# Patient Record
Sex: Female | Born: 1945 | Race: White | Hispanic: No | Marital: Single | Smoking: Former smoker
Health system: Southern US, Community
[De-identification: ages and names within clinical notes are randomized; demographics above are authoritative.]

## PROBLEM LIST (undated history)

## (undated) DIAGNOSIS — E119 Type 2 diabetes mellitus without complications: Secondary | ICD-10-CM

## (undated) DIAGNOSIS — D3500 Benign neoplasm of unspecified adrenal gland: Secondary | ICD-10-CM

## (undated) DIAGNOSIS — G8929 Other chronic pain: Secondary | ICD-10-CM

## (undated) DIAGNOSIS — I2510 Atherosclerotic heart disease of native coronary artery without angina pectoris: Secondary | ICD-10-CM

## (undated) DIAGNOSIS — G2581 Restless legs syndrome: Secondary | ICD-10-CM

## (undated) DIAGNOSIS — I878 Other specified disorders of veins: Secondary | ICD-10-CM

## (undated) DIAGNOSIS — I10 Essential (primary) hypertension: Secondary | ICD-10-CM

## (undated) DIAGNOSIS — E1169 Type 2 diabetes mellitus with other specified complication: Secondary | ICD-10-CM

## (undated) DIAGNOSIS — N1832 Chronic kidney disease, stage 3b: Secondary | ICD-10-CM

## (undated) DIAGNOSIS — I5032 Chronic diastolic (congestive) heart failure: Secondary | ICD-10-CM

## (undated) DIAGNOSIS — G4733 Obstructive sleep apnea (adult) (pediatric): Secondary | ICD-10-CM

## (undated) DIAGNOSIS — D509 Iron deficiency anemia, unspecified: Secondary | ICD-10-CM

## (undated) DIAGNOSIS — D638 Anemia in other chronic diseases classified elsewhere: Secondary | ICD-10-CM

## (undated) DIAGNOSIS — I509 Heart failure, unspecified: Secondary | ICD-10-CM

## (undated) DIAGNOSIS — E785 Hyperlipidemia, unspecified: Secondary | ICD-10-CM

## (undated) DIAGNOSIS — I4819 Other persistent atrial fibrillation: Secondary | ICD-10-CM

## (undated) HISTORY — PX: CARDIAC CATHETERIZATION: SHX172

## (undated) HISTORY — DX: Essential (primary) hypertension: I10

## (undated) HISTORY — PX: OTHER SURGICAL HISTORY: SHX169

## (undated) HISTORY — DX: Obstructive sleep apnea (adult) (pediatric): G47.33

## (undated) HISTORY — PX: TONSILLECTOMY: SUR1361

## (undated) HISTORY — DX: Hyperlipidemia, unspecified: E78.5

## (undated) HISTORY — DX: Type 2 diabetes mellitus without complications: E11.9

## (undated) HISTORY — DX: Atherosclerotic heart disease of native coronary artery without angina pectoris: I25.10

---

## 2006-05-31 DIAGNOSIS — R079 Chest pain, unspecified: Secondary | ICD-10-CM | POA: Insufficient documentation

## 2010-12-03 DIAGNOSIS — E785 Hyperlipidemia, unspecified: Secondary | ICD-10-CM | POA: Insufficient documentation

## 2010-12-03 DIAGNOSIS — R32 Unspecified urinary incontinence: Secondary | ICD-10-CM | POA: Insufficient documentation

## 2010-12-03 DIAGNOSIS — I10 Essential (primary) hypertension: Secondary | ICD-10-CM | POA: Insufficient documentation

## 2010-12-05 DIAGNOSIS — I2510 Atherosclerotic heart disease of native coronary artery without angina pectoris: Secondary | ICD-10-CM | POA: Insufficient documentation

## 2011-12-28 DIAGNOSIS — R42 Dizziness and giddiness: Secondary | ICD-10-CM | POA: Insufficient documentation

## 2014-03-05 DIAGNOSIS — E11649 Type 2 diabetes mellitus with hypoglycemia without coma: Secondary | ICD-10-CM | POA: Insufficient documentation

## 2014-03-05 DIAGNOSIS — E11610 Type 2 diabetes mellitus with diabetic neuropathic arthropathy: Secondary | ICD-10-CM | POA: Insufficient documentation

## 2017-03-09 DIAGNOSIS — I5033 Acute on chronic diastolic (congestive) heart failure: Secondary | ICD-10-CM | POA: Insufficient documentation

## 2017-03-09 DIAGNOSIS — I5032 Chronic diastolic (congestive) heart failure: Secondary | ICD-10-CM | POA: Insufficient documentation

## 2017-03-22 DIAGNOSIS — G459 Transient cerebral ischemic attack, unspecified: Secondary | ICD-10-CM | POA: Insufficient documentation

## 2017-04-06 DIAGNOSIS — L4050 Arthropathic psoriasis, unspecified: Secondary | ICD-10-CM | POA: Insufficient documentation

## 2019-06-19 DIAGNOSIS — R269 Unspecified abnormalities of gait and mobility: Secondary | ICD-10-CM | POA: Insufficient documentation

## 2019-06-20 DIAGNOSIS — M159 Polyosteoarthritis, unspecified: Secondary | ICD-10-CM | POA: Insufficient documentation

## 2019-06-20 DIAGNOSIS — M8949 Other hypertrophic osteoarthropathy, multiple sites: Secondary | ICD-10-CM | POA: Insufficient documentation

## 2019-08-14 NOTE — Progress Notes (Signed)
Cardiology Office Note:   Date:  08/16/2019  NAME:  [name]    MRN: [id number] DOB:  March 16, 1945   PCP:  Lajean Manes, MD  Cardiologist:  No primary care provider on file.   Referring MD: Lajean Manes, MD   Chief Complaint  Patient presents with  . Coronary Artery Disease   History of Present Illness:   [name] is a 74 y.o. female with a hx of CAD (RCA CTO), HTN, COPD, HLD, DM who is being seen today for the evaluation of CAD at the request of [place], Christiane Ha, MD.  She is recently relocated from [facility] to [place].  She apparently has found an assisted living facility that she can afford associated with the [facility] [place].  I did review her records extensively from [facility].  She has a history of a heart attack in the 90s and had angioplasty then.  She apparently is been followed by [facility] physicians and has been noted to have a CTO of the right coronary artery.  She has good left-to-right collateral flow on recent angiogram from 2015.  She has been relatively stable on medical therapy.  She is allergic to nitroglycerin per her report.  She remains on aspirin and her most recent LDL cholesterol from last year was within normal limits.  She reports over the last 2 to 3 weeks has had progressively worsening lower extremity edema and associated shortness of breath.  She apparently has had her Lasix increased to 40 mg twice daily by her primary care physician without resolution of symptoms.  This is not cutting it.  She also reports intermittent episodes of chest pain that occur infrequently.  She reports she gets a sharp pain in the center of her chest that can last seconds to minutes and resolves with Tylenol.  She reports that she is working with physical therapy 3 days/week.  This includes 1 hour sessions of walking and lifting weights.  She is able to do this without any major limitations.  It does appear that she has been a bit more short of breath with  lower extremity edema as of lately.  She is a former smoker.  CVD risk factors include diabetes, morbid obesity (BMI 57), OSA, hypertension, former tobacco abuse.  She also has a family history of heart disease in her father.  I did review her angiogram from 2015 which is a CTO of the RCA available through care everywhere.  She also had an echocardiogram with EF 50-55%.  Problem List 1. Pheochromocytoma s/p resection  2. CAD -angioplasty in 1992 -LHC 2015-> CTO of RCA with L to R collaterals -normal left system -Echo 07/19/2018: EF 50-55% 3. DM -A1c 6.4 4. HLD -T chol 158, TG 417, HDL 40, LDL 55 5. COPD 6. OSA on CPAP 7.  Obesity -BMI 79   Past Medical History: Past Medical History:  Diagnosis Date  . Coronary artery disease   . Diabetes mellitus without complication ([place])   . Hyperlipidemia   . Hypertension   . OSA (obstructive sleep apnea)     Past Surgical History: Past Surgical History:  Procedure Laterality Date  . CARDIAC CATHETERIZATION    . pheochromocytoma    . TONSILLECTOMY      Current Medications: Current Meds  Medication Sig  . acetaminophen (TYLENOL) 500 MG tablet Take 500 mg by mouth every 8 (eight) hours as needed.  Marland Kitchen albuterol (VENTOLIN HFA) 108 (90 Base) MCG/ACT inhaler Inhale 2 puffs into the lungs 4 (  four) times daily as needed for wheezing or shortness of breath.  Marland Kitchen amLODipine (NORVASC) 5 MG tablet Take 5 mg by mouth daily.  Marland Kitchen ascorbic acid (VITAMIN C) 1000 MG tablet Take 1,000 mg by mouth daily.  Marland Kitchen aspirin EC 81 MG tablet Take 81 mg by mouth daily. Swallow whole.  . budesonide-formoterol (SYMBICORT) 160-4.5 MCG/ACT inhaler Inhale 2 puffs into the lungs 2 (two) times daily.  . carvedilol (COREG) 25 MG tablet Take 25 mg by mouth 2 (two) times daily with a meal.  . Cholecalciferol (VITAMIN D3) 125 MCG (5000 UT) CAPS Take 5,000 Units by mouth.  . diclofenac Sodium (VOLTAREN) 1 % GEL Apply topically 4 (four) times daily. Apply 2 grams to affected area up  to two times a day  . fluocinonide (LIDEX) 0.05 % external solution Apply 1 application topically 2 (two) times daily. Apply to affected area twice a day  . folic acid (FOLVITE) 1 MG tablet Take 1 mg by mouth daily.  . furosemide (LASIX) 40 MG tablet Take 40 mg by mouth 2 (two) times daily.  Marland Kitchen glucose 4 GM chewable tablet Chew 3 tablets by mouth as needed for low blood sugar.  Marland Kitchen glucose blood test strip as directed. Dispense Blood Glucose Test Strips to patient for home use. Testing 4 times daily.  . insulin NPH-regular Human (70-30) 100 UNIT/ML injection Inject into the skin. Give 60 units subcutaneous twice daily with breakfast and lunch and 50 units at supper  . Insulin Syringe-Needle U-100 (INSULIN SYRINGE 1CC/31GX5/16") 31G X 5/16" 1 ML MISC USE TO INJECT INSULIN TWO TIMES A DAY  . Ixekizumab 80 MG/ML SOAJ Inject into the skin. Give subcutaneous every 4 weeks  . Lancets 33G MISC as directed. Testing 4 times daily.  Dispense Lancets to patient for home use.  . lisinopril (ZESTRIL) 10 MG tablet Take 10 mg by mouth daily.  . Magnesium 200 MG TABS Take 200 mg by mouth daily.  . meloxicam (MOBIC) 7.5 MG tablet Take 7.5 mg by mouth in the morning and at bedtime.  . methotrexate (RHEUMATREX) 2.5 MG tablet Take 15 mg by mouth once a week. Caution:Chemotherapy. Protect from light.  . Multiple Vitamin (MULTIVITAMIN ADULT PO) Take by mouth.  . OXYGEN As directed  . polyethylene glycol (MIRALAX / GLYCOLAX) 17 g packet Take 17 g by mouth daily.  . ranolazine (RANEXA) 1000 MG SR tablet Take 500 mg by mouth 2 (two) times daily.  Marland Kitchen Respiratory Therapy Supplies DEVI as directed. BIPAP 18/14 cm H2O with humidifier, mask, tubing, and headgear.  . rosuvastatin (CRESTOR) 20 MG tablet Take 20 mg by mouth daily.  Marland Kitchen tiotropium (SPIRIVA HANDIHALER) 18 MCG inhalation capsule Place 18 mcg into inhaler and inhale daily.  . Turmeric 500 MG CAPS Take by mouth.  . vitamin B-12 (CYANOCOBALAMIN) 1000 MCG tablet Take 1,000  mcg by mouth daily.  Marland Kitchen zinc sulfate 50 MG CAPS capsule Take 220 mg by mouth daily.     Allergies:    Metaxalone, Nitroglycerin, Tramadol-acetaminophen, Brompheniramine-pseudoeph, Meprobamate, Metformin, Methyldopa, Metoprolol, Procaine, and Pseudoephedrine hcl   Social History: Social History   Socioeconomic History  . Marital status: Widowed    Spouse name: Not on file  . Number of children: Not on file  . Years of education: Not on file  . Highest education level: Not on file  Occupational History  . Occupation: retired  Tobacco Use  . Smoking status: Former Smoker    Packs/day: 2.00    Years: 10.00  Pack years: 20.00    Quit date: 08/15/1989    Years since quitting: 30.0  . Smokeless tobacco: Never Used  Substance and Sexual Activity  . Alcohol use: Not Currently  . Drug use: Not Currently  . Sexual activity: Not on file  Other Topics Concern  . Not on file  Social History Narrative  . Not on file   Social Determinants of Health   Financial Resource Strain:   . Difficulty of Paying Living Expenses:   Food Insecurity:   . Worried About Charity fundraiser in the Last Year:   . Arboriculturist in the Last Year:   Transportation Needs:   . Film/video editor (Medical):   Marland Kitchen Lack of Transportation (Non-Medical):   Physical Activity:   . Days of Exercise per Week:   . Minutes of Exercise per Session:   Stress:   . Feeling of Stress :   Social Connections:   . Frequency of Communication with Friends and Family:   . Frequency of Social Gatherings with Friends and Family:   . Attends Religious Services:   . Active Member of Clubs or Organizations:   . Attends Archivist Meetings:   Marland Kitchen Marital Status:      Family History: The patient's family history includes Heart attack in her father and mother; Stroke in her mother.  ROS:   All other ROS reviewed and negative. Pertinent positives noted in the HPI.     EKGs/Labs/Other Studies Reviewed:   The  following studies were personally reviewed by me today:  EKG:  EKG is ordered today.  The ekg ordered today demonstrates normal sinus rhythm, heart rate 69, old anterior infarct, and was personally reviewed by me.   Recent Labs: No results found for requested labs within last 8760 hours.   Recent Lipid Panel No results found for: CHOL, TRIG, HDL, CHOLHDL, VLDL, LDLCALC, LDLDIRECT  Physical Exam:   VS:  BP (!) 132/74   Pulse 69   Ht 5\' 3"  (1.6 m)   Wt (!) 321 lb 12.8 oz (146 kg)   BMI 57.00 kg/m    Wt Readings from Last 3 Encounters:  08/16/19 (!) 321 lb 12.8 oz (146 kg)    General: Morbidly obese female, no acute distress Heart: Atraumatic, normal size  Eyes: PEERLA, EOMI  Neck: Supple, neck adiposity precludes JVD assessment Endocrine: No thryomegaly Cardiac: Normal S1, S2; RRR; no murmurs, rubs, or gallops Lungs: Clear to auscultation bilaterally, no wheezing, rhonchi or rales  Abd: Soft, nontender, no hepatomegaly  Ext: 2+ pitting edema up to knees Musculoskeletal: No deformities, BUE and BLE strength normal and equal Skin: Warm and dry, no rashes   Neuro: Alert and oriented to person, place, time, and situation, CNII-XII grossly intact, no focal deficits  Psych: Normal mood and affect   ASSESSMENT:   [name] is a 73 y.o. female who presents for the following: 1. Coronary artery disease involving native coronary artery of native heart without angina pectoris   2. Mixed hyperlipidemia   3. Essential hypertension   4. SOB (shortness of breath)   5. Leg edema     PLAN:   1. Coronary artery disease involving native coronary artery of native heart without angina pectoris -History of MI in the 90s status post angioplasty.  Most recent left heart catheterization in 2015 in [place] demonstrates RCA CTO with left-to-right collaterals.  This has been managed medically.  She presents with minimal symptoms of chest  pain.  She is intolerant to nitroglycerin.   We will continue her Ranexa 500 mg twice daily.  She is on aspirin 81 mg daily.  She is on Lipitor and most recent LDL cholesterol from last year showed a value of 55.  Continue this.  I suspect most of her symptoms are related to volume overload.  See discussion below.  2. Mixed hyperlipidemia -Continue statin.  3. Essential hypertension -Continue Coreg, lisinopril.  I will stop her amlodipine.  This will worsen her edema.  4. SOB (shortness of breath) 5. Leg edema -She has worsening lower extremity edema.  Will need repeat echocardiogram.  We will check a BNP as well.  She needs a CBC and CMP.  Highly suspect she has either diastolic heart failure or reduction in EF.  We will need to reevaluate with an echocardiogram.  I would also like to switch her over to torsemide.  She has more right heart failure symptoms and this will be better absorbed.  We will start her on 50 mg of torsemide daily.  We did discuss extensively salt reductive strategies.  She does live in an assisted living facility and is unable to take much of the food she eats.  Disposition: Return in about 3 weeks (around 09/06/2019).  Medication Adjustments/Labs and Tests Ordered: Current medicines are reviewed at length with the patient today.  Concerns regarding medicines are outlined above.  Orders Placed This Encounter  Procedures  . Brain natriuretic peptide  . Comprehensive metabolic panel  . CBC  . EKG 12-Lead  . ECHOCARDIOGRAM COMPLETE   Meds ordered this encounter  Medications  . torsemide (DEMADEX) 100 MG tablet    Sig: Take 0.5 tablets (50 mg total) by mouth daily.    Dispense:  60 tablet    Refill:  1    Patient Instructions  Medication Instructions:  Stop Lasix Stop Amlodipine  Start Torsemide 50 mg daily   *If you need a refill on your cardiac medications before your next appointment, please call your pharmacy*   Lab Work: BNP, CMET, CBC If you have labs (blood work) drawn today and your tests  are completely normal, you will receive your results only by: Marland Kitchen MyChart Message (if you have MyChart) OR . A paper copy in the mail If you have any lab test that is abnormal or we need to change your treatment, we will call you to review the results.   Testing/Procedures: Echocardiogram - Your physician has requested that you have an echocardiogram. Echocardiography is a painless test that uses sound waves to create images of your heart. It provides your doctor with information about the size and shape of your heart and how well your heart's chambers and valves are working. This procedure takes approximately one hour. There are no restrictions for this procedure. This will be performed at our [facility] location - [address].    Follow-Up: At [facility], you and your health needs are our priority.  As part of our continuing mission to provide you with exceptional heart care, we have created designated Provider Care Teams.  These Care Teams include your primary Cardiologist (physician) and Advanced Practice Providers (APPs -  Physician Assistants and Nurse Practitioners) who all work together to provide you with the care you need, when you need it.  We recommend signing up for the patient portal called "MyChart".  Sign up information is provided on this After Visit Summary.  MyChart is used to connect with  patients for Virtual Visits (Telemedicine).  Patients are able to view lab/test results, encounter notes, upcoming appointments, etc.  Non-urgent messages can be sent to your provider as well.   To learn more about what you can do with MyChart, go to [email or web address].    Your next appointment:   2-3 week(s)  The format for your next appointment:   In Person  Provider:   Eleonore Chiquito, MD           Signed, Addison Naegeli. Audie Box, [place]  [address], [place], [place] [address] [phone]  08/16/2019 2:13 PM

## 2019-08-16 ENCOUNTER — Ambulatory Visit: Payer: Medicare Other | Admitting: Cardiovascular Disease

## 2019-08-16 ENCOUNTER — Ambulatory Visit (INDEPENDENT_AMBULATORY_CARE_PROVIDER_SITE_OTHER): Payer: Medicare Other | Admitting: Cardiovascular Disease

## 2019-08-16 ENCOUNTER — Other Ambulatory Visit: Payer: Self-pay

## 2019-08-16 ENCOUNTER — Encounter: Payer: Self-pay | Admitting: Cardiovascular Disease

## 2019-08-16 VITALS — BP 132/74 | HR 69 | Ht 63.0 in | Wt 321.8 lb

## 2019-08-16 DIAGNOSIS — I10 Essential (primary) hypertension: Secondary | ICD-10-CM

## 2019-08-16 DIAGNOSIS — R600 Localized edema: Secondary | ICD-10-CM

## 2019-08-16 DIAGNOSIS — R0602 Shortness of breath: Secondary | ICD-10-CM

## 2019-08-16 DIAGNOSIS — I2510 Atherosclerotic heart disease of native coronary artery without angina pectoris: Secondary | ICD-10-CM | POA: Diagnosis not present

## 2019-08-16 DIAGNOSIS — E782 Mixed hyperlipidemia: Secondary | ICD-10-CM

## 2019-08-16 MED ORDER — TORSEMIDE 100 MG PO TABS
50.0000 mg | ORAL_TABLET | Freq: Every day | ORAL | 1 refills | Status: DC
Start: 2019-08-16 — End: 2020-01-29

## 2019-08-16 NOTE — Patient Instructions (Signed)
Medication Instructions:  Stop Lasix Stop Amlodipine  Start Torsemide 50 mg daily   *If you need a refill on your cardiac medications before your next appointment, please call your pharmacy*   Lab Work: BNP, CMET, CBC If you have labs (blood work) drawn today and your tests are completely normal, you will receive your results only by: Marland Kitchen MyChart Message (if you have MyChart) OR . A paper copy in the mail If you have any lab test that is abnormal or we need to change your treatment, we will call you to review the results.   Testing/Procedures: Echocardiogram - Your physician has requested that you have an echocardiogram. Echocardiography is a painless test that uses sound waves to create images of your heart. It provides your doctor with information about the size and shape of your heart and how well your heart's chambers and valves are working. This procedure takes approximately one hour. There are no restrictions for this procedure. This will be performed at our [facility] location - [address].    Follow-Up: At [facility], you and your health needs are our priority.  As part of our continuing mission to provide you with exceptional heart care, we have created designated Provider Care Teams.  These Care Teams include your primary Cardiologist (physician) and Advanced Practice Providers (APPs -  Physician Assistants and Nurse Practitioners) who all work together to provide you with the care you need, when you need it.  We recommend signing up for the patient portal called "MyChart".  Sign up information is provided on this After Visit Summary.  MyChart is used to connect with patients for Virtual Visits (Telemedicine).  Patients are able to view lab/test results, encounter notes, upcoming appointments, etc.  Non-urgent messages can be sent to your provider as well.   To learn more about what you can do with MyChart, go to [email or web address].    Your next  appointment:   2-3 week(s)  The format for your next appointment:   In Person  Provider:   Eleonore Chiquito, MD

## 2019-08-17 LAB — COMPREHENSIVE METABOLIC PANEL
ALT: 19 IU/L (ref 0–32)
AST: 22 IU/L (ref 0–40)
Albumin/Globulin Ratio: 2.1 (ref 1.2–2.2)
Albumin: 4 g/dL (ref 3.7–4.7)
Alkaline Phosphatase: 61 IU/L (ref 48–121)
BUN/Creatinine Ratio: 20 (ref 12–28)
BUN: 15 mg/dL (ref 8–27)
Bilirubin Total: 0.3 mg/dL (ref 0.0–1.2)
CO2: 29 mmol/L (ref 20–29)
Calcium: 8.9 mg/dL (ref 8.7–10.3)
Chloride: 100 mmol/L (ref 96–106)
Creatinine, Ser: 0.76 mg/dL (ref 0.57–1.00)
GFR calc Af Amer: 91 mL/min/{1.73_m2} (ref >59)
GFR calc non Af Amer: 79 mL/min/{1.73_m2} (ref >59)
Globulin, Total: 1.9 g/dL (ref 1.5–4.5)
Glucose: 158 mg/dL — ABNORMAL HIGH (ref 65–99)
Potassium: 4 mmol/L (ref 3.5–5.2)
Sodium: 142 mmol/L (ref 134–144)
Total Protein: 5.9 g/dL — ABNORMAL LOW (ref 6.0–8.5)

## 2019-08-17 LAB — CBC
Hematocrit: 33.6 % — ABNORMAL LOW (ref 34.0–46.6)
Hemoglobin: 11.1 g/dL (ref 11.1–15.9)
MCH: 31.2 pg (ref 26.6–33.0)
MCHC: 33 g/dL (ref 31.5–35.7)
MCV: 94 fL (ref 79–97)
Platelets: 180 10*3/uL (ref 150–450)
RBC: 3.56 x10E6/uL — ABNORMAL LOW (ref 3.77–5.28)
RDW: 16.6 % — ABNORMAL HIGH (ref 11.7–15.4)
WBC: 6.1 10*3/uL (ref 3.4–10.8)

## 2019-08-17 LAB — BRAIN NATRIURETIC PEPTIDE: BNP: 46.8 pg/mL (ref 0.0–100.0)

## 2019-08-20 ENCOUNTER — Encounter: Payer: Self-pay | Admitting: Cardiovascular Disease

## 2019-09-05 ENCOUNTER — Ambulatory Visit (HOSPITAL_COMMUNITY): Payer: Medicare Other | Attending: Cardiovascular Disease

## 2019-09-05 ENCOUNTER — Other Ambulatory Visit: Payer: Self-pay

## 2019-09-05 DIAGNOSIS — R0602 Shortness of breath: Secondary | ICD-10-CM

## 2019-09-05 LAB — ECHOCARDIOGRAM COMPLETE
Area-P 1/2: 2.56 cm2
S' Lateral: 3.5 cm

## 2019-09-05 MED ORDER — PERFLUTREN LIPID MICROSPHERE
1.0000 mL | INTRAVENOUS | Status: AC | PRN
  Administered 2019-09-05: 2 mL via INTRAVENOUS

## 2019-10-01 NOTE — Progress Notes (Signed)
Cardiology Office Note:   Date:  10/03/2019  NAME:  [name]    MRN: [id number] DOB:  03-Dec-1945   PCP:  Lajean Manes, MD  Cardiologist:  No primary care provider on file.  [facility] life has a midline is all at Referring MD: Lajean Manes, MD   Chief Complaint  Patient presents with  . Congestive Heart Failure   History of Present Illness:   [name] is a 74 y.o. female with a hx of CAD, DM, HLD, COPD, OSA who presents for follow-up of diastolic HF. Recently evaluated for SOB. Echo with G2DD and dilated IVC. Started on torsemide 50 mg QD. BNP normal but likely low in setting of obesity.   She reports that her shortness of breath and chest pains have improved with torsemide.  Her weight is down to 317 pounds from 321 her last visit.  She still has some lower extremity edema but symptoms appear to have been improved with torsemide.  She is not exercising that much.  She is mainly active only to do laundry or get around her apartment.  She lives in assisted living.  Her blood pressure is well controlled today.  She denies any chest pain.  She is not pursuing physical therapy at this time.  This is needed.  Weight loss also was encouraged.  She is not taking Lasix and on torsemide.  Appears to be doing much better than when I saw her last visit.  Her triglycerides are elevated.  I have recommended treatment for this.  She will have repeat labs by her primary care physician in several weeks.  We do need to make sure they are fasting.  She overall still has some lower extreme edema but symptoms appear to be improving.  Problem List 1. Pheochromocytoma s/p resection  2. CAD -angioplasty in 1992 -[facility] 2015-> CTO of RCA with L to R collaterals -normal left system -Echo 07/19/2018: EF 50-55% -intolerant of NTG; on ranexa  3. DM -A1c 6.4 4. HLD -T chol 158, TG 417, HDL 40, LDL 55 5. COPD 6. OSA on CPAP 7.  Obesity -BMI 57 8. HFpEF -EF 50-55% with G2DD  Past Medical History: Past  Medical History:  Diagnosis Date  . Coronary artery disease   . Diabetes mellitus without complication ([place])   . Hyperlipidemia   . Hypertension   . OSA (obstructive sleep apnea)     Past Surgical History: Past Surgical History:  Procedure Laterality Date  . CARDIAC CATHETERIZATION    . pheochromocytoma    . TONSILLECTOMY      Current Medications: Current Meds  Medication Sig  . acetaminophen (TYLENOL) 500 MG tablet Take 500 mg by mouth every 8 (eight) hours as needed.  Marland Kitchen albuterol (VENTOLIN HFA) 108 (90 Base) MCG/ACT inhaler Inhale 2 puffs into the lungs 4 (four) times daily as needed for wheezing or shortness of breath.  Marland Kitchen ascorbic acid (VITAMIN C) 1000 MG tablet Take 1,000 mg by mouth daily.  Marland Kitchen aspirin EC 81 MG tablet Take 81 mg by mouth daily. Swallow whole.  . budesonide-formoterol (SYMBICORT) 160-4.5 MCG/ACT inhaler Inhale 2 puffs into the lungs 2 (two) times daily.  . carvedilol (COREG) 25 MG tablet Take 25 mg by mouth 2 (two) times daily with a meal.  . Cholecalciferol (VITAMIN D3) 125 MCG (5000 UT) CAPS Take 5,000 Units by mouth.  . diclofenac Sodium (VOLTAREN) 1 % GEL Apply topically 4 (four) times daily. Apply 2 grams to affected area up to two times a  day  . fluocinonide (LIDEX) 0.05 % external solution Apply 1 application topically 2 (two) times daily. Apply to affected area twice a day  . folic acid (FOLVITE) 1 MG tablet Take 1 mg by mouth daily.  Marland Kitchen glucose 4 GM chewable tablet Chew 3 tablets by mouth as needed for low blood sugar.  Marland Kitchen glucose blood test strip as directed. Dispense Blood Glucose Test Strips to patient for home use. Testing 4 times daily.  . insulin NPH-regular Human (70-30) 100 UNIT/ML injection Inject into the skin. Give 60 units subcutaneous twice daily with breakfast and lunch and 50 units at supper  . Insulin Syringe-Needle U-100 (INSULIN SYRINGE 1CC/31GX5/16") 31G X 5/16" 1 ML MISC USE TO INJECT INSULIN TWO TIMES A DAY  . Ixekizumab 80 MG/ML SOAJ  Inject into the skin. Give subcutaneous every 4 weeks  . Lancets 33G MISC as directed. Testing 4 times daily.  Dispense Lancets to patient for home use.  . lisinopril (ZESTRIL) 10 MG tablet Take 10 mg by mouth daily.  . Magnesium 200 MG TABS Take 200 mg by mouth daily.  . meloxicam (MOBIC) 7.5 MG tablet Take 7.5 mg by mouth in the morning and at bedtime.  . Multiple Vitamin (MULTIVITAMIN ADULT PO) Take by mouth.  . polyethylene glycol (MIRALAX / GLYCOLAX) 17 g packet Take 17 g by mouth daily.  . ranolazine (RANEXA) 1000 MG SR tablet Take 500 mg by mouth 2 (two) times daily.  Marland Kitchen Respiratory Therapy Supplies DEVI as directed. BIPAP 18/14 cm H2O with humidifier, mask, tubing, and headgear.  . rosuvastatin (CRESTOR) 20 MG tablet Take 20 mg by mouth daily.  Marland Kitchen tiotropium (SPIRIVA HANDIHALER) 18 MCG inhalation capsule Place 18 mcg into inhaler and inhale daily.  Marland Kitchen torsemide (DEMADEX) 100 MG tablet Take 0.5 tablets (50 mg total) by mouth daily.  . Turmeric 500 MG CAPS Take by mouth.  . vitamin B-12 (CYANOCOBALAMIN) 1000 MCG tablet Take 1,000 mcg by mouth daily.  Marland Kitchen zinc sulfate 50 MG CAPS capsule Take 220 mg by mouth daily.     Allergies:    Metaxalone, Nitroglycerin, Tramadol-acetaminophen, Brompheniramine-pseudoeph, Meprobamate, Metformin, Methyldopa, Metoprolol, Procaine, and Pseudoephedrine hcl   Social History: Social History   Socioeconomic History  . Marital status: Widowed    Spouse name: Not on file  . Number of children: Not on file  . Years of education: Not on file  . Highest education level: Not on file  Occupational History  . Occupation: retired  Tobacco Use  . Smoking status: Former Smoker    Packs/day: 2.00    Years: 10.00    Pack years: 20.00    Quit date: 08/15/1989    Years since quitting: 30.1  . Smokeless tobacco: Never Used  Substance and Sexual Activity  . Alcohol use: Not Currently  . Drug use: Not Currently  . Sexual activity: Not on file  Other Topics  Concern  . Not on file  Social History Narrative  . Not on file   Social Determinants of Health   Financial Resource Strain:   . Difficulty of Paying Living Expenses: Not on file  Food Insecurity:   . Worried About Charity fundraiser in the Last Year: Not on file  . Ran Out of Food in the Last Year: Not on file  Transportation Needs:   . Lack of Transportation (Medical): Not on file  . Lack of Transportation (Non-Medical): Not on file  Physical Activity:   . Days of Exercise per Week: Not on  file  . Minutes of Exercise per Session: Not on file  Stress:   . Feeling of Stress : Not on file  Social Connections:   . Frequency of Communication with Friends and Family: Not on file  . Frequency of Social Gatherings with Friends and Family: Not on file  . Attends Religious Services: Not on file  . Active Member of Clubs or Organizations: Not on file  . Attends Archivist Meetings: Not on file  . Marital Status: Not on file     Family History: The patient's family history includes Heart attack in her father and mother; Stroke in her mother.  ROS:   All other ROS reviewed and negative. Pertinent positives noted in the HPI.     EKGs/Labs/Other Studies Reviewed:   The following studies were personally reviewed by me today:  TTE 09/05/2019 1. Hypokinesis of the mid to apical inferior and inferolateral  myocardium. Left ventricular ejection fraction, by estimation, is 50 to  55%. The left ventricle has low normal function. The left ventricle has no  regional wall motion abnormalities. Left  ventricular diastolic parameters are consistent with Grade II diastolic  dysfunction (pseudonormalization). Elevated left ventricular end-diastolic  pressure.  2. Right ventricular systolic function is normal. The right ventricular  size is normal.  3. The mitral valve is normal in structure. Trivial mitral valve  regurgitation. No evidence of mitral stenosis.  4. The aortic valve  was not well visualized. Aortic valve regurgitation  is trivial. No aortic stenosis is present.  5. The inferior vena cava is dilated in size with <50% respiratory  variability, suggesting right atrial pressure of 15 mmHg.   Recent Labs: 08/16/2019: ALT 19; BNP 46.8; BUN 15; Creatinine, Ser 0.76; Hemoglobin 11.1; Platelets 180; Potassium 4.0; Sodium 142   Recent Lipid Panel No results found for: CHOL, TRIG, HDL, CHOLHDL, VLDL, LDLCALC, LDLDIRECT  Physical Exam:   VS:  BP 120/66   Pulse 74   Ht 5\' 3"  (1.6 m)   Wt (!) 317 lb (143.8 kg)   BMI 56.15 kg/m    Wt Readings from Last 3 Encounters:  10/03/19 (!) 317 lb (143.8 kg)  08/16/19 (!) 321 lb 12.8 oz (146 kg)    General: Well nourished, well developed, in no acute distress Heart: Atraumatic, normal size  Eyes: PEERLA, EOMI  Neck: Supple, no JVD Endocrine: No thryomegaly Cardiac: Normal S1, S2; RRR; no murmurs, rubs, or gallops Lungs: Clear to auscultation bilaterally, no wheezing, rhonchi or rales  Abd: Soft, nontender, no hepatomegaly  Ext: 1+ pitting edema, chronic venous insufficiency changes noted Musculoskeletal: No deformities, BUE and BLE strength normal and equal Skin: Warm and dry, no rashes   Neuro: Alert and oriented to person, place, time, and situation, CNII-XII grossly intact, no focal deficits  Psych: Normal mood and affect   ASSESSMENT:   [name] is a 73 y.o. female who presents for the following: 1. Chronic diastolic heart failure ([place])   2. Coronary artery disease involving native coronary artery of native heart without angina pectoris   3. Mixed hyperlipidemia   4. Essential hypertension     PLAN:   1. Chronic diastolic heart failure ([facility]) -Recently seen with symptoms of diastolic heart failure.  Echo does confirm this.  Symptoms have improved on torsemide 50 mg daily.  Weights are down 4 pounds.  I would recommend continue torsemide 50 mg daily. -We did talk about exercise importance as well as  the importance of proper diet.  She  should avoid any salt.  This is problematic at her facility.  She will do what she can. -Other risk factors seem to be well controlled.  2. Coronary artery disease involving native coronary artery of native heart without angina pectoris -She has a CTO of the RCA.  Most recent heart cath in 2015 at [facility]  She is intolerant of nitroglycerin.  She did have some chest pain when I saw her last but I suspect this was volume overload related.  Symptoms have improved with diuresis.  She will continue Ranexa and diuresis.  She is also on Norvasc and other antianginals.  She is intolerant of statins.  3. Mixed hyperlipidemia -Elevated triglycerides.  We will start Vascepa 2 g twice daily.  She will continue Crestor 20 mg daily.  She will have her labs checked by her primary care physician.  I will see her back in 6 months we will reassess.  4. Essential hypertension -Well-controlled.  Continue amlodipine, carvedilol.   Disposition: Return in about 6 months (around 04/01/2020).  Medication Adjustments/Labs and Tests Ordered: Current medicines are reviewed at length with the patient today.  Concerns regarding medicines are outlined above.  No orders of the defined types were placed in this encounter.  Meds ordered this encounter  Medications  . icosapent Ethyl (VASCEPA) 1 g capsule    Sig: Take 2 capsules (2 g total) by mouth 2 (two) times daily.    Dispense:  360 capsule    Refill:  1    Patient Instructions  Medication Instructions:  Start Vascepa 2 g twice daily   *If you need a refill on your cardiac medications before your next appointment, please call your pharmacy*   Follow-Up: At [facility], you and your health needs are our priority.  As part of our continuing mission to provide you with exceptional heart care, we have created designated Provider Care Teams.  These Care Teams include your primary Cardiologist (physician) and  Advanced Practice Providers (APPs -  Physician Assistants and Nurse Practitioners) who all work together to provide you with the care you need, when you need it.  We recommend signing up for the patient portal called "MyChart".  Sign up information is provided on this After Visit Summary.  MyChart is used to connect with patients for Virtual Visits (Telemedicine).  Patients are able to view lab/test results, encounter notes, upcoming appointments, etc.  Non-urgent messages can be sent to your provider as well.   To learn more about what you can do with MyChart, go to [email or web address].    Your next appointment:   6 month(s)  The format for your next appointment:   In Person  Provider:   Eleonore Chiquito, MD        Time Spent with Patient: I have spent a total of 35 minutes with patient reviewing hospital notes, telemetry, EKGs, labs and examining the patient as well as establishing an assessment and plan that was discussed with the patient.  > 50% of time was spent in direct patient care.  Signed, Addison Naegeli. Audie Box, [place]  [address] [place], [place] [address] [phone]  10/03/2019 11:25 AM

## 2019-10-03 ENCOUNTER — Encounter: Payer: Self-pay | Admitting: Cardiovascular Disease

## 2019-10-03 ENCOUNTER — Other Ambulatory Visit: Payer: Self-pay

## 2019-10-03 ENCOUNTER — Ambulatory Visit (INDEPENDENT_AMBULATORY_CARE_PROVIDER_SITE_OTHER): Payer: Medicare Other | Admitting: Cardiovascular Disease

## 2019-10-03 VITALS — BP 120/66 | HR 74 | Ht 63.0 in | Wt 317.0 lb

## 2019-10-03 DIAGNOSIS — I2510 Atherosclerotic heart disease of native coronary artery without angina pectoris: Secondary | ICD-10-CM

## 2019-10-03 DIAGNOSIS — I10 Essential (primary) hypertension: Secondary | ICD-10-CM | POA: Diagnosis not present

## 2019-10-03 DIAGNOSIS — I5032 Chronic diastolic (congestive) heart failure: Secondary | ICD-10-CM | POA: Diagnosis not present

## 2019-10-03 DIAGNOSIS — E782 Mixed hyperlipidemia: Secondary | ICD-10-CM | POA: Diagnosis not present

## 2019-10-03 MED ORDER — ICOSAPENT ETHYL 1 G PO CAPS
2.0000 g | ORAL_CAPSULE | Freq: Two times a day (BID) | ORAL | 1 refills | Status: DC
Start: 2019-10-03 — End: 2019-12-07

## 2019-10-03 NOTE — Patient Instructions (Signed)
Medication Instructions:  Start Vascepa 2 g twice daily   *If you need a refill on your cardiac medications before your next appointment, please call your pharmacy*   Follow-Up: At [facility], you and your health needs are our priority.  As part of our continuing mission to provide you with exceptional heart care, we have created designated Provider Care Teams.  These Care Teams include your primary Cardiologist (physician) and Advanced Practice Providers (APPs -  Physician Assistants and Nurse Practitioners) who all work together to provide you with the care you need, when you need it.  We recommend signing up for the patient portal called "MyChart".  Sign up information is provided on this After Visit Summary.  MyChart is used to connect with patients for Virtual Visits (Telemedicine).  Patients are able to view lab/test results, encounter notes, upcoming appointments, etc.  Non-urgent messages can be sent to your provider as well.   To learn more about what you can do with MyChart, go to [email or web address].    Your next appointment:   6 month(s)  The format for your next appointment:   In Person  Provider:   Eleonore Chiquito, MD

## 2019-11-30 ENCOUNTER — Ambulatory Visit: Payer: Medicare Other | Admitting: Podiatry

## 2019-12-07 ENCOUNTER — Ambulatory Visit (INDEPENDENT_AMBULATORY_CARE_PROVIDER_SITE_OTHER): Payer: Medicare Other | Admitting: Podiatry

## 2019-12-07 ENCOUNTER — Encounter: Payer: Self-pay | Admitting: Podiatry

## 2019-12-07 ENCOUNTER — Other Ambulatory Visit: Payer: Self-pay

## 2019-12-07 DIAGNOSIS — M79674 Pain in right toe(s): Secondary | ICD-10-CM

## 2019-12-07 DIAGNOSIS — B351 Tinea unguium: Secondary | ICD-10-CM | POA: Diagnosis not present

## 2019-12-07 DIAGNOSIS — M5010 Cervical disc disorder with radiculopathy, unspecified cervical region: Secondary | ICD-10-CM | POA: Insufficient documentation

## 2019-12-07 DIAGNOSIS — Z86018 Personal history of other benign neoplasm: Secondary | ICD-10-CM | POA: Insufficient documentation

## 2019-12-07 DIAGNOSIS — E1142 Type 2 diabetes mellitus with diabetic polyneuropathy: Secondary | ICD-10-CM | POA: Diagnosis not present

## 2019-12-07 DIAGNOSIS — G4733 Obstructive sleep apnea (adult) (pediatric): Secondary | ICD-10-CM | POA: Insufficient documentation

## 2019-12-07 DIAGNOSIS — M79675 Pain in left toe(s): Secondary | ICD-10-CM

## 2019-12-07 DIAGNOSIS — I2510 Atherosclerotic heart disease of native coronary artery without angina pectoris: Secondary | ICD-10-CM

## 2019-12-07 DIAGNOSIS — H5089 Other specified strabismus: Secondary | ICD-10-CM | POA: Insufficient documentation

## 2019-12-07 NOTE — Patient Instructions (Addendum)
Moisturize feet once daily; do not apply between toes: A.  Aquaphor Healing Ointment B.  Vaseline Intensive Care Lotion C.  Lubriderm Lotion D.  Gold Bond Diabetic Foot Lotion E.  Eucerin Intensive Repair Moisturizing Lotion  If you have problems reaching your feet:  A.  Aquaphor Advanced Therapy Ointment Body Spray B.  Vaseline Intensive Care Spray Lotion Advanced Repair      Diabetes Mellitus and Foot Care Foot care is an important part of your health, especially when you have diabetes. Diabetes may cause you to have problems because of poor blood flow (circulation) to your feet and legs, which can cause your skin to:  Become thinner and drier.  Break more easily.  Heal more slowly.  Peel and crack. You may also have nerve damage (neuropathy) in your legs and feet, causing decreased feeling in them. This means that you may not notice minor injuries to your feet that could lead to more serious problems. Noticing and addressing any potential problems early is the best way to prevent future foot problems. How to care for your feet Foot hygiene  Wash your feet daily with warm water and mild soap. Do not use hot water. Then, pat your feet and the areas between your toes until they are completely dry. Do not soak your feet as this can dry your skin.  Trim your toenails straight across. Do not dig under them or around the cuticle. File the edges of your nails with an emery board or nail file.  Apply a moisturizing lotion or petroleum jelly to the skin on your feet and to dry, brittle toenails. Use lotion that does not contain alcohol and is unscented. Do not apply lotion between your toes. Shoes and socks  Wear clean socks or stockings every day. Make sure they are not too tight. Do not wear knee-high stockings since they may decrease blood flow to your legs.  Wear shoes that fit properly and have enough cushioning. Always look in your shoes before you put them on to be sure there  are no objects inside.  To break in new shoes, wear them for just a few hours a day. This prevents injuries on your feet. Wounds, scrapes, corns, and calluses  Check your feet daily for blisters, cuts, bruises, sores, and redness. If you cannot see the bottom of your feet, use a mirror or ask someone for help.  Do not cut corns or calluses or try to remove them with medicine.  If you find a minor scrape, cut, or break in the skin on your feet, keep it and the skin around it clean and dry. You may clean these areas with mild soap and water. Do not clean the area with peroxide, alcohol, or iodine.  If you have a wound, scrape, corn, or callus on your foot, look at it several times a day to make sure it is healing and not infected. Check for: ? Redness, swelling, or pain. ? Fluid or blood. ? Warmth. ? Pus or a bad smell. General instructions  Do not cross your legs. This may decrease blood flow to your feet.  Do not use heating pads or hot water bottles on your feet. They may burn your skin. If you have lost feeling in your feet or legs, you may not know this is happening until it is too late.  Protect your feet from hot and cold by wearing shoes, such as at the beach or on hot pavement.  Schedule a complete foot   exam at least once a year (annually) or more often if you have foot problems. If you have foot problems, report any cuts, sores, or bruises to your health care provider immediately. Contact a health care provider if:  You have a medical condition that increases your risk of infection and you have any cuts, sores, or bruises on your feet.  You have an injury that is not healing.  You have redness on your legs or feet.  You feel burning or tingling in your legs or feet.  You have pain or cramps in your legs and feet.  Your legs or feet are numb.  Your feet always feel cold.  You have pain around a toenail. Get help right away if:  You have a wound, scrape, corn, or  callus on your foot and: ? You have pain, swelling, or redness that gets worse. ? You have fluid or blood coming from the wound, scrape, corn, or callus. ? Your wound, scrape, corn, or callus feels warm to the touch. ? You have pus or a bad smell coming from the wound, scrape, corn, or callus. ? You have a fever. ? You have a red line going up your leg. Summary  Check your feet every day for cuts, sores, red spots, swelling, and blisters.  Moisturize feet and legs daily.  Wear shoes that fit properly and have enough cushioning.  If you have foot problems, report any cuts, sores, or bruises to your health care provider immediately.  Schedule a complete foot exam at least once a year (annually) or more often if you have foot problems. This information is not intended to replace advice given to you by your health care provider. Make sure you discuss any questions you have with your health care provider. Document Revised: 09/27/2018 Document Reviewed: 02/06/2016 Elsevier Patient Education  2020 Elsevier Inc.  

## 2019-12-10 ENCOUNTER — Ambulatory Visit: Payer: Medicare Other | Admitting: Podiatry

## 2019-12-10 NOTE — Progress Notes (Signed)
  Subjective:  Patient ID: [name], female    DOB: 1945/02/07,  MRN: [id number]  Chief Complaint  Patient presents with  . Nail Problem    thick painful toenails  . Diabetes    diabetic foot exam  . Peripheral Neuropathy    74 y.o. female presents with the above complaint. History confirmed with patient.  Her A1c is 7.0%.  She has dry skin as well  Objective:  Physical Exam: warm, good capillary refill, no trophic changes or ulcerative lesions and normal DP and PT pulses.  Loss of protective sensation noted in toes bilaterally with abnormal monofilament test.  Onychomycosis is noted Assessment:  No diagnosis found.   Plan:  Patient was evaluated and treated and all questions answered.  Patient educated on diabetes. Discussed proper diabetic foot care and discussed risks and complications of disease. Educated patient in depth on reasons to return to the office immediately should he/she discover anything concerning or new on the feet. All questions answered. Discussed proper shoes as well.  Discussed the etiology and treatment options for the condition in detail with the patient. Educated patient on the topical and oral treatment options for mycotic nails. Recommended debridement of the nails today. Sharp and mechanical debridement performed of all painful and mycotic nails today. Nails debrided in length and thickness using a nail nipper and a mechanical burr to level of comfort. Discussed treatment options including appropriate shoe gear. Follow up as needed for painful nails.    Return in about 3 months (around 03/08/2020) for diabetic nail trim.

## 2019-12-27 ENCOUNTER — Telehealth: Payer: Self-pay | Admitting: Cardiovascular Disease

## 2019-12-27 NOTE — Telephone Encounter (Signed)
Patient reports chronic shortness of breath however has worsened in the past 4-5 days.  She reported approximately 4 to 6 pound weight gain recently but does not check her weight every single day.  She states that she does not have a scale at home.  She reported weight gain based on last office visit with PCP.  She does report lower extremity edema and some orthopnea.  Advised to take additional torsemide 50 mg this afternoon to see response and give [place] a call tomorrow.  Offered office visit with Dr. Audie Box  this afternoon at 3:20 PM however patient states that she cannot make up in short notice.  She needs transportation arrangement.  She was appreciative of call.

## 2019-12-27 NOTE — Telephone Encounter (Signed)
    Pt c/o swelling: STAT is pt has developed SOB within 24 hours  1) How much weight have you gained and in what time span?   2) If swelling, where is the swelling located? Legs and face  3) Are you currently taking a fluid pill? Yes  4) Are you currently SOB? Yes  5) Do you have a log of your daily weights (if so, list)?   6) Have you gained 3 pounds in a day or 5 pounds in a week?   7) Have you traveled recently? No   Pt said she is retaining fluid, she has swelling on her legs and face. She also said she gets SOB and minor CP, she would like to know if she needs to increase her fluid pill

## 2019-12-27 NOTE — Telephone Encounter (Signed)
Thanks Vin. I agree.   [name] T. Audie Box, [place]  [address] [place], [place] [address] [phone]  3:14 PM

## 2019-12-28 ENCOUNTER — Telehealth: Payer: Self-pay | Admitting: Cardiovascular Disease

## 2019-12-28 DIAGNOSIS — Z79899 Other long term (current) drug therapy: Secondary | ICD-10-CM

## 2019-12-28 NOTE — Telephone Encounter (Signed)
Follow Up:     Pt said she need to know what to do about her Torsemide please? She says the swelling is a lot better, but still have some. She said the chest pains are gone completely.

## 2019-12-28 NOTE — Telephone Encounter (Signed)
Spoke with patient. Advised patient to continue taking extra 50mg  of Torsemide today, and tomorrow and resume normal dosing of 50mg  daily on Sunday. Advised patient that she will need repeat blood work to check kidney function and electrolytes. Patient states that she lives in an assisted living facility and will check and see if she can get blood work there, if not patient states that she will try and arrange transport to bring her to the office Monday to have it drawn.   BMET order placed.

## 2019-12-28 NOTE — Telephone Encounter (Signed)
50 mg twice daily today and tomorrow.  She will then resume 50 mg daily on Sunday.  She will need a kidney panel on Monday.  [name] T. Audie Box, [place]  [address], [place], [place] [address] [phone]  2:15 PM

## 2019-12-28 NOTE — Telephone Encounter (Signed)
Spoke with patient who states that she called in yesterday due to swelling. Patient states she had been taking Methotrexate for 2 weeks and just recently stopped yesterday and she thinks that this may have caused her swelling. Patient states that she took the extra dose of 50mg  of torsemide that was recommended yesterday and states that her swelling is much better today, patient states that her shortness of breath is gone, and patient denies any chest pain. Patient does state that her legs are still slightly swollen and would like to know if she should take another extra dose today and if she should continue through the weekend.   Advised patient I would forward message to Dr. Jenetta DownerNori Riis for advice and recommendations.   Patient verbalized understanding.

## 2019-12-31 LAB — BASIC METABOLIC PANEL
BUN/Creatinine Ratio: 18 (ref 12–28)
BUN: 20 mg/dL (ref 8–27)
CO2: 24 mmol/L (ref 20–29)
Calcium: 9.4 mg/dL (ref 8.7–10.3)
Chloride: 99 mmol/L (ref 96–106)
Creatinine, Ser: 1.1 mg/dL — ABNORMAL HIGH (ref 0.57–1.00)
GFR calc Af Amer: 58 mL/min/{1.73_m2} — ABNORMAL LOW (ref >59)
GFR calc non Af Amer: 50 mL/min/{1.73_m2} — ABNORMAL LOW (ref >59)
Glucose: 134 mg/dL — ABNORMAL HIGH (ref 65–99)
Potassium: 4.6 mmol/L (ref 3.5–5.2)
Sodium: 139 mmol/L (ref 134–144)

## 2020-01-03 ENCOUNTER — Telehealth: Payer: Self-pay

## 2020-01-03 NOTE — Telephone Encounter (Signed)
NOTES ON FILE FROM [facility] [phone], SENT REFERRAL TO SCHEDULING 

## 2020-01-03 NOTE — Telephone Encounter (Signed)
ERROR

## 2020-01-28 ENCOUNTER — Other Ambulatory Visit: Payer: Self-pay

## 2020-01-29 ENCOUNTER — Telehealth: Payer: Self-pay | Admitting: Cardiovascular Disease

## 2020-01-29 MED ORDER — TORSEMIDE 100 MG PO TABS: 50.0000 mg | ORAL_TABLET | Freq: Every day | ORAL | 1 refills | Status: DC

## 2020-01-29 NOTE — Telephone Encounter (Signed)
*  STAT* If patient is at the pharmacy, call can be transferred to refill team.   1. Which medications need to be refilled? (please list name of each medication and dose if known) torsemide (DEMADEX) 100 MG tablet(Expired)  2. Which pharmacy/location (including street and city if local pharmacy) is medication to be sent to? [facility] [place] [facility] [address] [place], [place] [address]  Telephone: [phone]  FAX: [phone]  3. Do they need a 30 day or 90 day supply? [place]

## 2020-02-04 ENCOUNTER — Telehealth: Payer: Self-pay | Admitting: Cardiovascular Disease

## 2020-02-04 NOTE — Telephone Encounter (Signed)
*  STAT* If patient is at the pharmacy, call can be transferred to refill team.   1. Which medications need to be refilled? (please list name of each medication and dose if known)  torsemide (DEMADEX) 100 MG tablet  2. Which pharmacy/location (including street and city if local pharmacy) is medication to be sent to? [place] [place]  3. Do they need a 30 day or 90 day supply? [place]

## 2020-02-18 MED ORDER — TORSEMIDE 100 MG PO TABS: 50.0000 mg | ORAL_TABLET | Freq: Every day | ORAL | 3 refills | Status: DC

## 2020-02-18 NOTE — Telephone Encounter (Signed)
[facility] calling back. They have been trying to get a refill for a month for the patient. They filled an emergency supply, but the patient will be out this week. Fax: [phone], Phone: [phone]

## 2020-02-18 NOTE — Telephone Encounter (Signed)
Spoke with [facility] who confirmed they received the refill for Torsemide.

## 2020-02-22 ENCOUNTER — Other Ambulatory Visit: Payer: Self-pay

## 2020-02-22 ENCOUNTER — Encounter: Payer: Medicare Other | Attending: Geriatric Medicine | Admitting: Dietician

## 2020-02-22 ENCOUNTER — Encounter: Payer: Self-pay | Admitting: Dietician

## 2020-02-22 DIAGNOSIS — E118 Type 2 diabetes mellitus with unspecified complications: Secondary | ICD-10-CM | POA: Insufficient documentation

## 2020-02-22 NOTE — Progress Notes (Signed)
Diabetes Self-Management Education  Visit Type: First/Initial  Appt. Start Time: 1330 Appt. End Time: 1500  02/22/2020  [name], identified by name and date of birth, is a 75 y.o. female with a diagnosis of Diabetes: Type 2.   ASSESSMENT Patient is here today with [name] who is a caregiver from assisted living. She arrived in a wheel chair due to inability to walk long distances. Pulse ox shortly after arrival was 30 which patient stated was normal.  She has not tested her blood glucose since November as her strips were too expensive. Diarrhea daily after lunch. She states her breathing is worse since Covid-19 2020 and was hospitalized. She states that she takes more medication than she would like to take but is concerned about stopping them. She has dental issues and needs soft food. She struggles with sleep and sleeps 2 hours at a time, then wakes for 2 hours. She stated that she tried to stay awake all day and night sleeping remained poor.   History includes Type 2 Diabetes, osteoarthritis, OSA on C-pap, HLD, HTN, chronic diastolic heart failure, visual issues due to cataracts Labs include:  A1C 7%, eGFR 51 12/03/2019 Medications include:  NPH 70/30 Insulin (vial) 70 units with each meal.  She states that she is not always able to see exact amounts of insulin and feels that she gets 60-70 units per dose.    Weight hx: 320 lbs usual recent body weight 317 lbs per chart 10/03/2019 She has followed Nutrisystem and Weight Watchers in the past, lost 20 lbs but was not able to sustain the weight loss.  Contour Next One Blood glucose meter provided.  Lot [id number], Expiration 06/17/2020.  Patient was able to demonstrate and blood glucose was 271 after lunch.  Patient did have dessert. Patient requested that I message her MD regarding strips and lancets for this meter.  Message sent to Dr. Felipa Eth.  Patient lives at [facility].  They provide 3 meals  daily and 1 snack which are delivered to her room.  She has snacks in her room.  She is able to choose her own meals and desserts are offered with each.  She reports history of being an emotional eater.  She states that she has been craving chocolate milk recently and feels that this is due to lacking some nutrient.  She is retired.  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  She enjoys doing Dentist as well as playing games on the Internet with online friends.   Height 5' 4"  (1.626 m), weight (!) 320 lb (145.2 kg). Body mass index is 54.93 kg/m.   Diabetes Self-Management Education - 02/22/20 1403      Visit Information   Visit Type First/Initial      Initial Visit   Diabetes Type Type 2    Are you currently following a meal plan? No    Are you taking your medications as prescribed? Yes    Date Diagnosed 2000      Health Coping   How would you rate your overall health? Poor      Psychosocial Assessment   Patient Belief/Attitude about Diabetes Defeat/Burnout    Self-care barriers Impaired vision;Debilitated state due to current medical condition    Self-management support Doctor's office    Other persons present Patient    Patient Concerns Nutrition/Meal planning;Glycemic Control;Problem Solving;Monitoring;Weight Control    Special Needs None    Preferred Learning  Style No preference indicated    Learning Readiness Ready    How often do you need to have someone help you when you read instructions, pamphlets, or other written materials from your doctor or pharmacy? 1 - Never    What is the last grade level you completed in school? college graduate      Pre-Education Assessment   Patient understands the diabetes disease and treatment process. Needs Review    Patient understands incorporating nutritional management into lifestyle. Needs Review    Patient undertands incorporating physical activity into lifestyle. Needs Review    Patient  understands using medications safely. Needs Review    Patient understands monitoring blood glucose, interpreting and using results Needs Review    Patient understands prevention, detection, and treatment of acute complications. Needs Review    Patient understands prevention, detection, and treatment of chronic complications. Needs Review    Patient understands how to develop strategies to address psychosocial issues. Needs Review    Patient understands how to develop strategies to promote health/change behavior. Needs Review      Complications   Last HgB A1C per patient/outside source 7 %   12/03/2019   How often do you check your blood sugar? 0 times/day (not testing)    Have you had a dilated eye exam in the past 12 months? Yes    Have you had a dental exam in the past 12 months? No    Are you checking your feet? No      Dietary Intake   Breakfast cereal, occasional banana, milk, rare juice    Snack (morning) none    Lunch occasional soup with, fried catfish or tuna and crackers with slaw or salad, fresh or canned fruit    Snack (afternoon) none    Dinner salad, fruit, tuna and crackers or shrimp cocktail OR ramen OR hotdog or ham and cheese sandwich or hamburger OR soup OR boiled eggs, salad, fruit    Snack (evening) occasional cereal bar or NABS    Beverage(s) water, sugar free tea, crystal light, occasional coffee, occasional 8 oz coke      Exercise   Exercise Type ADL's    How many days per week to you exercise? 0    How many minutes per day do you exercise? 0    Total minutes per week of exercise 0      Patient Education   Previous Diabetes Education Yes (please comment)   years ago   Nutrition management  Role of diet in the treatment of diabetes and the relationship between the three main macronutrients and blood glucose level;Meal options for control of blood glucose level and chronic complications.;Other (comment)   mindfullness related to meal choices and amount eaten    Physical activity and exercise  Role of exercise on diabetes management, blood pressure control and cardiac health.;Helped patient identify appropriate exercises in relation to his/her diabetes, diabetes complications and other health issue.    Medications Reviewed patients medication for diabetes, action, purpose, timing of dose and side effects.    Monitoring Taught/evaluated SMBG meter.;Taught/discussed recording of test results and interpretation of SMBG.;Identified appropriate SMBG and/or A1C goals.;Daily foot exams;Yearly dilated eye exam    Acute complications Taught treatment of hypoglycemia - the 15 rule.;Discussed and identified patients' treatment of hyperglycemia.    Psychosocial adjustment Role of stress on diabetes      Individualized Goals (developed by patient)   Nutrition General guidelines for healthy choices and portions discussed    Physical  Activity Exercise 3-5 times per week;15 minutes per day    Medications take my medication as prescribed    Monitoring  test my blood glucose as discussed    Reducing Risk examine blood glucose patterns;increase portions of healthy fats;do foot checks daily    Health Coping discuss diabetes with (comment)   MD, RD, CDCES     Post-Education Assessment   Patient understands the diabetes disease and treatment process. Demonstrates understanding / competency    Patient understands incorporating nutritional management into lifestyle. Needs Review    Patient undertands incorporating physical activity into lifestyle. Needs Review    Patient understands using medications safely. Demonstrates understanding / competency    Patient understands monitoring blood glucose, interpreting and using results Demonstrates understanding / competency    Patient understands prevention, detection, and treatment of acute complications. Demonstrates understanding / competency    Patient understands prevention, detection, and treatment of chronic complications.  Demonstrates understanding / competency    Patient understands how to develop strategies to address psychosocial issues. Needs Review    Patient understands how to develop strategies to promote health/change behavior. Needs Review      Outcomes   Expected Outcomes Demonstrated interest in learning. Expect positive outcomes    Future DMSE PRN    Program Status Completed           Individualized Plan for Diabetes Self-Management Training:   Learning Objective:  Patient will have a greater understanding of diabetes self-management. Patient education plan is to attend individual and/or group sessions per assessed needs and concerns.   Plan:   Patient Instructions  "I don't have to eat everything on my plate."  A small portion of protein with each meals.  Boiled egg with breakfast for example.  Mindfulness:  Choices (when you order your meals choose foods that are lowest in fat and sugar)  Eat slowly and stop eating when you are satisfied.  Between meals, "Am I hungry? Or eating for another reason?"   Focus on the healthy habits. Drink more water.  Move more as tolerated  Sit and Be Fit on you tube  Consider a class where you live  You take a multivitamin   Consider stopping zinc, magnesium, and vitamin C   Expected Outcomes:  Demonstrated interest in learning. Expect positive outcomes  Education material provided: ADA - How to Thrive: A Guide for Your Journey with Diabetes  If problems or questions, patient to contact team via:  Phone  Future DSME appointment: PRN

## 2020-02-22 NOTE — Patient Instructions (Addendum)
"  I don't have to eat everything on my plate."  A small portion of protein with each meals.  Boiled egg with breakfast for example.  Mindfulness:  Choices (when you order your meals choose foods that are lowest in fat and sugar)  Eat slowly and stop eating when you are satisfied.  Between meals, "Am I hungry? Or eating for another reason?"   Focus on the healthy habits. Drink more water.  Move more as tolerated  Sit and Be Fit on you tube  Consider a class where you live  You take a multivitamin   Consider stopping zinc, magnesium, and vitamin C

## 2020-03-11 ENCOUNTER — Other Ambulatory Visit: Payer: Self-pay

## 2020-03-11 ENCOUNTER — Ambulatory Visit (INDEPENDENT_AMBULATORY_CARE_PROVIDER_SITE_OTHER): Payer: Medicare Other | Admitting: Podiatry

## 2020-03-11 DIAGNOSIS — L853 Xerosis cutis: Secondary | ICD-10-CM | POA: Diagnosis not present

## 2020-03-11 DIAGNOSIS — E1142 Type 2 diabetes mellitus with diabetic polyneuropathy: Secondary | ICD-10-CM

## 2020-03-11 DIAGNOSIS — B351 Tinea unguium: Secondary | ICD-10-CM | POA: Diagnosis not present

## 2020-03-11 DIAGNOSIS — M79674 Pain in right toe(s): Secondary | ICD-10-CM

## 2020-03-11 DIAGNOSIS — M79675 Pain in left toe(s): Secondary | ICD-10-CM

## 2020-03-13 ENCOUNTER — Encounter: Payer: Self-pay | Admitting: Podiatry

## 2020-03-13 NOTE — Progress Notes (Signed)
  Subjective:  Patient ID: [name], female    DOB: [birth year],  MRN: [id number]  Chief Complaint  Patient presents with  . diabetic foot care    Nail trim     75 y.o. female presents with the above complaint. History confirmed with patient. Her skin is still very dry.   Objective:  Physical Exam: warm, good capillary refill, no trophic changes or ulcerative lesions and normal DP and PT pulses.  Loss of protective sensation noted in toes bilaterally with abnormal monofilament test.  Onychomycosis is noted with thickened elongated nail plates, yellow discoloration ingrowing incurvated borders Assessment:  No diagnosis found.   Plan:  Patient was evaluated and treated and all questions answered.  Patient educated on diabetes. Discussed proper diabetic foot care and discussed risks and complications of disease. Educated patient in depth on reasons to return to the office immediately should he/she discover anything concerning or new on the feet. All questions answered. Discussed proper shoes as well.   Discussed lotions, she is use around lotions and does not help. However will be a good solution to get her dry skin to resolve completely she is unable to reach her feet to put them on because of hip and knee issues.  Discussed the etiology and treatment options for the condition in detail with the patient. Educated patient on the topical and oral treatment options for mycotic nails. Recommended debridement of the nails today. Sharp and mechanical debridement performed of all painful and mycotic nails today. Nails debrided in length and thickness using a nail nipper and a mechanical burr to level of comfort. Discussed treatment options including appropriate shoe gear. Follow up as needed for painful nails.    Return in about 3 months (around 06/08/2020) for at risk diabetic foot care.

## 2020-04-01 ENCOUNTER — Telehealth: Payer: Self-pay | Admitting: Podiatry

## 2020-04-01 NOTE — Telephone Encounter (Signed)
Tried to reschedule appt on 3/24 w/ Dr. Sherryle Lis. Patient was busy and said she'll call back to r/s once she was done.

## 2020-05-02 ENCOUNTER — Observation Stay (HOSPITAL_COMMUNITY)
Admission: EM | Admit: 2020-05-02 | Discharge: 2020-05-04 | Disposition: A | Discharge status: Home / Self Care | Payer: Medicare Other | Attending: Emergency Medicine | Admitting: Emergency Medicine

## 2020-05-02 ENCOUNTER — Other Ambulatory Visit: Payer: Self-pay

## 2020-05-02 ENCOUNTER — Emergency Department (HOSPITAL_COMMUNITY): Payer: Medicare Other

## 2020-05-02 DIAGNOSIS — Z794 Long term (current) use of insulin: Secondary | ICD-10-CM | POA: Insufficient documentation

## 2020-05-02 DIAGNOSIS — J9611 Chronic respiratory failure with hypoxia: Secondary | ICD-10-CM | POA: Diagnosis present

## 2020-05-02 DIAGNOSIS — J9610 Chronic respiratory failure, unspecified whether with hypoxia or hypercapnia: Secondary | ICD-10-CM | POA: Diagnosis not present

## 2020-05-02 DIAGNOSIS — Z87891 Personal history of nicotine dependence: Secondary | ICD-10-CM | POA: Insufficient documentation

## 2020-05-02 DIAGNOSIS — E860 Dehydration: Secondary | ICD-10-CM | POA: Insufficient documentation

## 2020-05-02 DIAGNOSIS — L4050 Arthropathic psoriasis, unspecified: Secondary | ICD-10-CM | POA: Diagnosis present

## 2020-05-02 DIAGNOSIS — I110 Hypertensive heart disease with heart failure: Secondary | ICD-10-CM | POA: Diagnosis not present

## 2020-05-02 DIAGNOSIS — N179 Acute kidney failure, unspecified: Secondary | ICD-10-CM | POA: Diagnosis not present

## 2020-05-02 DIAGNOSIS — T50B95A Adverse effect of other viral vaccines, initial encounter: Secondary | ICD-10-CM | POA: Diagnosis present

## 2020-05-02 DIAGNOSIS — R0602 Shortness of breath: Secondary | ICD-10-CM | POA: Diagnosis present

## 2020-05-02 DIAGNOSIS — I5033 Acute on chronic diastolic (congestive) heart failure: Secondary | ICD-10-CM | POA: Diagnosis present

## 2020-05-02 DIAGNOSIS — I2510 Atherosclerotic heart disease of native coronary artery without angina pectoris: Secondary | ICD-10-CM | POA: Diagnosis not present

## 2020-05-02 DIAGNOSIS — Z20822 Contact with and (suspected) exposure to covid-19: Secondary | ICD-10-CM | POA: Insufficient documentation

## 2020-05-02 DIAGNOSIS — Z79899 Other long term (current) drug therapy: Secondary | ICD-10-CM | POA: Diagnosis not present

## 2020-05-02 DIAGNOSIS — G4733 Obstructive sleep apnea (adult) (pediatric): Secondary | ICD-10-CM

## 2020-05-02 DIAGNOSIS — R2681 Unsteadiness on feet: Secondary | ICD-10-CM | POA: Insufficient documentation

## 2020-05-02 DIAGNOSIS — R6510 Systemic inflammatory response syndrome (SIRS) of non-infectious origin without acute organ dysfunction: Secondary | ICD-10-CM | POA: Diagnosis not present

## 2020-05-02 DIAGNOSIS — I5032 Chronic diastolic (congestive) heart failure: Secondary | ICD-10-CM | POA: Insufficient documentation

## 2020-05-02 DIAGNOSIS — I10 Essential (primary) hypertension: Secondary | ICD-10-CM | POA: Diagnosis present

## 2020-05-02 DIAGNOSIS — Z8616 Personal history of COVID-19: Secondary | ICD-10-CM | POA: Insufficient documentation

## 2020-05-02 DIAGNOSIS — E119 Type 2 diabetes mellitus without complications: Secondary | ICD-10-CM | POA: Insufficient documentation

## 2020-05-02 DIAGNOSIS — Z7982 Long term (current) use of aspirin: Secondary | ICD-10-CM | POA: Diagnosis not present

## 2020-05-02 LAB — CBC WITH DIFFERENTIAL/PLATELET
Abs Immature Granulocytes: 0.06 10*3/uL (ref 0.00–0.07)
Basophils Absolute: 0 10*3/uL (ref 0.0–0.1)
Basophils Relative: 0 %
Eosinophils Absolute: 0.1 10*3/uL (ref 0.0–0.5)
Eosinophils Relative: 1 %
HCT: 41.9 % (ref 36.0–46.0)
Hemoglobin: 13.4 g/dL (ref 12.0–15.0)
Immature Granulocytes: 1 %
Lymphocytes Relative: 4 %
Lymphs Abs: 0.4 10*3/uL — ABNORMAL LOW (ref 0.7–4.0)
MCH: 30.1 pg (ref 26.0–34.0)
MCHC: 32 g/dL (ref 30.0–36.0)
MCV: 94.2 fL (ref 80.0–100.0)
Monocytes Absolute: 0.6 10*3/uL (ref 0.1–1.0)
Monocytes Relative: 6 %
Neutro Abs: 8.9 10*3/uL — ABNORMAL HIGH (ref 1.7–7.7)
Neutrophils Relative %: 88 %
Platelets: 199 10*3/uL (ref 150–400)
RBC: 4.45 MIL/uL (ref 3.87–5.11)
RDW: 15.8 % — ABNORMAL HIGH (ref 11.5–15.5)
WBC: 10 10*3/uL (ref 4.0–10.5)
nRBC: 0 % (ref 0.0–0.2)

## 2020-05-02 LAB — BASIC METABOLIC PANEL
Anion gap: 8 (ref 5–15)
BUN: 30 mg/dL — ABNORMAL HIGH (ref 8–23)
CO2: 30 mmol/L (ref 22–32)
Calcium: 9.2 mg/dL (ref 8.9–10.3)
Chloride: 98 mmol/L (ref 98–111)
Creatinine, Ser: 1.21 mg/dL — ABNORMAL HIGH (ref 0.44–1.00)
GFR, Estimated: 47 mL/min — ABNORMAL LOW (ref >60)
Glucose, Bld: 186 mg/dL — ABNORMAL HIGH (ref 70–99)
Potassium: 4.5 mmol/L (ref 3.5–5.1)
Sodium: 136 mmol/L (ref 135–145)

## 2020-05-02 LAB — BRAIN NATRIURETIC PEPTIDE: B Natriuretic Peptide: 41.1 pg/mL (ref 0.0–100.0)

## 2020-05-02 LAB — LACTIC ACID, PLASMA: Lactic Acid, Venous: 3 mmol/L (ref 0.5–1.9)

## 2020-05-02 MED ORDER — ACETAMINOPHEN 500 MG PO TABS
1000.0000 mg | ORAL_TABLET | Freq: Once | ORAL | Status: AC
  Administered 2020-05-02: 1000 mg via ORAL
  Filled 2020-05-02: qty 2

## 2020-05-02 MED ORDER — ONDANSETRON HCL 4 MG/2ML IJ SOLN
4.0000 mg | Freq: Once | INTRAMUSCULAR | Status: AC
  Administered 2020-05-02: 4 mg via INTRAVENOUS
  Filled 2020-05-02: qty 2

## 2020-05-02 MED ORDER — FUROSEMIDE 10 MG/ML IJ SOLN
40.0000 mg | Freq: Once | INTRAMUSCULAR | Status: AC
  Administered 2020-05-03: 40 mg via INTRAVENOUS
  Filled 2020-05-02: qty 4

## 2020-05-02 NOTE — ED Provider Notes (Signed)
[facility] EMERGENCY DEPARTMENT Provider Note   CSN: [id number] Arrival date & time: 05/02/20  2055     History Chief Complaint  Patient presents with  . Shortness of Breath    EMS arrival SOB. Sxs onset headache yesterday. SOB today. Came from assisted living facility.    [name] is a 75 y.o. female past medical history of COVID-19, OSA on CPAP, CHF, diabetes, CAD, hypertension, presenting for evaluation of shortness of breath.  Patient states yesterday she had her COVID-19 booster shot yesterday.  Afterwards she developed a headache.  Today her headache resolved, however she developed fever, chills, nausea and shortness of breath.  She states shortness of breath is rather persistent.  She also experiences orthopnea with this shortness of breath today which is not a constant occurrence for her.  She does note history of CHF, states this feels somewhat different from an exacerbation.  She denies any worsening lower extremity swelling.  Denies abdominal pain, cough, sore throat, urinary symptoms. States she had COVID-19 illness in 2020 requiring admission.  Since then she has been having difficulty with her breathing since then.  Her baseline O2 saturation is anywhere from the mid 80s up to 94% on room air.  This fluctuates and is her baseline now.  She is followed outpatient for this.  Echocardiogram done in August 2021 reveals EF of 50 to 55%.  The history is provided by the patient.       Past Medical History:  Diagnosis Date  . Coronary artery disease   . Diabetes mellitus without complication ([place])   . Hyperlipidemia   . Hypertension   . OSA (obstructive sleep apnea)     Patient Active Problem List   Diagnosis Date Noted  . Cervical disc prolapse with radiculopathy 12/07/2019  . History of pheochromocytoma 12/07/2019  . Non-comitant strabismus 12/07/2019  . OSA on CPAP 12/07/2019  . Primary osteoarthritis involving multiple joints 06/20/2019  .  Gait abnormality 06/19/2019  . 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 TIA (transient ischemic attack) 03/22/2017  . Chronic diastolic (congestive) heart failure ([place]) 03/09/2017  . Diabetic hypoglycemia ([place]) 03/05/2014  . 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 Dizzy 12/28/2011  . CAD (coronary artery disease), native coronary artery 12/05/2010  . Morbid obesity ([place]) 12/05/2010  . Essential hypertension 12/03/2010  . HLD (hyperlipidemia) 12/03/2010  . UI (urinary incontinence) 12/03/2010  . Chest pain 05/31/2006    Past Surgical History:  Procedure Laterality Date  . CARDIAC CATHETERIZATION    . pheochromocytoma    . TONSILLECTOMY       OB History   No obstetric history on file.     Family History  Problem Relation Age of Onset  . Heart attack Mother   . Stroke Mother   . Heart attack Father     Social History   Tobacco Use  . Smoking status: Former Smoker    Packs/day: 2.00    Years: 10.00    Pack years: 20.00    Quit date: 08/15/1989    Years since quitting: 30.7  . Smokeless tobacco: Never Used  Substance Use Topics  . Alcohol use: Not Currently  . Drug use: Not Currently    Home Medications Prior to Admission medications   Medication Sig Start Date End Date Taking? Authorizing Provider  acetaminophen (TYLENOL) 500 MG tablet Take 500 mg by mouth every 8 (eight) hours as needed for moderate pain or headache.   Yes [provider]  acetaminophen (  TYLENOL) 650 MG CR tablet Take 1,300 mg by mouth in the morning and at bedtime.   Yes [provider]  albuterol (VENTOLIN HFA) 108 (90 Base) MCG/ACT inhaler Inhale 2 puffs into the lungs 4 (four) times daily as needed for wheezing or shortness of breath.   Yes [provider]  amLODipine (NORVASC) 5 MG tablet Take 5 mg by mouth daily.   Yes [provider]  ascorbic acid (VITAMIN C) 1000 MG tablet Take 1,000 mg by mouth daily.   Yes [provider]   aspirin EC 81 MG tablet Take 81 mg by mouth daily. Swallow whole.   Yes [provider]  BREO ELLIPTA 200-25 MCG/INH AEPB Inhale 1 puff into the lungs daily. 04/22/20  Yes [provider]  carvedilol (COREG) 25 MG tablet Take 25 mg by mouth 2 (two) times daily with a meal.   Yes [provider]  Cholecalciferol (VITAMIN D3) 125 MCG (5000 UT) CAPS Take 5,000 Units by mouth.   Yes [provider]  diclofenac Sodium (VOLTAREN) 1 % GEL Apply 2-4 g topically 4 (four) times daily as needed (pain).   Yes [provider]  ferrous sulfate 325 (65 FE) MG tablet Take 325 mg by mouth every Wednesday.   Yes [provider]  fluticasone (FLONASE) 50 MCG/ACT nasal spray Place 1 spray into both nostrils daily as needed for allergies or rhinitis.   Yes [provider]  glucose 4 GM chewable tablet Chew 3 tablets by mouth as needed for low blood sugar.   Yes [provider]  glucose blood test strip as directed. Dispense Blood Glucose Test Strips to patient for home use. Testing 4 times daily. 01/16/19  Yes [provider]  insulin NPH-regular Human (70-30) 100 UNIT/ML injection Inject 73 Units into the skin with breakfast, with lunch, and with evening meal.   Yes [provider]  Insulin Syringe-Needle U-100 (INSULIN SYRINGE 1CC/31GX5/16") 31G X 5/16" 1 ML MISC USE TO INJECT INSULIN TWO TIMES A DAY 03/28/19  Yes [provider]  Ixekizumab 80 MG/ML SOAJ Inject into the skin. Give subcutaneous every 4 weeks   Yes [provider]  Lancets 33G MISC as directed. Testing 4 times daily.  Dispense Lancets to patient for home use. 01/16/19  Yes [provider]  lisinopril (ZESTRIL) 10 MG tablet Take 10 mg by mouth daily.   Yes [provider]  melatonin 5 MG TABS 1 tablet in the evening 05/01/20  Yes [provider]  meloxicam (MOBIC) 7.5 MG tablet Take 7.5 mg by mouth in the morning and at  bedtime.   Yes [provider]  Multiple Vitamin (MULTIVITAMIN ADULT PO) Take 1 tablet by mouth daily.   Yes [provider]  polyethylene glycol (MIRALAX / GLYCOLAX) 17 g packet Take 17 g by mouth daily as needed for mild constipation.   Yes [provider]  polyethylene glycol powder (GLYCOLAX/MIRALAX) 17 GM/SCOOP powder Take 17 g by mouth daily as needed for mild constipation.   Yes [provider]  ranolazine (RANEXA) 1000 MG SR tablet Take 500 mg by mouth 2 (two) times daily.   Yes [provider]  Respiratory Therapy Supplies [place] as directed. BIPAP 18/14 cm H2O with humidifier, mask, tubing, and headgear. 03/14/19  Yes [provider]  rosuvastatin (CRESTOR) 20 MG tablet Take 20 mg by mouth at bedtime.   Yes [provider]  tiotropium (SPIRIVA HANDIHALER) 18 MCG inhalation capsule Place 18 mcg into  inhaler and inhale daily.   Yes [provider]  torsemide (DEMADEX) 100 MG tablet Take 0.5 tablets (50 mg total) by mouth daily. 02/18/20 05/18/20 Yes O'Neal, Cassie Freer, MD  Turmeric 500 MG CAPS Take 500 mg by mouth daily.   Yes [provider]  vitamin B-12 (CYANOCOBALAMIN) 1000 MCG tablet Take 1,000 mcg by mouth daily.   Yes [provider]    Allergies    Metaxalone, Nitroglycerin, Tramadol-acetaminophen, Icosapent ethyl, Brompheniramine-pseudoeph, Meprobamate, Metformin, Methyldopa, Metoprolol, Procaine, and Pseudoephedrine hcl  Review of Systems   Review of Systems  Constitutional: Positive for chills and fever.  Respiratory: Positive for shortness of breath.   Gastrointestinal: Positive for nausea.  All other systems reviewed and are negative.   Physical Exam Updated Vital Signs Pulse (!) 113   Temp (!) 102.2 F (39 C) (Oral)   Resp (!) 26   SpO2 98%   Physical Exam Vitals and nursing note reviewed.  Constitutional:      Appearance: She is well-developed.  HENT:     Head:  Normocephalic and atraumatic.  Eyes:     Conjunctiva/sclera: Conjunctivae normal.  Cardiovascular:     Rate and Rhythm: Regular rhythm. Tachycardia present.  Pulmonary:     Effort: Pulmonary effort is normal. Tachypnea present.     Breath sounds: Normal breath sounds.  Abdominal:     General: Bowel sounds are normal.     Palpations: Abdomen is soft.     Tenderness: There is no abdominal tenderness. There is no guarding or rebound.  Musculoskeletal:     Right lower leg: Edema present.     Left lower leg: Edema present.  Skin:    General: Skin is warm.  Neurological:     Mental Status: She is alert.  Psychiatric:        Behavior: Behavior normal.     ED Results / Procedures / Treatments   Labs (all labs ordered are listed, but only abnormal results are displayed) Labs Reviewed  CBC WITH DIFFERENTIAL/PLATELET - Abnormal; Notable for the following components:      Result Value   RDW 15.8 (*)    Neutro Abs 8.9 (*)    Lymphs Abs 0.4 (*)    All other components within normal limits  CULTURE, BLOOD (ROUTINE X 2)  CULTURE, BLOOD (ROUTINE X 2)  SARS CORONAVIRUS 2 (TAT 6-24 HRS)  BASIC METABOLIC PANEL  BRAIN NATRIURETIC PEPTIDE  LACTIC ACID, PLASMA  LACTIC ACID, PLASMA  URINALYSIS, ROUTINE W REFLEX MICROSCOPIC    EKG EKG Interpretation  Date/Time:  Friday May 02 2020 21:31:08 EDT Ventricular Rate:  116 PR Interval:  166 QRS Duration: 103 QT Interval:  305 QTC Calculation: 424 R Axis:   22 Text Interpretation: Sinus or ectopic atrial tachycardia Atrial premature complex Low voltage, precordial leads Consider anterior infarct Sinus rhythm with PAC Confirmed by Lavenia Atlas [phone]) on 05/02/2020 9:40:55 PM   Radiology DG Chest Port 1 View  Result Date: 05/02/2020 CLINICAL DATA:  Shortness of breath EXAM: PORTABLE CHEST 1 VIEW COMPARISON:  None. FINDINGS: Cardiomegaly, vascular congestion. No overt edema. No confluent opacities or effusions. No acute bony abnormality.  IMPRESSION: Cardiomegaly, vascular congestion. Electronically Signed   By: Rolm Baptise M.D.   On: 05/02/2020 22:11    Procedures Procedures   Medications Ordered in ED Medications  ondansetron (ZOFRAN) injection 4 mg (4 mg Intravenous Given 05/02/20 2200)  acetaminophen (TYLENOL) tablet 1,000 mg (1,000 mg Oral Given 05/02/20 2200)    ED Course  I have reviewed the triage vital signs and the nursing notes.  Pertinent labs & imaging results that were available during my care of the patient were reviewed by me and considered in my medical decision making (see chart for details).  Clinical Course as of 05/03/20 0036  Fri May 02, 2020  2253 Patient re-evaluated. Normal resp rate, appears much more comfortable. Temp down to 99.6. HR remains 110-112.  [JR]  2356 Lasix, urine. If urine neg, symptoms improve, ambulates, can d/c. If not improved, will need admission. [JR]    Clinical Course User Index [JR] Thadius Smisek, [place] N, PA-C   MDM Rules/Calculators/A&P                          Patient is 75 year old female presenting from assisted living facility with shortness of breath after receiving her Covid 19 booster shot yesterday.  Symptoms began with headache, headache resolved and she developed fever and chills as well as shortness of breath and nausea.  No recent illness.  She did have COVID-19 in 2020.  She also has history of CHF and reports orthopnea today.  She is placed on oxygen in triage though was not hypoxic prior to this, this was purely for comfort.  She does have some lower extremity edema, states this is not worse than usual.  Abdomen is benign.  Lungs are clear.  She is tachypneic.  She is febrile.  She is slightly tachycardic and hypertensive.  Care plan discussed with attending physician Dr. Raliegh Ip. Horton.  Orders placed including CBC, BMP, BNP, lactic, cultures.  Chest x-ray, EKG.  Will hold on fluids at this time due to history of heart failure and unsure if current exacerbation at  this point and work-up.  Chest x-ray with vascular congestion, no obvious edema or infiltrates.  EKG appears to be sinus rhythm.  She appears much improved on reevaluation, respiratory rate is normal.  Blood pressure is normalized 120/70.  Temperature is now down to 99.6 F.  She states she feels much better after Tylenol and Zofran and feels as though her symptoms are likely related to her Covid booster.  She is however needing to remain upright to prevent any shortness of breath, this is not a constant occurrence for her, only when her CHF is acting up.  Will dose with Lasix and plan to reevaluate.  UA pending.   Care assumed at shift change by Montine Circle, PA-C.  Plan to follow UA.  If this is positive for UTI recommend treatment and admission for SIRS and suspected CHF exacerbation.  If UA is negative, if symptoms improve and vital signs normalize, believe she can discharge with close outpatient follow-up.  Systemic symptoms are likely due to immune response to COVID-19 booster shot yesterday.  If symptoms and/or vital signs are not improved, recommend she be admitted for further management. Final Clinical Impression(s) / ED Diagnoses Final diagnoses:  None    Rx / DC Orders ED Discharge Orders    None       Sigmund Morera, [place] N, PA-C 05/03/20 0040    Lorelle Gibbs, DO 05/05/20 1913

## 2020-05-03 ENCOUNTER — Observation Stay (HOSPITAL_COMMUNITY): Payer: Medicare Other

## 2020-05-03 DIAGNOSIS — L4050 Arthropathic psoriasis, unspecified: Secondary | ICD-10-CM

## 2020-05-03 DIAGNOSIS — J9611 Chronic respiratory failure with hypoxia: Secondary | ICD-10-CM

## 2020-05-03 DIAGNOSIS — Z794 Long term (current) use of insulin: Secondary | ICD-10-CM

## 2020-05-03 DIAGNOSIS — I10 Essential (primary) hypertension: Secondary | ICD-10-CM

## 2020-05-03 DIAGNOSIS — I5032 Chronic diastolic (congestive) heart failure: Secondary | ICD-10-CM | POA: Diagnosis not present

## 2020-05-03 DIAGNOSIS — R6510 Systemic inflammatory response syndrome (SIRS) of non-infectious origin without acute organ dysfunction: Secondary | ICD-10-CM | POA: Diagnosis present

## 2020-05-03 DIAGNOSIS — E119 Type 2 diabetes mellitus without complications: Secondary | ICD-10-CM

## 2020-05-03 DIAGNOSIS — T50B95A Adverse effect of other viral vaccines, initial encounter: Secondary | ICD-10-CM | POA: Diagnosis not present

## 2020-05-03 DIAGNOSIS — G4733 Obstructive sleep apnea (adult) (pediatric): Secondary | ICD-10-CM

## 2020-05-03 DIAGNOSIS — J9612 Chronic respiratory failure with hypercapnia: Secondary | ICD-10-CM

## 2020-05-03 DIAGNOSIS — Z9989 Dependence on other enabling machines and devices: Secondary | ICD-10-CM

## 2020-05-03 LAB — GLUCOSE, CAPILLARY
Glucose-Capillary: 215 mg/dL — ABNORMAL HIGH (ref 70–99)
Glucose-Capillary: 171 mg/dL — ABNORMAL HIGH (ref 70–99)
Glucose-Capillary: 191 mg/dL — ABNORMAL HIGH (ref 70–99)
Glucose-Capillary: 158 mg/dL — ABNORMAL HIGH (ref 70–99)
Glucose-Capillary: 142 mg/dL — ABNORMAL HIGH (ref 70–99)

## 2020-05-03 LAB — BASIC METABOLIC PANEL
Anion gap: 12 (ref 5–15)
BUN: 29 mg/dL — ABNORMAL HIGH (ref 8–23)
CO2: 25 mmol/L (ref 22–32)
Calcium: 9.2 mg/dL (ref 8.9–10.3)
Chloride: 98 mmol/L (ref 98–111)
Creatinine, Ser: 1.32 mg/dL — ABNORMAL HIGH (ref 0.44–1.00)
GFR, Estimated: 43 mL/min — ABNORMAL LOW (ref >60)
Glucose, Bld: 199 mg/dL — ABNORMAL HIGH (ref 70–99)
Potassium: 4.5 mmol/L (ref 3.5–5.1)
Sodium: 135 mmol/L (ref 135–145)

## 2020-05-03 LAB — URINALYSIS, ROUTINE W REFLEX MICROSCOPIC
Bilirubin Urine: NEGATIVE
Glucose, UA: NEGATIVE mg/dL
Hgb urine dipstick: NEGATIVE
Ketones, ur: NEGATIVE mg/dL
Leukocytes,Ua: NEGATIVE
Nitrite: NEGATIVE
Protein, ur: NEGATIVE mg/dL
Specific Gravity, Urine: 1.014 (ref 1.005–1.030)
pH: 6 (ref 5.0–8.0)

## 2020-05-03 LAB — D-DIMER, QUANTITATIVE: D-Dimer, Quant: 7.98 ug/mL-FEU — ABNORMAL HIGH (ref 0.00–0.50)

## 2020-05-03 LAB — CBC
HCT: 39.2 % (ref 36.0–46.0)
Hemoglobin: 12.5 g/dL (ref 12.0–15.0)
MCH: 30.3 pg (ref 26.0–34.0)
MCHC: 31.9 g/dL (ref 30.0–36.0)
MCV: 95.1 fL (ref 80.0–100.0)
Platelets: 182 10*3/uL (ref 150–400)
RBC: 4.12 MIL/uL (ref 3.87–5.11)
RDW: 16 % — ABNORMAL HIGH (ref 11.5–15.5)
WBC: 10 10*3/uL (ref 4.0–10.5)
nRBC: 0 % (ref 0.0–0.2)

## 2020-05-03 LAB — RESP PANEL BY RT-PCR (FLU A&B, COVID) ARPGX2
Influenza A by PCR: NEGATIVE
Influenza B by PCR: NEGATIVE
SARS Coronavirus 2 by RT PCR: NEGATIVE

## 2020-05-03 LAB — PROCALCITONIN: Procalcitonin: 0.11 ng/mL

## 2020-05-03 LAB — LACTIC ACID, PLASMA
Lactic Acid, Venous: 3.3 mmol/L (ref 0.5–1.9)
Lactic Acid, Venous: 3.4 mmol/L (ref 0.5–1.9)

## 2020-05-03 LAB — OSMOLALITY: Osmolality: 293 mOsm/kg (ref 275–295)

## 2020-05-03 LAB — SODIUM, URINE, RANDOM: Sodium, Ur: 13 mmol/L

## 2020-05-03 LAB — OSMOLALITY, URINE: Osmolality, Ur: 428 mOsm/kg (ref 300–900)

## 2020-05-03 LAB — MAGNESIUM: Magnesium: 1.4 mg/dL — ABNORMAL LOW (ref 1.7–2.4)

## 2020-05-03 LAB — MRSA PCR SCREENING: MRSA by PCR: NEGATIVE

## 2020-05-03 LAB — URIC ACID: Uric Acid, Serum: 10.6 mg/dL — ABNORMAL HIGH (ref 2.5–7.1)

## 2020-05-03 LAB — BRAIN NATRIURETIC PEPTIDE: B Natriuretic Peptide: 69.5 pg/mL (ref 0.0–100.0)

## 2020-05-03 MED ORDER — ROSUVASTATIN CALCIUM 20 MG PO TABS
20.0000 mg | ORAL_TABLET | Freq: Every day | ORAL | Status: DC
  Administered 2020-05-03 (×2): 20 mg via ORAL
  Filled 2020-05-03 (×2): qty 1

## 2020-05-03 MED ORDER — INSULIN NPH (HUMAN) (ISOPHANE) 100 UNIT/ML ~~LOC~~ SUSP
50.0000 [IU] | Freq: Two times a day (BID) | SUBCUTANEOUS | Status: DC
  Administered 2020-05-03 – 2020-05-04 (×3): 50 [IU] via SUBCUTANEOUS
  Filled 2020-05-03 (×2): qty 10

## 2020-05-03 MED ORDER — MELOXICAM 7.5 MG PO TABS
7.5000 mg | ORAL_TABLET | Freq: Every day | ORAL | Status: DC
  Filled 2020-05-03: qty 1

## 2020-05-03 MED ORDER — LIDOCAINE 5 % EX PTCH
1.0000 | MEDICATED_PATCH | Freq: Every day | CUTANEOUS | Status: DC
  Administered 2020-05-03 – 2020-05-04 (×2): 1 via TRANSDERMAL
  Filled 2020-05-03 (×2): qty 1

## 2020-05-03 MED ORDER — TORSEMIDE 20 MG PO TABS: 50.0000 mg | ORAL_TABLET | Freq: Every day | ORAL | Status: DC

## 2020-05-03 MED ORDER — LACTATED RINGERS IV BOLUS
500.0000 mL | Freq: Once | INTRAVENOUS | Status: AC
  Administered 2020-05-03: 500 mL via INTRAVENOUS

## 2020-05-03 MED ORDER — UMECLIDINIUM BROMIDE 62.5 MCG/INH IN AEPB
1.0000 | INHALATION_SPRAY | Freq: Every day | RESPIRATORY_TRACT | Status: DC
  Administered 2020-05-03 – 2020-05-04 (×2): 1 via RESPIRATORY_TRACT
  Filled 2020-05-03: qty 7

## 2020-05-03 MED ORDER — ACETAMINOPHEN 325 MG PO TABS
650.0000 mg | ORAL_TABLET | Freq: Four times a day (QID) | ORAL | Status: DC | PRN
  Administered 2020-05-03 – 2020-05-04 (×2): 650 mg via ORAL
  Filled 2020-05-03 (×2): qty 2

## 2020-05-03 MED ORDER — ONDANSETRON HCL 4 MG PO TABS
4.0000 mg | ORAL_TABLET | Freq: Four times a day (QID) | ORAL | Status: DC | PRN
  Administered 2020-05-03: 4 mg via ORAL
  Filled 2020-05-03: qty 1

## 2020-05-03 MED ORDER — CARVEDILOL 12.5 MG PO TABS
12.5000 mg | ORAL_TABLET | Freq: Two times a day (BID) | ORAL | Status: DC
  Administered 2020-05-03 – 2020-05-04 (×3): 12.5 mg via ORAL
  Filled 2020-05-03 (×3): qty 1

## 2020-05-03 MED ORDER — ONDANSETRON HCL 4 MG/2ML IJ SOLN
4.0000 mg | Freq: Four times a day (QID) | INTRAMUSCULAR | Status: DC | PRN
  Administered 2020-05-03 (×2): 4 mg via INTRAVENOUS
  Filled 2020-05-03 (×2): qty 2

## 2020-05-03 MED ORDER — RANOLAZINE ER 500 MG PO TB12
500.0000 mg | ORAL_TABLET | Freq: Two times a day (BID) | ORAL | Status: DC
  Administered 2020-05-03 – 2020-05-04 (×4): 500 mg via ORAL
  Filled 2020-05-03 (×7): qty 1

## 2020-05-03 MED ORDER — FLUTICASONE PROPIONATE 50 MCG/ACT NA SUSP: 1.0000 | Freq: Every day | NASAL | Status: DC | PRN

## 2020-05-03 MED ORDER — AMLODIPINE BESYLATE 5 MG PO TABS
5.0000 mg | ORAL_TABLET | Freq: Every day | ORAL | Status: DC
  Administered 2020-05-03 – 2020-05-04 (×2): 5 mg via ORAL
  Filled 2020-05-03 (×2): qty 1

## 2020-05-03 MED ORDER — INSULIN ASPART 100 UNIT/ML ~~LOC~~ SOLN
0.0000 [IU] | Freq: Three times a day (TID) | SUBCUTANEOUS | Status: DC
  Administered 2020-05-03 (×3): 4 [IU] via SUBCUTANEOUS
  Administered 2020-05-04: 3 [IU] via SUBCUTANEOUS

## 2020-05-03 MED ORDER — ENOXAPARIN SODIUM 40 MG/0.4ML ~~LOC~~ SOLN
40.0000 mg | Freq: Every day | SUBCUTANEOUS | Status: DC
  Administered 2020-05-03: 40 mg via SUBCUTANEOUS
  Filled 2020-05-03: qty 0.4

## 2020-05-03 MED ORDER — LACTATED RINGERS IV SOLN: INTRAVENOUS | Status: DC

## 2020-05-03 MED ORDER — POLYETHYLENE GLYCOL 3350 17 G PO PACK: 17.0000 g | PACK | Freq: Every day | ORAL | Status: DC | PRN

## 2020-05-03 MED ORDER — ASPIRIN EC 81 MG PO TBEC
81.0000 mg | DELAYED_RELEASE_TABLET | Freq: Every day | ORAL | Status: DC
  Administered 2020-05-03 – 2020-05-04 (×2): 81 mg via ORAL
  Filled 2020-05-03 (×2): qty 1

## 2020-05-03 MED ORDER — INSULIN ASPART 100 UNIT/ML ~~LOC~~ SOLN
6.0000 [IU] | Freq: Three times a day (TID) | SUBCUTANEOUS | Status: DC
  Administered 2020-05-03 – 2020-05-04 (×4): 6 [IU] via SUBCUTANEOUS

## 2020-05-03 MED ORDER — MAGNESIUM SULFATE 2 GM/50ML IV SOLN
2.0000 g | Freq: Once | INTRAVENOUS | Status: AC
  Administered 2020-05-03: 2 g via INTRAVENOUS
  Filled 2020-05-03: qty 50

## 2020-05-03 MED ORDER — TIOTROPIUM BROMIDE MONOHYDRATE 18 MCG IN CAPS: 18.0000 ug | ORAL_CAPSULE | Freq: Every day | RESPIRATORY_TRACT | Status: DC

## 2020-05-03 MED ORDER — CARVEDILOL 25 MG PO TABS: 25.0000 mg | ORAL_TABLET | Freq: Two times a day (BID) | ORAL | Status: DC

## 2020-05-03 MED ORDER — ACETAMINOPHEN 650 MG RE SUPP: 650.0000 mg | Freq: Four times a day (QID) | RECTAL | Status: DC | PRN

## 2020-05-03 MED ORDER — LISINOPRIL 10 MG PO TABS: 10.0000 mg | ORAL_TABLET | Freq: Every day | ORAL | Status: DC

## 2020-05-03 MED ORDER — MELOXICAM 7.5 MG PO TABS
7.5000 mg | ORAL_TABLET | Freq: Every day | ORAL | Status: DC
  Administered 2020-05-03: 7.5 mg via ORAL
  Filled 2020-05-03: qty 1

## 2020-05-03 MED ORDER — ENOXAPARIN SODIUM 80 MG/0.8ML ~~LOC~~ SOLN
75.0000 mg | Freq: Every day | SUBCUTANEOUS | Status: DC
  Administered 2020-05-03: 75 mg via SUBCUTANEOUS
  Filled 2020-05-03: qty 0.8

## 2020-05-03 MED ORDER — HYDRALAZINE HCL 20 MG/ML IJ SOLN: 10.0000 mg | Freq: Four times a day (QID) | INTRAMUSCULAR | Status: DC | PRN

## 2020-05-03 MED ORDER — ALBUTEROL SULFATE HFA 108 (90 BASE) MCG/ACT IN AERS
2.0000 | INHALATION_SPRAY | Freq: Four times a day (QID) | RESPIRATORY_TRACT | Status: DC | PRN
  Administered 2020-05-03: 2 via RESPIRATORY_TRACT
  Filled 2020-05-03: qty 6.7

## 2020-05-03 MED ORDER — FLUTICASONE FUROATE-VILANTEROL 200-25 MCG/INH IN AEPB
1.0000 | INHALATION_SPRAY | Freq: Every day | RESPIRATORY_TRACT | Status: DC
  Administered 2020-05-03 – 2020-05-04 (×2): 1 via RESPIRATORY_TRACT
  Filled 2020-05-03: qty 28

## 2020-05-03 NOTE — Evaluation (Signed)
Physical Therapy Evaluation Patient Details Name: [name] MRN: [id number] DOB: [birth year] Today's Date: 05/03/2020   History of Present Illness  75 y.o. female pt with initial headache after receiving 4th dose of COVID vaccine headache resolved however presents to ED 05/03/18 with fever, chills, nausea and SoB placed on observation status.  Dx:  AKI caused by possible febrile illness due to COVID -19 vaccine reaction along with being on high doses of diuretics.   TFT:DDUKG+ 2020 SaO2 mid 80s -mid 90s% on RA OSA on CPAP, dCHF, DM2, CAD, HTN, psoriasis  Clinical Impression  PTA pt living at [facility] ALF Pt ambulating room level distances with Rollator, unable to perform self pericare at toilet, wears briefs and takes shower once daily. Due to decreased mobility requires delivery of meals to room. Pt is limited in safe mobility by increased O2 demand and 4/4 DoE with activity in presence of generalized weakness and obesity. Pt with decreased insight into effect of deficits on ability to safely return to her PLOF. Pt is min A for transfers and ambulation with Rollator, however after 20 feet ambulation pt requires seated rest break. Pt SaO2 on RA 95%O2 at rest and 85% O2 with mobility which is her reported baseline. PT recommending SNF level rehab at discharge however pt refuses as "I will be fine after I eat, after I get home, after I get over the vaccine, etc." If pt does not go to rehab she will need HHPT at her facility. PT will continue to follow acutely.     Follow Up Recommendations Home health PT;Supervision - Intermittent    Equipment Recommendations  Other (comment) (has necessary equipment)       Precautions / Restrictions Precautions Precautions: Fall Precaution Comments: Pt fatigues very quickly with activity      Mobility  Bed Mobility               General bed mobility comments: OOB in recliner on entry    Transfers Overall transfer level: Needs  assistance Equipment used: 1 person hand held assist Transfers: Sit to/from [facility] Sit to Stand: Min assist Stand pivot transfers: Min assist       General transfer comment: assist to steady  Ambulation/Gait Ambulation/Gait assistance: Min assist;+2 safety/equipment Gait Distance (Feet): 20 Feet Assistive device: 4-wheeled walker (Bariatric) Gait Pattern/deviations: Step-through pattern;Decreased step length - right;Decreased step length - left;Shuffle;Wide base of support Gait velocity: slowed Gait velocity interpretation: <1.8 ft/sec, indicate of risk for recurrent falls General Gait Details: min A for steadying with slow, waddling gait, mildly unsteady no LoB      Balance Overall balance assessment: Needs assistance Sitting-balance support: Feet supported Sitting balance-Leahy Scale: Fair     Standing balance support: Bilateral upper extremity supported Standing balance-Leahy Scale: Poor Standing balance comment: requires UE support                             Pertinent Vitals/Pain Pain Assessment: No/denies pain    Home Living Family/patient expects to be discharged to::  (ILF)     Type of Home: Independent living facility Home Access: Elevator     Home Layout: One level Home Equipment: Toilet riser;Walker - 4 wheels Additional Comments: Pt reports she is waiting on a power w/c and it should be available in ~ 90mos.  She lives in an independent living apartment at [place]. She moved there ~ 1year ago from the [place] area.  She has no family  in [place] area    Prior Function Level of Independence: Independent with assistive device(s)         Comments: Pt reports she ambulates short distances with her rollator, but fatigues quickly.  She says the elevator is at the other end of the hallway and she is unable to walk that far so staff assist her out of the building in a w/c when she has to go to appointments.  All meals are  delivered to her apartment.  She reports that she walks a short distance down the hall to do her laundry.  She also reports she is unable to adequately perform peri hygiene and therefore wears pads/briefs for the day, then removes them and takes a shower to clean off.     Hand Dominance   Dominant Hand: Right    Extremity/Trunk Assessment   Upper Extremity Assessment Upper Extremity Assessment: Defer to OT evaluation    Lower Extremity Assessment Lower Extremity Assessment: Generalized weakness    Cervical / Trunk Assessment Cervical / Trunk Assessment: Other exceptions Cervical / Trunk Exceptions: morbidly obese  Communication   Communication: No difficulties  Cognition Arousal/Alertness: Awake/alert Behavior During Therapy: WFL for tasks assessed/performed Overall Cognitive Status: Within Functional Limits for tasks assessed                                 General Comments: Pt demonstrates decreased awareness of how her current deficits may impact her functionally at home - demonstrates difficulty generalizing this info. Anticipate this is her baseline      General Comments General comments (skin integrity, edema, etc.): SaO2 on RA in seated 95%O2, with ambulation on RA drops to 85%O2, ambulation on 2L via [place] SaO2 92%O2        Assessment/Plan    PT Assessment Patient needs continued PT services  PT Problem List Obesity;Decreased activity tolerance;Decreased mobility;Decreased balance;Decreased safety awareness;Cardiopulmonary status limiting activity;Decreased cognition;Decreased coordination       PT Treatment Interventions DME instruction;Gait training;Functional mobility training;Therapeutic activities;Therapeutic exercise;Balance training;Cognitive remediation;Patient/family education    PT Goals (Current goals can be found in the Care Plan section)  Acute Rehab PT Goals Patient Stated Goal: to go home tomorrow PT Goal Formulation: With  patient Time For Goal Achievement: 05/17/20 Potential to Achieve Goals: Fair    Frequency Min 3X/week   Barriers to discharge Decreased caregiver support         AM-PAC PT "6 Clicks" Mobility  Outcome Measure Help needed turning from your back to your side while in a flat bed without using bedrails?: A Little Help needed moving from lying on your back to sitting on the side of a flat bed without using bedrails?: A Little Help needed moving to and from a bed to a chair (including a wheelchair)?: A Little Help needed standing up from a chair using your arms (e.g., wheelchair or bedside chair)?: A Little Help needed to walk in hospital room?: A Little Help needed climbing 3-5 steps with a railing? : Total 6 Click Score: 16    End of Session Equipment Utilized During Treatment: Oxygen Activity Tolerance: Patient tolerated treatment well Patient left: in chair;with call bell/phone within reach Nurse Communication: Mobility status PT Visit Diagnosis: Unsteadiness on feet (R26.81);Other abnormalities of gait and mobility (R26.89);Muscle weakness (generalized) (M62.81);Difficulty in walking, not elsewhere classified (R26.2)    Time: 2595-6387 PT Time Calculation (min) (ACUTE ONLY): 28 min   Charges:   PT  Evaluation $PT Eval Moderate Complexity: 1 Mod PT Treatments $Therapeutic Activity: 8-22 mins        Musab Wingard B. Migdalia Dk PT, DPT Acute Rehabilitation Services Pager [phone] Office [phone]   [place] 05/03/2020, 12:39 PM

## 2020-05-03 NOTE — Progress Notes (Signed)
[name], is a 75 y.o. female, DOB - 1945/12/05, UVO:[id number]  Patient admitted few hours ago with dehydration and AKI caused by possible febrile illness due to COVID-19 vaccine reaction along with being on high doses of diuretics, will be admitted overnight, receive IV fluids, replace magnesium, hold offending medications and monitor.  Appears nontoxic.   Vitals:   05/03/20 0635 05/03/20 0800 05/03/20 0818 05/03/20 1132  BP:    (!) 98/56  Pulse:    93  Resp:    17  Temp: 98.3 F (36.8 C)   98.7 F (37.1 C)  TempSrc: Oral   Oral  SpO2:  93% 93% 95%  Weight:      Height:            Data Review   Micro Results Recent Results (from the past 240 hour(s))  Resp Panel by RT-PCR (Flu A&B, Covid)     Status: None   Collection Time: 05/02/20 10:08 PM  Result Value Ref Range Status   SARS Coronavirus 2 by RT PCR NEGATIVE NEGATIVE Final    Comment: (NOTE) SARS-CoV-2 target nucleic acids are NOT DETECTED.  The SARS-CoV-2 RNA is generally detectable in upper respiratory specimens during the acute phase of infection. The lowest concentration of SARS-CoV-2 viral copies this assay can detect is 138 copies/mL. A negative result does not preclude SARS-Cov-2 infection and should not be used as the sole basis for treatment or other patient management decisions. A negative result may occur with  improper specimen collection/handling, submission of specimen other than nasopharyngeal swab, presence of viral mutation(s) within the areas targeted by this assay, and inadequate number of viral copies(<138 copies/mL). A negative result must be combined with clinical observations, patient history, and epidemiological information. The expected result is Negative.  Fact Sheet for Patients:  EntrepreneurPulse.com.au  Fact Sheet for Healthcare Providers:   IncredibleEmployment.be  This test is no t yet approved or cleared by the [place] FDA and  has been authorized for detection and/or diagnosis of SARS-CoV-2 by FDA under an Emergency Use Authorization (EUA). This EUA will remain  in effect (meaning this test can be used) for the duration of the COVID-19 declaration under Section 564(b)(1) of the Act, 21 U.S.C.section 360bbb-3(b)(1), unless the authorization is terminated  or revoked sooner.       Influenza A by PCR NEGATIVE NEGATIVE Final   Influenza B by PCR NEGATIVE NEGATIVE Final    Comment: (NOTE) The Xpert Xpress SARS-CoV-2/FLU/RSV plus assay is intended as an aid in the diagnosis of influenza from Nasopharyngeal swab specimens and should not be used as a sole basis for treatment. Nasal washings and aspirates are unacceptable for Xpert Xpress SARS-CoV-2/FLU/RSV testing.  Fact Sheet for Patients: EntrepreneurPulse.com.au  Fact Sheet for Healthcare Providers: IncredibleEmployment.be  This test is not yet approved or cleared by the [place] FDA and has been authorized for detection and/or diagnosis of SARS-CoV-2 by FDA under an Emergency Use Authorization (EUA). This EUA will remain in effect (meaning this test can be used) for the duration of the COVID-19 declaration under Section 564(b)(1) of the Act, 21 U.S.C. section 360bbb-3(b)(1), unless the authorization is terminated or revoked.  Performed at [facility], [place] [address] [place], [place] [address]   MRSA PCR Screening     Status: None   Collection Time: 05/03/20  4:24 AM   Specimen: Nasal Mucosa; Nasopharyngeal  Result Value Ref Range Status   MRSA by PCR NEGATIVE NEGATIVE Final    Comment:  The GeneXpert MRSA Assay (FDA approved for NASAL specimens only), is one component of a comprehensive MRSA colonization surveillance program. It is not intended to diagnose MRSA infection nor  to guide or monitor treatment for MRSA infections. Performed at [facility], [place] [address], [place], [place] [address]     Radiology Reports DG Chest [place] 1 View  Result Date: 05/03/2020 CLINICAL DATA:  Shortness of breath EXAM: PORTABLE CHEST 1 VIEW COMPARISON:  May 02, 2020 FINDINGS: The mediastinal contour is stable. The heart size is enlarged. Increased pulmonary interstitium is identified bilaterally. There is probable minimal left pleural effusion. Patchy consolidation of lateral left lung base is identified. The visualized skeletal structures are unremarkable. IMPRESSION: 1. Congestive heart failure worsened compared to prior exam. 2. Patchy consolidation of lateral left lung base, superimposed pneumonia is not excluded. Electronically Signed   By: Abelardo Diesel M.D.   On: 05/03/2020 08:42   DG Chest Port 1 View  Result Date: 05/02/2020 CLINICAL DATA:  Shortness of breath EXAM: PORTABLE CHEST 1 VIEW COMPARISON:  None. FINDINGS: Cardiomegaly, vascular congestion. No overt edema. No confluent opacities or effusions. No acute bony abnormality. IMPRESSION: Cardiomegaly, vascular congestion. Electronically Signed   By: Rolm Baptise M.D.   On: 05/02/2020 22:11   Exam  Awake Alert, No new F.N deficits, Normal affect [place].AT,PERRAL Supple Neck,No JVD, No cervical lymphadenopathy appriciated.  Symmetrical Chest wall movement, Good air movement bilaterally, CTAB RRR,No Gallops, Rubs or new Murmurs, No Parasternal Heave +ve B.Sounds, Abd Soft, No tenderness, No organomegaly appriciated, No rebound - guarding or rigidity. No Cyanosis, Clubbing, trace edema  CBC Recent Labs  Lab 05/02/20 2135 05/03/20 0358  WBC 10.0 10.0  HGB 13.4 12.5  HCT 41.9 39.2  PLT 199 182  MCV 94.2 95.1  MCH 30.1 30.3  MCHC 32.0 31.9  RDW 15.8* 16.0*  LYMPHSABS 0.4*  --   MONOABS 0.6  --   EOSABS 0.1  --   BASOSABS 0.0  --     Chemistries  Recent Labs  Lab 05/02/20 2135 05/03/20 0358  05/03/20 0749  NA 136 135  --   K 4.5 4.5  --   CL 98 98  --   CO2 30 25  --   GLUCOSE 186* 199*  --   BUN 30* 29*  --   CREATININE 1.21* 1.32*  --   CALCIUM 9.2 9.2  --   MG  --   --  1.4*   ------------------------------------------------------------------------------------------------------------------ estimated creatinine clearance is 55.9 mL/min (A) (by C-G formula based on SCr of 1.32 mg/dL (H)). ------------------------------------------------------------------------------------------------------------------ No results for input(s): HGBA1C in the last 72 hours. ------------------------------------------------------------------------------------------------------------------ No results for input(s): CHOL, HDL, LDLCALC, TRIG, CHOLHDL, LDLDIRECT in the last 72 hours. ------------------------------------------------------------------------------------------------------------------ No results for input(s): TSH, T4TOTAL, T3FREE, THYROIDAB in the last 72 hours.  Invalid input(s): FREET3 ------------------------------------------------------------------------------------------------------------------ No results for input(s): VITAMINB12, FOLATE, FERRITIN, TIBC, IRON, RETICCTPCT in the last 72 hours.  Coagulation profile No results for input(s): INR, PROTIME in the last 168 hours.  Recent Labs    05/03/20 0358  DDIMER 7.98*    Cardiac Enzymes No results for input(s): CKMB, TROPONINI, MYOGLOBIN in the last 168 hours.  Invalid input(s): CK ------------------------------------------------------------------------------------------------------------------ Invalid input(s): POCBNP   Signature  Lala Lund M.D on 05/03/2020 at 11:34 AM   -  To page go to [email or web address]

## 2020-05-03 NOTE — Progress Notes (Signed)
Patient refused to the lab draw this morning.

## 2020-05-03 NOTE — Progress Notes (Signed)
Patient received to the unit. Patient is alert and oriented x 4. Vital signs are stable. Iv in place. Skin assessment done with another nurse. No skin breakdown noted on examination. Given instructions about call bell and phone. Bed in low position and call bell in reach.

## 2020-05-03 NOTE — Progress Notes (Signed)
RT attempted ABG but was unsuccessful.

## 2020-05-03 NOTE — H&P (Addendum)
History and Physical    [name] ZTI:[id number] DOB: Mar 25, 1945 DOA: 05/02/2020  PCP: Lajean Manes, MD  Patient coming from: ALF  I have personally briefly reviewed patient's old medical records in [place]  Chief Complaint: SOB  HPI: [name] is a 75 y.o. female with medical history significant of OSA on CPAP, dCHF, DM2, CAD, HTN, psoriasis.  Pt had COVID-19 in 2020 requiring admission to hospital.  Has been having difficulty with breathing since that time.  Not on home O2 but apparently baseline O2 sats since that time is anywhere from mid 80s to 94% on RA (fluctuates).  Pt had COVID-19 booster shot yesterday (4th dose of vaccine).  After this pt developed headache.  Today headache resolved but pt has developed fevers, chills, nausea, SOB.  Symptoms are persistent, moderate.  Nothing seems to make better or worse.  No cough, no LE swelling, no abd pain, no dysuria.   ED Course: Nl WBC, lactate of 3.0 then 3.3.  Pt given 40mg  IV lasix.  Then pt given 500 NS bolus.  BNP is nl.  CXR neg.  ABG unable to be obtained (RRT couldn't get it).  COVID and flu neg.  WBC 10k.  Tm 102.2, HR 105.  Satting 100% on 3L (per RN placed for comfort, pt was satting 92% on RA).   Review of Systems: As per HPI, otherwise all review of systems negative.  Past Medical History:  Diagnosis Date  . Coronary artery disease   . Diabetes mellitus without complication ([place])   . Hyperlipidemia   . Hypertension   . OSA (obstructive sleep apnea)     Past Surgical History:  Procedure Laterality Date  . CARDIAC CATHETERIZATION    . pheochromocytoma    . TONSILLECTOMY       reports that she quit smoking about 30 years ago. She has a 20.00 pack-year smoking history. She has never used smokeless tobacco. She reports previous alcohol use. She reports previous drug use.  Allergies  Allergen Reactions  . Metaxalone Anaphylaxis  . Nitroglycerin     BP bottoms out  .  Tramadol-Acetaminophen Anaphylaxis  . Icosapent Ethyl Other (See Comments)    Stomach pain, gas, diarrhea, headache from Vascepa  . Brompheniramine-Pseudoeph     Feels drunk  . Meprobamate   . Metformin Nausea Only    "flushes"  . Methyldopa     Other reaction(s): Unknown  . Metoprolol     Other reaction(s): Unknown  . Procaine     Passes out  . Pseudoephedrine Hcl     "feel drunk"    Family History  Problem Relation Age of Onset  . Heart attack Mother   . Stroke Mother   . Heart attack Father      Prior to Admission medications   Medication Sig Start Date End Date Taking? Authorizing Provider  acetaminophen (TYLENOL) 500 MG tablet Take 500 mg by mouth every 8 (eight) hours as needed for moderate pain or headache.   Yes [provider]  acetaminophen (TYLENOL) 650 MG CR tablet Take 1,300 mg by mouth in the morning and at bedtime.   Yes [provider]  albuterol (VENTOLIN HFA) 108 (90 Base) MCG/ACT inhaler Inhale 2 puffs into the lungs 4 (four) times daily as needed for wheezing or shortness of breath.   Yes [provider]  amLODipine (NORVASC) 5 MG tablet Take 5 mg by mouth daily.   Yes [provider]  aspirin EC 81 MG tablet Take  81 mg by mouth daily. Swallow whole.   Yes [provider]  BREO ELLIPTA 200-25 MCG/INH AEPB Inhale 1 puff into the lungs daily. 04/22/20  Yes [provider]  carvedilol (COREG) 25 MG tablet Take 25 mg by mouth 2 (two) times daily with a meal.   Yes [provider]  Cholecalciferol (VITAMIN D3) 125 MCG (5000 UT) CAPS Take 5,000 Units by mouth.   Yes [provider]  diclofenac Sodium (VOLTAREN) 1 % GEL Apply 2-4 g topically 4 (four) times daily as needed (pain).   Yes [provider]  ferrous sulfate 325 (65 FE) MG tablet Take 325 mg by mouth every Wednesday.   Yes [provider]  fluticasone (FLONASE) 50 MCG/ACT nasal spray Place 1 spray into both nostrils  daily as needed for allergies or rhinitis.   Yes [provider]  glucose 4 GM chewable tablet Chew 3 tablets by mouth as needed for low blood sugar.   Yes [provider]  glucose blood test strip as directed. Dispense Blood Glucose Test Strips to patient for home use. Testing 4 times daily. 01/16/19  Yes [provider]  insulin NPH-regular Human (70-30) 100 UNIT/ML injection Inject 73 Units into the skin with breakfast, with lunch, and with evening meal.   Yes [provider]  Insulin Syringe-Needle U-100 (INSULIN SYRINGE 1CC/31GX5/16") 31G X 5/16" 1 ML MISC USE TO INJECT INSULIN TWO TIMES A DAY 03/28/19  Yes [provider]  Ixekizumab 80 MG/ML SOAJ Inject into the skin. Give subcutaneous every 4 weeks   Yes [provider]  Lancets 33G MISC as directed. Testing 4 times daily.  Dispense Lancets to patient for home use. 01/16/19  Yes [provider]  lisinopril (ZESTRIL) 10 MG tablet Take 10 mg by mouth daily.   Yes [provider]  melatonin 5 MG TABS 1 tablet in the evening 05/01/20  Yes [provider]  meloxicam (MOBIC) 7.5 MG tablet Take 7.5 mg by mouth in the morning and at bedtime.   Yes [provider]  Multiple Vitamin (MULTIVITAMIN ADULT PO) Take 1 tablet by mouth daily.   Yes [provider]  polyethylene glycol (MIRALAX / GLYCOLAX) 17 g packet Take 17 g by mouth daily as needed for mild constipation.   Yes [provider]  polyethylene glycol powder (GLYCOLAX/MIRALAX) 17 GM/SCOOP powder Take 17 g by mouth daily as needed for mild constipation.   Yes [provider]  ranolazine (RANEXA) 1000 MG SR tablet Take 500 mg by mouth 2 (two) times daily.   Yes [provider]  Respiratory Therapy Supplies [place] as directed. BIPAP 18/14 cm H2O with humidifier, mask, tubing, and headgear. 03/14/19  Yes [provider]  rosuvastatin (CRESTOR) 20 MG tablet Take 20 mg by  mouth at bedtime.   Yes [provider]  tiotropium (SPIRIVA HANDIHALER) 18 MCG inhalation capsule Place 18 mcg into inhaler and inhale daily.   Yes [provider]  torsemide (DEMADEX) 100 MG tablet Take 0.5 tablets (50 mg total) by mouth daily. 02/18/20 05/18/20 Yes O'Neal, Cassie Freer, MD  Turmeric 500 MG CAPS Take 500 mg by mouth daily.   Yes [provider]  vitamin B-12 (CYANOCOBALAMIN) 1000 MCG tablet Take 1,000 mcg by mouth daily.   Yes [provider]    Physical Exam: Vitals:   05/02/20 2133 05/02/20 2347 05/03/20 0000 05/03/20 0042  BP: (!) 148/63 122/70 (!) 115/38 114/62  Pulse: (!) 114 (!) 113 (!) 109 Marland Kitchen)  105  Resp: (!) 27 (!) 26 (!) 26 20  Temp:    100 F (37.8 C)  TempSrc:    Oral  SpO2: 100% 99% 96% 96%    Constitutional: NAD, calm, comfortable Eyes: PERRL, lids and conjunctivae normal ENMT: Mucous membranes are moist. Posterior pharynx clear of any exudate or lesions.Normal dentition.  Neck: normal, supple, no masses, no thyromegaly Respiratory: clear to auscultation bilaterally, no wheezing, no crackles. Normal respiratory effort. No accessory muscle use.  Cardiovascular: Regular rate and rhythm, no murmurs / rubs / gallops. No extremity edema. 2+ pedal pulses. No carotid bruits.  Abdomen: no tenderness, no masses palpated. No hepatosplenomegaly. Bowel sounds positive.  Musculoskeletal: no clubbing / cyanosis. No joint deformity upper and lower extremities. Good ROM, no contractures. Normal muscle tone.  Skin: no rashes, lesions, ulcers. No induration Neurologic: CN 2-12 grossly intact. Sensation intact, DTR normal. Strength 5/5 in all 4.  Psychiatric: Normal judgment and insight. Alert and oriented x 3. Normal mood.    Labs on Admission: I have personally reviewed following labs and imaging studies  CBC: Recent Labs  Lab 05/02/20 2135  WBC 10.0  NEUTROABS 8.9*  HGB 13.4  HCT 41.9  MCV 94.2  PLT 169   Basic Metabolic  Panel: Recent Labs  Lab 05/02/20 2135  NA 136  K 4.5  CL 98  CO2 30  GLUCOSE 186*  BUN 30*  CREATININE 1.21*  CALCIUM 9.2   GFR: CrCl cannot be calculated (Unknown ideal weight.). Liver Function Tests: No results for input(s): AST, ALT, ALKPHOS, BILITOT, PROT, ALBUMIN in the last 168 hours. No results for input(s): LIPASE, AMYLASE in the last 168 hours. No results for input(s): AMMONIA in the last 168 hours. Coagulation Profile: No results for input(s): INR, PROTIME in the last 168 hours. Cardiac Enzymes: No results for input(s): CKTOTAL, CKMB, CKMBINDEX, TROPONINI in the last 168 hours. BNP (last 3 results) No results for input(s): PROBNP in the last 8760 hours. HbA1C: No results for input(s): HGBA1C in the last 72 hours. CBG: No results for input(s): GLUCAP in the last 168 hours. Lipid Profile: No results for input(s): CHOL, HDL, LDLCALC, TRIG, CHOLHDL, LDLDIRECT in the last 72 hours. Thyroid Function Tests: No results for input(s): TSH, T4TOTAL, FREET4, T3FREE, THYROIDAB in the last 72 hours. Anemia Panel: No results for input(s): VITAMINB12, FOLATE, FERRITIN, TIBC, IRON, RETICCTPCT in the last 72 hours. Urine analysis: No results found for: COLORURINE, APPEARANCEUR, LABSPEC, [place], GLUCOSEU, HGBUR, BILIRUBINUR, KETONESUR, PROTEINUR, UROBILINOGEN, NITRITE, LEUKOCYTESUR  Radiological Exams on Admission: DG Chest Port 1 View  Result Date: 05/02/2020 CLINICAL DATA:  Shortness of breath EXAM: PORTABLE CHEST 1 VIEW COMPARISON:  None. FINDINGS: Cardiomegaly, vascular congestion. No overt edema. No confluent opacities or effusions. No acute bony abnormality. IMPRESSION: Cardiomegaly, vascular congestion. Electronically Signed   By: Rolm Baptise M.D.   On: 05/02/2020 22:11    EKG: Independently reviewed.  Assessment/Plan Principal Problem:   SIRS (systemic inflammatory response syndrome) (HCC) Active Problems:   Chronic diastolic (congestive) heart failure (HCC)    Essential hypertension   OSA on CPAP   Psoriasis with arthropathy (HCC)   DM2 (diabetes mellitus, type 2) (HCC)   Chronic respiratory failure with hypoxia and hypercapnia ([place])    1. SIRS - 1. Possibly just Vaccine reaction Vs occult infection. 2. Will hold off on ABx for the moment given the strong possibility of vaccine rxn given timing 3. Gently hydrate pt by holding home torsemide for the moment. 4. UA pending 5.  UCx, BCx 6. Check procalcitonin, D.Dimer. 7. Repeat CBC/BMP in AM 8. Check serial lactates 9. IVF if lactate rises any further 10. ABx if pt worsens any 2. Chronic resp failure with hypoxia and hypercapnea - 1. Sounds like pt with chronic HVOS as well as breathing difficulties since COVID in 2020 2. Cont home CPAP QHS 3. O2 via [place] for now 4. Cont pulse ox 5. Tele monitor 6. CXR without acute finding 3. dCHF - 1. BNP nl 2. Will hold home torsemide for the moment 4. HTN - 1. Cont home BP meds 5. DM2 - 1. Hold home 70/30 2. NPH 50u BID 3. Resistant SSI AC 4. And 6u novolog AC 6. Psoriasis - 1. Hold Ixekizumab  DVT prophylaxis: Lovenox Code Status: Full Family Communication: No family in room Disposition Plan: Home after symptoms improved Consults called: None Admission status: Place in 29  Taneah Masri, [place] [facility]  How to contact the [facility] Attending or Consulting provider [place] or covering provider during after hours [place], for this patient?  1. Check the care team in [facility] and look for a) attending/consulting [facility] provider listed and b) the [facility] team listed 2. Log into [email or web address]  Amion Physician Scheduling and messaging for groups and whole hospitals  On call and physician scheduling software for group practices, residents, hospitalists and other medical providers for call, clinic, rotation and shift schedules. On[employer] is a hospital-wide system for scheduling doctors and paging doctors on call. EasyPlot is for scientific  plotting and data analysis.  [email or web address]  and use [place]'s universal password to access. If you do not have the password, please contact the hospital operator.  3. Locate the [facility] provider you are looking for under [facility] and page to a number that you can be directly reached. 4. If you still have difficulty reaching the provider, please page the [facility] (Director on Call) for the Hospitalists listed on amion for assistance.  05/03/2020, 2:43 AM

## 2020-05-03 NOTE — Evaluation (Signed)
Occupational Therapy Evaluation Patient Details Name: [name] MRN: [id number] DOB: May 12, 1945 Today's Date: 05/03/2020    History of Present Illness 75 y.o. female pt with initial headache after receiving 4th dose of COVID vaccine headache resolved however presents to ED 05/03/18 with fever, chills, nausea and SoB placed on observation status.  Dx:  AKI caused by possible febrile illness due to COVID -19 vaccine reaction along with being on high doses of diuretics.   FYB:OFBPZ+ 2020 SaO2 mid 80s -mid 90s% on RA OSA on CPAP, dCHF, DM2, CAD, HTN, psoriasis   Clinical Impression   Pt admitted with above. She demonstrates the below listed deficits and will benefit from continued OT to maximize safety and independence with BADLs.  Pt presents to OT with generalized weakness, decreased activity tolerance, impaired balance. She currently requires set up assist - max A for ADLs, and min A for functional transfers.  She fatigues quite rapidly with minimal activity.  She performed a stand turn transfer from the [facility] to the recliner with DOE 4/4 and required > 10 mins to recover with Sp02 91% on 2L 02.  She reports she lives in an independent living apartment at [facility] and as been able to ambulate short distances using a rollator, she completes most ADLs mod I, but is unable to access her peri area to perform hygiene after toileting so she therefore wears incontinence briefs and showers when she removes them.  She has meals delivered to her apartment.  She is unable to walk the length of the hallway to use the elevator and only exits the building for appointments with the assistance of staff and a w/c.  Based on how quickly she fatigued with me this date, I have concerns about her ability to safely maneuver in her apartment to complete basic self care tasks safely.  This was discussed with her, but she was unable to generalize how current deficits may impact her function at home.  Optimally feel she would  benefit from SNF level rehab to optimize her function and independence and reduce risk of falls, however, she is observation status, so that may not be an option.  If SNF is not an option, recommend HHOT and HHPT at discharge.   Will follow acutely.        Follow Up Recommendations  SNF;Home health OT    Equipment Recommendations  3 in 1 bedside commode    Recommendations for Other Services       Precautions / Restrictions Precautions Precautions: Fall Precaution Comments: Pt fatigues very quickly with activity      Mobility Bed Mobility               General bed mobility comments: Pt siitting on BSC upon OT entrance    Transfers Overall transfer level: Needs assistance Equipment used: 1 person hand held assist Transfers: Sit to/from Stand;Stand Pivot Transfers Sit to Stand: Min assist Stand pivot transfers: Min assist       General transfer comment: assist to steady    Balance Overall balance assessment: Needs assistance Sitting-balance support: Feet supported Sitting balance-Leahy Scale: Fair     Standing balance support: Bilateral upper extremity supported Standing balance-Leahy Scale: Poor Standing balance comment: requires UE support                           ADL either performed or assessed with clinical judgement   ADL Overall ADL's : Needs assistance/impaired Eating/Feeding: Independent   Grooming:  Wash/dry hands;Wash/dry face;Oral care;Brushing hair;Set up;Sitting   Upper Body Bathing: Set up;Sitting   Lower Body Bathing: Moderate assistance;Sit to/from stand   Upper Body Dressing : Sitting;Set up   Lower Body Dressing: Maximal assistance;Sit to/from stand Lower Body Dressing Details (indicate cue type and reason): Pt fatigues Toilet Transfer: Minimal assistance;Stand-pivot;BSC Toilet Transfer Details (indicate cue type and reason): min A to steady.  She requires increased time and moves very slowly with a stand turn transfer  from [facility] >recliner.  DOE 4/4 with pt requiring > 15 mins to recover Toileting- Clothing Manipulation and Hygiene: Maximal assistance;Sit to/from stand Toileting - Clothing Manipulation Details (indicate cue type and reason): pt unable to access peri area for hygiene.  Began discussion re: use of toileting aid     Functional mobility during ADLs: Minimal assistance       Vision Patient Visual Report: No change from baseline       Perception     Praxis      Pertinent Vitals/Pain Pain Assessment: No/denies pain     Hand Dominance Right   Extremity/Trunk Assessment Upper Extremity Assessment Upper Extremity Assessment: Generalized weakness   Lower Extremity Assessment Lower Extremity Assessment: Defer to PT evaluation   Cervical / Trunk Assessment Cervical / Trunk Assessment: Other exceptions Cervical / Trunk Exceptions: morbidly obese   Communication Communication Communication: No difficulties   Cognition Arousal/Alertness: Awake/alert Behavior During Therapy: WFL for tasks assessed/performed Overall Cognitive Status: Within Functional Limits for tasks assessed                                 General Comments: Pt demonstrates decreased awareness of how her current deficits may impact her functionally at home - demonstrates difficulty generalizing this info. Anticipate this is her baseline   General Comments  Sp02 91% with pt on 2L supplemental 02    Exercises     Shoulder Instructions      Home Living Family/patient expects to be discharged to::  (ILF)     Type of Home: Independent living facility Home Access: Elevator     Home Layout: One level     Bathroom Shower/Tub: Occupational psychologist: Handicapped height     Home Equipment: Toilet riser;Walker - 4 wheels   Additional Comments: Pt reports she is waiting on a power w/c and it should be available in ~ 6mos.  She lives in an independent living apartment at [facility]. She  moved there ~ 1year ago from the [place] area.  She has no family in [place] area      Prior Functioning/Environment Level of Independence: Independent with assistive device(s)        Comments: Pt reports she ambulates short distances with her rollator, but fatigues quickly.  She says the elevator is at the other end of the hallway and she is unable to walk that far so staff assist her out of the building in a w/c when she has to go to appointments.  All meals are delivered to her apartment.  She reports that she walks a short distance down the hall to do her laundry.  She also reports she is unable to adequately perform peri hygiene and therefore wears pads/briefs for the day, then removes them and takes a shower to clean off.        OT Problem List: Decreased strength;Decreased activity tolerance;Impaired balance (sitting and/or standing);Decreased safety awareness;Decreased knowledge of use of DME or  AE;Cardiopulmonary status limiting activity;Obesity      OT Treatment/Interventions: Self-care/ADL training;Therapeutic exercise;Energy conservation;DME and/or AE instruction;Therapeutic activities;Patient/family education;Balance training    OT Goals(Current goals can be found in the care plan section) Acute Rehab OT Goals Patient Stated Goal: to go home tomorrow OT Goal Formulation: With patient Time For Goal Achievement: 05/17/20 Potential to Achieve Goals: Good ADL Goals Pt Will Perform Grooming: with min guard assist;standing Pt Will Perform Upper Body Bathing: with supervision;with set-up;sitting Pt Will Perform Lower Body Bathing: with min guard assist;sit to/from stand;with adaptive equipment Pt Will Perform Upper Body Dressing: with set-up;with supervision;sitting Pt Will Perform Lower Body Dressing: with min guard assist;sit to/from stand;with adaptive equipment Pt Will Transfer to Toilet: with min guard assist;ambulating;regular height toilet;bedside commode;grab bars Pt Will  Perform Toileting - Clothing Manipulation and hygiene: with min assist;sit to/from stand;with adaptive equipment Pt/caregiver will Perform Home Exercise Program: Increased strength;Both right and left upper extremity;With theraband;With Supervision;With written HEP provided  OT Frequency: Min 2X/week   Barriers to D/C: Decreased caregiver support  livs in ILF and no family in area       Co-evaluation PT/OT/SLP Co-Evaluation/Treatment:  (for safety - chair to follow)            AM-PAC OT "6 Clicks" Daily Activity     Outcome Measure Help from another person eating meals?: None Help from another person taking care of personal grooming?: A Little Help from another person toileting, which includes using toliet, bedpan, or urinal?: A Lot Help from another person bathing (including washing, rinsing, drying)?: A Lot Help from another person to put on and taking off regular upper body clothing?: A Little Help from another person to put on and taking off regular lower body clothing?: A Lot 6 Click Score: 16   End of Session Equipment Utilized During Treatment: Oxygen Nurse Communication: Mobility status  Activity Tolerance: Patient limited by fatigue Patient left: in chair;with call bell/phone within reach  OT Visit Diagnosis: Unsteadiness on feet (R26.81);Muscle weakness (generalized) (M62.81)                Time: 2023-3435 OT Time Calculation (min): 44 min Charges:  OT General Charges $OT Visit: 1 Visit OT Evaluation $OT Eval Moderate Complexity: 1 Mod OT Treatments $Self Care/Home Management : 23-37 mins  Nilsa Nutting., OTR/L Acute Rehabilitation Services Pager [phone] Office [place], [place] 05/03/2020, 12:16 PM

## 2020-05-03 NOTE — ED Provider Notes (Signed)
Patient here with SIRS without a source.  Question immune response from COVID booster yesterday.    Having high fevers and increased SOB.  Significant orthopnea.  Increase O2 requirement.  Vascular congestion seen on CXR.  Could be CHF exacerbation.    Lactic is trending up.  Will consult hospitalist for admission.  Appreciate Dr. Alcario Drought for admitting.   Montine Circle, PA-C 05/03/20 Margaretha Glassing    Ripley Fraise, MD 05/03/20 [phone]

## 2020-05-04 DIAGNOSIS — R6510 Systemic inflammatory response syndrome (SIRS) of non-infectious origin without acute organ dysfunction: Secondary | ICD-10-CM | POA: Diagnosis not present

## 2020-05-04 LAB — GLUCOSE, CAPILLARY
Glucose-Capillary: 130 mg/dL — ABNORMAL HIGH (ref 70–99)
Glucose-Capillary: 180 mg/dL — ABNORMAL HIGH (ref 70–99)

## 2020-05-04 LAB — CBC WITH DIFFERENTIAL/PLATELET
Abs Immature Granulocytes: 0 10*3/uL (ref 0.00–0.07)
Basophils Absolute: 0 10*3/uL (ref 0.0–0.1)
Basophils Relative: 0 %
Eosinophils Absolute: 0 10*3/uL (ref 0.0–0.5)
Eosinophils Relative: 0 %
HCT: 36.8 % (ref 36.0–46.0)
Hemoglobin: 11.6 g/dL — ABNORMAL LOW (ref 12.0–15.0)
Lymphocytes Relative: 11 %
Lymphs Abs: 0.6 10*3/uL — ABNORMAL LOW (ref 0.7–4.0)
MCH: 30 pg (ref 26.0–34.0)
MCHC: 31.5 g/dL (ref 30.0–36.0)
MCV: 95.1 fL (ref 80.0–100.0)
Monocytes Absolute: 0.3 10*3/uL (ref 0.1–1.0)
Monocytes Relative: 5 %
Neutro Abs: 4.6 10*3/uL (ref 1.7–7.7)
Neutrophils Relative %: 84 %
Platelets: 161 10*3/uL (ref 150–400)
RBC: 3.87 MIL/uL (ref 3.87–5.11)
RDW: 16.3 % — ABNORMAL HIGH (ref 11.5–15.5)
WBC: 5.5 10*3/uL (ref 4.0–10.5)
nRBC: 0 % (ref 0.0–0.2)
nRBC: 0 /100 WBC

## 2020-05-04 LAB — COMPREHENSIVE METABOLIC PANEL
ALT: 40 U/L (ref 0–44)
AST: 68 U/L — ABNORMAL HIGH (ref 15–41)
Albumin: 3.3 g/dL — ABNORMAL LOW (ref 3.5–5.0)
Alkaline Phosphatase: 40 U/L (ref 38–126)
Anion gap: 9 (ref 5–15)
BUN: 22 mg/dL (ref 8–23)
CO2: 29 mmol/L (ref 22–32)
Calcium: 9.1 mg/dL (ref 8.9–10.3)
Chloride: 98 mmol/L (ref 98–111)
Creatinine, Ser: 1.11 mg/dL — ABNORMAL HIGH (ref 0.44–1.00)
GFR, Estimated: 52 mL/min — ABNORMAL LOW (ref >60)
Glucose, Bld: 125 mg/dL — ABNORMAL HIGH (ref 70–99)
Potassium: 3.9 mmol/L (ref 3.5–5.1)
Sodium: 136 mmol/L (ref 135–145)
Total Bilirubin: 0.6 mg/dL (ref 0.3–1.2)
Total Protein: 6.5 g/dL (ref 6.5–8.1)

## 2020-05-04 LAB — MAGNESIUM: Magnesium: 2 mg/dL (ref 1.7–2.4)

## 2020-05-04 LAB — BRAIN NATRIURETIC PEPTIDE: B Natriuretic Peptide: 69 pg/mL (ref 0.0–100.0)

## 2020-05-04 LAB — C-REACTIVE PROTEIN: CRP: 10.8 mg/dL — ABNORMAL HIGH (ref <1.0)

## 2020-05-04 LAB — PROCALCITONIN: Procalcitonin: 0.1 ng/mL

## 2020-05-04 MED ORDER — AZITHROMYCIN 500 MG PO TABS
500.0000 mg | ORAL_TABLET | Freq: Once | ORAL | Status: AC
  Administered 2020-05-04: 500 mg via ORAL
  Filled 2020-05-04: qty 1

## 2020-05-04 MED ORDER — AZITHROMYCIN 250 MG PO TABS: 250.0000 mg | ORAL_TABLET | Freq: Every day | ORAL | 0 refills | Status: DC

## 2020-05-04 MED ORDER — ORAL CARE MOUTH RINSE
15.0000 mL | Freq: Two times a day (BID) | OROMUCOSAL | Status: DC
  Administered 2020-05-04: 15 mL via OROMUCOSAL

## 2020-05-04 NOTE — Care Management Obs Status (Signed)
[place] NOTIFICATION   Patient Details  Name: [name] MRN: [id number] Date of Birth: 1945/11/22   Medicare Observation Status Notification Given:  Yes    Norina Buzzard, RN 05/04/2020, 11:03 AM

## 2020-05-04 NOTE — Progress Notes (Signed)
Occupational Therapy Treatment Patient Details Name: [name] MRN: [id number] DOB: [birth year] Today's Date: 05/04/2020    History of present illness 75 y.o. female pt with initial headache after receiving 4th dose of COVID vaccine headache resolved however presents to ED 05/03/18 with fever, chills, nausea and SoB placed on observation status.  Dx:  AKI caused by possible febrile illness due to COVID -19 vaccine reaction along with being on high doses of diuretics.   KJZ:PHXTA+ 2020 SaO2 mid 80s -mid 90s% on RA OSA on CPAP, dCHF, DM2, CAD, HTN, psoriasis   OT comments  Pt making steady progress towards OT goals this session. Pt received seated in recliner agreeable to OT intervention. Pt continues to present with decreased activity tolerance, generalized deconditioning and dyspnea with exertion. Pt currently requires MIN A for household distance functional mobility with Rw on 2L [place] with SpO2 dropping to 88% after ~ 20 ft. Pt currently requires total A for LB ADLs, demo'ed use of toileting tongs with posterior pericare with pt verbalizing understanding. Pt anticipates DC back to ILF today, strongly recommend HHOT pending DC to ILF to help facilitate improvements in activity tolerance and implement energy conservation strategies for home.Will follow acutely per POC.    Follow Up Recommendations  SNF;Home health OT    Equipment Recommendations  3 in 1 bedside commode    Recommendations for Other Services      Precautions / Restrictions Precautions Precautions: Fall Precaution Comments: Pt fatigues very quickly with activity Restrictions Weight Bearing Restrictions: No       Mobility Bed Mobility               General bed mobility comments: OOB in recliner on entry, pt reports she slept in recliner as she can't sleep flat    Transfers Overall transfer level: Needs assistance Equipment used: Rolling walker (2 wheeled) Transfers: Sit to/from Stand Sit to Stand: Min guard          General transfer comment: min guard for recliner for initial steadying assist    Balance Overall balance assessment: Needs assistance Sitting-balance support: Feet supported Sitting balance-Leahy Scale: Fair     Standing balance support: Bilateral upper extremity supported Standing balance-Leahy Scale: Poor Standing balance comment: requires UE support                           ADL either performed or assessed with clinical judgement   ADL Overall ADL's : Needs assistance/impaired                     Lower Body Dressing: Total assistance;Sitting/lateral leans Lower Body Dressing Details (indicate cue type and reason): to adjust socks from EOB, pt reports she wears slide ons at home Toilet Transfer: Minimal assistance;BSC;RW;Ambulation Toilet Transfer Details (indicate cue type and reason): simulated via functiona mobility to doorway and back to recliner~ 83ft with Rw,pt on 2L during ambulation with SpO2 dropping to 88%, pt noticeably dyspneic needing ~ 5 mins to recover   Toileting - Clothing Manipulation Details (indicate cue type and reason): education and demo provided on toileting tongs for hygiene with pt verbalizing understanding     Functional mobility during ADLs: Minimal assistance;Rolling walker General ADL Comments: pt continues to present with decreased activity tolerance, dyspnea with exertion, and generalized deconditioning     Vision       Perception     Praxis      Cognition Arousal/Alertness: Awake/alert Behavior During Therapy: [facility]  for tasks assessed/performed Overall Cognitive Status: Within Functional Limits for tasks assessed                                          Exercises     Shoulder Instructions       General Comments pt on 2L during session with SpO2 dropping to 88% post ambulation ( ~ 59ft) Pt able to state appropriate O2 levels for home.    Pertinent Vitals/ Pain       Pain Assessment:  Faces Faces Pain Scale: No hurt  Home Living                                          Prior Functioning/Environment              Frequency  Min 2X/week        Progress Toward Goals  OT Goals(current goals can now be found in the care plan section)  Progress towards OT goals: Progressing toward goals  Acute Rehab OT Goals Patient Stated Goal: to go home OT Goal Formulation: With patient Time For Goal Achievement: 05/17/20 Potential to Achieve Goals: Good  Plan Discharge plan remains appropriate;Frequency remains appropriate    Co-evaluation                 AM-PAC OT "6 Clicks" Daily Activity     Outcome Measure   Help from another person eating meals?: None Help from another person taking care of personal grooming?: A Little Help from another person toileting, which includes using toliet, bedpan, or urinal?: A Lot Help from another person bathing (including washing, rinsing, drying)?: A Lot Help from another person to put on and taking off regular upper body clothing?: A Little Help from another person to put on and taking off regular lower body clothing?: A Lot 6 Click Score: 16    End of Session Equipment Utilized During Treatment: Oxygen;Rolling walker;Other (comment) (2L [place])  OT Visit Diagnosis: Unsteadiness on feet (R26.81);Muscle weakness (generalized) (M62.81)   Activity Tolerance Patient tolerated treatment well   Patient Left in chair;with call bell/phone within reach   Nurse Communication Mobility status        Time: 6387-5643 OT Time Calculation (min): 27 min  Charges: OT General Charges $OT Visit: 1 Visit OT Treatments $Self Care/Home Management : 23-37 mins  [name]., COTA/L Acute Rehabilitation Services [phone]    [name] 05/04/2020, 10:19 AM

## 2020-05-04 NOTE — Progress Notes (Signed)
Patient discharged home.  Discharge instructions explained, patient verbalizes understanding.  Prescription given. Patient went home with oxygen.

## 2020-05-04 NOTE — Discharge Summary (Signed)
[name] PPJ:[id number] DOB: 1945-05-13 DOA: 05/02/2020  PCP: Lajean Manes, MD  Admit date: 05/02/2020  Discharge date: 05/04/2020  Admitted From: Home  Disposition:  Home   Recommendations for Outpatient Follow-up:   Follow up with PCP in 1-2 weeks  PCP Please obtain BMP/CBC, 2 view CXR in 1week,  (see Discharge instructions)   PCP Please follow up on the following pending results: Check CBC, CMP 2 view chest x-ray in 7 to 10 days.  Monitor glycemic control.   Home Health: PT, RN   Equipment/Devices: 2lit o2  Consultations: None  Discharge Condition: Stable    CODE STATUS: Full    Diet Recommendation: Heart Healthy Low Carb  Diet Order            Diet Carb Modified Fluid consistency: Thin; Room service appropriate? Yes  Diet effective now                  Chief Complaint  Patient presents with  . Shortness of Breath    EMS arrival SOB. Sxs onset headache yesterday. SOB today. Came from assisted living facility.     Brief history of present illness from the day of admission and additional interim summary    [name] is a 75 y.o. female with medical history significant of OSA on CPAP, dCHF, DM2, CAD, HTN, psoriasis, she presented to the hospital with chief complaints of mild shortness of breath and feeling weak, she incidentally had Covid 19 vaccine the day before her visit, subsequently she felt poorly.  In the ER she was diagnosed with severe dehydration, AKI and possible URI/pneumonia                                                                 Hospital Course    1.  Post COVID-19 vaccine fourth booster shot - fever, dehydration along with possible URI/pneumonia.  I think most of her problems were due to development of fever after the vaccine which also dehydration and AKI, chest x-ray  inconclusive, no cough but cannot rule out small community-acquired pneumonia.  She was hydrated with IV fluids, diuretics were held, she was placed on azithromycin, now she is feeling back to baseline and wants to go home.  Note I think infiltrate on x-ray most likely is atelectasis, will request PCP to repeat CBC, CMP and a two-view chest x-ray in 5 to 7 days.  She will get 5-day course of azithromycin.  Now back to her home medications.  Of note upon ambulation she does desaturate I think this is more chronic due to obesity related hypoventilation syndrome, patient confirms that this has been a chronic issue that she gets short winded even with minimal exertion, she did qualify for 2 L nasal cannula oxygen.  Request PCP to monitor and adjust.  2.  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  Follow with PCP for weight loss, nighttime CPAP to continue.  3.  Dehydration and AKI.  Resolved now back to baseline.  4.  DM type II.  Continue home regimen.   Discharge diagnosis     Principal Problem:   SIRS (systemic inflammatory response syndrome) (HCC) Active Problems:   Chronic diastolic (congestive) heart failure (HCC)   Essential hypertension   OSA on CPAP   Psoriasis with arthropathy (HCC)   DM2 (diabetes mellitus, type 2) (HCC)   Chronic respiratory failure with hypoxia and hypercapnia [facility])    Discharge instructions    Discharge Instructions    Discharge instructions   Complete by: As directed    Follow with Primary MD Lajean Manes, MD in 7 days   Get CBC, CMP, 2 view Chest X ray -  checked next visit within 1 week by Primary MD    Activity: As tolerated with Full fall precautions use walker/cane & assistance as needed  Disposition Home   Diet: Heart Healthy Low Carb  Accuchecks 4 times/day, Once in AM empty stomach and then before each meal. Log in all results and show them to your Prim.MD in 3 days. If any glucose reading is under 80 or above 300 call your Prim MD  immidiately. Follow Low glucose instructions for glucose under 80 as instructed.   Special Instructions: If you have smoked or chewed Tobacco  in the last 2 yrs please stop smoking, stop any regular Alcohol  and or any Recreational drug use.  On your next visit with your primary care physician please Get Medicines reviewed and adjusted.  Please request your Prim.MD to go over all Hospital Tests and Procedure/Radiological results at the follow up, please get all Hospital records sent to your Prim MD by signing hospital release before you go home.  If you experience worsening of your admission symptoms, develop shortness of breath, life threatening emergency, suicidal or homicidal thoughts you must seek medical attention immediately by calling 911 or calling your MD immediately  if symptoms less severe.  You Must read complete instructions/literature along with all the possible adverse reactions/side effects for all the Medicines you take and that have been prescribed to you. Take any new Medicines after you have completely understood and accpet all the possible adverse reactions/side effects.   Increase activity slowly   Complete by: As directed       Discharge Medications   Allergies as of 05/04/2020      Reactions   Metaxalone Anaphylaxis   Nitroglycerin    BP bottoms out   Tramadol-acetaminophen Anaphylaxis   Icosapent Ethyl Other (See Comments)   Stomach pain, gas, diarrhea, headache from Vascepa   Brompheniramine-pseudoeph    Feels drunk   Meprobamate    Metformin Nausea Only   "flushes"   Methyldopa    Other reaction(s): Unknown   Metoprolol    Other reaction(s): Unknown   Procaine    Passes out   Pseudoephedrine Hcl    "feel drunk"      Medication List    TAKE these medications   acetaminophen 500 MG tablet Commonly known as: TYLENOL Take 500 mg by mouth every 8 (eight) hours as needed for moderate pain or headache.   acetaminophen 650 MG CR tablet Commonly  known as: TYLENOL Take 1,300 mg by mouth in the morning and at bedtime.   albuterol 108 (90 Base) MCG/ACT inhaler Commonly known as: VENTOLIN HFA Inhale 2 puffs  into the lungs 4 (four) times daily as needed for wheezing or shortness of breath.   amLODipine 5 MG tablet Commonly known as: NORVASC Take 5 mg by mouth daily.   aspirin EC 81 MG tablet Take 81 mg by mouth daily. Swallow whole.   azithromycin 250 MG tablet Commonly known as: ZITHROMAX Take 1 tablet (250 mg total) by mouth daily.   Breo Ellipta 200-25 MCG/INH Aepb Generic drug: fluticasone furoate-vilanterol Inhale 1 puff into the lungs daily.   carvedilol 25 MG tablet Commonly known as: COREG Take 25 mg by mouth 2 (two) times daily with a meal.   diclofenac Sodium 1 % Gel Commonly known as: VOLTAREN Apply 2-4 g topically 4 (four) times daily as needed (pain).   ferrous sulfate 325 (65 FE) MG tablet Take 325 mg by mouth every Wednesday.   fluticasone 50 MCG/ACT nasal spray Commonly known as: FLONASE Place 1 spray into both nostrils daily as needed for allergies or rhinitis.   glucose 4 GM chewable tablet Chew 3 tablets by mouth as needed for low blood sugar.   glucose blood test strip as directed. Dispense Blood Glucose Test Strips to patient for home use. Testing 4 times daily.   insulin NPH-regular Human (70-30) 100 UNIT/ML injection Inject 73 Units into the skin with breakfast, with lunch, and with evening meal.   INSULIN SYRINGE 1CC/31GX5/16" 31G X 5/16" 1 ML Misc USE TO INJECT INSULIN TWO TIMES A DAY   Ixekizumab 80 MG/ML Soaj Inject into the skin. Give subcutaneous every 4 weeks   Lancets 33G Misc as directed. Testing 4 times daily.  Dispense Lancets to patient for home use.   lisinopril 10 MG tablet Commonly known as: ZESTRIL Take 10 mg by mouth daily.   melatonin 5 MG Tabs 1 tablet in the evening   meloxicam 7.5 MG tablet Commonly known as: MOBIC Take 7.5 mg by mouth in the morning and  at bedtime.   MULTIVITAMIN ADULT PO Take 1 tablet by mouth daily.   polyethylene glycol 17 g packet Commonly known as: MIRALAX / GLYCOLAX Take 17 g by mouth daily as needed for mild constipation.   polyethylene glycol powder 17 GM/SCOOP powder Commonly known as: GLYCOLAX/MIRALAX Take 17 g by mouth daily as needed for mild constipation.   ranolazine 1000 MG SR tablet Commonly known as: RANEXA Take 500 mg by mouth 2 (two) times daily.   Respiratory Therapy Supplies [place] as directed. BIPAP 18/14 cm H2O with humidifier, mask, tubing, and headgear.   rosuvastatin 20 MG tablet Commonly known as: CRESTOR Take 20 mg by mouth at bedtime.   Spiriva HandiHaler 18 MCG inhalation capsule Generic drug: tiotropium Place 18 mcg into inhaler and inhale daily.   torsemide 100 MG tablet Commonly known as: DEMADEX Take 0.5 tablets (50 mg total) by mouth daily.   Turmeric 500 MG Caps Take 500 mg by mouth daily.   vitamin B-12 1000 MCG tablet Commonly known as: CYANOCOBALAMIN Take 1,000 mcg by mouth daily.   Vitamin D3 125 MCG (5000 UT) Caps Take 5,000 Units by mouth.            Durable Medical Equipment  (From admission, onward)         Start     Ordered   05/04/20 0812  For home use only DME oxygen  Once       Question Answer Comment  Length of Need 6 Months   Mode or (Route) Nasal cannula   Liters per Minute 2  Frequency Continuous (stationary and portable oxygen unit needed)   Oxygen conserving device Yes   Oxygen delivery system Gas      05/04/20 0812           Follow-up Information    Stoneking, Hal, MD. Schedule an appointment as soon as possible for a visit in 1 week(s).   Specialty: Internal Medicine Contact information: [address] [place] [address] [phone]               Major procedures and Radiology Reports - PLEASE review detailed and final reports thoroughly  -       DG Chest Port 1 View  Result Date:  05/03/2020 CLINICAL DATA:  Shortness of breath EXAM: PORTABLE CHEST 1 VIEW COMPARISON:  May 02, 2020 FINDINGS: The mediastinal contour is stable. The heart size is enlarged. Increased pulmonary interstitium is identified bilaterally. There is probable minimal left pleural effusion. Patchy consolidation of lateral left lung base is identified. The visualized skeletal structures are unremarkable. IMPRESSION: 1. Congestive heart failure worsened compared to prior exam. 2. Patchy consolidation of lateral left lung base, superimposed pneumonia is not excluded. Electronically Signed   By: Abelardo Diesel M.D.   On: 05/03/2020 08:42   DG Chest Port 1 View  Result Date: 05/02/2020 CLINICAL DATA:  Shortness of breath EXAM: PORTABLE CHEST 1 VIEW COMPARISON:  None. FINDINGS: Cardiomegaly, vascular congestion. No overt edema. No confluent opacities or effusions. No acute bony abnormality. IMPRESSION: Cardiomegaly, vascular congestion. Electronically Signed   By: Rolm Baptise M.D.   On: 05/02/2020 22:11    Micro Results     Recent Results (from the past 240 hour(s))  Resp Panel by RT-PCR (Flu A&B, Covid)     Status: None   Collection Time: 05/02/20 10:08 PM  Result Value Ref Range Status   SARS Coronavirus 2 by RT PCR NEGATIVE NEGATIVE Final    Comment: (NOTE) SARS-CoV-2 target nucleic acids are NOT DETECTED.  The SARS-CoV-2 RNA is generally detectable in upper respiratory specimens during the acute phase of infection. The lowest concentration of SARS-CoV-2 viral copies this assay can detect is 138 copies/mL. A negative result does not preclude SARS-Cov-2 infection and should not be used as the sole basis for treatment or other patient management decisions. A negative result may occur with  improper specimen collection/handling, submission of specimen other than nasopharyngeal swab, presence of viral mutation(s) within the areas targeted by this assay, and inadequate number of viral copies(<138  copies/mL). A negative result must be combined with clinical observations, patient history, and epidemiological information. The expected result is Negative.  Fact Sheet for Patients:  EntrepreneurPulse.com.au  Fact Sheet for Healthcare Providers:  IncredibleEmployment.be  This test is no t yet approved or cleared by the [place] FDA and  has been authorized for detection and/or diagnosis of SARS-CoV-2 by FDA under an Emergency Use Authorization (EUA). This EUA will remain  in effect (meaning this test can be used) for the duration of the COVID-19 declaration under Section 564(b)(1) of the Act, 21 U.S.C.section 360bbb-3(b)(1), unless the authorization is terminated  or revoked sooner.       Influenza A by PCR NEGATIVE NEGATIVE Final   Influenza B by PCR NEGATIVE NEGATIVE Final    Comment: (NOTE) The Xpert Xpress SARS-CoV-2/FLU/RSV plus assay is intended as an aid in the diagnosis of influenza from Nasopharyngeal swab specimens and should not be used as a sole basis for treatment. Nasal washings and aspirates are unacceptable for Xpert  Xpress SARS-CoV-2/FLU/RSV testing.  Fact Sheet for Patients: EntrepreneurPulse.com.au  Fact Sheet for Healthcare Providers: IncredibleEmployment.be  This test is not yet approved or cleared by the [place] FDA and has been authorized for detection and/or diagnosis of SARS-CoV-2 by FDA under an Emergency Use Authorization (EUA). This EUA will remain in effect (meaning this test can be used) for the duration of the COVID-19 declaration under Section 564(b)(1) of the Act, 21 U.S.C. section 360bbb-3(b)(1), unless the authorization is terminated or revoked.  Performed at [facility], [place] [address] [place], [place] [address]   Culture, blood (routine x 2)     Status: None (Preliminary result)   Collection Time: 05/02/20 11:40 PM   Specimen: BLOOD  Result  Value Ref Range Status   Specimen Description BLOOD SITE NOT SPECIFIED  Final   Special Requests   Final    BOTTLES DRAWN AEROBIC AND ANAEROBIC Blood Culture adequate volume   Culture   Final    NO GROWTH < 12 HOURS Performed at [facility], [place] [address], [place], [place] [address]    Report Status PENDING  Incomplete  Culture, blood (routine x 2)     Status: None (Preliminary result)   Collection Time: 05/02/20 11:40 PM   Specimen: BLOOD  Result Value Ref Range Status   Specimen Description BLOOD SITE NOT SPECIFIED  Final   Special Requests   Final    BOTTLES DRAWN AEROBIC AND ANAEROBIC Blood Culture adequate volume   Culture   Final    NO GROWTH < 12 HOURS Performed at [facility], [place] [address], [place], [place] [address]    Report Status PENDING  Incomplete  MRSA PCR Screening     Status: None   Collection Time: 05/03/20  4:24 AM   Specimen: Nasal Mucosa; Nasopharyngeal  Result Value Ref Range Status   MRSA by PCR NEGATIVE NEGATIVE Final    Comment:        The GeneXpert MRSA Assay (FDA approved for NASAL specimens only), is one component of a comprehensive MRSA colonization surveillance program. It is not intended to diagnose MRSA infection nor to guide or monitor treatment for MRSA infections. Performed at [facility], [place] [address], [place], [place] [address]     Today   Subjective    [name] today has no headache,no chest abdominal pain,no new weakness tingling or numbness, feels much better wants to go home today.     Objective   Blood pressure 132/60, pulse 85, temperature 98.7 F (37.1 C), temperature source Axillary, resp. rate 20, height 5\' 4"  (1.626 m), weight (!) 151.2 kg, SpO2 98 %.   Intake/Output Summary (Last 24 hours) at 05/04/2020 0912 Last data filed at 05/04/2020 0724 Gross per 24 hour  Intake 1539.44 ml  Output 1825 ml  Net -285.56 ml    Exam  Awake Alert, No new F.N deficits, Normal  affect [place].AT,PERRAL Supple Neck,No JVD, No cervical lymphadenopathy appriciated.  Symmetrical Chest wall movement, Good air movement bilaterally, CTAB RRR,No Gallops,Rubs or new Murmurs, No Parasternal Heave +ve B.Sounds, Abd Soft, Non tender, No organomegaly appriciated, No rebound -guarding or rigidity. No Cyanosis, Clubbing or edema, No new Rash or bruise   Data Review   CBC w Diff:  Lab Results  Component Value Date   WBC 5.5 05/04/2020   HGB 11.6 (L) 05/04/2020   HGB 11.1 08/16/2019   HCT 36.8 05/04/2020   HCT 33.6 (L) 08/16/2019   PLT 161  05/04/2020   PLT 180 08/16/2019   LYMPHOPCT 11 05/04/2020   MONOPCT 5 05/04/2020   EOSPCT 0 05/04/2020   BASOPCT 0 05/04/2020    CMP:  Lab Results  Component Value Date   NA 136 05/04/2020   NA 139 12/31/2019   K 3.9 05/04/2020   CL 98 05/04/2020   CO2 29 05/04/2020   BUN 22 05/04/2020   BUN 20 12/31/2019   CREATININE 1.11 (H) 05/04/2020   PROT 6.5 05/04/2020   PROT 5.9 (L) 08/16/2019   ALBUMIN 3.3 (L) 05/04/2020   ALBUMIN 4.0 08/16/2019   BILITOT 0.6 05/04/2020   BILITOT 0.3 08/16/2019   ALKPHOS 40 05/04/2020   AST 68 (H) 05/04/2020   ALT 40 05/04/2020  .   Total Time in preparing paper work, data evaluation and todays exam - 63 minutes  Lala Lund M.D on 05/04/2020 at 9:12 AM  [facility]

## 2020-05-04 NOTE — Progress Notes (Signed)
SATURATION QUALIFICATIONS: (This note is used to comply with regulatory documentation for home oxygen)  Patient Saturations on Room Air at Rest = 90%  Patient Saturations on Room Air while Ambulating = 82%  Patient Saturations on 2 Liters of oxygen while Ambulating = 93%  Please briefly explain why patient needs home oxygen:

## 2020-05-04 NOTE — Progress Notes (Signed)
Received call from [place] with [employer]. She reports that they use [facility] for [place] and PT. Contacted Carolyn with [facility] and she accepted the referral.

## 2020-05-04 NOTE — Progress Notes (Signed)
Pt has been D/C today. Received referral to assist with HH PT, RN, and DME (Home O2). Met with pt. Pt reports that she resides at [place]. Discussed referral and preference for a [facility] agency. She reports that [facility] provides the therapy and nursing. Contacted Jermaine with [employer] for home O2 referral and he accepted the referral. Contacted [facility] at [phone], spoke with Geoffery Lyons, she provided [facility], home care management # [phone]. Contacted [facility] and left a VM regarding [facility] referral.   Awaiting for [facility] call. Contacted Claiborne Billings (admission coordinator for SNF). Left a VM regarding [facility] referral.

## 2020-05-04 NOTE — Progress Notes (Signed)
Patient placed on home cpap with 6L oxygen.

## 2020-05-04 NOTE — Discharge Instructions (Signed)
Follow with Primary MD Lajean Manes, MD in 7 days   Get CBC, CMP, 2 view Chest X ray -  checked next visit within 1 week by Primary MD    Activity: As tolerated with Full fall precautions use walker/cane & assistance as needed  Disposition Home   Diet: Heart Healthy Low Carb  Accuchecks 4 times/day, Once in AM empty stomach and then before each meal. Log in all results and show them to your Prim.MD in 3 days. If any glucose reading is under 80 or above 300 call your Prim MD immidiately. Follow Low glucose instructions for glucose under 80 as instructed.   Special Instructions: If you have smoked or chewed Tobacco  in the last 2 yrs please stop smoking, stop any regular Alcohol  and or any Recreational drug use.  On your next visit with your primary care physician please Get Medicines reviewed and adjusted.  Please request your Prim.MD to go over all Hospital Tests and Procedure/Radiological results at the follow up, please get all Hospital records sent to your Prim MD by signing hospital release before you go home.  If you experience worsening of your admission symptoms, develop shortness of breath, life threatening emergency, suicidal or homicidal thoughts you must seek medical attention immediately by calling 911 or calling your MD immediately  if symptoms less severe.  You Must read complete instructions/literature along with all the possible adverse reactions/side effects for all the Medicines you take and that have been prescribed to you. Take any new Medicines after you have completely understood and accpet all the possible adverse reactions/side effects.

## 2020-05-05 LAB — HEMOGLOBIN A1C
Hgb A1c MFr Bld: 7.7 % — ABNORMAL HIGH (ref 4.8–5.6)
Mean Plasma Glucose: 174 mg/dL

## 2020-05-08 LAB — CULTURE, BLOOD (ROUTINE X 2)
Culture: NO GROWTH
Culture: NO GROWTH
Special Requests: ADEQUATE
Special Requests: ADEQUATE

## 2020-06-03 ENCOUNTER — Encounter: Payer: Self-pay | Admitting: Podiatry

## 2020-06-03 ENCOUNTER — Ambulatory Visit (INDEPENDENT_AMBULATORY_CARE_PROVIDER_SITE_OTHER): Payer: Medicare Other | Admitting: Podiatry

## 2020-06-03 ENCOUNTER — Other Ambulatory Visit: Payer: Self-pay

## 2020-06-03 DIAGNOSIS — B351 Tinea unguium: Secondary | ICD-10-CM

## 2020-06-03 DIAGNOSIS — M79674 Pain in right toe(s): Secondary | ICD-10-CM | POA: Diagnosis not present

## 2020-06-03 DIAGNOSIS — M79675 Pain in left toe(s): Secondary | ICD-10-CM

## 2020-06-03 DIAGNOSIS — E1142 Type 2 diabetes mellitus with diabetic polyneuropathy: Secondary | ICD-10-CM

## 2020-06-03 NOTE — Progress Notes (Signed)
  Subjective:  Patient ID: [name], female    DOB: 07-Jun-1945,  MRN: [id number]  Chief Complaint  Patient presents with  . Nail Problem    Thick painful toenails, 3 month follow up  . Diabetes    A1C  7.7    75 y.o. female returns for follow-up with the above complaint. History confirmed with patient.  She was hospitalized recently she is still on oxygen  Objective:  Physical Exam: warm, good capillary refill, no trophic changes or ulcerative lesions and normal DP and PT pulses.  Loss of protective sensation noted in toes bilaterally with abnormal monofilament test.  Onychomycosis is noted with thickened elongated nail plates, yellow discoloration ingrowing incurvated borders Assessment:   1. Pain due to onychomycosis of toenails of both feet   2. Type 2 diabetes mellitus with diabetic polyneuropathy, unspecified whether long term insulin use ([place])      Plan:  Patient was evaluated and treated and all questions answered.  Patient educated on diabetes. Discussed proper diabetic foot care and discussed risks and complications of disease. Educated patient in depth on reasons to return to the office immediately should he/she discover anything concerning or new on the feet. All questions answered. Discussed proper shoes as well.    Discussed the etiology and treatment options for the condition in detail with the patient. Educated patient on the topical and oral treatment options for mycotic nails. Recommended debridement of the nails today. Sharp and mechanical debridement performed of all painful and mycotic nails today. Nails debrided in length and thickness using a nail nipper and a mechanical burr to level of comfort. Discussed treatment options including appropriate shoe gear. Follow up as needed for painful nails.    Return in about 3 months (around 09/03/2020) for at risk diabetic foot care.

## 2020-06-10 ENCOUNTER — Ambulatory Visit: Payer: Medicare Other | Admitting: Podiatry

## 2020-09-02 ENCOUNTER — Ambulatory Visit (INDEPENDENT_AMBULATORY_CARE_PROVIDER_SITE_OTHER): Payer: Medicare Other | Admitting: Podiatry

## 2020-09-02 ENCOUNTER — Other Ambulatory Visit: Payer: Self-pay

## 2020-09-02 DIAGNOSIS — B351 Tinea unguium: Secondary | ICD-10-CM | POA: Diagnosis not present

## 2020-09-02 DIAGNOSIS — E1142 Type 2 diabetes mellitus with diabetic polyneuropathy: Secondary | ICD-10-CM

## 2020-09-02 DIAGNOSIS — M79675 Pain in left toe(s): Secondary | ICD-10-CM

## 2020-09-02 DIAGNOSIS — M79674 Pain in right toe(s): Secondary | ICD-10-CM | POA: Diagnosis not present

## 2020-09-02 NOTE — Progress Notes (Signed)
  Subjective:  Patient ID: [name], female    DOB: 03-23-1945,  MRN: [id number]  Chief Complaint  Patient presents with   Nail Problem    Thick painful toenails, 3 month follow up   Diabetes    A1C 7.7    75 y.o. female returns for follow-up with the above complaint. History confirmed with patient.  She is still on oxygen but slowly improving  Objective:  Physical Exam: warm, good capillary refill, no trophic changes or ulcerative lesions and normal DP and PT pulses.  Loss of protective sensation noted in toes bilaterally with abnormal monofilament test.  Onychomycosis is noted with thickened elongated nail plates, yellow discoloration ingrowing incurvated borders Assessment:   1. Pain due to onychomycosis of toenails of both feet   2. Type 2 diabetes mellitus with diabetic polyneuropathy, unspecified whether long term insulin use ([place])       Plan:  Patient was evaluated and treated and all questions answered.  Patient educated on diabetes. Discussed proper diabetic foot care and discussed risks and complications of disease. Educated patient in depth on reasons to return to the office immediately should he/she discover anything concerning or new on the feet. All questions answered. Discussed proper shoes as well.    Discussed the etiology and treatment options for the condition in detail with the patient. Educated patient on the topical and oral treatment options for mycotic nails. Recommended debridement of the nails today. Sharp and mechanical debridement performed of all painful and mycotic nails today. Nails debrided in length and thickness using a nail nipper and a mechanical burr to level of comfort. Discussed treatment options including appropriate shoe gear. Follow up as needed for painful nails.    Return in about 3 months (around 12/03/2020) for at risk diabetic foot care.

## 2020-11-27 ENCOUNTER — Other Ambulatory Visit: Payer: Self-pay | Admitting: Cardiovascular Disease

## 2020-12-02 NOTE — Telephone Encounter (Signed)
*  STAT* If patient is at the pharmacy, call can be transferred to refill team.   1. Which medications need to be refilled? (please list name of each medication and dose if known) torsemide (DEMADEX) 100 MG tablet  2. Which pharmacy/location (including street and city if local pharmacy) is medication to be sent to? [place], [place]  3. Do they need a 30 day or 90 day supply? 90 day

## 2020-12-09 ENCOUNTER — Ambulatory Visit (INDEPENDENT_AMBULATORY_CARE_PROVIDER_SITE_OTHER): Payer: Medicare Other | Admitting: Podiatry

## 2020-12-09 ENCOUNTER — Other Ambulatory Visit: Payer: Self-pay

## 2020-12-09 DIAGNOSIS — E1142 Type 2 diabetes mellitus with diabetic polyneuropathy: Secondary | ICD-10-CM

## 2020-12-09 DIAGNOSIS — M79675 Pain in left toe(s): Secondary | ICD-10-CM

## 2020-12-09 DIAGNOSIS — M79674 Pain in right toe(s): Secondary | ICD-10-CM | POA: Diagnosis not present

## 2020-12-09 DIAGNOSIS — B351 Tinea unguium: Secondary | ICD-10-CM | POA: Diagnosis not present

## 2020-12-09 NOTE — Progress Notes (Signed)
  Subjective:  Patient ID: [name], female    DOB: 10/26/1945,  MRN: [id number]  Chief Complaint  Patient presents with   Nail Problem    Thick painful toenails, 3 month follow up    75 y.o. female returns for follow-up with the above complaint. History confirmed with patient.  Nails are becoming quite painful again they become painful before her next visit.  She has a new wound on the left heel that the wound care nurse is managing at her facility  Objective:  Physical Exam: warm, good capillary refill, no trophic changes or ulcerative lesions and normal DP and PT pulses.  Loss of protective sensation noted in toes bilaterally with abnormal monofilament test.  Onychomycosis is noted with thickened elongated nail plates, yellow discoloration ingrowing incurvated borders Assessment:   1. Pain due to onychomycosis of toenails of both feet   2. Type 2 diabetes mellitus with diabetic polyneuropathy, unspecified whether long term insulin use ([place])       Plan:  Patient was evaluated and treated and all questions answered.  Patient educated on diabetes. Discussed proper diabetic foot care and discussed risks and complications of disease. Educated patient in depth on reasons to return to the office immediately should he/she discover anything concerning or new on the feet. All questions answered. Discussed proper shoes as well.    Discussed the etiology and treatment options for the condition in detail with the patient. Educated patient on the topical and oral treatment options for mycotic nails. Recommended debridement of the nails today. Sharp and mechanical debridement performed of all painful and mycotic nails today. Nails debrided in length and thickness using a nail nipper and a mechanical burr to level of comfort. Discussed treatment options including appropriate shoe gear. Follow up as needed for painful nails.  Wound care nurse is managing the wound on her left foot currently.  If  this worsens will reevaluate personally  Return in about 10 weeks (around 02/17/2021) for at risk diabetic foot care.

## 2021-01-31 ENCOUNTER — Emergency Department (HOSPITAL_COMMUNITY): Payer: Medicare Other

## 2021-01-31 ENCOUNTER — Inpatient Hospital Stay (HOSPITAL_COMMUNITY)
Admission: EM | Admit: 2021-01-31 | Discharge: 2021-02-05 | DRG: 291 | Disposition: A | Discharge status: Skilled Nursing Facility | Payer: Medicare Other | Attending: Internal Medicine | Admitting: Internal Medicine

## 2021-01-31 ENCOUNTER — Other Ambulatory Visit: Payer: Self-pay

## 2021-01-31 ENCOUNTER — Encounter (HOSPITAL_COMMUNITY): Payer: Self-pay | Admitting: Emergency Medicine

## 2021-01-31 DIAGNOSIS — T502X5A Adverse effect of carbonic-anhydrase inhibitors, benzothiadiazides and other diuretics, initial encounter: Secondary | ICD-10-CM | POA: Diagnosis not present

## 2021-01-31 DIAGNOSIS — R52 Pain, unspecified: Secondary | ICD-10-CM

## 2021-01-31 DIAGNOSIS — G8929 Other chronic pain: Secondary | ICD-10-CM | POA: Diagnosis present

## 2021-01-31 DIAGNOSIS — M25552 Pain in left hip: Secondary | ICD-10-CM | POA: Diagnosis present

## 2021-01-31 DIAGNOSIS — R21 Rash and other nonspecific skin eruption: Secondary | ICD-10-CM | POA: Diagnosis present

## 2021-01-31 DIAGNOSIS — M1712 Unilateral primary osteoarthritis, left knee: Secondary | ICD-10-CM | POA: Diagnosis present

## 2021-01-31 DIAGNOSIS — E896 Postprocedural adrenocortical (-medullary) hypofunction: Secondary | ICD-10-CM | POA: Diagnosis present

## 2021-01-31 DIAGNOSIS — I5032 Chronic diastolic (congestive) heart failure: Secondary | ICD-10-CM | POA: Diagnosis present

## 2021-01-31 DIAGNOSIS — G4733 Obstructive sleep apnea (adult) (pediatric): Secondary | ICD-10-CM

## 2021-01-31 DIAGNOSIS — J9611 Chronic respiratory failure with hypoxia: Secondary | ICD-10-CM | POA: Diagnosis not present

## 2021-01-31 DIAGNOSIS — S81802A Unspecified open wound, left lower leg, initial encounter: Secondary | ICD-10-CM | POA: Diagnosis present

## 2021-01-31 DIAGNOSIS — Z823 Family history of stroke: Secondary | ICD-10-CM

## 2021-01-31 DIAGNOSIS — I252 Old myocardial infarction: Secondary | ICD-10-CM

## 2021-01-31 DIAGNOSIS — M069 Rheumatoid arthritis, unspecified: Secondary | ICD-10-CM | POA: Diagnosis present

## 2021-01-31 DIAGNOSIS — I2510 Atherosclerotic heart disease of native coronary artery without angina pectoris: Secondary | ICD-10-CM | POA: Diagnosis not present

## 2021-01-31 DIAGNOSIS — E1159 Type 2 diabetes mellitus with other circulatory complications: Secondary | ICD-10-CM

## 2021-01-31 DIAGNOSIS — E119 Type 2 diabetes mellitus without complications: Secondary | ICD-10-CM

## 2021-01-31 DIAGNOSIS — Z7982 Long term (current) use of aspirin: Secondary | ICD-10-CM

## 2021-01-31 DIAGNOSIS — R079 Chest pain, unspecified: Secondary | ICD-10-CM | POA: Diagnosis present

## 2021-01-31 DIAGNOSIS — L409 Psoriasis, unspecified: Secondary | ICD-10-CM | POA: Diagnosis present

## 2021-01-31 DIAGNOSIS — Z87891 Personal history of nicotine dependence: Secondary | ICD-10-CM

## 2021-01-31 DIAGNOSIS — Z9861 Coronary angioplasty status: Secondary | ICD-10-CM

## 2021-01-31 DIAGNOSIS — Z9989 Dependence on other enabling machines and devices: Secondary | ICD-10-CM

## 2021-01-31 DIAGNOSIS — U099 Post covid-19 condition, unspecified: Secondary | ICD-10-CM | POA: Diagnosis present

## 2021-01-31 DIAGNOSIS — Z888 Allergy status to other drugs, medicaments and biological substances status: Secondary | ICD-10-CM

## 2021-01-31 DIAGNOSIS — Z7951 Long term (current) use of inhaled steroids: Secondary | ICD-10-CM

## 2021-01-31 DIAGNOSIS — R32 Unspecified urinary incontinence: Secondary | ICD-10-CM | POA: Diagnosis present

## 2021-01-31 DIAGNOSIS — I493 Ventricular premature depolarization: Secondary | ICD-10-CM | POA: Diagnosis present

## 2021-01-31 DIAGNOSIS — I5033 Acute on chronic diastolic (congestive) heart failure: Secondary | ICD-10-CM | POA: Diagnosis present

## 2021-01-31 DIAGNOSIS — Z8249 Family history of ischemic heart disease and other diseases of the circulatory system: Secondary | ICD-10-CM

## 2021-01-31 DIAGNOSIS — E785 Hyperlipidemia, unspecified: Secondary | ICD-10-CM | POA: Diagnosis present

## 2021-01-31 DIAGNOSIS — N179 Acute kidney failure, unspecified: Secondary | ICD-10-CM | POA: Diagnosis not present

## 2021-01-31 DIAGNOSIS — I110 Hypertensive heart disease with heart failure: Principal | ICD-10-CM | POA: Diagnosis present

## 2021-01-31 DIAGNOSIS — J9612 Chronic respiratory failure with hypercapnia: Secondary | ICD-10-CM | POA: Diagnosis present

## 2021-01-31 DIAGNOSIS — I10 Essential (primary) hypertension: Secondary | ICD-10-CM | POA: Diagnosis present

## 2021-01-31 DIAGNOSIS — I2582 Chronic total occlusion of coronary artery: Secondary | ICD-10-CM | POA: Diagnosis present

## 2021-01-31 DIAGNOSIS — K5900 Constipation, unspecified: Secondary | ICD-10-CM | POA: Diagnosis present

## 2021-01-31 DIAGNOSIS — E1165 Type 2 diabetes mellitus with hyperglycemia: Secondary | ICD-10-CM | POA: Diagnosis present

## 2021-01-31 DIAGNOSIS — Z6841 Body Mass Index (BMI) 40.0 and over, adult: Secondary | ICD-10-CM

## 2021-01-31 DIAGNOSIS — M5431 Sciatica, right side: Secondary | ICD-10-CM | POA: Diagnosis present

## 2021-01-31 DIAGNOSIS — R0602 Shortness of breath: Secondary | ICD-10-CM

## 2021-01-31 DIAGNOSIS — M62838 Other muscle spasm: Secondary | ICD-10-CM | POA: Diagnosis present

## 2021-01-31 DIAGNOSIS — D649 Anemia, unspecified: Secondary | ICD-10-CM | POA: Diagnosis present

## 2021-01-31 DIAGNOSIS — E871 Hypo-osmolality and hyponatremia: Secondary | ICD-10-CM | POA: Diagnosis not present

## 2021-01-31 DIAGNOSIS — Z20822 Contact with and (suspected) exposure to covid-19: Secondary | ICD-10-CM | POA: Diagnosis present

## 2021-01-31 DIAGNOSIS — Z79899 Other long term (current) drug therapy: Secondary | ICD-10-CM

## 2021-01-31 DIAGNOSIS — S81802D Unspecified open wound, left lower leg, subsequent encounter: Secondary | ICD-10-CM

## 2021-01-31 DIAGNOSIS — Z794 Long term (current) use of insulin: Secondary | ICD-10-CM

## 2021-01-31 DIAGNOSIS — Z9981 Dependence on supplemental oxygen: Secondary | ICD-10-CM

## 2021-01-31 LAB — CBC
HCT: 34.4 % — ABNORMAL LOW (ref 36.0–46.0)
Hemoglobin: 11 g/dL — ABNORMAL LOW (ref 12.0–15.0)
MCH: 30 pg (ref 26.0–34.0)
MCHC: 32 g/dL (ref 30.0–36.0)
MCV: 93.7 fL (ref 80.0–100.0)
Platelets: 203 10*3/uL (ref 150–400)
RBC: 3.67 MIL/uL — ABNORMAL LOW (ref 3.87–5.11)
RDW: 16.6 % — ABNORMAL HIGH (ref 11.5–15.5)
WBC: 7.8 10*3/uL (ref 4.0–10.5)
nRBC: 0 % (ref 0.0–0.2)

## 2021-01-31 LAB — BASIC METABOLIC PANEL
Anion gap: 11 (ref 5–15)
BUN: 23 mg/dL (ref 8–23)
CO2: 29 mmol/L (ref 22–32)
Calcium: 9.5 mg/dL (ref 8.9–10.3)
Chloride: 98 mmol/L (ref 98–111)
Creatinine, Ser: 0.96 mg/dL (ref 0.44–1.00)
GFR, Estimated: >60 mL/min (ref >60)
Glucose, Bld: 115 mg/dL — ABNORMAL HIGH (ref 70–99)
Potassium: 4.1 mmol/L (ref 3.5–5.1)
Sodium: 138 mmol/L (ref 135–145)

## 2021-01-31 LAB — CBG MONITORING, ED: Glucose-Capillary: 101 mg/dL — ABNORMAL HIGH (ref 70–99)

## 2021-01-31 LAB — RESP PANEL BY RT-PCR (FLU A&B, COVID) ARPGX2
Influenza A by PCR: NEGATIVE
Influenza B by PCR: NEGATIVE
SARS Coronavirus 2 by RT PCR: NEGATIVE

## 2021-01-31 LAB — BRAIN NATRIURETIC PEPTIDE: B Natriuretic Peptide: 113.8 pg/mL — ABNORMAL HIGH (ref 0.0–100.0)

## 2021-01-31 LAB — TROPONIN I (HIGH SENSITIVITY)
Troponin I (High Sensitivity): 24 ng/L — ABNORMAL HIGH (ref <18)
Troponin I (High Sensitivity): 47 ng/L — ABNORMAL HIGH (ref <18)

## 2021-01-31 MED ORDER — ASPIRIN 81 MG PO CHEW
324.0000 mg | CHEWABLE_TABLET | Freq: Once | ORAL | Status: AC
  Administered 2021-01-31: 324 mg via ORAL
  Filled 2021-01-31: qty 4

## 2021-01-31 MED ORDER — FENTANYL CITRATE PF 50 MCG/ML IJ SOSY
50.0000 ug | PREFILLED_SYRINGE | Freq: Once | INTRAMUSCULAR | Status: AC
  Administered 2021-01-31: 50 ug via INTRAVENOUS
  Filled 2021-01-31: qty 1

## 2021-01-31 MED ORDER — FENTANYL CITRATE PF 50 MCG/ML IJ SOSY
100.0000 ug | PREFILLED_SYRINGE | Freq: Once | INTRAMUSCULAR | Status: AC
  Administered 2021-01-31: 100 ug via INTRAVENOUS
  Filled 2021-01-31: qty 2

## 2021-01-31 MED ORDER — METHOCARBAMOL 500 MG PO TABS
500.0000 mg | ORAL_TABLET | Freq: Once | ORAL | Status: AC
  Administered 2021-01-31: 500 mg via ORAL
  Filled 2021-01-31: qty 1

## 2021-01-31 NOTE — ED Notes (Signed)
Pt assisted back into her bed by staff from the transport wheelchair. Purewick placed on pt due to c/o need for urination.

## 2021-01-31 NOTE — ED Provider Notes (Signed)
[facility] [place] EMERGENCY DEPARTMENT Provider Note   CSN: [id number] Arrival date & time: 01/31/21  1723     History  Chief Complaint  Patient presents with   Chest Pain    [name] is a 76 y.o. female with history of CAD, diabetes, HLD, HTN, OSA who presents to the emergency department with numerous complaints.  She is complaining of intermittent chest pain for the last several days.  Patient states today that her pain was worse, she described it as dull in the center of her chest.  Did not radiate anywhere else.  She also reports a history of CHF and "long COVID", and states that her shortness of breath today was worse.  She is chronically on 3 L of oxygen at home.  She is also complaining of left hip pain starting today, without injury.  She believes it was from sitting in an uncomfortable chair for too long.  She does have a history of rheumatoid arthritis, and was recently taken off methotrexate.  She is also complaining of a rash/wound on her left lower leg going on for several weeks.  She has been seen at her primary doctor who recommended she obtain MRI, but due to insurance she has not been able to get this.   Chest Pain Associated symptoms: shortness of breath   Associated symptoms: no abdominal pain, no cough, no fever, no nausea and no vomiting      Past Medical History:  Diagnosis Date   Coronary artery disease    Diabetes mellitus without complication (HCC)    Hyperlipidemia    Hypertension    OSA (obstructive sleep apnea)      Home Medications Prior to Admission medications   Medication Sig Start Date End Date Taking? Authorizing Provider  acetaminophen (TYLENOL) 500 MG tablet Take 500 mg by mouth every 8 (eight) hours as needed for moderate pain or headache.    [provider]  acetaminophen (TYLENOL) 650 MG CR tablet Take 1,300 mg by mouth in the morning and at bedtime.    [provider]  albuterol (VENTOLIN HFA) 108 (90  Base) MCG/ACT inhaler Inhale 2 puffs into the lungs 4 (four) times daily as needed for wheezing or shortness of breath.    [provider]  amLODipine (NORVASC) 5 MG tablet Take 5 mg by mouth daily.    [provider]  aspirin EC 81 MG tablet Take 81 mg by mouth daily. Swallow whole.    [provider]  azithromycin (ZITHROMAX) 250 MG tablet Take 1 tablet (250 mg total) by mouth daily. 05/04/20   Thurnell Lose, MD  BREO ELLIPTA 200-25 MCG/INH AEPB Inhale 1 puff into the lungs daily. 04/22/20   [provider]  carvedilol (COREG) 25 MG tablet Take 25 mg by mouth 2 (two) times daily with a meal.    [provider]  Cholecalciferol (VITAMIN D3) 125 MCG (5000 UT) CAPS Take 5,000 Units by mouth.    [provider]  diclofenac Sodium (VOLTAREN) 1 % GEL Apply 2-4 g topically 4 (four) times daily as needed (pain).    [provider]  ferrous sulfate 325 (65 FE) MG tablet Take 325 mg by mouth every Wednesday.    [provider]  fluticasone (FLONASE) 50 MCG/ACT nasal spray Place 1 spray into both nostrils daily as needed for allergies or rhinitis.    [provider]  glucose 4 GM chewable tablet Chew 3 tablets by mouth as needed for low  blood sugar.    [provider]  glucose blood test strip as directed. Dispense Blood Glucose Test Strips to patient for home use. Testing 4 times daily. 01/16/19   [provider]  insulin NPH-regular Human (70-30) 100 UNIT/ML injection Inject 73 Units into the skin with breakfast, with lunch, and with evening meal.    [provider]  Insulin Syringe-Needle U-100 (INSULIN SYRINGE 1CC/31GX5/16") 31G X 5/16" 1 ML MISC USE TO INJECT INSULIN TWO TIMES A DAY 03/28/19   [provider]  Ixekizumab 80 MG/ML SOAJ Inject into the skin. Give subcutaneous every 4 weeks    [provider]  Lancets 33G MISC as directed. Testing 4 times daily.  Dispense Lancets to  patient for home use. 01/16/19   [provider]  lisinopril (ZESTRIL) 10 MG tablet Take 10 mg by mouth daily.    [provider]  melatonin 5 MG TABS 1 tablet in the evening 05/01/20   [provider]  meloxicam (MOBIC) 7.5 MG tablet Take 7.5 mg by mouth in the morning and at bedtime.    [provider]  Multiple Vitamin (MULTIVITAMIN ADULT PO) Take 1 tablet by mouth daily.    [provider]  polyethylene glycol (MIRALAX / GLYCOLAX) 17 g packet Take 17 g by mouth daily as needed for mild constipation.    [provider]  polyethylene glycol powder (GLYCOLAX/MIRALAX) 17 GM/SCOOP powder Take 17 g by mouth daily as needed for mild constipation.    [provider]  ranolazine (RANEXA) 1000 MG SR tablet Take 500 mg by mouth 2 (two) times daily.    [provider]  Respiratory Therapy Supplies [place] as directed. BIPAP 18/14 cm H2O with humidifier, mask, tubing, and headgear. 03/14/19   [provider]  rosuvastatin (CRESTOR) 20 MG tablet Take 20 mg by mouth at bedtime.    [provider]  tiotropium (SPIRIVA HANDIHALER) 18 MCG inhalation capsule Place 18 mcg into inhaler and inhale daily.    [provider]  torsemide (DEMADEX) 100 MG tablet TAKE 1/2 TABLET = 50 MG BY MOUTH ONCE DAILY 12/02/20   O'Neal, Cassie Freer, MD  Turmeric 500 MG CAPS Take 500 mg by mouth daily.    [provider]  vitamin B-12 (CYANOCOBALAMIN) 1000 MCG tablet Take 1,000 mcg by mouth daily.    [provider]      Allergies    Metaxalone, Nitroglycerin, Tramadol-acetaminophen, Icosapent ethyl, Brompheniramine-pseudoeph, Meprobamate, Metformin, Methyldopa, Metoprolol, Procaine, and Pseudoephedrine hcl    Review of Systems   Review of Systems  Constitutional:  Negative for chills and fever.  HENT:  Negative for congestion.   Respiratory:  Positive for shortness of breath. Negative for cough.   Cardiovascular:   Positive for chest pain.  Gastrointestinal:  Negative for abdominal pain, constipation, diarrhea, nausea and vomiting.  Genitourinary:  Negative for dysuria and flank pain.  Musculoskeletal:  Positive for arthralgias.  Skin:  Positive for wound.  All other systems reviewed and are negative.  Physical Exam Updated Vital Signs BP 134/74    Pulse 88    Temp 98.5 F (36.9 C)    Resp 20    Ht 5\' 4"  (1.626 m)    Wt (!) 149.7 kg    SpO2 100%    BMI 56.64 kg/m  Physical Exam Vitals and nursing note reviewed.  Constitutional:      Appearance: Normal appearance.  HENT:     Head: Normocephalic and atraumatic.  Eyes:  Conjunctiva/sclera: Conjunctivae normal.  Cardiovascular:     Rate and Rhythm: Normal rate and regular rhythm.     Pulses:          Dorsalis pedis pulses are 1+ on the right side and 1+ on the left side.  Pulmonary:     Effort: Pulmonary effort is normal. No respiratory distress.     Breath sounds: Normal breath sounds.  Abdominal:     General: There is no distension.     Palpations: Abdomen is soft.     Tenderness: There is no abdominal tenderness.  Skin:    General: Skin is warm and dry.     Comments: Diffuse erythema to the posterior left distal calf, no significant edema.  There are multiple breaks in the skin that are erythematous, no purulent drainage.  Neurological:     General: No focal deficit present.     Mental Status: She is alert.    ED Results / Procedures / Treatments   Labs (all labs ordered are listed, but only abnormal results are displayed) Labs Reviewed  BASIC METABOLIC PANEL - Abnormal; Notable for the following components:      Result Value   Glucose, Bld 115 (*)    All other components within normal limits  CBC - Abnormal; Notable for the following components:   RBC 3.67 (*)    Hemoglobin 11.0 (*)    HCT 34.4 (*)    RDW 16.6 (*)    All other components within normal limits  CBG MONITORING, ED - Abnormal; Notable for the following  components:   Glucose-Capillary 101 (*)    All other components within normal limits  TROPONIN I (HIGH SENSITIVITY) - Abnormal; Notable for the following components:   Troponin I (High Sensitivity) 24 (*)    All other components within normal limits  TROPONIN I (HIGH SENSITIVITY) - Abnormal; Notable for the following components:   Troponin I (High Sensitivity) 47 (*)    All other components within normal limits  RESP PANEL BY RT-PCR (FLU A&B, COVID) ARPGX2  BRAIN NATRIURETIC PEPTIDE    EKG EKG Interpretation  Date/Time:  Saturday January 31 2021 17:45:10 EST Ventricular Rate:  94 PR Interval:  69 QRS Duration: 124 QT Interval:  354 QTC Calculation: 443 R Axis:   -2 Text Interpretation: Sinus rhythm Ventricular premature complex Short PR interval Nonspecific intraventricular conduction delay Minimal ST depression, lateral leads Premature atrial complexes similar to prior Confirmed by Wynona Dove ([id number]) on 01/31/2021 8:43:26 PM  Radiology DG Chest 2 View  Result Date: 01/31/2021 CLINICAL DATA:  Chest pain EXAM: CHEST - 2 VIEW COMPARISON:  05/03/2020 FINDINGS: Stable cardiomegaly. Aortic atherosclerosis. Streaky bibasilar opacities. No overt edema. No pleural effusion or pneumothorax. IMPRESSION: Streaky bibasilar opacities, favor atelectasis. Electronically Signed   By: Davina Poke D.O.   On: 01/31/2021 19:17   DG Pelvis 1-2 Views  Result Date: 01/31/2021 CLINICAL DATA:  Pelvic pain particularly on the left, initial encounter EXAM: PELVIS - 1 VIEW COMPARISON:  None. FINDINGS: Pelvic ring is intact. Degenerative changes of the hip joints are noted bilaterally. No acute fracture or dislocation is seen. No soft tissue abnormality is noted. IMPRESSION: Degenerative changes without acute abnormality. Electronically Signed   By: Inez Catalina M.D.   On: 01/31/2021 19:13   DG Femur Min 2 Views Left  Result Date: 01/31/2021 CLINICAL DATA:  Left hip pain, no known injury, initial  encounter EXAM: LEFT FEMUR 2 VIEWS COMPARISON:  None. FINDINGS: Degenerative changes of left hip  joint are noted. No acute fracture or dislocation is noted. Degenerative changes in the knee joint are seen as well. No soft tissue abnormality is noted. IMPRESSION: Nerve changes without acute abnormality. Electronically Signed   By: Inez Catalina M.D.   On: 01/31/2021 19:13    Procedures Procedures    Medications Ordered in ED Medications  aspirin chewable tablet 324 mg (has no administration in time range)  fentaNYL (SUBLIMAZE) injection 50 mcg (50 mcg Intravenous Given 01/31/21 1925)  fentaNYL (SUBLIMAZE) injection 50 mcg (50 mcg Intravenous Given 01/31/21 2052)  methocarbamol (ROBAXIN) tablet 500 mg (500 mg Oral Given 01/31/21 2052)  fentaNYL (SUBLIMAZE) injection 100 mcg (100 mcg Intravenous Given 01/31/21 2259)    ED Course/ Medical Decision Making/ A&P                           Medical Decision Making This patient presents to the ED for concern of chest pain, this involves an extensive number of treatment options, and is a complaint that carries with it a high risk of complications and morbidity. The emergent differential diagnosis includes, but is not limited to,  Acute coronary syndrome, tamponade, pericarditis/myocarditis, aortic dissection, pulmonary embolism, tension pneumothorax, pneumonia, and esophageal rupture.  I do not believe the patient has an emergent cause of chest pain, other urgent/non-acute considerations include, but are not limited to: chronic angina, aortic stenosis, cardiomyopathy, mitral valve prolapse, pulmonary hypertension, aortic insufficiency, right ventricular hypertrophy, pleuritis, bronchitis, pneumothorax, tumor, gastroesophageal reflux disease (GERD), esophageal spasm, Mallory-Weiss syndrome, peptic ulcer disease, pancreatitis, functional gastrointestinal pain, cervical or thoracic disk disease or arthritis, shoulder arthritis, costochondritis, subacromial  bursitis, anxiety or panic attack, herpes zoster, breast disorders, chest wall tumors, thoracic outlet syndrome, mediastinitis.  .   Co morbidities that complicate the patient evaluation: CAD, diabetes, HLD, HTN, CHF, rheumatoid arthritis  Physical exam performed. The pertinent findings include: cellulitic changes to the lower left calf, no significant unilateral leg swelling, neurovascularly intact in bilateral lower extremities  Lab Tests: I Ordered, and personally interpreted labs.  The pertinent results include: No leukocytosis, hemoglobin stable compared to prior.  Creatinine and electrolytes within normal limits.  Initial troponin 24, delta troponin of 47.   Imaging Studies ordered: I ordered imaging studies including chest x-ray and of left hip. I independently visualized and interpreted imaging which showed no acute cardiopulmonary abnormalities. No acute fractures or dislocations of the left hip, degenerative arthritic changes. I agree with the radiologist interpretation.   Cardiac Monitoring: The patient was maintained on a cardiac monitor.  My attending physician Dr. Pearline Cables viewed and interpreted the cardiac monitored which showed an underlying rhythm of: Sinus rhythm with minimal ST depression, unchanged compared to prior.   Medicines ordered and prescription drug management: I ordered medication including analgesics and muscle relaxer for polyarthralgias. Reevaluation of the patient after these medicines showed that the patient stayed the same. I have reviewed the patients home medicines and have made adjustments as needed.   Dispostion: After consideration of the diagnostic results and the patients response to treatment, I feel that patient is requiring admission. Consulted with hospitalist Dr. Myna Hidalgo for medical admission for ACS rule out.  Patient continues to have intermittent chest pain.  Low suspicion for acute heart failure exacerbation as patient remains hemodynamically stable with  good oxygen saturation, no significant peripheral edema compared to patient's baseline, chest x-ray negative for pulmonary edema or pleural effusions.  Low concern for acute DVT or PE. Dr. Myna Hidalgo  accepted patient and recommended giving ASA and consulting cardiology for potential heparinization. The patient appears reasonably stabilized for admission considering the current resources, flow, and capabilities available in the ED at this time, and I doubt any other [facility] requiring further screening and/or treatment in the ED prior to admission.  11:30pm Consulted with Dr. Einar Gip with cardiology about ACS rule out. He does not recommend heparin at this time.   I discussed this case with my attending physician Dr. Tomi Bamberger . Attending physician stated agreement with plan or made changes to plan which were implemented.    Final Clinical Impression(s) / ED Diagnoses Final diagnoses:  Chest pain, unspecified type    Rx / DC Orders ED Discharge Orders     None      Portions of this report may have been transcribed using voice recognition software. Every effort was made to ensure accuracy; however, inadvertent computerized transcription errors may be present.    Madylin Fairbank T, PA-C 01/31/21 2333    Jeanell Sparrow, DO 02/01/21 1122

## 2021-01-31 NOTE — ED Notes (Signed)
Pt moved to recliner due to intolerance of hip pain from lying in the stretcher.

## 2021-01-31 NOTE — ED Triage Notes (Signed)
Pt to ER via EMS from home with c/o intermittent chest pain for last several days.  PT states she has been taking tylenol for the pain with relief.  Today pain was worse, described as a dull pain in center of chest, non radiating.  PT has hx of CHF an long Covid and states SHOB is worse.  Pt is also endorsing L hip pain new today without injury.  Of note pt has hx of RA and was taken off of methotrexate recently.

## 2021-01-31 NOTE — ED Notes (Signed)
Pt returned to room from imaging. 

## 2021-02-01 ENCOUNTER — Encounter (HOSPITAL_COMMUNITY): Payer: Self-pay | Admitting: Family Medicine

## 2021-02-01 DIAGNOSIS — R079 Chest pain, unspecified: Secondary | ICD-10-CM | POA: Diagnosis not present

## 2021-02-01 DIAGNOSIS — Z6841 Body Mass Index (BMI) 40.0 and over, adult: Secondary | ICD-10-CM | POA: Diagnosis not present

## 2021-02-01 DIAGNOSIS — E896 Postprocedural adrenocortical (-medullary) hypofunction: Secondary | ICD-10-CM | POA: Diagnosis present

## 2021-02-01 DIAGNOSIS — K5900 Constipation, unspecified: Secondary | ICD-10-CM | POA: Diagnosis present

## 2021-02-01 DIAGNOSIS — R0602 Shortness of breath: Secondary | ICD-10-CM | POA: Diagnosis present

## 2021-02-01 DIAGNOSIS — E785 Hyperlipidemia, unspecified: Secondary | ICD-10-CM | POA: Diagnosis present

## 2021-02-01 DIAGNOSIS — Z9861 Coronary angioplasty status: Secondary | ICD-10-CM | POA: Diagnosis not present

## 2021-02-01 DIAGNOSIS — I10 Essential (primary) hypertension: Secondary | ICD-10-CM

## 2021-02-01 DIAGNOSIS — I110 Hypertensive heart disease with heart failure: Secondary | ICD-10-CM | POA: Diagnosis present

## 2021-02-01 DIAGNOSIS — I2510 Atherosclerotic heart disease of native coronary artery without angina pectoris: Secondary | ICD-10-CM | POA: Diagnosis present

## 2021-02-01 DIAGNOSIS — S81802A Unspecified open wound, left lower leg, initial encounter: Secondary | ICD-10-CM | POA: Diagnosis present

## 2021-02-01 DIAGNOSIS — M5431 Sciatica, right side: Secondary | ICD-10-CM | POA: Diagnosis present

## 2021-02-01 DIAGNOSIS — E1165 Type 2 diabetes mellitus with hyperglycemia: Secondary | ICD-10-CM | POA: Diagnosis present

## 2021-02-01 DIAGNOSIS — E871 Hypo-osmolality and hyponatremia: Secondary | ICD-10-CM | POA: Diagnosis not present

## 2021-02-01 DIAGNOSIS — M069 Rheumatoid arthritis, unspecified: Secondary | ICD-10-CM | POA: Diagnosis present

## 2021-02-01 DIAGNOSIS — G4733 Obstructive sleep apnea (adult) (pediatric): Secondary | ICD-10-CM | POA: Diagnosis present

## 2021-02-01 DIAGNOSIS — Z20822 Contact with and (suspected) exposure to covid-19: Secondary | ICD-10-CM | POA: Diagnosis present

## 2021-02-01 DIAGNOSIS — J9611 Chronic respiratory failure with hypoxia: Secondary | ICD-10-CM | POA: Diagnosis present

## 2021-02-01 DIAGNOSIS — U099 Post covid-19 condition, unspecified: Secondary | ICD-10-CM | POA: Diagnosis present

## 2021-02-01 DIAGNOSIS — I2582 Chronic total occlusion of coronary artery: Secondary | ICD-10-CM | POA: Diagnosis present

## 2021-02-01 DIAGNOSIS — D649 Anemia, unspecified: Secondary | ICD-10-CM | POA: Diagnosis present

## 2021-02-01 DIAGNOSIS — E1159 Type 2 diabetes mellitus with other circulatory complications: Secondary | ICD-10-CM | POA: Diagnosis not present

## 2021-02-01 DIAGNOSIS — I493 Ventricular premature depolarization: Secondary | ICD-10-CM | POA: Diagnosis present

## 2021-02-01 DIAGNOSIS — I5033 Acute on chronic diastolic (congestive) heart failure: Secondary | ICD-10-CM | POA: Diagnosis present

## 2021-02-01 DIAGNOSIS — J9612 Chronic respiratory failure with hypercapnia: Secondary | ICD-10-CM | POA: Diagnosis present

## 2021-02-01 DIAGNOSIS — N179 Acute kidney failure, unspecified: Secondary | ICD-10-CM | POA: Diagnosis not present

## 2021-02-01 DIAGNOSIS — G8929 Other chronic pain: Secondary | ICD-10-CM | POA: Diagnosis present

## 2021-02-01 LAB — LIPID PANEL
Cholesterol: 145 mg/dL (ref 0–200)
HDL: 40 mg/dL — ABNORMAL LOW (ref >40)
LDL Cholesterol: 38 mg/dL (ref 0–99)
Total CHOL/HDL Ratio: 3.6 RATIO
Triglycerides: 337 mg/dL — ABNORMAL HIGH (ref <150)
VLDL: 67 mg/dL — ABNORMAL HIGH (ref 0–40)

## 2021-02-01 LAB — BASIC METABOLIC PANEL
Anion gap: 9 (ref 5–15)
BUN: 19 mg/dL (ref 8–23)
CO2: 30 mmol/L (ref 22–32)
Calcium: 9.1 mg/dL (ref 8.9–10.3)
Chloride: 98 mmol/L (ref 98–111)
Creatinine, Ser: 0.96 mg/dL (ref 0.44–1.00)
GFR, Estimated: >60 mL/min (ref >60)
Glucose, Bld: 168 mg/dL — ABNORMAL HIGH (ref 70–99)
Potassium: 4.3 mmol/L (ref 3.5–5.1)
Sodium: 137 mmol/L (ref 135–145)

## 2021-02-01 LAB — CBC
HCT: 36.2 % (ref 36.0–46.0)
Hemoglobin: 11.2 g/dL — ABNORMAL LOW (ref 12.0–15.0)
MCH: 29.6 pg (ref 26.0–34.0)
MCHC: 30.9 g/dL (ref 30.0–36.0)
MCV: 95.8 fL (ref 80.0–100.0)
Platelets: 208 10*3/uL (ref 150–400)
RBC: 3.78 MIL/uL — ABNORMAL LOW (ref 3.87–5.11)
RDW: 16.7 % — ABNORMAL HIGH (ref 11.5–15.5)
WBC: 7.6 10*3/uL (ref 4.0–10.5)
nRBC: 0 % (ref 0.0–0.2)

## 2021-02-01 LAB — CBG MONITORING, ED
Glucose-Capillary: 170 mg/dL — ABNORMAL HIGH (ref 70–99)
Glucose-Capillary: 129 mg/dL — ABNORMAL HIGH (ref 70–99)
Glucose-Capillary: 181 mg/dL — ABNORMAL HIGH (ref 70–99)

## 2021-02-01 LAB — HEMOGLOBIN A1C
Hgb A1c MFr Bld: 7.2 % — ABNORMAL HIGH (ref 4.8–5.6)
Mean Plasma Glucose: 159.94 mg/dL

## 2021-02-01 LAB — TROPONIN I (HIGH SENSITIVITY): Troponin I (High Sensitivity): 81 ng/L — ABNORMAL HIGH (ref <18)

## 2021-02-01 LAB — GLUCOSE, CAPILLARY
Glucose-Capillary: 176 mg/dL — ABNORMAL HIGH (ref 70–99)
Glucose-Capillary: 160 mg/dL — ABNORMAL HIGH (ref 70–99)

## 2021-02-01 MED ORDER — DICLOFENAC SODIUM 1 % EX GEL
2.0000 g | Freq: Four times a day (QID) | CUTANEOUS | Status: DC | PRN
  Administered 2021-02-01: 4 g via TOPICAL
  Administered 2021-02-05: 2 g via TOPICAL
  Filled 2021-02-01: qty 100

## 2021-02-01 MED ORDER — ROSUVASTATIN CALCIUM 20 MG PO TABS
20.0000 mg | ORAL_TABLET | Freq: Every day | ORAL | Status: DC
  Administered 2021-02-01 – 2021-02-04 (×5): 20 mg via ORAL
  Filled 2021-02-01 (×5): qty 1

## 2021-02-01 MED ORDER — METHOCARBAMOL 1000 MG/10ML IJ SOLN
500.0000 mg | Freq: Four times a day (QID) | INTRAVENOUS | Status: DC | PRN
  Administered 2021-02-01 (×2): 500 mg via INTRAVENOUS
  Filled 2021-02-01 (×3): qty 5

## 2021-02-01 MED ORDER — MELATONIN 5 MG PO TABS
5.0000 mg | ORAL_TABLET | Freq: Every day | ORAL | Status: DC
  Filled 2021-02-01 (×4): qty 1

## 2021-02-01 MED ORDER — AMLODIPINE BESYLATE 5 MG PO TABS
5.0000 mg | ORAL_TABLET | Freq: Every day | ORAL | Status: DC
  Administered 2021-02-01 – 2021-02-05 (×5): 5 mg via ORAL
  Filled 2021-02-01 (×7): qty 1

## 2021-02-01 MED ORDER — FUROSEMIDE 10 MG/ML IJ SOLN
40.0000 mg | Freq: Two times a day (BID) | INTRAMUSCULAR | Status: DC
  Administered 2021-02-01 – 2021-02-02 (×4): 40 mg via INTRAVENOUS
  Filled 2021-02-01 (×4): qty 4

## 2021-02-01 MED ORDER — ENOXAPARIN SODIUM 40 MG/0.4ML IJ SOSY
40.0000 mg | PREFILLED_SYRINGE | Freq: Every day | INTRAMUSCULAR | Status: DC
  Administered 2021-02-01 – 2021-02-05 (×5): 40 mg via SUBCUTANEOUS
  Filled 2021-02-01 (×5): qty 0.4

## 2021-02-01 MED ORDER — FLUTICASONE FUROATE-VILANTEROL 200-25 MCG/ACT IN AEPB
1.0000 | INHALATION_SPRAY | Freq: Every day | RESPIRATORY_TRACT | Status: DC
  Administered 2021-02-01 – 2021-02-05 (×5): 1 via RESPIRATORY_TRACT
  Filled 2021-02-01: qty 28

## 2021-02-01 MED ORDER — ACETAMINOPHEN 650 MG RE SUPP: 650.0000 mg | Freq: Four times a day (QID) | RECTAL | Status: DC | PRN

## 2021-02-01 MED ORDER — LISINOPRIL 10 MG PO TABS
10.0000 mg | ORAL_TABLET | Freq: Every day | ORAL | Status: DC
  Administered 2021-02-01 – 2021-02-03 (×3): 10 mg via ORAL
  Filled 2021-02-01 (×3): qty 1

## 2021-02-01 MED ORDER — ALBUTEROL SULFATE (2.5 MG/3ML) 0.083% IN NEBU: 2.5000 mg | INHALATION_SOLUTION | Freq: Four times a day (QID) | RESPIRATORY_TRACT | Status: DC | PRN

## 2021-02-01 MED ORDER — RANOLAZINE ER 500 MG PO TB12
500.0000 mg | ORAL_TABLET | Freq: Two times a day (BID) | ORAL | Status: DC
  Administered 2021-02-01 – 2021-02-05 (×10): 500 mg via ORAL
  Filled 2021-02-01 (×11): qty 1

## 2021-02-01 MED ORDER — INSULIN GLARGINE-YFGN 100 UNIT/ML ~~LOC~~ SOLN
50.0000 [IU] | Freq: Two times a day (BID) | SUBCUTANEOUS | Status: DC
  Administered 2021-02-01 – 2021-02-05 (×10): 50 [IU] via SUBCUTANEOUS
  Filled 2021-02-01 (×11): qty 0.5

## 2021-02-01 MED ORDER — UMECLIDINIUM BROMIDE 62.5 MCG/ACT IN AEPB
1.0000 | INHALATION_SPRAY | Freq: Every day | RESPIRATORY_TRACT | Status: DC
  Administered 2021-02-01 – 2021-02-05 (×5): 1 via RESPIRATORY_TRACT
  Filled 2021-02-01: qty 7

## 2021-02-01 MED ORDER — ASPIRIN EC 81 MG PO TBEC
81.0000 mg | DELAYED_RELEASE_TABLET | Freq: Every day | ORAL | Status: DC
  Administered 2021-02-01 – 2021-02-05 (×5): 81 mg via ORAL
  Filled 2021-02-01 (×5): qty 1

## 2021-02-01 MED ORDER — CARVEDILOL 25 MG PO TABS
25.0000 mg | ORAL_TABLET | Freq: Two times a day (BID) | ORAL | Status: DC
  Administered 2021-02-01 – 2021-02-05 (×9): 25 mg via ORAL
  Filled 2021-02-01: qty 2
  Filled 2021-02-01 (×9): qty 1

## 2021-02-01 MED ORDER — INSULIN ASPART 100 UNIT/ML IJ SOLN
0.0000 [IU] | Freq: Three times a day (TID) | INTRAMUSCULAR | Status: DC
  Administered 2021-02-01 (×2): 3 [IU] via SUBCUTANEOUS
  Administered 2021-02-01: 2 [IU] via SUBCUTANEOUS
  Administered 2021-02-02: 5 [IU] via SUBCUTANEOUS
  Administered 2021-02-02 – 2021-02-03 (×5): 3 [IU] via SUBCUTANEOUS
  Administered 2021-02-04: 2 [IU] via SUBCUTANEOUS
  Administered 2021-02-04: 3 [IU] via SUBCUTANEOUS
  Administered 2021-02-04: 2 [IU] via SUBCUTANEOUS
  Administered 2021-02-05 (×2): 3 [IU] via SUBCUTANEOUS

## 2021-02-01 MED ORDER — ACETAMINOPHEN 325 MG PO TABS
650.0000 mg | ORAL_TABLET | Freq: Four times a day (QID) | ORAL | Status: DC | PRN
  Filled 2021-02-01: qty 2

## 2021-02-01 MED ORDER — FENTANYL CITRATE PF 50 MCG/ML IJ SOSY
12.5000 ug | PREFILLED_SYRINGE | INTRAMUSCULAR | Status: DC | PRN
  Administered 2021-02-01 – 2021-02-04 (×21): 50 ug via INTRAVENOUS
  Filled 2021-02-01 (×21): qty 1

## 2021-02-01 MED ORDER — INSULIN ASPART 100 UNIT/ML IJ SOLN
6.0000 [IU] | Freq: Three times a day (TID) | INTRAMUSCULAR | Status: DC
  Administered 2021-02-01 – 2021-02-05 (×14): 6 [IU] via SUBCUTANEOUS

## 2021-02-01 MED ORDER — INSULIN ASPART 100 UNIT/ML IJ SOLN
0.0000 [IU] | Freq: Every day | INTRAMUSCULAR | Status: DC
  Administered 2021-02-02: 2 [IU] via SUBCUTANEOUS

## 2021-02-01 MED ORDER — SODIUM CHLORIDE 0.9% FLUSH
3.0000 mL | Freq: Two times a day (BID) | INTRAVENOUS | Status: DC
  Administered 2021-02-01 – 2021-02-05 (×9): 3 mL via INTRAVENOUS

## 2021-02-01 NOTE — Progress Notes (Signed)
Patient refused CPAP for tonight. Patient states she is unable to tolerate the CPAP mask and can only use nasal pillow mask. RT instructed patient to have RT called if she changes her mind and decides to try the hospital's CPAP. RT will monitor as needed.

## 2021-02-01 NOTE — Progress Notes (Signed)
PROGRESS NOTE    [name]  VVO:[id number] DOB: July 09, 1945 DOA: 01/31/2021 PCP: Lajean Manes, MD   Brief Narrative:  The patient is a 76 year old morbidly obese Caucasian female with a past medical history significant for but not limited to chronic diastolic CHF, CAD, chronic hypoxic respiratory failure on 3 L of supplemental oxygen via nasal cannula, OSA on CPAP, hypertension, insulin-dependent diabetes mellitus type 2, history of pheochromocytoma status post right adrenalectomy and psoriasis who presented with several days of intermittent chest pain and increased leg swelling.  She reported that she been experiencing chest discomfort intermittently over the last several days that is a central dull and nonradiating and this is this happens when she is fluid overload in the past.  She denies any nausea, diaphoresis and did not have a pressure sensation as when she did when she had a prior MI.  She called EMS instructed to take 4 baby aspirin and was brought to the ED.  Upon arrival to the ED she is afebrile and saturating 100% on 3 L submental oxygen via nasal cannula.  She has minimal ST depressions inferior laterally and chest x-ray showed some streaky bibasilar opacities.  Troponin level initially was 24 and then trended up 47 and then finally 81.  BNP was 114.  ED discussed with cardiology who advised against heparin at this time.  She was admitted and is now being diuresed and cardiology is following.  Assessment & Plan:   Principal Problem:   Acute on chronic diastolic CHF (congestive heart failure) (HCC) Active Problems:   CAD (coronary artery disease), native coronary artery   Chest pain   Essential hypertension   OSA on CPAP   DM2 (diabetes mellitus, type 2) (HCC)   Chronic respiratory failure with hypoxia and hypercapnia (HCC)   Leg wound, left  Acute on Chronic Diastolic CHF  - Pt with HFpEF presents with increased leg swelling and chest pain similar to when she has been  hypervolemic and is found to have elevated BNP at 113.8 (previously normal)   -Continue Diureses with IV Lasix,  -Continue Coreg and lisinopril, monitor wt and I/Os, -Update Echo    -Cardiology was consulted for further evaluation recommendations and recommending continuing diuresis with IV Lasix and transition to oral diuretics soon   Chest pain  -Pt with hx of CAD s/p angioplasty in 1992 and LHC at [place] in 2015 with total occlusion of RCA with collaterals presents with chest pain  -She reports the pain is similar to when she has had volume o/l in the past and unlike her prior angina  -Troponin is 24 then 47 in ED; ED PA spoke with cardiology who advised against IV heparin  -Troponin trended up to 81  -Continue ASA, statin, Ranexa, and beta-blocker; trend troponin, and follow-up echo -Cardiology consulted for further evaluation and evaluations; she is extremely sensitive sublingual nitroglycerin this is being avoided; she has no recent lipid profile so they are going to order   Chronic Hypoxic respiratory failure; OSA  -Uses 3 Lpm supplemental O2 since suffering from COVID two yrs ago, also uses CPAP at home  -SpO2: 100 % O2 Flow Rate (L/min): 3 L/min -Appears stable, continue supplemental O2 and CPAP qHS  -Pete chest x-ray in a.m. and will need an Ambulatory home O2 screen prior to discharge  Left Leg Wound -WOC Nurse consulted  -Chronic wound involving lower Lt leg posteriorly, does not appear infected    IDDM  -A1c was 7.7% in April 2022 and repeat was  7.2 this Admission -Continue CBG checks and continue with moderate NovoLog sliding scale insulin AC/HS -Continue with Semglee 15 units twice daily and insulin Aspart 6 Units 3 Times Daily with meals  -CBGs ranging from 101-181   Morbid Obesity -Complicates overall prognosis and care -Estimated body mass index is 56.64 kg/m as calculated from the following:   Height as of this encounter: 5\' 4"  (1.626 m).   Weight as of this  encounter: 149.7 kg.  -Weight Loss and Dietary Counseling given  DVT prophylaxis: Enoxaparin 40 mg sq q24h Code Status: FULL CODE  Family Communication: No family present at bedside  Disposition Plan: Pending further clinical improvement and evaluation by PT/OT  Status is: Observation  The patient will require care spanning > 2 midnights and should be moved to inpatient because: Still getting IV Diuresis and needs Cardiology Clearance   Consultants:  Cardiology  Procedures:  ECHOCARDIOGRAM  Antimicrobials:  Anti-infectives (From admission, onward)    None        Subjective: Seen and examined at bedside and was having some chest pain earlier.  Still feels a little bit short of breath.  No nausea or vomiting.  States that her left leg is hurting given her chronic wound.  No other concerns or complaints this time.  Objective: Vitals:   02/01/21 0400 02/01/21 0415 02/01/21 0700 02/01/21 0800  BP: 135/70 (!) 149/70 (!) 153/93 (!) 140/94  Pulse: 80 89 82 90  Resp: 18 (!) 22 17 (!) 21  Temp:      TempSrc:      SpO2: 97% 99% 99% 100%  Weight:      Height:        Intake/Output Summary (Last 24 hours) at 02/01/2021 0951 Last data filed at 02/01/2021 0147 Gross per 24 hour  Intake 3 ml  Output --  Net 3 ml   Filed Weights   01/31/21 1739  Weight: (!) 149.7 kg   Examination: Physical Exam:  Constitutional: WN/WD super morbidly obese Caucasian female currently no acute distress appears calm and sitting up in a chair Eyes:  Lids and conjunctivae normal, sclerae anicteric  ENMT: External Ears, Nose appear normal. Grossly normal hearing. Mucous membranes are moist.  Neck: Appears normal, supple, no cervical masses, normal ROM, no appreciable thyromegaly; no appreciable JVD Respiratory: Diminished to auscultation bilaterally with coarse breath sounds, no wheezing, rales, rhonchi or crackles. Normal respiratory effort and patient is not tachypenic. No accessory muscle use.   Unlabored breathing but is wearing supplemental oxygen via nasal cannula Cardiovascular: RRR, no murmurs / rubs / gallops. S1 and S2 auscultated. 1+ lower extremity edema Abdomen: Soft, non-tender, distended secondary body habitus. Bowel sounds positive.  GU: Deferred. Musculoskeletal: No clubbing / cyanosis of digits/nails. No joint deformity upper and lower extremities.  Skin: Has a posterior leg wound on the left leg.  No appreciable rashes or lesions on limited skin evaluation.  Skin is warm and dry.  Neurologic: CN 2-12 grossly intact with no focal deficits. Romberg sign and cerebellar reflexes not assessed.  Psychiatric: Normal judgment and insight. Alert and oriented x 3. Normal mood and appropriate affect.   Data Reviewed: I have personally reviewed following labs and imaging studies  CBC: Recent Labs  Lab 01/31/21 1829 02/01/21 0325  WBC 7.8 7.6  HGB 11.0* 11.2*  HCT 34.4* 36.2  MCV 93.7 95.8  PLT 203 696   Basic Metabolic Panel: Recent Labs  Lab 01/31/21 1829 02/01/21 0325  NA 138 137  K 4.1 4.3  CL 98 98  CO2 29 30  GLUCOSE 115* 168*  BUN 23 19  CREATININE 0.96 0.96  CALCIUM 9.5 9.1   GFR: Estimated Creatinine Clearance: 76.4 mL/min (by C-G formula based on SCr of 0.96 mg/dL). Liver Function Tests: No results for input(s): AST, ALT, ALKPHOS, BILITOT, PROT, ALBUMIN in the last 168 hours. No results for input(s): LIPASE, AMYLASE in the last 168 hours. No results for input(s): AMMONIA in the last 168 hours. Coagulation Profile: No results for input(s): INR, PROTIME in the last 168 hours. Cardiac Enzymes: No results for input(s): CKTOTAL, CKMB, CKMBINDEX, TROPONINI in the last 168 hours. BNP (last 3 results) No results for input(s): PROBNP in the last 8760 hours. HbA1C: Recent Labs    02/01/21 0325  HGBA1C 7.2*   CBG: Recent Labs  Lab 01/31/21 2308 02/01/21 0149 02/01/21 0725  GLUCAP 101* 170* 129*   Lipid Profile: No results for input(s): CHOL,  HDL, LDLCALC, TRIG, CHOLHDL, LDLDIRECT in the last 72 hours. Thyroid Function Tests: No results for input(s): TSH, T4TOTAL, FREET4, T3FREE, THYROIDAB in the last 72 hours. Anemia Panel: No results for input(s): VITAMINB12, FOLATE, FERRITIN, TIBC, IRON, RETICCTPCT in the last 72 hours. Sepsis Labs: No results for input(s): PROCALCITON, LATICACIDVEN in the last 168 hours.  Recent Results (from the past 240 hour(s))  Resp Panel by RT-PCR (Flu A&B, Covid) Nasopharyngeal Swab     Status: None   Collection Time: 01/31/21 11:02 PM   Specimen: Nasopharyngeal Swab; Nasopharyngeal(NP) swabs in vial transport medium  Result Value Ref Range Status   SARS Coronavirus 2 by RT PCR NEGATIVE NEGATIVE Final    Comment: (NOTE) SARS-CoV-2 target nucleic acids are NOT DETECTED.  The SARS-CoV-2 RNA is generally detectable in upper respiratory specimens during the acute phase of infection. The lowest concentration of SARS-CoV-2 viral copies this assay can detect is 138 copies/mL. A negative result does not preclude SARS-Cov-2 infection and should not be used as the sole basis for treatment or other patient management decisions. A negative result may occur with  improper specimen collection/handling, submission of specimen other than nasopharyngeal swab, presence of viral mutation(s) within the areas targeted by this assay, and inadequate number of viral copies(<138 copies/mL). A negative result must be combined with clinical observations, patient history, and epidemiological information. The expected result is Negative.  Fact Sheet for Patients:  EntrepreneurPulse.com.au  Fact Sheet for Healthcare Providers:  IncredibleEmployment.be  This test is no t yet approved or cleared by the [place] FDA and  has been authorized for detection and/or diagnosis of SARS-CoV-2 by FDA under an Emergency Use Authorization (EUA). This EUA will remain  in effect (meaning this  test can be used) for the duration of the COVID-19 declaration under Section 564(b)(1) of the Act, 21 U.S.C.section 360bbb-3(b)(1), unless the authorization is terminated  or revoked sooner.       Influenza A by PCR NEGATIVE NEGATIVE Final   Influenza B by PCR NEGATIVE NEGATIVE Final    Comment: (NOTE) The Xpert Xpress SARS-CoV-2/FLU/RSV plus assay is intended as an aid in the diagnosis of influenza from Nasopharyngeal swab specimens and should not be used as a sole basis for treatment. Nasal washings and aspirates are unacceptable for Xpert Xpress SARS-CoV-2/FLU/RSV testing.  Fact Sheet for Patients: EntrepreneurPulse.com.au  Fact Sheet for Healthcare Providers: IncredibleEmployment.be  This test is not yet approved or cleared by the [place] FDA and has been authorized for detection and/or diagnosis of SARS-CoV-2 by FDA under an Emergency Use Authorization (EUA). This  EUA will remain in effect (meaning this test can be used) for the duration of the COVID-19 declaration under Section 564(b)(1) of the Act, 21 U.S.C. section 360bbb-3(b)(1), unless the authorization is terminated or revoked.  Performed at [facility], [place] [address], [place], [place] [address]     RN Pressure Injury Documentation:     Estimated body mass index is 56.64 kg/m as calculated from the following:   Height as of this encounter: 5\' 4"  (1.626 m).   Weight as of this encounter: 149.7 kg.  Malnutrition Type:   Malnutrition Characteristics:   Nutrition Interventions:    Radiology Studies: DG Chest 2 View  Result Date: 01/31/2021 CLINICAL DATA:  Chest pain EXAM: CHEST - 2 VIEW COMPARISON:  05/03/2020 FINDINGS: Stable cardiomegaly. Aortic atherosclerosis. Streaky bibasilar opacities. No overt edema. No pleural effusion or pneumothorax. IMPRESSION: Streaky bibasilar opacities, favor atelectasis. Electronically Signed   By: Davina Poke D.O.   On:  01/31/2021 19:17   DG Pelvis 1-2 Views  Result Date: 01/31/2021 CLINICAL DATA:  Pelvic pain particularly on the left, initial encounter EXAM: PELVIS - 1 VIEW COMPARISON:  None. FINDINGS: Pelvic ring is intact. Degenerative changes of the hip joints are noted bilaterally. No acute fracture or dislocation is seen. No soft tissue abnormality is noted. IMPRESSION: Degenerative changes without acute abnormality. Electronically Signed   By: Inez Catalina M.D.   On: 01/31/2021 19:13   DG Femur Min 2 Views Left  Result Date: 01/31/2021 CLINICAL DATA:  Left hip pain, no known injury, initial encounter EXAM: LEFT FEMUR 2 VIEWS COMPARISON:  None. FINDINGS: Degenerative changes of left hip joint are noted. No acute fracture or dislocation is noted. Degenerative changes in the knee joint are seen as well. No soft tissue abnormality is noted. IMPRESSION: Nerve changes without acute abnormality. Electronically Signed   By: Inez Catalina M.D.   On: 01/31/2021 19:13    Scheduled Meds:  amLODipine  5 mg Oral Daily   aspirin EC  81 mg Oral Daily   carvedilol  25 mg Oral BID WC   enoxaparin (LOVENOX) injection  40 mg Subcutaneous Daily   fluticasone furoate-vilanterol  1 puff Inhalation Daily   furosemide  40 mg Intravenous BID   insulin aspart  0-15 Units Subcutaneous TID WC   insulin aspart  0-5 Units Subcutaneous QHS   insulin aspart  6 Units Subcutaneous TID WC   insulin glargine-yfgn  50 Units Subcutaneous BID   lisinopril  10 mg Oral Daily   melatonin  5 mg Oral QHS   ranolazine  500 mg Oral BID   rosuvastatin  20 mg Oral QHS   sodium chloride flush  3 mL Intravenous Q12H   umeclidinium bromide  1 puff Inhalation Daily   Continuous Infusions:  methocarbamol (ROBAXIN) IV      LOS: 0 days   Kerney Elbe, DO [facility] PAGER is on AMION  If 7PM-7AM, please contact night-coverage [email or web address]

## 2021-02-01 NOTE — Consult Note (Signed)
Cardiology Consultation:   Patient ID: [name] MRN: [id number]; DOB: 04/29/1945  Admit date: 01/31/2021 Date of Consult: 02/01/2021  PCP:  Lajean Manes, MD   [facility] Providers Cardiologist:  Evalina Field, MD        Patient Profile:   [name] is a 76 y.o. female with a hx of chronic resp failure on 3 lpm O2 since having Covid 2020, HFpEF, DM, HTN, OSA on CPAP, RCA 100% (med rx) 2015, morbid obesity, who is being seen 02/01/2021 for the evaluation of CP, CHF at the request of Dr Alfredia Ferguson.  History of Present Illness:   [name] was last seen by Dr Audie Box 09/2019. 12/2020, she had some volume overload, treated w/ short-term extra Demadex.   Pt admitted 01/15 am, w/ SOB, CHF exacerbation and CP.   [name] stated that the problems all started after she was taken off methotrexate for her RA.  She started having problems with swelling.  She does not weigh daily.  Her joint pain got a lot worse.  Her mobility and movement decreased.  She had COVID in 2020, and was discharged on oxygen, but was able to wean herself off.  However, she was hospitalized in April 2022 for shortness of breath.  Ever since then, she has been on oxygen in the liter flow has been gradually increased up to 3 L.  She developed 2 wounds on her legs, and has been seen in wound care.  1 wound has resolved, but the other one has not.  Her shortness of breath was bad enough, but then she developed chest pain.  The pain is a pressure in the middle of her chest, but not nearly to the level that it was when she had her heart attacks.  She gets angina upon occasion, and it resolves with rest and Tylenol.  She has significant hypotension with sublingual nitroglycerin and does not take it.  Since being admitted, the chest pain has resolved.  She is still on oxygen at a higher flow rate than previously.  Since being in the ER, she feels some better.  However, she is having significant knee pain  from 2 different kinds of arthritis, and has pain in other joints as well.   Past Medical History:  Diagnosis Date   Coronary artery disease    Diabetes mellitus without complication ([place])    Hyperlipidemia    Hypertension    OSA (obstructive sleep apnea)     Past Surgical History:  Procedure Laterality Date   CARDIAC CATHETERIZATION     pheochromocytoma     TONSILLECTOMY       Home Medications:  Prior to Admission medications   Medication Sig Start Date End Date Taking? Authorizing Provider  acetaminophen (TYLENOL) 500 MG tablet Take 500 mg by mouth every 8 (eight) hours as needed for moderate pain or headache.   Yes [provider]  albuterol (VENTOLIN HFA) 108 (90 Base) MCG/ACT inhaler Inhale 2 puffs into the lungs 4 (four) times daily as needed for wheezing or shortness of breath.   Yes [provider]  amLODipine (NORVASC) 5 MG tablet Take 5 mg by mouth daily.   Yes [provider]  aspirin EC 81 MG tablet Take 81 mg by mouth daily. Swallow whole.   Yes [provider]  BREO ELLIPTA 200-25 MCG/INH AEPB Inhale 1 puff into the lungs daily. 04/22/20  Yes [provider]  carvedilol (COREG) 25 MG tablet Take 25 mg by mouth 2 (two)  times daily with a meal.   Yes [provider]  cetirizine (ZYRTEC) 10 MG tablet Take 10 mg by mouth daily.   Yes [provider]  Cholecalciferol (VITAMIN D3) 125 MCG (5000 UT) CAPS Take 5,000 Units by mouth.   Yes [provider]  diclofenac Sodium (VOLTAREN) 1 % GEL Apply 2-4 g topically 4 (four) times daily as needed (pain).   Yes [provider]  ferrous sulfate 325 (65 FE) MG tablet Take 325 mg by mouth every Wednesday.   Yes [provider]  fluticasone (FLONASE) 50 MCG/ACT nasal spray Place 1 spray into both nostrils daily as needed for allergies or rhinitis.   Yes [provider]  glucose 4 GM chewable tablet Chew 3 tablets by mouth as needed for low  blood sugar.   Yes [provider]  insulin NPH-regular Human (70-30) 100 UNIT/ML injection Inject 73 Units into the skin with breakfast, with lunch, and with evening meal.   Yes [provider]  Ixekizumab 80 MG/ML SOAJ Inject into the skin. Give subcutaneous every 4 weeks   Yes [provider]  lisinopril (ZESTRIL) 10 MG tablet Take 10 mg by mouth daily.   Yes [provider]  melatonin 5 MG TABS 1 tablet in the evening 05/01/20  Yes [provider]  meloxicam (MOBIC) 7.5 MG tablet Take 7.5 mg by mouth in the morning and at bedtime.   Yes [provider]  Multiple Vitamin (MULTIVITAMIN ADULT PO) Take 1 tablet by mouth daily.   Yes [provider]  polyethylene glycol powder (GLYCOLAX/MIRALAX) 17 GM/SCOOP powder Take 17 g by mouth daily as needed for mild constipation.   Yes [provider]  ranolazine (RANEXA) 1000 MG SR tablet Take 500 mg by mouth 2 (two) times daily.   Yes [provider]  rosuvastatin (CRESTOR) 20 MG tablet Take 20 mg by mouth at bedtime.   Yes [provider]  tiotropium (SPIRIVA HANDIHALER) 18 MCG inhalation capsule Place 18 mcg into inhaler and inhale daily.   Yes [provider]  torsemide (DEMADEX) 100 MG tablet TAKE 1/2 TABLET = 50 MG BY MOUTH ONCE DAILY Patient taking differently: Take 50 mg by mouth daily. 12/02/20  Yes O'Neal, Cassie Freer, MD  Turmeric 500 MG CAPS Take 500 mg by mouth daily.   Yes [provider]  vitamin B-12 (CYANOCOBALAMIN) 1000 MCG tablet Take 1,000 mcg by mouth daily.   Yes [provider]  acetaminophen (TYLENOL) 650 MG CR tablet Take 1,300 mg by mouth in the morning and at bedtime. Patient not taking: Reported on 01/31/2021    [provider]  azithromycin (ZITHROMAX) 250 MG tablet Take 1 tablet (250 mg total) by mouth daily. Patient not taking: Reported on 01/31/2021 05/04/20   Thurnell Lose, MD  glucose blood test  strip as directed. Dispense Blood Glucose Test Strips to patient for home use. Testing 4 times daily. 01/16/19   [provider]  Insulin Syringe-Needle U-100 (INSULIN SYRINGE 1CC/31GX5/16") 31G X 5/16" 1 ML MISC USE TO INJECT INSULIN TWO TIMES A DAY 03/28/19   [provider]  Lancets 33G MISC as directed. Testing 4 times daily.  Dispense Lancets to patient for home use. 01/16/19   [provider]  polyethylene glycol (MIRALAX / GLYCOLAX) 17 g packet Take 17 g by mouth daily as needed for mild constipation.    [provider]  Respiratory Therapy Supplies [place] as directed. BIPAP 18/14 cm H2O with humidifier, mask,  tubing, and headgear. 03/14/19   [provider]    Inpatient Medications: Scheduled Meds:  amLODipine  5 mg Oral Daily   aspirin EC  81 mg Oral Daily   carvedilol  25 mg Oral BID WC   enoxaparin (LOVENOX) injection  40 mg Subcutaneous Daily   fluticasone furoate-vilanterol  1 puff Inhalation Daily   furosemide  40 mg Intravenous BID   insulin aspart  0-15 Units Subcutaneous TID WC   insulin aspart  0-5 Units Subcutaneous QHS   insulin aspart  6 Units Subcutaneous TID WC   insulin glargine-yfgn  50 Units Subcutaneous BID   lisinopril  10 mg Oral Daily   melatonin  5 mg Oral QHS   ranolazine  500 mg Oral BID   rosuvastatin  20 mg Oral QHS   sodium chloride flush  3 mL Intravenous Q12H   umeclidinium bromide  1 puff Inhalation Daily   Continuous Infusions:  methocarbamol (ROBAXIN) IV Stopped (02/01/21 1259)   PRN Meds: acetaminophen **OR** acetaminophen, albuterol, diclofenac Sodium, fentaNYL (SUBLIMAZE) injection, methocarbamol (ROBAXIN) IV  Allergies:    Allergies  Allergen Reactions   Metaxalone Anaphylaxis   Nitroglycerin     BP bottoms out   Tramadol-Acetaminophen Anaphylaxis   Icosapent Ethyl Other (See Comments)    Stomach pain, gas, diarrhea, headache from Vascepa   Brompheniramine-Pseudoeph     Feels drunk    Meprobamate    Metformin Nausea Only    "flushes"   Methyldopa     Other reaction(s): Unknown   Metoprolol     Other reaction(s): Unknown   Procaine     Passes out   Pseudoephedrine Hcl     "feel drunk"   Tramadol     "Made me go wonky, got fluid in my lungs"    Social History:   Social History   Socioeconomic History   Marital status: Widowed    Spouse name: Not on file   Number of children: Not on file   Years of education: Not on file   Highest education level: Not on file  Occupational History   Occupation: retired  Tobacco Use   Smoking status: Former    Packs/day: 2.00    Years: 10.00    Pack years: 20.00    Types: Cigarettes    Quit date: 08/15/1989    Years since quitting: 31.4   Smokeless tobacco: Never  Substance and Sexual Activity   Alcohol use: Not Currently   Drug use: Not Currently   Sexual activity: Not on file  Other Topics Concern   Not on file  Social History Narrative   Not on file   Social Determinants of Health   Financial Resource Strain: Not on file  Food Insecurity: Not on file  Transportation Needs: Not on file  Physical Activity: Not on file  Stress: Not on file  Social Connections: Not on file  Intimate Partner Violence: Not on file    Family History:   Family History  Problem Relation Age of Onset   Heart attack Mother    Stroke Mother    Heart attack Father      ROS:  Please see the history of present illness.  All other ROS reviewed and negative.     Physical Exam/Data:   Vitals:   02/01/21 1000 02/01/21 1230 02/01/21 1245 02/01/21 1315  BP: 134/64 117/66 129/62 131/66  Pulse: 76 76 73 79  Resp: 16 15 15 16   Temp:      TempSrc:  SpO2: 100% 99% 100% 100%  Weight:      Height:        Intake/Output Summary (Last 24 hours) at 02/01/2021 1329 Last data filed at 02/01/2021 1259 Gross per 24 hour  Intake 53 ml  Output --  Net 53 ml   Last 3 Weights 01/31/2021 05/03/2020 02/22/2020  Weight (lbs) 330 lb 333 lb  5.4 oz 320 lb  Weight (kg) 149.687 kg 151.2 kg 145.151 kg     Body mass index is 56.64 kg/m.  General:  Well nourished, well developed, in mild respiratory distress HEENT: normal Neck: no JVD seen, difficult to assess secondary to body habitus Vascular: No carotid bruits; Distal radial pulses 2+ bilaterally Cardiac:  normal S1, S2; RRR; no murmur  Lungs: Decreased breath sounds with basilar Rales bilaterally, no wheezing, rhonchi  Abd: soft, nontender, no hepatomegaly  Ext: 1+ lower extremity edema, left lower extremity wound bandaged and not disturbed, 2+ pedal edema Musculoskeletal:  No deformities, BUE and BLE strength weak but equal Skin: warm and dry  Neuro:  CNs 2-12 intact, no focal abnormalities noted Psych:  Normal affect   EKG:  The EKG was personally reviewed and demonstrates: Sinus rhythm, heart rate 94, PVCs Telemetry:  Telemetry was personally reviewed and demonstrates:  SR  Relevant CV Studies:  ECHO: Ordered  ECHO: 09/05/2019  1. Hypokinesis of the mid to apical inferior and inferolateral  myocardium. Left ventricular ejection fraction, by estimation, is 50 to  55%. The left ventricle has low normal function. The left ventricle has no  regional wall motion abnormalities. Left  ventricular diastolic parameters are consistent with Grade II diastolic  dysfunction (pseudonormalization). Elevated left ventricular end-diastolic  pressure.   2. Right ventricular systolic function is normal. The right ventricular  size is normal.   3. The mitral valve is normal in structure. Trivial mitral valve  regurgitation. No evidence of mitral stenosis.   4. The aortic valve was not well visualized. Aortic valve regurgitation  is trivial. No aortic stenosis is present.   5. The inferior vena cava is dilated in size with <50% respiratory  variability, suggesting right atrial pressure of 15 mmHg.   MYOVIEW: 03/31/2017 IMPRESSION  1.  Clinical response to pharmacologic stress is  normal  2.  There is a significant anterior lateral defect as described above;  unable to discern if this is ischemia versus scar given no rest images  performed.  3.       Hemodynamic response to pharmacologic stress is borderline  4.  LVEF= 46 %.   CARDIAC CATH: 05/10/2013 ANGIOGRAPHIC DATA: 1. Left main coronary artery - the left main was noted to be angiographically normal. 2. Left anterior descending coronary artery - the left anterior descending was noted to be a large caliber vessel that wrapped around the apex and was noted to be angiographically normal. 3. Left circumflex coronary artery - the circumflex was noted to be a large caliber vessel that gave rise to a large obtuse marginal branch and was noted to be angiographically normal. 4. Right coronary artery - the right coronary artery was noted to be totally occluded in the mid vessel. The distal vessel filled via the left and right collaterals.  At the completion of the diagnostic portion of the procedure, it was decided to intervene upon the right coronary artery. The patient was given Angiomax for anticoagulation. A 6 [place] JR-4 guide was used to engage the right coronary artery. A BMW Universal wire was attempted to cross  the lesion in the right coronary artery but was unsuccessful. It was exchanged for a Pilot 50 wire which was still unsuccessful to cross the lesion. We then finally exchanged for a Fielder-XT and after multiple attempts, we were still unsuccessful. At this point, it was felt the lesion was felt to be chronic total occlusion and at this point, the procedure was ended. The a final shot taken and the vessel was still noted to be totally occluded with good flow via collaterals. At this point, the procedure ended and a TR band was placed. The patient was returned back to her room where she continued to be monitored hemodynamically.  IMPRESSION: 1. chronic total occlusion of the right coronary artery  that filled via left-to-right collaterals.  RECOMMENDATIONS: 1. Will start the patient on long-acting nitroglycerin therapy. 2. If the patient continues to have chest pain despite medical management, we may refer the patient for possible chronic total occlusion intervention.   Laboratory Data:  High Sensitivity Troponin:   Recent Labs  Lab 01/31/21 1829 01/31/21 2129 02/01/21 0325  TROPONINIHS 24* 47* 81*     Chemistry Recent Labs  Lab 01/31/21 1829 02/01/21 0325  NA 138 137  K 4.1 4.3  CL 98 98  CO2 29 30  GLUCOSE 115* 168*  BUN 23 19  CREATININE 0.96 0.96  CALCIUM 9.5 9.1  GFRNONAA >60 >60  ANIONGAP 11 9    No results for input(s): PROT, ALBUMIN, AST, ALT, ALKPHOS, BILITOT in the last 168 hours. Lipids No results for input(s): CHOL, TRIG, HDL, LABVLDL, LDLCALC, CHOLHDL in the last 168 hours.  Hematology Recent Labs  Lab 01/31/21 1829 02/01/21 0325  WBC 7.8 7.6  RBC 3.67* 3.78*  HGB 11.0* 11.2*  HCT 34.4* 36.2  MCV 93.7 95.8  MCH 30.0 29.6  MCHC 32.0 30.9  RDW 16.6* 16.7*  PLT 203 208   Thyroid No results for input(s): TSH, FREET4 in the last 168 hours.  BNP Recent Labs  Lab 01/31/21 1829  BNP 113.8*    DDimer No results for input(s): DDIMER in the last 168 hours. Lab Results  Component Value Date   HGBA1C 7.2 (H) 02/01/2021   No results found for: CHOL, HDL, LDLCALC, LDLDIRECT, TRIG, CHOLHDL   Radiology/Studies:  DG Chest 2 View  Result Date: 01/31/2021 CLINICAL DATA:  Chest pain EXAM: CHEST - 2 VIEW COMPARISON:  05/03/2020 FINDINGS: Stable cardiomegaly. Aortic atherosclerosis. Streaky bibasilar opacities. No overt edema. No pleural effusion or pneumothorax. IMPRESSION: Streaky bibasilar opacities, favor atelectasis. Electronically Signed   By: Davina Poke D.O.   On: 01/31/2021 19:17   DG Pelvis 1-2 Views  Result Date: 01/31/2021 CLINICAL DATA:  Pelvic pain particularly on the left, initial encounter EXAM: PELVIS - 1 VIEW COMPARISON:   None. FINDINGS: Pelvic ring is intact. Degenerative changes of the hip joints are noted bilaterally. No acute fracture or dislocation is seen. No soft tissue abnormality is noted. IMPRESSION: Degenerative changes without acute abnormality. Electronically Signed   By: Inez Catalina M.D.   On: 01/31/2021 19:13   DG Femur Min 2 Views Left  Result Date: 01/31/2021 CLINICAL DATA:  Left hip pain, no known injury, initial encounter EXAM: LEFT FEMUR 2 VIEWS COMPARISON:  None. FINDINGS: Degenerative changes of left hip joint are noted. No acute fracture or dislocation is noted. Degenerative changes in the knee joint are seen as well. No soft tissue abnormality is noted. IMPRESSION: Nerve changes without acute abnormality. Electronically Signed   By: Linus Mako.D.  On: 01/31/2021 19:13     Assessment and Plan:   Acute on chronic diastolic CHF -Her weight a year ago was 145 kg, up to 151 kg, and now 149.7 kg - Continue to follow weight and intake/output as she diureses -Echo was ordered, follow-up on results, EF previously low normal with grade 2 DD - BUN/creatinine are normal, follow -Because of her obesity, BNP is not a good indicator of how much volume she has on board -Monitor for symptoms - Lower extremity edema is chronic, do not think we can completely resolve this - She also has a wound on her LLE, MD advise if UNNA boots would help  2.  Chest pain: - She has a chronically occluded RCA - She has occasional chest pain that is relieved by Tylenol and rest. - She does not know the name Imdur/isosorbide, so do not believe she has been on this. - She is extremely sensitive to sublingual nitroglycerin so they may have avoided it - Continue aspirin, carvedilol, amlodipine, lisinopril, and Ranexa -No recent lipid profile, will order, continue statin  Otherwise, per IM  Risk Assessment/Risk Scores:     HEAR Score (for undifferentiated chest pain):  HEAR Score: 5  New York Heart Association  (NYHA) Functional Class NYHA Class IV     For questions or updates, please contact [facility] Please consult [email or web address] for contact info under    Signed, Rosaria Ferries, PA-C  02/01/2021 1:29 PM

## 2021-02-01 NOTE — H&P (Signed)
History and Physical    [name] ONG:[id number] DOB: 06/30/1945 DOA: 01/31/2021  PCP: Lajean Manes, MD   Patient coming from: Home   Chief Complaint: Chest pain   HPI: [name] is a pleasant 76 y.o. female with medical history significant for chronic diastolic CHF, CAD, chronic hypoxic respiratory failure, OSA on CPAP, hypertension, BMI 57, insulin-dependent diabetes mellitus, pheochromocytoma status post right adrenalectomy, and psoriasis, now presenting with several days of intermittent chest pain and increased leg swelling.  Patient reports that she has been experiencing chest discomfort intermittently over the past several days that is central, dull, nonradiating, and the same as when she has had fluid overload in the past.  She denies associated nausea or diaphoresis, and does not have a pressure sensation like she did with prior MI.  She called EMS, was instructed to take 4 baby aspirin which she did, and was brought into the ED.  ED Course: Upon arrival to the ED, patient is found to be afebrile, saturating 100% on 3 L/min of supplemental oxygen, and with stable blood pressure.  EKG features sinus rhythm with PVC, PAC, nonspecific IVCD, and minimal ST depressions inferolaterally.  Chest x-ray with streaky bibasilar opacities.  Troponin was 24 and then 47.  BNP 114.  ED PA discussed with cardiology who advised against heparin.  Patient was given multiple doses of fentanyl, Robaxin, and hospitalist were asked to admit.  Review of Systems:  All other systems reviewed and apart from HPI, are negative.  Past Medical History:  Diagnosis Date   Coronary artery disease    Diabetes mellitus without complication (HCC)    Hyperlipidemia    Hypertension    OSA (obstructive sleep apnea)     Past Surgical History:  Procedure Laterality Date   CARDIAC CATHETERIZATION     pheochromocytoma     TONSILLECTOMY      Social History:   reports that she quit smoking about 31 years ago.  She has a 20.00 pack-year smoking history. She has never used smokeless tobacco. She reports that she does not currently use alcohol. She reports that she does not currently use drugs.  Allergies  Allergen Reactions   Metaxalone Anaphylaxis   Nitroglycerin     BP bottoms out   Tramadol-Acetaminophen Anaphylaxis   Icosapent Ethyl Other (See Comments)    Stomach pain, gas, diarrhea, headache from Vascepa   Brompheniramine-Pseudoeph     Feels drunk   Meprobamate    Metformin Nausea Only    "flushes"   Methyldopa     Other reaction(s): Unknown   Metoprolol     Other reaction(s): Unknown   Procaine     Passes out   Pseudoephedrine Hcl     "feel drunk"   Tramadol     "Made me go wonky, got fluid in my lungs"    Family History  Problem Relation Age of Onset   Heart attack Mother    Stroke Mother    Heart attack Father      Prior to Admission medications   Medication Sig Start Date End Date Taking? Authorizing Provider  acetaminophen (TYLENOL) 500 MG tablet Take 500 mg by mouth every 8 (eight) hours as needed for moderate pain or headache.   Yes [provider]  albuterol (VENTOLIN HFA) 108 (90 Base) MCG/ACT inhaler Inhale 2 puffs into the lungs 4 (four) times daily as needed for wheezing or shortness of breath.   Yes [provider]  amLODipine (NORVASC) 5 MG tablet Take 5 mg  by mouth daily.   Yes [provider]  aspirin EC 81 MG tablet Take 81 mg by mouth daily. Swallow whole.   Yes [provider]  BREO ELLIPTA 200-25 MCG/INH AEPB Inhale 1 puff into the lungs daily. 04/22/20  Yes [provider]  carvedilol (COREG) 25 MG tablet Take 25 mg by mouth 2 (two) times daily with a meal.   Yes [provider]  cetirizine (ZYRTEC) 10 MG tablet Take 10 mg by mouth daily.   Yes [provider]  Cholecalciferol (VITAMIN D3) 125 MCG (5000 UT) CAPS Take 5,000 Units by mouth.   Yes [provider]  diclofenac Sodium  (VOLTAREN) 1 % GEL Apply 2-4 g topically 4 (four) times daily as needed (pain).   Yes [provider]  ferrous sulfate 325 (65 FE) MG tablet Take 325 mg by mouth every Wednesday.   Yes [provider]  fluticasone (FLONASE) 50 MCG/ACT nasal spray Place 1 spray into both nostrils daily as needed for allergies or rhinitis.   Yes [provider]  glucose 4 GM chewable tablet Chew 3 tablets by mouth as needed for low blood sugar.   Yes [provider]  insulin NPH-regular Human (70-30) 100 UNIT/ML injection Inject 73 Units into the skin with breakfast, with lunch, and with evening meal.   Yes [provider]  Ixekizumab 80 MG/ML SOAJ Inject into the skin. Give subcutaneous every 4 weeks   Yes [provider]  lisinopril (ZESTRIL) 10 MG tablet Take 10 mg by mouth daily.   Yes [provider]  melatonin 5 MG TABS 1 tablet in the evening 05/01/20  Yes [provider]  meloxicam (MOBIC) 7.5 MG tablet Take 7.5 mg by mouth in the morning and at bedtime.   Yes [provider]  Multiple Vitamin (MULTIVITAMIN ADULT PO) Take 1 tablet by mouth daily.   Yes [provider]  polyethylene glycol powder (GLYCOLAX/MIRALAX) 17 GM/SCOOP powder Take 17 g by mouth daily as needed for mild constipation.   Yes [provider]  ranolazine (RANEXA) 1000 MG SR tablet Take 500 mg by mouth 2 (two) times daily.   Yes [provider]  rosuvastatin (CRESTOR) 20 MG tablet Take 20 mg by mouth at bedtime.   Yes [provider]  tiotropium (SPIRIVA HANDIHALER) 18 MCG inhalation capsule Place 18 mcg into inhaler and inhale daily.   Yes [provider]  torsemide (DEMADEX) 100 MG tablet TAKE 1/2 TABLET = 50 MG BY MOUTH ONCE DAILY Patient taking differently: Take 50 mg by mouth daily. 12/02/20  Yes O'Neal, Cassie Freer, MD  Turmeric 500 MG CAPS Take 500 mg by mouth daily.   Yes [provider]  vitamin B-12  (CYANOCOBALAMIN) 1000 MCG tablet Take 1,000 mcg by mouth daily.   Yes [provider]  acetaminophen (TYLENOL) 650 MG CR tablet Take 1,300 mg by mouth in the morning and at bedtime. Patient not taking: Reported on 01/31/2021    [provider]  azithromycin (ZITHROMAX) 250 MG tablet Take 1 tablet (250 mg total) by mouth daily. Patient not taking: Reported on 01/31/2021 05/04/20   Thurnell Lose, MD  glucose blood test strip as directed. Dispense Blood Glucose Test Strips to patient for home use. Testing 4 times daily. 01/16/19   [provider]  Insulin Syringe-Needle U-100 (INSULIN SYRINGE 1CC/31GX5/16") 31G X 5/16" 1 ML MISC USE TO INJECT INSULIN TWO TIMES A DAY 03/28/19   [provider]  Lancets 33G  MISC as directed. Testing 4 times daily.  Dispense Lancets to patient for home use. 01/16/19   [provider]  polyethylene glycol (MIRALAX / GLYCOLAX) 17 g packet Take 17 g by mouth daily as needed for mild constipation.    [provider]  Respiratory Therapy Supplies [place] as directed. BIPAP 18/14 cm H2O with humidifier, mask, tubing, and headgear. 03/14/19   [provider]    Physical Exam: Vitals:   01/31/21 1743 01/31/21 1945 01/31/21 2045 01/31/21 2200  BP:  130/68 122/61 134/74  Pulse:  87 91 88  Resp:  (!) 30 20 20   Temp: 98.5 F (36.9 C)     SpO2:  100% 100% 100%  Weight:      Height:        Constitutional: NAD, calm  Eyes: PERTLA, lids and conjunctivae normal ENMT: Mucous membranes are moist. Posterior pharynx clear of any exudate or lesions.   Neck: supple, no masses  Respiratory:  no wheezing, no crackles. No accessory muscle use.  Cardiovascular: S1 & S2 heard, regular rate and rhythm. Pretibial pitting edema bilaterally. Abdomen: No distension, no tenderness, soft. Bowel sounds active.  Musculoskeletal: no clubbing / cyanosis. No joint deformity upper and lower extremities.   Skin: left lower leg wound. Warm,  dry, well-perfused. Neurologic: CN 2-12 grossly intact. Moving all extremities. Alert and oriented.  Psychiatric: Pleasant. Cooperative.    Labs and Imaging on Admission: I have personally reviewed following labs and imaging studies  CBC: Recent Labs  Lab 01/31/21 1829  WBC 7.8  HGB 11.0*  HCT 34.4*  MCV 93.7  PLT 527   Basic Metabolic Panel: Recent Labs  Lab 01/31/21 1829  NA 138  K 4.1  CL 98  CO2 29  GLUCOSE 115*  BUN 23  CREATININE 0.96  CALCIUM 9.5   GFR: Estimated Creatinine Clearance: 76.4 mL/min (by C-G formula based on SCr of 0.96 mg/dL). Liver Function Tests: No results for input(s): AST, ALT, ALKPHOS, BILITOT, PROT, ALBUMIN in the last 168 hours. No results for input(s): LIPASE, AMYLASE in the last 168 hours. No results for input(s): AMMONIA in the last 168 hours. Coagulation Profile: No results for input(s): INR, PROTIME in the last 168 hours. Cardiac Enzymes: No results for input(s): CKTOTAL, CKMB, CKMBINDEX, TROPONINI in the last 168 hours. BNP (last 3 results) No results for input(s): PROBNP in the last 8760 hours. HbA1C: No results for input(s): HGBA1C in the last 72 hours. CBG: Recent Labs  Lab 01/31/21 2308  GLUCAP 101*   Lipid Profile: No results for input(s): CHOL, HDL, LDLCALC, TRIG, CHOLHDL, LDLDIRECT in the last 72 hours. Thyroid Function Tests: No results for input(s): TSH, T4TOTAL, FREET4, T3FREE, THYROIDAB in the last 72 hours. Anemia Panel: No results for input(s): VITAMINB12, FOLATE, FERRITIN, TIBC, IRON, RETICCTPCT in the last 72 hours. Urine analysis:    Component Value Date/Time   COLORURINE YELLOW 05/03/2020 0851   APPEARANCEUR HAZY (A) 05/03/2020 0851   LABSPEC 1.014 05/03/2020 0851   PHURINE 6.0 05/03/2020 0851   GLUCOSEU NEGATIVE 05/03/2020 0851   HGBUR NEGATIVE 05/03/2020 0851   BILIRUBINUR NEGATIVE 05/03/2020 [facility] 05/03/2020 0851   PROTEINUR NEGATIVE 05/03/2020 0851   NITRITE NEGATIVE  05/03/2020 0851   LEUKOCYTESUR NEGATIVE 05/03/2020 0851   Sepsis Labs: @LABRCNTIP (procalcitonin:4,lacticidven:4) ) Recent Results (from the past 240 hour(s))  Resp Panel by RT-PCR (Flu A&B, Covid) Nasopharyngeal Swab     Status: None   Collection Time: 01/31/21 11:02 PM   Specimen: Nasopharyngeal  Swab; Nasopharyngeal(NP) swabs in vial transport medium  Result Value Ref Range Status   SARS Coronavirus 2 by RT PCR NEGATIVE NEGATIVE Final    Comment: (NOTE) SARS-CoV-2 target nucleic acids are NOT DETECTED.  The SARS-CoV-2 RNA is generally detectable in upper respiratory specimens during the acute phase of infection. The lowest concentration of SARS-CoV-2 viral copies this assay can detect is 138 copies/mL. A negative result does not preclude SARS-Cov-2 infection and should not be used as the sole basis for treatment or other patient management decisions. A negative result may occur with  improper specimen collection/handling, submission of specimen other than nasopharyngeal swab, presence of viral mutation(s) within the areas targeted by this assay, and inadequate number of viral copies(<138 copies/mL). A negative result must be combined with clinical observations, patient history, and epidemiological information. The expected result is Negative.  Fact Sheet for Patients:  EntrepreneurPulse.com.au  Fact Sheet for Healthcare Providers:  IncredibleEmployment.be  This test is no t yet approved or cleared by the [place] FDA and  has been authorized for detection and/or diagnosis of SARS-CoV-2 by FDA under an Emergency Use Authorization (EUA). This EUA will remain  in effect (meaning this test can be used) for the duration of the COVID-19 declaration under Section 564(b)(1) of the Act, 21 U.S.C.section 360bbb-3(b)(1), unless the authorization is terminated  or revoked sooner.       Influenza A by PCR NEGATIVE NEGATIVE Final   Influenza B  by PCR NEGATIVE NEGATIVE Final    Comment: (NOTE) The Xpert Xpress SARS-CoV-2/FLU/RSV plus assay is intended as an aid in the diagnosis of influenza from Nasopharyngeal swab specimens and should not be used as a sole basis for treatment. Nasal washings and aspirates are unacceptable for Xpert Xpress SARS-CoV-2/FLU/RSV testing.  Fact Sheet for Patients: EntrepreneurPulse.com.au  Fact Sheet for Healthcare Providers: IncredibleEmployment.be  This test is not yet approved or cleared by the [place] FDA and has been authorized for detection and/or diagnosis of SARS-CoV-2 by FDA under an Emergency Use Authorization (EUA). This EUA will remain in effect (meaning this test can be used) for the duration of the COVID-19 declaration under Section 564(b)(1) of the Act, 21 U.S.C. section 360bbb-3(b)(1), unless the authorization is terminated or revoked.  Performed at [facility], [place] [address], [place], [place] [address]      Radiological Exams on Admission: DG Chest 2 View  Result Date: 01/31/2021 CLINICAL DATA:  Chest pain EXAM: CHEST - 2 VIEW COMPARISON:  05/03/2020 FINDINGS: Stable cardiomegaly. Aortic atherosclerosis. Streaky bibasilar opacities. No overt edema. No pleural effusion or pneumothorax. IMPRESSION: Streaky bibasilar opacities, favor atelectasis. Electronically Signed   By: Davina Poke D.O.   On: 01/31/2021 19:17   DG Pelvis 1-2 Views  Result Date: 01/31/2021 CLINICAL DATA:  Pelvic pain particularly on the left, initial encounter EXAM: PELVIS - 1 VIEW COMPARISON:  None. FINDINGS: Pelvic ring is intact. Degenerative changes of the hip joints are noted bilaterally. No acute fracture or dislocation is seen. No soft tissue abnormality is noted. IMPRESSION: Degenerative changes without acute abnormality. Electronically Signed   By: Inez Catalina M.D.   On: 01/31/2021 19:13   DG Femur Min 2 Views Left  Result Date:  01/31/2021 CLINICAL DATA:  Left hip pain, no known injury, initial encounter EXAM: LEFT FEMUR 2 VIEWS COMPARISON:  None. FINDINGS: Degenerative changes of left hip joint are noted. No acute fracture or dislocation is noted. Degenerative changes in the knee joint are seen as well. No soft tissue  abnormality is noted. IMPRESSION: Nerve changes without acute abnormality. Electronically Signed   By: Inez Catalina M.D.   On: 01/31/2021 19:13    EKG: Independently reviewed. Sinus rhythm, PVC, PAC, non-specific IVCD, minimal ST depressions anterolaterally.   Assessment/Plan   1. Acute on chronic diastolic CHF  - Pt with HFpEF presents with increased leg swelling and chest pain similar to when she has been hypervolemic and is found to have elevated BNP (previously normal)   - Diurese with IV Lasix, continue Coreg and lisinopril, monitor wt and I/Os, update echo    2. Chest pain  - Pt with hx of CAD s/p angioplasty in 1992 and LHC at [facility] in 2015 with total occlusion of RCA with collaterals presents with chest pain  - She reports the pain is similar to when she has had volume o/l in the past and unlike her prior angina  - Troponin is 24 then 47 in ED; ED PA spoke with cardiology who advised against IV heparin  - Continue ASA, statin, Ranexa, and beta-blocker; trend troponin, and follow-up echo  3. Chronic hypoxic respiratory failure; OSA  - Uses 3 Lpm supplemental O2 since suffering from COVID two yrs ago, also uses CPAP at home  - Appears stable, continue supplemental O2 and CPAP qHS   4. IDDM  - A1c was 7.7% in April 2022  - Continue CBG checks and insulin    5. Left leg wound  - Chronic wound involving lower Lt leg posteriorly, does not appear infected  - Continue wound care     DVT prophylaxis: Lovenox   Code Status: Full  Level of Care: Level of care: Telemetry Cardiac Family Communication: none present  Disposition Plan:  Patient is from: home  Anticipated d/c is to: TBD Anticipated  d/c date is: 02/03/21  Patient currently: Pending echo, repeat troponin, diuresis  Consults called: none  Admission status: Observation     Vianne Bulls, MD [facility]  02/01/2021, 1:21 AM

## 2021-02-01 NOTE — ED Notes (Signed)
Patient unable to stand long enough to get in wheelchair.  Patient has continuous bladder leakage and continues to pee on self and floor.  Patient is going to have to be transport to unit with recliner at this time

## 2021-02-02 ENCOUNTER — Inpatient Hospital Stay (HOSPITAL_COMMUNITY): Payer: Medicare Other

## 2021-02-02 DIAGNOSIS — D649 Anemia, unspecified: Secondary | ICD-10-CM

## 2021-02-02 DIAGNOSIS — I5033 Acute on chronic diastolic (congestive) heart failure: Secondary | ICD-10-CM

## 2021-02-02 DIAGNOSIS — J9611 Chronic respiratory failure with hypoxia: Secondary | ICD-10-CM | POA: Diagnosis not present

## 2021-02-02 DIAGNOSIS — I2510 Atherosclerotic heart disease of native coronary artery without angina pectoris: Secondary | ICD-10-CM | POA: Diagnosis not present

## 2021-02-02 DIAGNOSIS — R079 Chest pain, unspecified: Secondary | ICD-10-CM | POA: Diagnosis not present

## 2021-02-02 LAB — COMPREHENSIVE METABOLIC PANEL
ALT: 18 U/L (ref 0–44)
AST: 20 U/L (ref 15–41)
Albumin: 3.4 g/dL — ABNORMAL LOW (ref 3.5–5.0)
Alkaline Phosphatase: 44 U/L (ref 38–126)
Anion gap: 13 (ref 5–15)
BUN: 29 mg/dL — ABNORMAL HIGH (ref 8–23)
CO2: 28 mmol/L (ref 22–32)
Calcium: 9.9 mg/dL (ref 8.9–10.3)
Chloride: 97 mmol/L — ABNORMAL LOW (ref 98–111)
Creatinine, Ser: 1.24 mg/dL — ABNORMAL HIGH (ref 0.44–1.00)
GFR, Estimated: 46 mL/min — ABNORMAL LOW (ref >60)
Glucose, Bld: 131 mg/dL — ABNORMAL HIGH (ref 70–99)
Potassium: 4.3 mmol/L (ref 3.5–5.1)
Sodium: 138 mmol/L (ref 135–145)
Total Bilirubin: 0.7 mg/dL (ref 0.3–1.2)
Total Protein: 7.1 g/dL (ref 6.5–8.1)

## 2021-02-02 LAB — PHOSPHORUS: Phosphorus: 5 mg/dL — ABNORMAL HIGH (ref 2.5–4.6)

## 2021-02-02 LAB — GLUCOSE, CAPILLARY
Glucose-Capillary: 163 mg/dL — ABNORMAL HIGH (ref 70–99)
Glucose-Capillary: 231 mg/dL — ABNORMAL HIGH (ref 70–99)
Glucose-Capillary: 164 mg/dL — ABNORMAL HIGH (ref 70–99)
Glucose-Capillary: 232 mg/dL — ABNORMAL HIGH (ref 70–99)

## 2021-02-02 LAB — CBC WITH DIFFERENTIAL/PLATELET
Abs Immature Granulocytes: 0.14 10*3/uL — ABNORMAL HIGH (ref 0.00–0.07)
Basophils Absolute: 0 10*3/uL (ref 0.0–0.1)
Basophils Relative: 1 %
Eosinophils Absolute: 0.2 10*3/uL (ref 0.0–0.5)
Eosinophils Relative: 2 %
HCT: 33.2 % — ABNORMAL LOW (ref 36.0–46.0)
Hemoglobin: 10.3 g/dL — ABNORMAL LOW (ref 12.0–15.0)
Immature Granulocytes: 2 %
Lymphocytes Relative: 10 %
Lymphs Abs: 0.9 10*3/uL (ref 0.7–4.0)
MCH: 29.1 pg (ref 26.0–34.0)
MCHC: 31 g/dL (ref 30.0–36.0)
MCV: 93.8 fL (ref 80.0–100.0)
Monocytes Absolute: 0.7 10*3/uL (ref 0.1–1.0)
Monocytes Relative: 9 %
Neutro Abs: 6.7 10*3/uL (ref 1.7–7.7)
Neutrophils Relative %: 76 %
Platelets: 204 10*3/uL (ref 150–400)
RBC: 3.54 MIL/uL — ABNORMAL LOW (ref 3.87–5.11)
RDW: 16.7 % — ABNORMAL HIGH (ref 11.5–15.5)
WBC: 8.7 10*3/uL (ref 4.0–10.5)
nRBC: 0.2 % (ref 0.0–0.2)

## 2021-02-02 LAB — ECHOCARDIOGRAM COMPLETE
AR max vel: 1.79 cm2
AV Peak grad: 18.3 mmHg
Ao pk vel: 2.14 m/s
Area-P 1/2: 4.99 cm2
Height: 64 in
Weight: 5315.2 oz

## 2021-02-02 LAB — MAGNESIUM: Magnesium: 1.8 mg/dL (ref 1.7–2.4)

## 2021-02-02 MED ORDER — KETOROLAC TROMETHAMINE 15 MG/ML IJ SOLN
15.0000 mg | Freq: Once | INTRAMUSCULAR | Status: AC
  Administered 2021-02-02: 15 mg via INTRAVENOUS
  Filled 2021-02-02: qty 1

## 2021-02-02 MED ORDER — PERFLUTREN LIPID MICROSPHERE
1.0000 mL | INTRAVENOUS | Status: DC | PRN
  Administered 2021-02-02: 4 mL via INTRAVENOUS
  Filled 2021-02-02: qty 10

## 2021-02-02 NOTE — Progress Notes (Signed)
Because of PROGRESS NOTE    [name]  JSE:[id number] DOB: 05/22/1945 DOA: 01/31/2021 PCP: [name], NP   Brief Narrative:  The patient is a 76 year old morbidly obese Caucasian female with a past medical history significant for but not limited to chronic diastolic CHF, CAD, chronic hypoxic respiratory failure on 3 L of supplemental oxygen via nasal cannula, OSA on CPAP, hypertension, insulin-dependent diabetes mellitus type 2, history of pheochromocytoma status post right adrenalectomy and psoriasis who presented with several days of intermittent chest pain and increased leg swelling.  She reported that she been experiencing chest discomfort intermittently over the last several days that is a central dull and nonradiating and this is this happens when she is fluid overload in the past.  She denies any nausea, diaphoresis and did not have a pressure sensation as when she did when she had a prior MI.  She called EMS instructed to take 4 baby aspirin and was brought to the ED.  Upon arrival to the ED she is afebrile and saturating 100% on 3 L submental oxygen via nasal cannula.  She has minimal ST depressions inferior laterally and chest x-ray showed some streaky bibasilar opacities.  Troponin level initially was 24 and then trended up 47 and then finally 81.  BNP was 114.  ED discussed with cardiology who advised against heparin at this time.  She was admitted and is now being diuresed and cardiology is following and holding off further IV diuresis given her bump in creatinine.  Patient's breathing is better but she is now complaining of some significant pain in the hips.  Assessment & Plan:   Principal Problem:   Acute on chronic diastolic CHF (congestive heart failure) (HCC) Active Problems:   CAD (coronary artery disease), native coronary artery   Chest pain   Essential hypertension   OSA on CPAP   DM2 (diabetes mellitus, type 2) (HCC)   Chronic respiratory failure with hypoxia and  hypercapnia (HCC)   Leg wound, left  Acute on Chronic Diastolic CHF  - Pt with HFpEF presents with increased leg swelling and chest pain similar to when she has been hypervolemic and is found to have elevated BNP at 113.8 (previously normal)   -Diuresis with IV Lasix and now been held -Continue Coreg and lisinopril, monitor wt and I/Os, - -Cardiology was consulted for further evaluation recommendations and initially recommended continuing diuresis with IV Lasix and transition to oral diuretics soon but now cardiology is holding further IV diuresis given her bump in creatinine as patient's BUN/creatinine went from 19/0.96 and now 29/1.24 -Repeat echocardiogram done and a poor acoustic window limit the study with an overall LVEF appearing vigorous   Chest pain, improved -Pt with hx of CAD s/p angioplasty in 1992 and LHC at [facility] in 2015 with total occlusion of RCA with collaterals presents with chest pain  -She reports the pain is similar to when she has had volume o/l in the past and unlike her prior angina  -Troponin is 24 then 47 in ED; ED PA spoke with cardiology who advised against IV heparin  -Troponin trended up to 81  -Continue ASA, statin, Ranexa, and beta-blocker; trend troponin, and follow-up echo -Cardiology consulted for further evaluation and evaluations; she is extremely sensitive sublingual nitroglycerin this is being avoided; she has no recent lipid profile so they are going to order   Chronic Hypoxic respiratory failure; OSA  -Uses 3 Lpm supplemental O2 since suffering from COVID two yrs ago, also uses CPAP at home  -  SpO2: 94 % O2 Flow Rate (L/min): 3 L/min FiO2 (%): 32 % -Appears stable, continue supplemental O2 and CPAP qHS  -Repeat CXR showed "Cardiomegaly with pulmonary vascular prominence, however without overt edema. No new or focal airspace opacity."  -Repeat chest x-ray in a.m. and will need an Ambulatory home O2 screen prior to discharge -PT/OT recommending  SNF  Left Leg Wound -WOC Nurse consulted  -Chronic wound involving lower Lt leg posteriorly, does not appear infected    IDDM  -A1c was 7.7% in April 2022 and repeat was 7.2 this Admission -Continue CBG checks and continue with moderate NovoLog sliding scale insulin AC/HS -Continue with Semglee 15 units twice daily and insulin Aspart 6 Units 3 Times Daily with meals  -CBGs ranging from 160-231  Normocytic Anemia -Patient's Hgb/Hct went from 11.0/34.4 -> 11.2/36.2 -> 10.3/33.2 -Check Anemia Panel in the AM -Continue to Monitor for S/Sx of Bleeding; No overt bleeding noted -Repeat CBC in the AM  Left Side Hip pain -DG Pelvis showed "Degenerative changes without acute abnormality." -DG Femur showed "Degenerative changes of left hip joint are noted. No acute fracture or dislocation is noted. Degenerative changes in the knee joint are seen as well. No soft tissue abnormality is noted." -C/w Fentanyl 12.5-50 mcg IV q2hprn Severe Pain -Will give a 1x dose of IV 15 mg of Ketorolac and C/w Methocarbamol 500 mg IV q6hprn Muscle Spasms   Morbid Obesity -Complicates overall prognosis and care -Estimated body mass index is 57.02 kg/m as calculated from the following:   Height as of this encounter: 5\' 4"  (1.626 m).   Weight as of this encounter: 150.7 kg.  -Weight Loss and Dietary Counseling given  DVT prophylaxis: Enoxaparin 40 mg sq q24h Code Status: FULL CODE  Family Communication: No family present at bedside  Disposition Plan: Pending further clinical improvement and evaluation by PT/OT  Status is: Observation  The patient will require care spanning > 2 midnights and should be moved to inpatient because: Still getting IV Diuresis and needs Cardiology Clearance   Consultants:  Cardiology  Procedures:  ECHOCARDIOGRAM IMPRESSIONS     1. Very limited study with poor echo windows. Definity contrast was  administered.   2. Left ventricular ejection fraction, by estimation, is  >75%. The left  ventricle has hyperdynamic function. The left ventricle has no regional  wall motion abnormalities. Left ventricular diastolic function could not  be evaluated.   3. Right ventricular systolic function was not well visualized. The right  ventricular size is not well visualized.   4. The mitral valve was not well visualized. No evidence of mitral valve  regurgitation.   5. The aortic valve was not well visualized. Aortic valve regurgitation  is not visualized.   6. The inferior vena cava is normal in size with greater than 50%  respiratory variability, suggesting right atrial pressure of 3 mmHg.   Comparison(s): Changes from prior study are noted. 09/05/2019: LVEF 50-55%.   FINDINGS   Left Ventricle: Left ventricular ejection fraction, by estimation, is  >75%. The left ventricle has hyperdynamic function. The left ventricle has  no regional wall motion abnormalities. Definity contrast agent was given  IV to delineate the left  ventricular endocardial borders. The left ventricular internal cavity size  was normal in size. Suboptimal image quality limits for assessment of left  ventricular hypertrophy. Left ventricular diastolic function could not be  evaluated.   Right Ventricle: The right ventricular size is not well visualized. Right  vetricular wall thickness was  not well visualized. Right ventricular  systolic function was not well visualized.   Left Atrium: Left atrial size was not well visualized.   Right Atrium: Right atrial size was not well visualized.   Pericardium: There is no evidence of pericardial effusion.   Mitral Valve: The mitral valve was not well visualized. No evidence of  mitral valve regurgitation.   Tricuspid Valve: The tricuspid valve is not well visualized. Tricuspid  valve regurgitation is not demonstrated.   Aortic Valve: The aortic valve was not well visualized. Aortic valve  regurgitation is not visualized. Aortic valve peak  gradient measures 18.3  mmHg.   Pulmonic Valve: The pulmonic valve was not well visualized. Pulmonic valve  regurgitation is not visualized.   Aorta: The aortic root and ascending aorta are structurally normal, with  no evidence of dilitation.   Venous: The inferior vena cava is normal in size with greater than 50%  respiratory variability, suggesting right atrial pressure of 3 mmHg.   IAS/Shunts: No atrial level shunt detected by color flow Doppler.      LEFT VENTRICLE  PLAX 2D  LVOT diam:     2.00 cm  LV SV:         69  LV SV Index:   28  LVOT Area:     3.14 cm      IVC  IVC diam: 2.00 cm   AORTIC VALVE                 PULMONIC VALVE  AV Area (Vmax): 1.79 cm     PV Vmax:       0.88 m/s  AV Vmax:        214.00 cm/s  PV Peak grad:  3.1 mmHg  AV Peak Grad:   18.3 mmHg  LVOT Vmax:      122.00 cm/s  LVOT Vmean:     73.000 cm/s  LVOT VTI:       0.219 m     AORTA  Ao Root diam: 3.30 cm  Ao Asc diam:  3.00 cm   MITRAL VALVE  MV Area (PHT): 4.99 cm    SHUNTS  MV Decel Time: 152 msec    Systemic VTI:  0.22 m  MV E velocity: 88.80 cm/s  Systemic Diam: 2.00 cm  MV A velocity: 57.30 cm/s  MV E/A ratio:  1.55   Antimicrobials:  Anti-infectives (From admission, onward)    None        Subjective: Seen and examined at bedside and thinks that her breathing is doing better and that her legs are not as swollen but she continues to complain of significant hip pain.  States that sitting in the chair is that her hip pain to flare as well as her sciatic pain in the right side.  No nausea or vomiting.  No other concerns or complaints this time.  Objective: Vitals:   02/01/21 2013 02/02/21 0500 02/02/21 0850 02/02/21 1103  BP: (!) 108/53 107/67  117/67  Pulse: 87 84 86 70  Resp: 20 (!) 21 20 20   Temp: 98.2 F (36.8 C) 98.1 F (36.7 C)  98.1 F (36.7 C)  TempSrc: Oral Oral  Oral  SpO2: 100% 99% 95% 94%  Weight:  (!) 150.7 kg    Height:        Intake/Output Summary  (Last 24 hours) at 02/02/2021 1725 Last data filed at 02/01/2021 2234 Gross per 24 hour  Intake --  Output 500 ml  Net -500 ml  Filed Weights   01/31/21 1739 02/02/21 0500  Weight: (!) 149.7 kg (!) 150.7 kg   Examination: Physical Exam:  Constitutional: WN/WD super morbidly obese Caucasian female in NAD and appears calm but a little uncomfortable sitting in the chair at bedside Eyes: Lids and conjunctivae normal, sclerae anicteric  ENMT: External Ears, Nose appear normal. Grossly normal hearing. Mucous membranes are moist. Neck: Appears normal, supple, no cervical masses, normal ROM, no appreciable thyromegaly; difficult to assess given JVD Respiratory: Diminished to auscultation bilaterally with coarse breath sounds, no wheezing, rales, rhonchi or crackles. Normal respiratory effort and patient is not tachypenic. No accessory muscle use. Unlabored breathing but wearing of Supplemental O2 via [place] Cardiovascular: RRR, no murmurs / rubs / gallops. S1 and S2 auscultated. 1+ LE extremity edema. Abdomen: Soft, non-tender, Distended 2/2 body habitus. Bowel sounds positive.  GU: Deferred. Musculoskeletal: No clubbing / cyanosis of digits/nails. No joint deformity upper and lower extremities.  Skin: Left Leg wound covered.  Neurologic: CN 2-12 grossly intact with no focal deficits. Romberg sign and cerebellar reflexes not assessed.  Psychiatric: Normal judgment and insight. Alert and oriented x 3. Normal mood and appropriate affect.   Data Reviewed: I have personally reviewed following labs and imaging studies  CBC: Recent Labs  Lab 01/31/21 1829 02/01/21 0325 02/02/21 0124  WBC 7.8 7.6 8.7  NEUTROABS  --   --  6.7  HGB 11.0* 11.2* 10.3*  HCT 34.4* 36.2 33.2*  MCV 93.7 95.8 93.8  PLT 203 208 774    Basic Metabolic Panel: Recent Labs  Lab 01/31/21 1829 02/01/21 0325 02/02/21 0124  NA 138 137 138  K 4.1 4.3 4.3  CL 98 98 97*  CO2 29 30 28   GLUCOSE 115* 168* 131*  BUN 23 19  29*  CREATININE 0.96 0.96 1.24*  CALCIUM 9.5 9.1 9.9  MG  --   --  1.8  PHOS  --   --  5.0*    GFR: Estimated Creatinine Clearance: 59.4 mL/min (A) (by C-G formula based on SCr of 1.24 mg/dL (H)). Liver Function Tests: Recent Labs  Lab 02/02/21 0124  AST 20  ALT 18  ALKPHOS 44  BILITOT 0.7  PROT 7.1  ALBUMIN 3.4*   No results for input(s): LIPASE, AMYLASE in the last 168 hours. No results for input(s): AMMONIA in the last 168 hours. Coagulation Profile: No results for input(s): INR, PROTIME in the last 168 hours. Cardiac Enzymes: No results for input(s): CKTOTAL, CKMB, CKMBINDEX, TROPONINI in the last 168 hours. BNP (last 3 results) No results for input(s): PROBNP in the last 8760 hours. HbA1C: Recent Labs    02/01/21 0325  HGBA1C 7.2*    CBG: Recent Labs  Lab 02/01/21 1646 02/01/21 2242 02/02/21 0734 02/02/21 1100 02/02/21 1625  GLUCAP 176* 160* 163* 231* 164*    Lipid Profile: Recent Labs    02/01/21 0325  CHOL 145  HDL 40*  LDLCALC 38  TRIG 337*  CHOLHDL 3.6   Thyroid Function Tests: No results for input(s): TSH, T4TOTAL, FREET4, T3FREE, THYROIDAB in the last 72 hours. Anemia Panel: No results for input(s): VITAMINB12, FOLATE, FERRITIN, TIBC, IRON, RETICCTPCT in the last 72 hours. Sepsis Labs: No results for input(s): PROCALCITON, LATICACIDVEN in the last 168 hours.  Recent Results (from the past 240 hour(s))  Resp Panel by RT-PCR (Flu A&B, Covid) Nasopharyngeal Swab     Status: None   Collection Time: 01/31/21 11:02 PM   Specimen: Nasopharyngeal Swab; Nasopharyngeal(NP) swabs in vial transport medium  Result Value Ref Range Status   SARS Coronavirus 2 by RT PCR NEGATIVE NEGATIVE Final    Comment: (NOTE) SARS-CoV-2 target nucleic acids are NOT DETECTED.  The SARS-CoV-2 RNA is generally detectable in upper respiratory specimens during the acute phase of infection. The lowest concentration of SARS-CoV-2 viral copies this assay can detect  is 138 copies/mL. A negative result does not preclude SARS-Cov-2 infection and should not be used as the sole basis for treatment or other patient management decisions. A negative result may occur with  improper specimen collection/handling, submission of specimen other than nasopharyngeal swab, presence of viral mutation(s) within the areas targeted by this assay, and inadequate number of viral copies(<138 copies/mL). A negative result must be combined with clinical observations, patient history, and epidemiological information. The expected result is Negative.  Fact Sheet for Patients:  EntrepreneurPulse.com.au  Fact Sheet for Healthcare Providers:  IncredibleEmployment.be  This test is no t yet approved or cleared by the [place] FDA and  has been authorized for detection and/or diagnosis of SARS-CoV-2 by FDA under an Emergency Use Authorization (EUA). This EUA will remain  in effect (meaning this test can be used) for the duration of the COVID-19 declaration under Section 564(b)(1) of the Act, 21 U.S.C.section 360bbb-3(b)(1), unless the authorization is terminated  or revoked sooner.       Influenza A by PCR NEGATIVE NEGATIVE Final   Influenza B by PCR NEGATIVE NEGATIVE Final    Comment: (NOTE) The Xpert Xpress SARS-CoV-2/FLU/RSV plus assay is intended as an aid in the diagnosis of influenza from Nasopharyngeal swab specimens and should not be used as a sole basis for treatment. Nasal washings and aspirates are unacceptable for Xpert Xpress SARS-CoV-2/FLU/RSV testing.  Fact Sheet for Patients: EntrepreneurPulse.com.au  Fact Sheet for Healthcare Providers: IncredibleEmployment.be  This test is not yet approved or cleared by the [place] FDA and has been authorized for detection and/or diagnosis of SARS-CoV-2 by FDA under an Emergency Use Authorization (EUA). This EUA will remain in effect  (meaning this test can be used) for the duration of the COVID-19 declaration under Section 564(b)(1) of the Act, 21 U.S.C. section 360bbb-3(b)(1), unless the authorization is terminated or revoked.  Performed at [facility], [name] [address] [place], [place] [address]      RN Pressure Injury Documentation:     Estimated body mass index is 57.02 kg/m as calculated from the following:   Height as of this encounter: 5\' 4"  (1.626 m).   Weight as of this encounter: 150.7 kg.  Malnutrition Type:   Malnutrition Characteristics:   Nutrition Interventions:    Radiology Studies: DG Chest 2 View  Result Date: 01/31/2021 CLINICAL DATA:  Chest pain EXAM: CHEST - 2 VIEW COMPARISON:  05/03/2020 FINDINGS: Stable cardiomegaly. Aortic atherosclerosis. Streaky bibasilar opacities. No overt edema. No pleural effusion or pneumothorax. IMPRESSION: Streaky bibasilar opacities, favor atelectasis. Electronically Signed   By: Davina Poke D.O.   On: 01/31/2021 19:17   DG Pelvis 1-2 Views  Result Date: 01/31/2021 CLINICAL DATA:  Pelvic pain particularly on the left, initial encounter EXAM: PELVIS - 1 VIEW COMPARISON:  None. FINDINGS: Pelvic ring is intact. Degenerative changes of the hip joints are noted bilaterally. No acute fracture or dislocation is seen. No soft tissue abnormality is noted. IMPRESSION: Degenerative changes without acute abnormality. Electronically Signed   By: Inez Catalina M.D.   On: 01/31/2021 19:13   DG CHEST PORT 1 VIEW  Result Date: 02/02/2021 CLINICAL DATA:  Shortness of  breath EXAM: PORTABLE CHEST 1 VIEW COMPARISON:  01/31/2021 FINDINGS: Unchanged AP portable examination. Cardiomegaly with pulmonary vascular prominence, however without overt edema. No new or focal airspace opacity. IMPRESSION: Cardiomegaly with pulmonary vascular prominence, however without overt edema. No new or focal airspace opacity. Electronically Signed   By: Delanna Ahmadi M.D.   On: 02/02/2021 08:51    ECHOCARDIOGRAM COMPLETE  Result Date: 02/02/2021    ECHOCARDIOGRAM REPORT   Patient Name:   [name] Date of Exam: 02/02/2021 Medical Rec #:  [id number]     Height:       64.0 in Accession #:    [id number]    Weight:       332.2 lb Date of Birth:  1945-10-19     BSA:          2.427 m Patient Age:    60 years      BP:           107/67 mmHg Patient Gender: F             HR:           85 bpm. Exam Location:  Inpatient Procedure: 2D Echo Indications:    CHF  History:        Patient has prior history of Echocardiogram examinations, most                 recent 09/05/2019. CHF; Risk Factors:Hypertension and Diabetes.  Sonographer:    Jefferey Pica Referring Phys: [id number] TIMOTHY S OPYD  Sonographer Comments: Technically difficult study due to poor echo windows and patient is morbidly obese. Unable to aquire all images due to body habitus and patients level of pain. IMPRESSIONS  1. Very limited study with poor echo windows. Definity contrast was administered.  2. Left ventricular ejection fraction, by estimation, is >75%. The left ventricle has hyperdynamic function. The left ventricle has no regional wall motion abnormalities. Left ventricular diastolic function could not be evaluated.  3. Right ventricular systolic function was not well visualized. The right ventricular size is not well visualized.  4. The mitral valve was not well visualized. No evidence of mitral valve regurgitation.  5. The aortic valve was not well visualized. Aortic valve regurgitation is not visualized.  6. The inferior vena cava is normal in size with greater than 50% respiratory variability, suggesting right atrial pressure of 3 mmHg. Comparison(s): Changes from prior study are noted. 09/05/2019: LVEF 50-55%. FINDINGS  Left Ventricle: Left ventricular ejection fraction, by estimation, is >75%. The left ventricle has hyperdynamic function. The left ventricle has no regional wall motion abnormalities. Definity contrast agent was given IV  to delineate the left ventricular endocardial borders. The left ventricular internal cavity size was normal in size. Suboptimal image quality limits for assessment of left ventricular hypertrophy. Left ventricular diastolic function could not be evaluated. Right Ventricle: The right ventricular size is not well visualized. Right vetricular wall thickness was not well visualized. Right ventricular systolic function was not well visualized. Left Atrium: Left atrial size was not well visualized. Right Atrium: Right atrial size was not well visualized. Pericardium: There is no evidence of pericardial effusion. Mitral Valve: The mitral valve was not well visualized. No evidence of mitral valve regurgitation. Tricuspid Valve: The tricuspid valve is not well visualized. Tricuspid valve regurgitation is not demonstrated. Aortic Valve: The aortic valve was not well visualized. Aortic valve regurgitation is not visualized. Aortic valve peak gradient measures 18.3 mmHg. Pulmonic Valve: The pulmonic valve was not well visualized.  Pulmonic valve regurgitation is not visualized. Aorta: The aortic root and ascending aorta are structurally normal, with no evidence of dilitation. Venous: The inferior vena cava is normal in size with greater than 50% respiratory variability, suggesting right atrial pressure of 3 mmHg. IAS/Shunts: No atrial level shunt detected by color flow Doppler.  LEFT VENTRICLE PLAX 2D LVOT diam:     2.00 cm LV SV:         69 LV SV Index:   28 LVOT Area:     3.14 cm  IVC IVC diam: 2.00 cm AORTIC VALVE                 PULMONIC VALVE AV Area (Vmax): 1.79 cm     PV Vmax:       0.88 m/s AV Vmax:        214.00 cm/s  PV Peak grad:  3.1 mmHg AV Peak Grad:   18.3 mmHg LVOT Vmax:      122.00 cm/s LVOT Vmean:     73.000 cm/s LVOT VTI:       0.219 m  AORTA Ao Root diam: 3.30 cm Ao Asc diam:  3.00 cm MITRAL VALVE MV Area (PHT): 4.99 cm    SHUNTS MV Decel Time: 152 msec    Systemic VTI:  0.22 m MV E velocity: 88.80 cm/s   Systemic Diam: 2.00 cm MV A velocity: 57.30 cm/s MV E/A ratio:  1.55 Lyman Bishop MD Electronically signed by Lyman Bishop MD Signature Date/Time: 02/02/2021/11:25:09 AM    Final    DG Femur Min 2 Views Left  Result Date: 01/31/2021 CLINICAL DATA:  Left hip pain, no known injury, initial encounter EXAM: LEFT FEMUR 2 VIEWS COMPARISON:  None. FINDINGS: Degenerative changes of left hip joint are noted. No acute fracture or dislocation is noted. Degenerative changes in the knee joint are seen as well. No soft tissue abnormality is noted. IMPRESSION: Nerve changes without acute abnormality. Electronically Signed   By: Inez Catalina M.D.   On: 01/31/2021 19:13    Scheduled Meds:  amLODipine  5 mg Oral Daily   aspirin EC  81 mg Oral Daily   carvedilol  25 mg Oral BID WC   enoxaparin (LOVENOX) injection  40 mg Subcutaneous Daily   fluticasone furoate-vilanterol  1 puff Inhalation Daily   insulin aspart  0-15 Units Subcutaneous TID WC   insulin aspart  0-5 Units Subcutaneous QHS   insulin aspart  6 Units Subcutaneous TID WC   insulin glargine-yfgn  50 Units Subcutaneous BID   lisinopril  10 mg Oral Daily   melatonin  5 mg Oral QHS   ranolazine  500 mg Oral BID   rosuvastatin  20 mg Oral QHS   sodium chloride flush  3 mL Intravenous Q12H   umeclidinium bromide  1 puff Inhalation Daily   Continuous Infusions:  methocarbamol (ROBAXIN) IV 500 mg (02/01/21 2257)    LOS: 1 day   Kerney Elbe, DO [facility] PAGER is on AMION  If 7PM-7AM, please contact night-coverage [email or web address]

## 2021-02-02 NOTE — TOC Initial Note (Signed)
Transition of Care [facility]) - Initial/Assessment Note    Patient Details  Name: Denece Shearer MRN: [id number] Date of Birth: [birth year]  [facility]) CM/SW Contact:    Milas Gain, [place] Phone Number: 02/02/2021, 3:56 PM  Clinical Narrative:                  CSW received consult for possible SNF placement at time of discharge. CSW spoke with patient regarding PT recommendation of SNF placement at time of discharge. Patient reports she comes from [facility].Patient expressed understanding of PT recommendation and is agreeable to SNF placement at time of discharge. Patient reports preference for [facility] . CSW discussed insurance authorization process with patient. Patient has received the COVID vaccines as well as 2 boosters. CSW spoke with Florentina Jenny with [facility] who confirmed they can accept patient tomorrow for SNF placement if medically ready for dc.No further questions reported at this time. CSW to continue to follow and assist with discharge planning needs.   Expected Discharge Plan: Skilled Nursing Facility Barriers to Discharge: Continued Medical Work up   Patient Goals and CMS Choice Patient states their goals for this hospitalization and ongoing recovery are:: SNF CMS Medicare.gov Compare Post Acute Care list provided to:: Patient Choice offered to / list presented to : Patient  Expected Discharge Plan and Services Expected Discharge Plan: [place] In-house Referral: Clinical Social Work     Living arrangements for the past 2 months: [place]                                      Prior Living Arrangements/Services Living arrangements for the past 2 months: [place] Lives with:: Self (From [place] living) Patient language and need for interpreter reviewed:: Yes Do you feel safe going back to the place where you live?: No   SNF  Need for Family Participation in  Patient Care: Yes (Comment)     Criminal Activity/Legal Involvement Pertinent to Current Situation/Hospitalization: No - Comment as needed  Activities of Daily Living      Permission Sought/Granted Permission sought to share information with : Case Manager, Family Supports, Customer service manager Permission granted to share information with : Yes, Verbal Permission Granted  Share Information with NAME: Jerline Pain  Permission granted to share info w AGENCY: SNF  Permission granted to share info w Relationship: son  Permission granted to share info w Contact Information: Jerline Pain [phone]  Emotional Assessment Appearance:: Appears stated age Attitude/Demeanor/Rapport: Gracious Affect (typically observed): Calm Orientation: : Oriented to Self, Oriented to Place, Oriented to  Time, Oriented to Situation (WDL) Alcohol / Substance Use: Not Applicable Psych Involvement: No (comment)  Admission diagnosis:  SOB (shortness of breath) [R06.02] Pain [R52] Chest pain [R07.9] Chest pain, unspecified type [R07.9] Patient Active Problem List   Diagnosis Date Noted   Leg wound, left 02/01/2021   SIRS (systemic inflammatory response syndrome) ([place]) 05/03/2020   DM2 (diabetes mellitus, type 2) ([place]) 05/03/2020   Chronic respiratory failure with hypoxia and hypercapnia ([place]) 05/03/2020   Cervical disc prolapse with radiculopathy 12/07/2019   History of pheochromocytoma 12/07/2019   Non-comitant strabismus 12/07/2019   OSA on CPAP 12/07/2019   Primary osteoarthritis involving multiple joints 06/20/2019   Gait abnormality 06/19/2019   Psoriasis with arthropathy ([place]) 04/06/2017   TIA (transient ischemic attack) 03/22/2017   Acute on chronic diastolic CHF (congestive heart failure) ([place]) 03/09/2017  Diabetic hypoglycemia ([place]) 03/05/2014   Diabetic neuropathic arthropathy ([place]) 03/05/2014   Dizzy 12/28/2011   CAD (coronary artery disease), native coronary artery 12/05/2010   Morbid  obesity ([place]) 12/05/2010   Essential hypertension 12/03/2010   HLD (hyperlipidemia) 12/03/2010   UI (urinary incontinence) 12/03/2010   Chest pain 05/31/2006   PCP:  [name], NP Pharmacy:   [place], [place] - [address] [place] [address] Phone: [phone] Fax: [phone]     Social Determinants of Health (SDOH) Interventions    Readmission Risk Interventions No flowsheet data found.

## 2021-02-02 NOTE — Evaluation (Signed)
Occupational Therapy Evaluation Patient Details Name: [name] MRN: [id number] DOB: [birth year] Today's Date: 02/02/2021   History of Present Illness 76 yo admitted 1/14 with SOB and edema and CHFexacerbation. PMhx: covid 19 2020 with respiratory failure on 3L at home, HFpEF, DM, HTN, OSA on CPAP, morbid obesity, psoriasis   Clinical Impression   Prior to admission patient was living in [place] at [facility] receiving assistance with her feet two times a week and having OT services one time a week. Patient currently with deficits in functional mobility, ADLs and overall independence limiting ability return to ILF. Patient's limiting factor during session was pain in L hip, allowing patient to complete only pivotal steps to [facility] with +2 min assist for safety. Patient also with difficulty reaching lower extremities and completing peri-care in session. Patient would benefit from short term rehabilitation at SNF in order to regain prior level of function. Occupational Therapy will follow acutely in order to address functional deficits listed below.    Recommendations for follow up therapy are one component of a multi-disciplinary discharge planning process, led by the attending physician.  Recommendations may be updated based on patient status, additional functional criteria and insurance authorization.   Follow Up Recommendations  Skilled nursing-short term rehab (<3 hours/day)    Assistance Recommended at Discharge Intermittent Supervision/Assistance  Patient can return home with the following A lot of help with walking and/or transfers;A lot of help with bathing/dressing/bathroom;Assistance with cooking/housework;Assist for transportation;Help with stairs or ramp for entrance    Functional Status Assessment  Patient has had a recent decline in their functional status and demonstrates the ability to make significant improvements in function in a reasonable and predictable amount of time.   Equipment Recommendations  None recommended by OT    Recommendations for Other Services       Precautions / Restrictions Precautions Precautions: Fall;Other (comment) (LLE wound) Precaution Comments: urinary incontinence. PT reports pain left hip and left shoulder      Mobility Bed Mobility     Supine to sit: Min assist;HOB elevated     General bed mobility comments: OOB in chair    Transfers Overall transfer level: Needs assistance Equipment used: Rolling walker (2 wheels) Transfers: Sit to/from Stand;Bed to chair/wheelchair/BSC Sit to Stand: Min assist;+2 safety/equipment Stand pivot transfers: Min guard   Step pivot transfers: Min assist;+2 safety/equipment         Balance Overall balance assessment: Needs assistance   Sitting balance-Leahy Scale: Good     Standing balance support: Bilateral upper extremity supported Standing balance-Leahy Scale: Poor                             ADL either performed or assessed with clinical judgement   ADL Overall ADL's : Needs assistance/impaired Eating/Feeding: Independent   Grooming: Set up   Upper Body Bathing: Set up   Lower Body Bathing: Moderate assistance   Upper Body Dressing : Set up   Lower Body Dressing: Maximal assistance   Toilet Transfer: Minimal assistance;+2 for safety/equipment;Stand-pivot;BSC/3in1   Toileting- Clothing Manipulation and Hygiene: Maximal assistance (Incontinent at baseline, has toilet tongs, but does not use)       Functional mobility during ADLs: Minimal assistance;Rolling walker (2 wheels) General ADL Comments: For toilet transfers only     Vision Baseline Vision/History: 1 Wears glasses       Perception     Praxis      Pertinent Vitals/Pain Pain Assessment: Faces Pain  Score: 6  Faces Pain Scale: Hurts even more Pain Location: left hip and shoulder Pain Descriptors / Indicators: Discomfort;Aching;Constant Pain Intervention(s): Limited activity  within patient's tolerance;Monitored during session     Hand Dominance Right   Extremity/Trunk Assessment Upper Extremity Assessment Upper Extremity Assessment: Overall WFL for tasks assessed (Painful left shoulder PTA) LUE Deficits / Details: limited AROM able to flex shoulder to 90 degrees reports pain   Lower Extremity Assessment Lower Extremity Assessment: Defer to PT evaluation   Cervical / Trunk Assessment Cervical / Trunk Assessment: Other exceptions Cervical / Trunk Exceptions: obesity with increased lordosis and sacral shelf   Communication Communication Communication: No difficulties   Cognition Arousal/Alertness: Awake/alert Behavior During Therapy: Flat affect Overall Cognitive Status: Within Functional Limits for tasks assessed Area of Impairment: Safety/judgement                         Safety/Judgement: Decreased awareness of safety;Decreased awareness of deficits           General Comments          Shoulder Instructions      Home Living Family/patient expects to be discharged to:: Skilled nursing facility Living Arrangements: Alone Available Help at Discharge: Personal care attendant Type of Home: Independent living facility Home Access: Elevator     Home Layout: One level     Bathroom Shower/Tub: Hospital doctor Toilet: Handicapped height     Home Equipment: Rollator (4 wheels);Wheelchair - power;Tub bench;Hand held shower head;Adaptive equipment Adaptive Equipment: Long-handled sponge;Reacher;Long-handled shoe horn Additional Comments:  (Lives in [place] which is handicapped accessible)      Prior Functioning/Environment Prior Level of Function : Needs assist       Physical Assist : ADLs (physical);Mobility (physical) Mobility (physical): Gait ADLs (physical): Bathing;Dressing;Toileting Mobility Comments: pt reports walking limited distance in room, power chair. ADLs Comments: meals brought to room, assist for  laundry and cleaning, as well as cream for her feet. PT bathes on bench with hand held shower and long handled sponge        OT Problem List: Decreased strength;Decreased range of motion;Decreased activity tolerance;Impaired balance (sitting and/or standing);Decreased coordination;Decreased knowledge of use of DME or AE;Cardiopulmonary status limiting activity;Obesity;Pain;Increased edema      OT Treatment/Interventions: Self-care/ADL training;Therapeutic exercise;Energy conservation;DME and/or AE instruction;Therapeutic activities;Patient/family education;Balance training    OT Goals(Current goals can be found in the care plan section) Acute Rehab OT Goals Patient Stated Goal: To get better and return to ILF OT Goal Formulation: With patient Time For Goal Achievement: 02/16/21 Potential to Achieve Goals: Good  OT Frequency: Min 2X/week    Co-evaluation              AM-PAC OT "6 Clicks" Daily Activity     Outcome Measure Help from another person eating meals?: None Help from another person taking care of personal grooming?: A Little Help from another person toileting, which includes using toliet, bedpan, or urinal?: A Lot Help from another person bathing (including washing, rinsing, drying)?: A Lot Help from another person to put on and taking off regular upper body clothing?: A Little Help from another person to put on and taking off regular lower body clothing?: A Lot 6 Click Score: 16   End of Session Equipment Utilized During Treatment: Gait belt;Rolling walker (2 wheels) Nurse Communication: Mobility status  Activity Tolerance: Patient limited by pain Patient left: in chair;with call bell/phone within reach;with chair alarm set  OT Visit Diagnosis:  Unsteadiness on feet (R26.81);Other abnormalities of gait and mobility (R26.89);Muscle weakness (generalized) (M62.81);Pain Pain - Right/Left: Right Pain - part of body: Hip                Time: 3300-7622 OT Time  Calculation (min): 16 min Charges:  OT General Charges $OT Visit: 1 Visit OT Evaluation $OT Eval Moderate Complexity: 1 Mod  Corinne Ports E. Lama Narayanan, OTR/L Acute Rehabilitation Services [phone] [place] 02/02/2021, 1:33 PM

## 2021-02-02 NOTE — Evaluation (Signed)
Physical Therapy Evaluation Patient Details Name: [name] MRN: [id number] DOB: Jun 04, 1945 Today's Date: 02/02/2021  History of Present Illness  76 yo admitted 1/14 with SOB and edema. PMhx: covid 19 2020 with respiratory failure on 3L at home, HFpEF, DM, HTN, OSA on CPAP, morbid obesity, psoriasis  Clinical Impression  Pt pleasant and eager to get OOB as pt reports increased left hip pain supine. Pt lives at [place] at [facility] and states she is waiting on ALF room to open up as she is aware she needs increased assist and is already getting assist for meals, laundry and foot care. PT with assist for bed mobility and limited tolerance for standing at this time who will benefit from ST-SNF until able to care for herself again. PT with decreased strength, function and activity tolerance who will benefit from acute therapy to maximize mobility, safety and function to decrease burden of care.         Recommendations for follow up therapy are one component of a multi-disciplinary discharge planning process, led by the attending physician.  Recommendations may be updated based on patient status, additional functional criteria and insurance authorization.  Follow Up Recommendations Skilled nursing-short term rehab (<3 hours/day)    Assistance Recommended at Discharge Intermittent Supervision/Assistance  Patient can return home with the following  A lot of help with walking and/or transfers;A lot of help with bathing/dressing/bathroom;Assistance with cooking/housework;Assist for transportation;Help with stairs or ramp for entrance    Equipment Recommendations BSC/3in1  Recommendations for Other Services       Functional Status Assessment Patient has had a recent decline in their functional status and/or demonstrates limited ability to make significant improvements in function in a reasonable and predictable amount of time     Precautions / Restrictions Precautions Precautions: Fall;Other  (comment) Precaution Comments: urinary incontinence. PT reports pain left hip and left shoulder      Mobility  Bed Mobility Overal bed mobility: Needs Assistance Bed Mobility: Supine to Sit     Supine to sit: Min assist;HOB elevated     General bed mobility comments: HOB 30 degrees with RUE assist to elevate trunk, pt reporting Left shoulder pain and unable to utilize arm to rise. Pt reports sleeping prone on flat bed at home    Transfers Overall transfer level: Needs assistance   Transfers: Sit to/from Stand;Bed to chair/wheelchair/BSC Sit to Stand: Min guard Stand pivot transfers: Min guard         General transfer comment: guarding with increased time and effort to rise from low surface and pivot with RW to chair. Assist of RW stabilized to stand    Ambulation/Gait               General Gait Details: pt denied attempting due to pain  Stairs            Wheelchair Mobility    Modified Rankin (Stroke Patients Only)       Balance Overall balance assessment: Needs assistance   Sitting balance-Leahy Scale: Fair     Standing balance support: Bilateral upper extremity supported Standing balance-Leahy Scale: Poor                               Pertinent Vitals/Pain Pain Assessment: 0-10 Pain Score: 6  Pain Location: left hip and shoulder Pain Descriptors / Indicators: Discomfort;Aching;Constant Pain Intervention(s): Limited activity within patient's tolerance;Premedicated before session;Monitored during session;Repositioned    Home Living Family/patient expects to  be discharged to:: Private residence Living Arrangements: Alone Available Help at Discharge: Personal care attendant Type of Home: Independent living facility Home Access: [place]: One [place]: [place] (4 wheels);Wheelchair - power;Tub bench;Hand held shower head;Adaptive equipment      Prior Function Prior Level of Function : Needs  assist       Physical Assist : ADLs (physical);Mobility (physical) Mobility (physical): Gait   Mobility Comments: pt reports walking limited distance in room, power chair. ADLs Comments: meals brought to room, assist for laundry and cleaning, as well as cream for her feet. PT bathes on bench with hand held shower and long handled sponge     Hand Dominance        Extremity/Trunk Assessment   Upper Extremity Assessment Upper Extremity Assessment: Generalized weakness;LUE deficits/detail LUE Deficits / Details: limited AROM able to flex shoulder to 90 degrees reports pain    Lower Extremity Assessment Lower Extremity Assessment: Generalized weakness (grossly 2/5 denied formal assessment and limited mobility tolerance)    Cervical / Trunk Assessment Cervical / Trunk Assessment: Other exceptions Cervical / Trunk Exceptions: obesity with increased lordosis and sacral shelf  Communication   Communication: No difficulties  Cognition Arousal/Alertness: Awake/alert Behavior During Therapy: Flat affect Overall Cognitive Status: Impaired/Different from baseline Area of Impairment: Safety/judgement                         Safety/Judgement: Decreased awareness of safety;Decreased awareness of deficits              General Comments      Exercises General Exercises - Lower Extremity Short Arc Quad: AROM;Both;10 reps;Seated   Assessment/Plan    PT Assessment Patient needs continued PT services  PT Problem List Decreased strength;Decreased balance;Pain;Obesity;Cardiopulmonary status limiting activity;Decreased activity tolerance       PT Treatment Interventions DME instruction;Functional mobility training;Balance training;Gait training;Therapeutic activities;Therapeutic exercise    PT Goals (Current goals can be found in the Care Plan section)  Acute Rehab PT Goals Patient Stated Goal: return to my room and computer PT Goal Formulation: With patient Time For  Goal Achievement: 02/16/21 Potential to Achieve Goals: Fair    Frequency Min 2X/week     Co-evaluation               AM-PAC PT "6 Clicks" Mobility  Outcome Measure Help needed turning from your back to your side while in a flat bed without using bedrails?: A Little Help needed moving from lying on your back to sitting on the side of a flat bed without using bedrails?: A Little Help needed moving to and from a bed to a chair (including a wheelchair)?: A Little Help needed standing up from a chair using your arms (e.g., wheelchair or bedside chair)?: A Little Help needed to walk in hospital room?: Total Help needed climbing 3-5 steps with a railing? : Total 6 Click Score: 14    End of Session Equipment Utilized During Treatment: Oxygen (pt denied belt use due to claustrophobia) Activity Tolerance: Patient limited by pain Patient left: in chair;with call bell/phone within reach;with chair alarm set;with family/visitor present Nurse Communication: Mobility status PT Visit Diagnosis: Other abnormalities of gait and mobility (R26.89);Muscle weakness (generalized) (M62.81);Difficulty in walking, not elsewhere classified (R26.2)    Time: 1856-3149 PT Time Calculation (min) (ACUTE ONLY): 26 min   Charges:   PT Evaluation $PT Eval Moderate Complexity: 1 Mod PT Treatments $Therapeutic Activity: 8-22  mins        Bayard Males, PT Acute Rehabilitation Services Pager: [phone] Office: [place] 02/02/2021, 10:43 AM

## 2021-02-02 NOTE — Consult Note (Addendum)
[place] Nurse Consult Note: Reason for Consult: Consult requested for left leg.  Pt states she had  a large amt yellow drainage and blisters previously to this location which ruptured and evolved into partial thickness wounds.  Red moist patchy areas of partial thickness skin loss, small amt yellow drainage, affected area approx 4X4cm. Left leg with generalized edema.  Dressing procedure/placement/frequency: Topical treatment orders provided for bedside nurses to perform to absorb drainage and promote healing: Apply foam dressing to left posterior leg.  Hold in place with kerlex if needed. Please re-consult if further assistance is needed.  Thank-you,  Julien Girt MSN, [place], [place], [place], [place]

## 2021-02-02 NOTE — NC FL2 (Signed)
[place] LEVEL OF CARE SCREENING TOOL     IDENTIFICATION  Patient Name: [name] Birthdate: 11-Apr-1945 Sex: female Admission Date (Current Location): 01/31/2021  [facility] and [place] Number:  Herbalist and Address:  [facility], [place] [address], [place], [place] [address]      Provider Number: [id number]  Attending Physician Name and Address:  Kerney Elbe, DO  Relative Name and Phone Number:  Ellarae, Nevitt (Son)   [phone] [facility])    Current Level of Care: Hospital Recommended Level of Care: [place] Prior Approval Number:    Date Approved/Denied:   PASRR Number: [id number]  Discharge Plan: SNF    Current Diagnoses: Patient Active Problem List   Diagnosis Date Noted   Leg wound, left 02/01/2021   SIRS (systemic inflammatory response syndrome) ([place]) 05/03/2020   DM2 (diabetes mellitus, type 2) ([place]) 05/03/2020   Chronic respiratory failure with hypoxia and hypercapnia ([place]) 05/03/2020   Cervical disc prolapse with radiculopathy 12/07/2019   History of pheochromocytoma 12/07/2019   Non-comitant strabismus 12/07/2019   OSA on CPAP 12/07/2019   Primary osteoarthritis involving multiple joints 06/20/2019   Gait abnormality 06/19/2019   Psoriasis with arthropathy ([place]) 04/06/2017   TIA (transient ischemic attack) 03/22/2017   Acute on chronic diastolic CHF (congestive heart failure) ([place]) 03/09/2017   Diabetic hypoglycemia ([place]) 03/05/2014   Diabetic neuropathic arthropathy ([place]) 03/05/2014   Dizzy 12/28/2011   CAD (coronary artery disease), native coronary artery 12/05/2010   Morbid obesity ([place]) 12/05/2010   Essential hypertension 12/03/2010   HLD (hyperlipidemia) 12/03/2010   UI (urinary incontinence) 12/03/2010   Chest pain 05/31/2006    Orientation RESPIRATION BLADDER Height & Weight     Self, Time, Place, Situation  O2 (3LNC) External catheter Weight: (!) 332 lb 3.2 oz (150.7  kg) Height:  5\' 4"  (162.6 cm)  BEHAVIORAL SYMPTOMS/MOOD NEUROLOGICAL BOWEL NUTRITION STATUS      Continent Diet (see d/c summary)  AMBULATORY STATUS COMMUNICATION OF NEEDS Skin   Extensive Assist Verbally Surgical wounds (Incision, left tibial, posterior)                       Personal Care Assistance Level of Assistance  Bathing, Feeding, Dressing Bathing Assistance: Maximum assistance Feeding assistance: Independent Dressing Assistance: Maximum assistance     Functional Limitations Info  Sight, Hearing, Speech Sight Info: Adequate Hearing Info: Adequate Speech Info: Adequate    SPECIAL CARE FACTORS FREQUENCY  PT (By licensed PT), OT (By licensed OT)     PT Frequency: 5x/week OT Frequency: 5x/week            Contractures Contractures Info: Not present    Additional Factors Info  Code Status, Allergies Code Status Info: Full code Allergies Info: Tramadol-acetaminophen, metaxalone, nitroglycerin, Icosapent Ethyl, metformin, Brompheniramine-pseudoeph, procain, Meprobamate, Methyldopa, Metoprolol, tramadol, Pseudoephedrine Hcl           Current Medications (02/02/2021):  This is the current hospital active medication list Current Facility-Administered Medications  Medication Dose Route Frequency Provider Last Rate Last Admin   acetaminophen (TYLENOL) tablet 650 mg  650 mg Oral Q6H PRN Opyd, Ilene Qua, MD       Or   acetaminophen (TYLENOL) suppository 650 mg  650 mg Rectal Q6H PRN Opyd, Ilene Qua, MD       albuterol (PROVENTIL) (2.5 MG/3ML) 0.083% nebulizer solution 2.5 mg  2.5 mg Inhalation QID PRN Opyd, Ilene Qua, MD  amLODipine (NORVASC) tablet 5 mg  5 mg Oral Daily Opyd, Ilene Qua, MD   5 mg at 02/02/21 1053   aspirin EC tablet 81 mg  81 mg Oral Daily Opyd, Ilene Qua, MD   81 mg at 02/02/21 1033   carvedilol (COREG) tablet 25 mg  25 mg Oral BID WC Opyd, Ilene Qua, MD   25 mg at 02/02/21 9379   diclofenac Sodium (VOLTAREN) 1 % topical gel 2-4 g  2-4 g  Topical QID PRN Opyd, Ilene Qua, MD   4 g at 02/01/21 2254   enoxaparin (LOVENOX) injection 40 mg  40 mg Subcutaneous Daily Opyd, Ilene Qua, MD   40 mg at 02/02/21 1033   fentaNYL (SUBLIMAZE) injection 12.5-50 mcg  12.5-50 mcg Intravenous Q2H PRN Opyd, Ilene Qua, MD   50 mcg at 02/02/21 1053   fluticasone furoate-vilanterol (BREO ELLIPTA) 200-25 MCG/ACT 1 puff  1 puff Inhalation Daily Opyd, Ilene Qua, MD   1 puff at 02/02/21 0850   insulin aspart (novoLOG) injection 0-15 Units  0-15 Units Subcutaneous TID WC Opyd, Ilene Qua, MD   5 Units at 02/02/21 1122   insulin aspart (novoLOG) injection 0-5 Units  0-5 Units Subcutaneous QHS Opyd, Ilene Qua, MD       insulin aspart (novoLOG) injection 6 Units  6 Units Subcutaneous TID WC Opyd, Ilene Qua, MD   6 Units at 02/02/21 1116   insulin glargine-yfgn (SEMGLEE) injection 50 Units  50 Units Subcutaneous BID Opyd, Ilene Qua, MD   50 Units at 02/02/21 1032   lisinopril (ZESTRIL) tablet 10 mg  10 mg Oral Daily Opyd, Ilene Qua, MD   10 mg at 02/02/21 1033   melatonin tablet 5 mg  5 mg Oral QHS Opyd, Ilene Qua, MD       methocarbamol (ROBAXIN) 500 mg in dextrose 5 % 50 mL IVPB  500 mg Intravenous Q6H PRN Opyd, Ilene Qua, MD 100 mL/hr at 02/01/21 2257 500 mg at 02/01/21 2257   ranolazine (RANEXA) 12 hr tablet 500 mg  500 mg Oral BID Opyd, Ilene Qua, MD   500 mg at 02/02/21 1033   rosuvastatin (CRESTOR) tablet 20 mg  20 mg Oral QHS Opyd, Ilene Qua, MD   20 mg at 02/01/21 2150   sodium chloride flush (NS) 0.9 % injection 3 mL  3 mL Intravenous Q12H Opyd, Ilene Qua, MD   3 mL at 02/02/21 1034   umeclidinium bromide (INCRUSE ELLIPTA) 62.5 MCG/ACT 1 puff  1 puff Inhalation Daily Opyd, Ilene Qua, MD   1 puff at 02/02/21 0850     Discharge Medications: Please see discharge summary for a list of discharge medications.  Relevant Imaging Results:  Relevant Lab Results:   Additional Information SSN [id number]  Bethann Berkshire, LCSW

## 2021-02-02 NOTE — Progress Notes (Signed)
RT Note:  Set up patient home CPAP at bedside table per pt request. Plugged into red outlet. O2 tubing in line. RN aware of how to  hook up oxygen when patient ready for CPA. Patient and RN also aware to call Respiratory if assistance needed. Patient currently on 3L [place]

## 2021-02-02 NOTE — Progress Notes (Signed)
Heart Failure Nurse Navigator Progress Note  Attempted to assess for HV TOC readiness, pt currently on BSC. Pt appears not very functional with mobility, utilizing walker to stand an pivot to [facility] next to recliner. Upon entry to room pt immediately asked for pain medication prior to introduction, while on BSC. Passed request to bedside RN.   Will attempt to interview at a later time.   Pricilla Holm, MSN, RN Heart Failure Nurse Navigator [phone]

## 2021-02-02 NOTE — Progress Notes (Addendum)
Progress Note  Patient Name: [name] Date of Encounter: 02/02/2021  [facility] Cardiologist: Evalina Field, MD   Subjective   Patient says she has chronic chest pain for years, usually occurs with increased stress, not with physical exertion. She was on O2 in 2020 after COVID, was able to wean off O2 afterward. Patient had worsening dyspnea require admission since last Easter and has not been able to wean off O2 at home since. Last episode of chest pain was Saturday. Urine output not accurate due to incontinence.   Inpatient Medications    Scheduled Meds:  amLODipine  5 mg Oral Daily   aspirin EC  81 mg Oral Daily   carvedilol  25 mg Oral BID WC   enoxaparin (LOVENOX) injection  40 mg Subcutaneous Daily   fluticasone furoate-vilanterol  1 puff Inhalation Daily   furosemide  40 mg Intravenous BID   insulin aspart  0-15 Units Subcutaneous TID WC   insulin aspart  0-5 Units Subcutaneous QHS   insulin aspart  6 Units Subcutaneous TID WC   insulin glargine-yfgn  50 Units Subcutaneous BID   lisinopril  10 mg Oral Daily   melatonin  5 mg Oral QHS   ranolazine  500 mg Oral BID   rosuvastatin  20 mg Oral QHS   sodium chloride flush  3 mL Intravenous Q12H   umeclidinium bromide  1 puff Inhalation Daily   Continuous Infusions:  methocarbamol (ROBAXIN) IV 500 mg (02/01/21 2257)   PRN Meds: acetaminophen **OR** acetaminophen, albuterol, diclofenac Sodium, fentaNYL (SUBLIMAZE) injection, methocarbamol (ROBAXIN) IV   Vital Signs    Vitals:   02/01/21 1604 02/01/21 1807 02/01/21 2013 02/02/21 0500  BP:   (!) 108/53 107/67  Pulse:  87 87 84  Resp:   20 (!) 21  Temp: 98.8 F (37.1 C)  98.2 F (36.8 C) 98.1 F (36.7 C)  TempSrc: Oral  Oral Oral  SpO2:  100% 100% 99%  Weight:    (!) 150.7 kg  Height:        Intake/Output Summary (Last 24 hours) at 02/02/2021 0755 Last data filed at 02/01/2021 2234 Gross per 24 hour  Intake 50 ml  Output 500 ml  Net -450 ml   Last  3 Weights 02/02/2021 01/31/2021 05/03/2020  Weight (lbs) 332 lb 3.2 oz 330 lb 333 lb 5.4 oz  Weight (kg) 150.685 kg 149.687 kg 151.2 kg      Telemetry    NSR with HR 70-80s  - Personally Reviewed  ECG    NSR without significant ST-T wave changes - Personally Reviewed  Physical Exam   GEN: No acute distress.   Neck: No JVD Cardiac: RRR, no murmurs, rubs, or gallops.  Respiratory: Clear to auscultation bilaterally. GI: Soft, nontender, non-distended  MS: 2+ nonpitting edema; No deformity. Neuro:  Nonfocal  Psych: Normal affect   Labs    High Sensitivity Troponin:   Recent Labs  Lab 01/31/21 1829 01/31/21 2129 02/01/21 0325  TROPONINIHS 24* 47* 81*     Chemistry Recent Labs  Lab 01/31/21 1829 02/01/21 0325 02/02/21 0124  NA 138 137 138  K 4.1 4.3 4.3  CL 98 98 97*  CO2 29 30 28   GLUCOSE 115* 168* 131*  BUN 23 19 29*  CREATININE 0.96 0.96 1.24*  CALCIUM 9.5 9.1 9.9  MG  --   --  1.8  PROT  --   --  7.1  ALBUMIN  --   --  3.4*  AST  --   --  20  ALT  --   --  18  ALKPHOS  --   --  44  BILITOT  --   --  0.7  GFRNONAA >60 >60 46*  ANIONGAP 11 9 13     Lipids  Recent Labs  Lab 02/01/21 0325  CHOL 145  TRIG 337*  HDL 40*  LDLCALC 38  CHOLHDL 3.6    Hematology Recent Labs  Lab 01/31/21 1829 02/01/21 0325 02/02/21 0124  WBC 7.8 7.6 8.7  RBC 3.67* 3.78* 3.54*  HGB 11.0* 11.2* 10.3*  HCT 34.4* 36.2 33.2*  MCV 93.7 95.8 93.8  MCH 30.0 29.6 29.1  MCHC 32.0 30.9 31.0  RDW 16.6* 16.7* 16.7*  PLT 203 208 204   Thyroid No results for input(s): TSH, FREET4 in the last 168 hours.  BNP Recent Labs  Lab 01/31/21 1829  BNP 113.8*    DDimer No results for input(s): DDIMER in the last 168 hours.   Radiology    DG Chest 2 View  Result Date: 01/31/2021 CLINICAL DATA:  Chest pain EXAM: CHEST - 2 VIEW COMPARISON:  05/03/2020 FINDINGS: Stable cardiomegaly. Aortic atherosclerosis. Streaky bibasilar opacities. No overt edema. No pleural effusion or  pneumothorax. IMPRESSION: Streaky bibasilar opacities, favor atelectasis. Electronically Signed   By: Davina Poke D.O.   On: 01/31/2021 19:17   DG Pelvis 1-2 Views  Result Date: 01/31/2021 CLINICAL DATA:  Pelvic pain particularly on the left, initial encounter EXAM: PELVIS - 1 VIEW COMPARISON:  None. FINDINGS: Pelvic ring is intact. Degenerative changes of the hip joints are noted bilaterally. No acute fracture or dislocation is seen. No soft tissue abnormality is noted. IMPRESSION: Degenerative changes without acute abnormality. Electronically Signed   By: Inez Catalina M.D.   On: 01/31/2021 19:13   DG Femur Min 2 Views Left  Result Date: 01/31/2021 CLINICAL DATA:  Left hip pain, no known injury, initial encounter EXAM: LEFT FEMUR 2 VIEWS COMPARISON:  None. FINDINGS: Degenerative changes of left hip joint are noted. No acute fracture or dislocation is noted. Degenerative changes in the knee joint are seen as well. No soft tissue abnormality is noted. IMPRESSION: Nerve changes without acute abnormality. Electronically Signed   By: Inez Catalina M.D.   On: 01/31/2021 19:13    Cardiac Studies   Echo 09/05/2019  1. Hypokinesis of the mid to apical inferior and inferolateral  myocardium. Left ventricular ejection fraction, by estimation, is 50 to  55%. The left ventricle has low normal function. The left ventricle has no  regional wall motion abnormalities. Left  ventricular diastolic parameters are consistent with Grade II diastolic  dysfunction (pseudonormalization). Elevated left ventricular end-diastolic  pressure.   2. Right ventricular systolic function is normal. The right ventricular  size is normal.   3. The mitral valve is normal in structure. Trivial mitral valve  regurgitation. No evidence of mitral stenosis.   4. The aortic valve was not well visualized. Aortic valve regurgitation  is trivial. No aortic stenosis is present.   5. The inferior vena cava is dilated in size with <50%  respiratory  variability, suggesting right atrial pressure of 15 mmHg.   Patient Profile     76 y.o. female with PMH of chronic resp failure on LPM O2 since COVID 2020, HFpEP, HTN, DM II, OSA on CPAP, CAD with chronically occluded RCA 2015 (med therapy), morbid obesity who presented with chest pain and SOB.   Assessment & Plan    Acute on chronic diastolic heart failure  - Echo  2021 EF 50-55%, trivial mitral valve regurg and AI. - pending echocardiogram - I/O not accurate. No sign of pulm edema. Body habitus makes very hard to assess volume.  - continue IV diuresis, follow renal function.    Chest pain: - history of chronic occluded RCA - trop 24 -->47 -->81 - suspicion for ACS very low, no plan for ischemic evaluation. Pending echo.   HTN  DM II: hgb 7.2  OSA on CPAP  Morbid obesity      For questions or updates, please contact [place] Please consult [email or web address] for contact info under        Signed, Almyra Deforest, [place]  02/02/2021, 7:55 AM    Patient seen and examined   I agree with findings as noted by Janan Ridge above  Pt clinically responding ot medical Rx with diuretics   Incontinence makes quantitation difficult  Denies CP   Breathing is some better  Morbidly obese 76 yo   Neck  No definite JVD Lungs are  CTA Cardiac exam:  RRR  No S3  Ex  1+ edema    I have reviewed echo  Poor acoustic windows lmit study   OVerall LVEf appears vigouroous   Basal hypokinesis.   I would hold further IV diuresis   today with bump in BUN/CrGot AM dose today  REcheck in am  Dorris Carnes MD

## 2021-02-03 ENCOUNTER — Other Ambulatory Visit: Payer: Self-pay | Admitting: Cardiology

## 2021-02-03 ENCOUNTER — Inpatient Hospital Stay (HOSPITAL_COMMUNITY): Payer: Medicare Other

## 2021-02-03 DIAGNOSIS — I5033 Acute on chronic diastolic (congestive) heart failure: Secondary | ICD-10-CM | POA: Diagnosis not present

## 2021-02-03 LAB — RETICULOCYTES
Immature Retic Fract: 28.7 % — ABNORMAL HIGH (ref 2.3–15.9)
RBC.: 3.64 MIL/uL — ABNORMAL LOW (ref 3.87–5.11)
Retic Count, Absolute: 93.2 10*3/uL (ref 19.0–186.0)
Retic Ct Pct: 2.6 % (ref 0.4–3.1)

## 2021-02-03 LAB — CBC WITH DIFFERENTIAL/PLATELET
Abs Immature Granulocytes: 0.03 10*3/uL (ref 0.00–0.07)
Basophils Absolute: 0 10*3/uL (ref 0.0–0.1)
Basophils Relative: 1 %
Eosinophils Absolute: 0.2 10*3/uL (ref 0.0–0.5)
Eosinophils Relative: 2 %
HCT: 33.5 % — ABNORMAL LOW (ref 36.0–46.0)
Hemoglobin: 10.7 g/dL — ABNORMAL LOW (ref 12.0–15.0)
Immature Granulocytes: 0 %
Lymphocytes Relative: 11 %
Lymphs Abs: 0.8 10*3/uL (ref 0.7–4.0)
MCH: 29.8 pg (ref 26.0–34.0)
MCHC: 31.9 g/dL (ref 30.0–36.0)
MCV: 93.3 fL (ref 80.0–100.0)
Monocytes Absolute: 0.7 10*3/uL (ref 0.1–1.0)
Monocytes Relative: 9 %
Neutro Abs: 5.5 10*3/uL (ref 1.7–7.7)
Neutrophils Relative %: 77 %
Platelets: 207 10*3/uL (ref 150–400)
RBC: 3.59 MIL/uL — ABNORMAL LOW (ref 3.87–5.11)
RDW: 16.5 % — ABNORMAL HIGH (ref 11.5–15.5)
WBC: 7.2 10*3/uL (ref 4.0–10.5)
nRBC: 0 % (ref 0.0–0.2)

## 2021-02-03 LAB — VITAMIN B12: Vitamin B-12: 796 pg/mL (ref 180–914)

## 2021-02-03 LAB — COMPREHENSIVE METABOLIC PANEL
ALT: 20 U/L (ref 0–44)
AST: 19 U/L (ref 15–41)
Albumin: 3.5 g/dL (ref 3.5–5.0)
Alkaline Phosphatase: 42 U/L (ref 38–126)
Anion gap: 13 (ref 5–15)
BUN: 39 mg/dL — ABNORMAL HIGH (ref 8–23)
CO2: 25 mmol/L (ref 22–32)
Calcium: 9.5 mg/dL (ref 8.9–10.3)
Chloride: 98 mmol/L (ref 98–111)
Creatinine, Ser: 1.25 mg/dL — ABNORMAL HIGH (ref 0.44–1.00)
GFR, Estimated: 46 mL/min — ABNORMAL LOW (ref >60)
Glucose, Bld: 143 mg/dL — ABNORMAL HIGH (ref 70–99)
Potassium: 4 mmol/L (ref 3.5–5.1)
Sodium: 136 mmol/L (ref 135–145)
Total Bilirubin: 0.7 mg/dL (ref 0.3–1.2)
Total Protein: 7.1 g/dL (ref 6.5–8.1)

## 2021-02-03 LAB — PHOSPHORUS: Phosphorus: 4.6 mg/dL (ref 2.5–4.6)

## 2021-02-03 LAB — IRON AND TIBC
Iron: 44 ug/dL (ref 28–170)
Saturation Ratios: 11 % (ref 10.4–31.8)
TIBC: 402 ug/dL (ref 250–450)
UIBC: 358 ug/dL

## 2021-02-03 LAB — GLUCOSE, CAPILLARY
Glucose-Capillary: 176 mg/dL — ABNORMAL HIGH (ref 70–99)
Glucose-Capillary: 181 mg/dL — ABNORMAL HIGH (ref 70–99)
Glucose-Capillary: 191 mg/dL — ABNORMAL HIGH (ref 70–99)
Glucose-Capillary: 185 mg/dL — ABNORMAL HIGH (ref 70–99)

## 2021-02-03 LAB — FERRITIN: Ferritin: 46 ng/mL (ref 11–307)

## 2021-02-03 LAB — FOLATE: Folate: 26.4 ng/mL (ref >5.9)

## 2021-02-03 LAB — MAGNESIUM: Magnesium: 1.8 mg/dL (ref 1.7–2.4)

## 2021-02-03 MED ORDER — SENNOSIDES-DOCUSATE SODIUM 8.6-50 MG PO TABS
1.0000 | ORAL_TABLET | Freq: Two times a day (BID) | ORAL | Status: DC
  Administered 2021-02-03 – 2021-02-04 (×2): 1 via ORAL
  Filled 2021-02-03 (×4): qty 1

## 2021-02-03 MED ORDER — KETOROLAC TROMETHAMINE 15 MG/ML IJ SOLN
15.0000 mg | Freq: Once | INTRAMUSCULAR | Status: AC
  Administered 2021-02-03: 15 mg via INTRAVENOUS
  Filled 2021-02-03: qty 1

## 2021-02-03 MED ORDER — POLYETHYLENE GLYCOL 3350 17 G PO PACK
17.0000 g | PACK | Freq: Two times a day (BID) | ORAL | Status: DC
  Administered 2021-02-03 – 2021-02-04 (×2): 17 g via ORAL
  Filled 2021-02-03 (×4): qty 1

## 2021-02-03 MED ORDER — BISACODYL 10 MG RE SUPP: 10.0000 mg | Freq: Every day | RECTAL | Status: DC | PRN

## 2021-02-03 NOTE — Progress Notes (Signed)
Because of PROGRESS NOTE    [name]  IOE:[id number] DOB: 05-02-1945 DOA: 01/31/2021 PCP: [name], NP   Brief Narrative:  The patient is a 76 year old morbidly obese Caucasian female with a past medical history significant for but not limited to chronic diastolic CHF, CAD, chronic hypoxic respiratory failure on 3 L of supplemental oxygen via nasal cannula, OSA on CPAP, hypertension, insulin-dependent diabetes mellitus type 2, history of pheochromocytoma status post right adrenalectomy and psoriasis who presented with several days of intermittent chest pain and increased leg swelling.  She reported that she been experiencing chest discomfort intermittently over the last several days that is a central dull and nonradiating and this is this happens when she is fluid overload in the past.  She denies any nausea, diaphoresis and did not have a pressure sensation as when she did when she had a prior MI.  She called EMS instructed to take 4 baby aspirin and was brought to the ED.  Upon arrival to the ED she is afebrile and saturating 100% on 3 L submental oxygen via nasal cannula.  She has minimal ST depressions inferior laterally and chest x-ray showed some streaky bibasilar opacities.  Troponin level initially was 24 and then trended up 47 and then finally 81.  BNP was 114.  ED discussed with cardiology who advised against heparin at this time.  She was admitted and is now being diuresed and cardiology is following and holding off further IV diuresis given her bump in creatinine.  Patient's breathing is better but she is now complaining of some significant pain in the hips so she was trialed on a little bit of Toradol which has helped but will need to watch how much we give her given her renal dysfunction.  Assessment & Plan:   Principal Problem:   Acute on chronic diastolic CHF (congestive heart failure) (HCC) Active Problems:   CAD (coronary artery disease), native coronary artery   Chest  pain   Essential hypertension   OSA on CPAP   DM2 (diabetes mellitus, type 2) (HCC)   Chronic respiratory failure with hypoxia and hypercapnia (HCC)   Leg wound, left  Acute on Chronic Diastolic CHF, improved - Pt with HFpEF presents with increased leg swelling and chest pain similar to when she has been hypervolemic and is found to have elevated BNP at 113.8 (previously normal)   -Diuresis with IV Lasix and now been held given her bump in creatinine -Continue Coreg and lisinopril, monitor wt and I/Os; she is negative 447 mL since admission -Cardiology was consulted for further evaluation recommendations and initially recommended continuing diuresis with IV Lasix and transition to oral diuretics soon but now cardiology is holding further IV diuresis given her bump in creatinine as patient's BUN/creatinine went from 19/0.96 and now 29/1.24 yesterday and today it is 39/1.25 -Repeat echocardiogram done and a poor acoustic window limit the study with an overall LVEF appearing vigorous -Repeat chest x-ray today showed "Cardiomegaly, without congestive failure. Limited evaluation of the lung bases; probable subsegmental atelectasis." -Cardiology plans to restart torsemide when her being/creatinine returned to baseline and they are going to arrange all follow-up at discharge   Chest pain, improved -Pt with hx of CAD s/p angioplasty in 1992 and [place] at [facility] in 2015 with total occlusion of RCA with collaterals presents with chest pain  -She reports the pain is similar to when she has had volume o/l in the past and unlike her prior angina  -Troponin is 24 then 47 in  ED; ED PA spoke with cardiology who advised against IV heparin  -Troponin trended up to 81  -Continue ASA, statin, Ranexa, and beta-blocker; trend troponin, and follow-up echo -Cardiology consulted for further evaluation and evaluations; she is extremely sensitive sublingual nitroglycerin this is being avoided; she has no recent lipid profile  so they are going to order and that showed a LDL of 38 and HDL 40   Chronic Hypoxic respiratory failure; OSA  -Uses 3 Lpm supplemental O2 since suffering from COVID two yrs ago, also uses CPAP at home  -SpO2: 95 % O2 Flow Rate (L/min): 3 L/min FiO2 (%): 32 % -Appears stable, continue supplemental O2 and CPAP qHS  -Repeat CXR showed "Cardiomegaly with pulmonary vascular prominence, however without overt edema. No new or focal airspace opacity."  -Repeat chest x-ray in a.m. and will need an Ambulatory home O2 screen prior to discharge -PT/OT recommending SNF and she is improved to be discharged to SNF once bed is available and insurance authorization is obtained  Left Leg Wound -WOC Nurse consulted  -Chronic wound involving lower Lt leg posteriorly, does not appear infected    IDDM  -A1c was 7.7% in April 2022 and repeat was 7.2 this Admission -Continue CBG checks and continue with moderate NovoLog sliding scale insulin AC/HS -Continue with Semglee 15 units twice daily and insulin Aspart 6 Units 3 Times Daily with meals  -CBGs ranging from 176-232  Normocytic Anemia -Patient's Hgb/Hct went from 11.0/34.4 -> 11.2/36.2 -> 10.3/33.2 and today it was 10.7/33.5 -Checked Anemia Panel that showed an iron level of 44, U IBC 358, TIBC 4 2, saturation ratios of 11%, ferritin level of 46, folate level 26.4, vitamin B12 796 -Continue to Monitor for S/Sx of Bleeding; No overt bleeding noted -Repeat CBC in the AM  Left Side Hip pain, improving -DG Pelvis showed "Degenerative changes without acute abnormality." -DG Femur showed "Degenerative changes of left hip joint are noted. No acute fracture or dislocation is noted. Degenerative changes in the knee joint are seen as well. No soft tissue abnormality is noted." -C/w Fentanyl 12.5-50 mcg IV q2hprn Severe Pain -Will give another 1x dose of IV 15 mg of Ketorolac again  -C/w Methocarbamol 500 mg IV q6hprn Muscle Spasms  Constipation -Start bowel  regimen and initiate MiraLAX 17 g p.o. twice daily as well as senna docusate 1 tab p.o. twice daily   Morbid Obesity -Complicates overall prognosis and care -Estimated body mass index is 57.02 kg/m as calculated from the following:   Height as of this encounter: 5\' 4"  (1.626 m).   Weight as of this encounter: 150.7 kg.  -Weight Loss and Dietary Counseling given  DVT prophylaxis: Enoxaparin 40 mg sq q24h Code Status: FULL CODE  Family Communication: No family present at bedside  Disposition Plan: Pending further clinical improvement and evaluation by PT/OT  Status is: Inpatient   The patient will require care spanning > 2 midnights and should be moved to inpatient because: Still getting IV Diuresis and needs Cardiology Clearance   Consultants:  Cardiology  Procedures:  ECHOCARDIOGRAM IMPRESSIONS     1. Very limited study with poor echo windows. Definity contrast was  administered.   2. Left ventricular ejection fraction, by estimation, is >75%. The left  ventricle has hyperdynamic function. The left ventricle has no regional  wall motion abnormalities. Left ventricular diastolic function could not  be evaluated.   3. Right ventricular systolic function was not well visualized. The right  ventricular size is not well visualized.  4. The mitral valve was not well visualized. No evidence of mitral valve  regurgitation.   5. The aortic valve was not well visualized. Aortic valve regurgitation  is not visualized.   6. The inferior vena cava is normal in size with greater than 50%  respiratory variability, suggesting right atrial pressure of 3 mmHg.   Comparison(s): Changes from prior study are noted. 09/05/2019: LVEF 50-55%.   FINDINGS   Left Ventricle: Left ventricular ejection fraction, by estimation, is  >75%. The left ventricle has hyperdynamic function. The left ventricle has  no regional wall motion abnormalities. Definity contrast agent was given  IV to delineate the  left  ventricular endocardial borders. The left ventricular internal cavity size  was normal in size. Suboptimal image quality limits for assessment of left  ventricular hypertrophy. Left ventricular diastolic function could not be  evaluated.   Right Ventricle: The right ventricular size is not well visualized. Right  vetricular wall thickness was not well visualized. Right ventricular  systolic function was not well visualized.   Left Atrium: Left atrial size was not well visualized.   Right Atrium: Right atrial size was not well visualized.   Pericardium: There is no evidence of pericardial effusion.   Mitral Valve: The mitral valve was not well visualized. No evidence of  mitral valve regurgitation.   Tricuspid Valve: The tricuspid valve is not well visualized. Tricuspid  valve regurgitation is not demonstrated.   Aortic Valve: The aortic valve was not well visualized. Aortic valve  regurgitation is not visualized. Aortic valve peak gradient measures 18.3  mmHg.   Pulmonic Valve: The pulmonic valve was not well visualized. Pulmonic valve  regurgitation is not visualized.   Aorta: The aortic root and ascending aorta are structurally normal, with  no evidence of dilitation.   Venous: The inferior vena cava is normal in size with greater than 50%  respiratory variability, suggesting right atrial pressure of 3 mmHg.   IAS/Shunts: No atrial level shunt detected by color flow Doppler.      LEFT VENTRICLE  PLAX 2D  LVOT diam:     2.00 cm  LV SV:         69  LV SV Index:   28  LVOT Area:     3.14 cm      IVC  IVC diam: 2.00 cm   AORTIC VALVE                 PULMONIC VALVE  AV Area (Vmax): 1.79 cm     PV Vmax:       0.88 m/s  AV Vmax:        214.00 cm/s  PV Peak grad:  3.1 mmHg  AV Peak Grad:   18.3 mmHg  LVOT Vmax:      122.00 cm/s  LVOT Vmean:     73.000 cm/s  LVOT VTI:       0.219 m     AORTA  Ao Root diam: 3.30 cm  Ao Asc diam:  3.00 cm   MITRAL VALVE   MV Area (PHT): 4.99 cm    SHUNTS  MV Decel Time: 152 msec    Systemic VTI:  0.22 m  MV E velocity: 88.80 cm/s  Systemic Diam: 2.00 cm  MV A velocity: 57.30 cm/s  MV E/A ratio:  1.55   Antimicrobials:  Anti-infectives (From admission, onward)    None        Subjective: Seen and examined at bedside and states that  her breathing is doing better.  Hip pain is also improved but still there.  Has not had a bowel movement in almost a week.  No chest pain or shortness breath.  Feels well improved.  Thinks that her pain is her only limiting factor but states that it is well controlled with the ketorolac that she received.  Objective: Vitals:   02/03/21 0300 02/03/21 0756 02/03/21 1132 02/03/21 1409  BP: (!) 141/74  121/63 (!) 105/51  Pulse: 81  81 78  Resp: 20  20 20   Temp: 99.3 F (37.4 C)  98.2 F (36.8 C) 98.1 F (36.7 C)  TempSrc: Oral  Oral Oral  SpO2: 94% 97% 99% 95%  Weight:      Height:       No intake or output data in the 24 hours ending 02/03/21 1950  Filed Weights   01/31/21 1739 02/02/21 0500  Weight: (!) 149.7 kg (!) 150.7 kg   Examination: Physical Exam:  Constitutional: WN/WD super morbidly obese Caucasian female currently in NAD and appears calm but a little uncomfortable sitting in the chair bedside Eyes: Lids and conjunctivae normal, sclerae anicteric  ENMT: External Ears, Nose appear normal. Grossly normal hearing. Mucous membranes are moist.  Neck: Appears normal, supple, no cervical masses, normal ROM, no appreciable thyromegaly; no appreciable JVD Respiratory: Diminished to auscultation bilaterally with coarse breath sounds, no wheezing, rales, rhonchi or crackles. Normal respiratory effort and patient is not tachypenic. No accessory muscle use.  Unlabored breathing and is wearing supplemental oxygen via nasal cannula Cardiovascular: RRR, no murmurs / rubs / gallops. S1 and S2 auscultated.  Has 1+ lower extremity edema Abdomen: Soft, non-tender,  distended secondary body habitus.  Bowel sounds positive.  GU: Deferred. Musculoskeletal: No clubbing / cyanosis of digits/nails. No joint deformity upper and lower extremities.  Skin: No rashes, lesions, ulcers on limited skin evaluation. No induration; Warm and dry.  Neurologic: CN 2-12 grossly intact with no focal deficits. Romberg sign and cerebellar reflexes not assessed.  Psychiatric: Normal judgment and insight. Alert and oriented x 3. Normal mood and appropriate affect.   Data Reviewed: I have personally reviewed following labs and imaging studies  CBC: Recent Labs  Lab 01/31/21 1829 02/01/21 0325 02/02/21 0124 02/03/21 0402  WBC 7.8 7.6 8.7 7.2  NEUTROABS  --   --  6.7 5.5  HGB 11.0* 11.2* 10.3* 10.7*  HCT 34.4* 36.2 33.2* 33.5*  MCV 93.7 95.8 93.8 93.3  PLT 203 208 204 250    Basic Metabolic Panel: Recent Labs  Lab 01/31/21 1829 02/01/21 0325 02/02/21 0124 02/03/21 0402  NA 138 137 138 136  K 4.1 4.3 4.3 4.0  CL 98 98 97* 98  CO2 29 30 28 25   GLUCOSE 115* 168* 131* 143*  BUN 23 19 29* 39*  CREATININE 0.96 0.96 1.24* 1.25*  CALCIUM 9.5 9.1 9.9 9.5  MG  --   --  1.8 1.8  PHOS  --   --  5.0* 4.6    GFR: Estimated Creatinine Clearance: 58.9 mL/min (A) (by C-G formula based on SCr of 1.25 mg/dL (H)). Liver Function Tests: Recent Labs  Lab 02/02/21 0124 02/03/21 0402  AST 20 19  ALT 18 20  ALKPHOS 44 42  BILITOT 0.7 0.7  PROT 7.1 7.1  ALBUMIN 3.4* 3.5    No results for input(s): LIPASE, AMYLASE in the last 168 hours. No results for input(s): AMMONIA in the last 168 hours. Coagulation Profile: No results for input(s): INR, PROTIME  in the last 168 hours. Cardiac Enzymes: No results for input(s): CKTOTAL, CKMB, CKMBINDEX, TROPONINI in the last 168 hours. BNP (last 3 results) No results for input(s): PROBNP in the last 8760 hours. HbA1C: Recent Labs    02/01/21 0325  HGBA1C 7.2*    CBG: Recent Labs  Lab 02/02/21 1625 02/02/21 2050  02/03/21 0736 02/03/21 1130 02/03/21 1625  GLUCAP 164* 232* 176* 181* 191*    Lipid Profile: Recent Labs    02/01/21 0325  CHOL 145  HDL 40*  LDLCALC 38  TRIG 337*  CHOLHDL 3.6    Thyroid Function Tests: No results for input(s): TSH, T4TOTAL, FREET4, T3FREE, THYROIDAB in the last 72 hours. Anemia Panel: Recent Labs    02/03/21 0402  VITAMINB12 796  FOLATE 26.4  FERRITIN 46  TIBC 402  IRON 44  RETICCTPCT 2.6   Sepsis Labs: No results for input(s): PROCALCITON, LATICACIDVEN in the last 168 hours.  Recent Results (from the past 240 hour(s))  Resp Panel by RT-PCR (Flu A&B, Covid) Nasopharyngeal Swab     Status: None   Collection Time: 01/31/21 11:02 PM   Specimen: Nasopharyngeal Swab; Nasopharyngeal(NP) swabs in vial transport medium  Result Value Ref Range Status   SARS Coronavirus 2 by RT PCR NEGATIVE NEGATIVE Final    Comment: (NOTE) SARS-CoV-2 target nucleic acids are NOT DETECTED.  The SARS-CoV-2 RNA is generally detectable in upper respiratory specimens during the acute phase of infection. The lowest concentration of SARS-CoV-2 viral copies this assay can detect is 138 copies/mL. A negative result does not preclude SARS-Cov-2 infection and should not be used as the sole basis for treatment or other patient management decisions. A negative result may occur with  improper specimen collection/handling, submission of specimen other than nasopharyngeal swab, presence of viral mutation(s) within the areas targeted by this assay, and inadequate number of viral copies(<138 copies/mL). A negative result must be combined with clinical observations, patient history, and epidemiological information. The expected result is Negative.  Fact Sheet for Patients:  EntrepreneurPulse.com.au  Fact Sheet for Healthcare Providers:  IncredibleEmployment.be  This test is no t yet approved or cleared by the [place] FDA and  has been  authorized for detection and/or diagnosis of SARS-CoV-2 by FDA under an Emergency Use Authorization (EUA). This EUA will remain  in effect (meaning this test can be used) for the duration of the COVID-19 declaration under Section 564(b)(1) of the Act, 21 U.S.C.section 360bbb-3(b)(1), unless the authorization is terminated  or revoked sooner.       Influenza A by PCR NEGATIVE NEGATIVE Final   Influenza B by PCR NEGATIVE NEGATIVE Final    Comment: (NOTE) The Xpert Xpress SARS-CoV-2/FLU/RSV plus assay is intended as an aid in the diagnosis of influenza from Nasopharyngeal swab specimens and should not be used as a sole basis for treatment. Nasal washings and aspirates are unacceptable for Xpert Xpress SARS-CoV-2/FLU/RSV testing.  Fact Sheet for Patients: EntrepreneurPulse.com.au  Fact Sheet for Healthcare Providers: IncredibleEmployment.be  This test is not yet approved or cleared by the [place] FDA and has been authorized for detection and/or diagnosis of SARS-CoV-2 by FDA under an Emergency Use Authorization (EUA). This EUA will remain in effect (meaning this test can be used) for the duration of the COVID-19 declaration under Section 564(b)(1) of the Act, 21 U.S.C. section 360bbb-3(b)(1), unless the authorization is terminated or revoked.  Performed at [facility], [place] [address], [place], [place] [address]      RN Pressure Injury  Documentation:     Estimated body mass index is 57.02 kg/m as calculated from the following:   Height as of this encounter: 5\' 4"  (1.626 m).   Weight as of this encounter: 150.7 kg.  Malnutrition Type:   Malnutrition Characteristics:   Nutrition Interventions:    Radiology Studies: DG CHEST PORT 1 VIEW  Result Date: 02/03/2021 CLINICAL DATA:  Shortness of breath.  Hypertension.  Diabetes. EXAM: PORTABLE CHEST 1 VIEW COMPARISON:  02/02/2021 FINDINGS: Midline trachea. Moderate cardiomegaly.  Mildly degraded exam due to AP portable technique and patient body habitus. No pleural effusion or pneumothorax. No congestive failure. Suspect mild bibasilar atelectasis. IMPRESSION: Cardiomegaly, without congestive failure. Limited evaluation of the lung bases; probable subsegmental atelectasis. Electronically Signed   By: Abigail Miyamoto M.D.   On: 02/03/2021 08:34   DG CHEST PORT 1 VIEW  Result Date: 02/02/2021 CLINICAL DATA:  Shortness of breath EXAM: PORTABLE CHEST 1 VIEW COMPARISON:  01/31/2021 FINDINGS: Unchanged AP portable examination. Cardiomegaly with pulmonary vascular prominence, however without overt edema. No new or focal airspace opacity. IMPRESSION: Cardiomegaly with pulmonary vascular prominence, however without overt edema. No new or focal airspace opacity. Electronically Signed   By: Delanna Ahmadi M.D.   On: 02/02/2021 08:51   ECHOCARDIOGRAM COMPLETE  Result Date: 02/02/2021    ECHOCARDIOGRAM REPORT   Patient Name:   [name] Date of Exam: 02/02/2021 Medical Rec #:  [id number]     Height:       64.0 in Accession #:    [id number]    Weight:       332.2 lb Date of Birth:  03-27-1945     BSA:          2.427 m Patient Age:    39 years      BP:           107/67 mmHg Patient Gender: F             HR:           85 bpm. Exam Location:  Inpatient Procedure: 2D Echo Indications:    CHF  History:        Patient has prior history of Echocardiogram examinations, most                 recent 09/05/2019. CHF; Risk Factors:Hypertension and Diabetes.  Sonographer:    Jefferey Pica Referring Phys: [id number] TIMOTHY S OPYD  Sonographer Comments: Technically difficult study due to poor echo windows and patient is morbidly obese. Unable to aquire all images due to body habitus and patients level of pain. IMPRESSIONS  1. Very limited study with poor echo windows. Definity contrast was administered.  2. Left ventricular ejection fraction, by estimation, is >75%. The left ventricle has hyperdynamic function. The  left ventricle has no regional wall motion abnormalities. Left ventricular diastolic function could not be evaluated.  3. Right ventricular systolic function was not well visualized. The right ventricular size is not well visualized.  4. The mitral valve was not well visualized. No evidence of mitral valve regurgitation.  5. The aortic valve was not well visualized. Aortic valve regurgitation is not visualized.  6. The inferior vena cava is normal in size with greater than 50% respiratory variability, suggesting right atrial pressure of 3 mmHg. Comparison(s): Changes from prior study are noted. 09/05/2019: LVEF 50-55%. FINDINGS  Left Ventricle: Left ventricular ejection fraction, by estimation, is >75%. The left ventricle has hyperdynamic function. The left ventricle has no regional wall motion abnormalities. Definity  contrast agent was given IV to delineate the left ventricular endocardial borders. The left ventricular internal cavity size was normal in size. Suboptimal image quality limits for assessment of left ventricular hypertrophy. Left ventricular diastolic function could not be evaluated. Right Ventricle: The right ventricular size is not well visualized. Right vetricular wall thickness was not well visualized. Right ventricular systolic function was not well visualized. Left Atrium: Left atrial size was not well visualized. Right Atrium: Right atrial size was not well visualized. Pericardium: There is no evidence of pericardial effusion. Mitral Valve: The mitral valve was not well visualized. No evidence of mitral valve regurgitation. Tricuspid Valve: The tricuspid valve is not well visualized. Tricuspid valve regurgitation is not demonstrated. Aortic Valve: The aortic valve was not well visualized. Aortic valve regurgitation is not visualized. Aortic valve peak gradient measures 18.3 mmHg. Pulmonic Valve: The pulmonic valve was not well visualized. Pulmonic valve regurgitation is not visualized. Aorta: The  aortic root and ascending aorta are structurally normal, with no evidence of dilitation. Venous: The inferior vena cava is normal in size with greater than 50% respiratory variability, suggesting right atrial pressure of 3 mmHg. IAS/Shunts: No atrial level shunt detected by color flow Doppler.  LEFT VENTRICLE PLAX 2D LVOT diam:     2.00 cm LV SV:         69 LV SV Index:   28 LVOT Area:     3.14 cm  IVC IVC diam: 2.00 cm AORTIC VALVE                 PULMONIC VALVE AV Area (Vmax): 1.79 cm     PV Vmax:       0.88 m/s AV Vmax:        214.00 cm/s  PV Peak grad:  3.1 mmHg AV Peak Grad:   18.3 mmHg LVOT Vmax:      122.00 cm/s LVOT Vmean:     73.000 cm/s LVOT VTI:       0.219 m  AORTA Ao Root diam: 3.30 cm Ao Asc diam:  3.00 cm MITRAL VALVE MV Area (PHT): 4.99 cm    SHUNTS MV Decel Time: 152 msec    Systemic VTI:  0.22 m MV E velocity: 88.80 cm/s  Systemic Diam: 2.00 cm MV A velocity: 57.30 cm/s MV E/A ratio:  1.55 Lyman Bishop MD Electronically signed by Lyman Bishop MD Signature Date/Time: 02/02/2021/11:25:09 AM    Final     Scheduled Meds:  amLODipine  5 mg Oral Daily   aspirin EC  81 mg Oral Daily   carvedilol  25 mg Oral BID WC   enoxaparin (LOVENOX) injection  40 mg Subcutaneous Daily   fluticasone furoate-vilanterol  1 puff Inhalation Daily   insulin aspart  0-15 Units Subcutaneous TID WC   insulin aspart  0-5 Units Subcutaneous QHS   insulin aspart  6 Units Subcutaneous TID WC   insulin glargine-yfgn  50 Units Subcutaneous BID   lisinopril  10 mg Oral Daily   melatonin  5 mg Oral QHS   polyethylene glycol  17 g Oral BID   ranolazine  500 mg Oral BID   rosuvastatin  20 mg Oral QHS   senna-docusate  1 tablet Oral BID   sodium chloride flush  3 mL Intravenous Q12H   umeclidinium bromide  1 puff Inhalation Daily   Continuous Infusions:  methocarbamol (ROBAXIN) IV 500 mg (02/01/21 2257)    LOS: 2 days   Kerney Elbe, DO [facility] PAGER is  on AMION  If 7PM-7AM, please  contact night-coverage [email or web address]

## 2021-02-03 NOTE — Progress Notes (Addendum)
Progress Note  Patient Name: [name] Date of Encounter: 02/03/2021  [facility] Cardiologist: Evalina Field, MD   Subjective   Denies chest pain, headaches, palpitations, lightheadedness. Denies any changes in her SOB, patient is wearing a nasal cannula with 3L oxygen (same amount as when she is at home). Patient has a history of episodic chest pain that occurs when she is stressed or anxious, not associated with physical exertion. Last episode of chest pain was on 1/14.   Inpatient Medications    Scheduled Meds:  amLODipine  5 mg Oral Daily   aspirin EC  81 mg Oral Daily   carvedilol  25 mg Oral BID WC   enoxaparin (LOVENOX) injection  40 mg Subcutaneous Daily   fluticasone furoate-vilanterol  1 puff Inhalation Daily   insulin aspart  0-15 Units Subcutaneous TID WC   insulin aspart  0-5 Units Subcutaneous QHS   insulin aspart  6 Units Subcutaneous TID WC   insulin glargine-yfgn  50 Units Subcutaneous BID   lisinopril  10 mg Oral Daily   melatonin  5 mg Oral QHS   ranolazine  500 mg Oral BID   rosuvastatin  20 mg Oral QHS   sodium chloride flush  3 mL Intravenous Q12H   umeclidinium bromide  1 puff Inhalation Daily   Continuous Infusions:  methocarbamol (ROBAXIN) IV 500 mg (02/01/21 2257)   PRN Meds: acetaminophen **OR** acetaminophen, albuterol, diclofenac Sodium, fentaNYL (SUBLIMAZE) injection, methocarbamol (ROBAXIN) IV   Vital Signs    Vitals:   02/02/21 0850 02/02/21 1103 02/02/21 2014 02/03/21 0300  BP:  117/67 121/79 (!) 141/74  Pulse: 86 70 76 81  Resp: 20 20 20 20   Temp:  98.1 F (36.7 C) 98.2 F (36.8 C) 99.3 F (37.4 C)  TempSrc:  Oral Oral Oral  SpO2: 95% 94% 97% 94%  Weight:      Height:       No intake or output data in the 24 hours ending 02/03/21 0744 Last 3 Weights 02/02/2021 01/31/2021 05/03/2020  Weight (lbs) 332 lb 3.2 oz 330 lb 333 lb 5.4 oz  Weight (kg) 150.685 kg 149.687 kg 151.2 kg      Telemetry    Sinus rhythm,  occasional PVCs, HR 70s-80s - Personally Reviewed  ECG    No new tracings since 1/14 - Personally Reviewed  Physical Exam   GEN: No acute distress.   Neck: Unable to assess JVD due to body habitus  Cardiac: RRR, no murmurs, rubs, or gallops.  Respiratory: Clear to auscultation bilaterally. GI: Soft, mildly tender to palpation throughout, non-distended  MS: 2+ nonpitting edema, no deformity. Bandage on left lower extremity  Neuro:  Nonfocal  Psych: Normal affect   Labs    High Sensitivity Troponin:   Recent Labs  Lab 01/31/21 1829 01/31/21 2129 02/01/21 0325  TROPONINIHS 24* 47* 81*     Chemistry Recent Labs  Lab 02/01/21 0325 02/02/21 0124 02/03/21 0402  NA 137 138 136  K 4.3 4.3 4.0  CL 98 97* 98  CO2 30 28 25   GLUCOSE 168* 131* 143*  BUN 19 29* 39*  CREATININE 0.96 1.24* 1.25*  CALCIUM 9.1 9.9 9.5  MG  --  1.8 1.8  PROT  --  7.1 7.1  ALBUMIN  --  3.4* 3.5  AST  --  20 19  ALT  --  18 20  ALKPHOS  --  44 42  BILITOT  --  0.7 0.7  GFRNONAA >60 46* 46*  ANIONGAP 9 13 13     Lipids  Recent Labs  Lab 02/01/21 0325  CHOL 145  TRIG 337*  HDL 40*  LDLCALC 38  CHOLHDL 3.6    Hematology Recent Labs  Lab 02/01/21 0325 02/02/21 0124 02/03/21 0402  WBC 7.6 8.7 7.2  RBC 3.78* 3.54* 3.59*   3.64*  HGB 11.2* 10.3* 10.7*  HCT 36.2 33.2* 33.5*  MCV 95.8 93.8 93.3  MCH 29.6 29.1 29.8  MCHC 30.9 31.0 31.9  RDW 16.7* 16.7* 16.5*  PLT 208 204 207   Thyroid No results for input(s): TSH, FREET4 in the last 168 hours.  BNP Recent Labs  Lab 01/31/21 1829  BNP 113.8*    DDimer No results for input(s): DDIMER in the last 168 hours.   Radiology    DG CHEST PORT 1 VIEW  Result Date: 02/02/2021 CLINICAL DATA:  Shortness of breath EXAM: PORTABLE CHEST 1 VIEW COMPARISON:  01/31/2021 FINDINGS: Unchanged AP portable examination. Cardiomegaly with pulmonary vascular prominence, however without overt edema. No new or focal airspace opacity. IMPRESSION:  Cardiomegaly with pulmonary vascular prominence, however without overt edema. No new or focal airspace opacity. Electronically Signed   By: Delanna Ahmadi M.D.   On: 02/02/2021 08:51   ECHOCARDIOGRAM COMPLETE  Result Date: 02/02/2021    ECHOCARDIOGRAM REPORT   Patient Name:   [name] Date of Exam: 02/02/2021 Medical Rec #:  [id number]     Height:       64.0 in Accession #:    [id number]    Weight:       332.2 lb Date of Birth:  08-17-1945     BSA:          2.427 m Patient Age:    76 years      BP:           107/67 mmHg Patient Gender: F             HR:           85 bpm. Exam Location:  Inpatient Procedure: 2D Echo Indications:    CHF  History:        Patient has prior history of Echocardiogram examinations, most                 recent 09/05/2019. CHF; Risk Factors:Hypertension and Diabetes.  Sonographer:    Jefferey Pica Referring Phys: [id number] TIMOTHY S OPYD  Sonographer Comments: Technically difficult study due to poor echo windows and patient is morbidly obese. Unable to aquire all images due to body habitus and patients level of pain. IMPRESSIONS  1. Very limited study with poor echo windows. Definity contrast was administered.  2. Left ventricular ejection fraction, by estimation, is >75%. The left ventricle has hyperdynamic function. The left ventricle has no regional wall motion abnormalities. Left ventricular diastolic function could not be evaluated.  3. Right ventricular systolic function was not well visualized. The right ventricular size is not well visualized.  4. The mitral valve was not well visualized. No evidence of mitral valve regurgitation.  5. The aortic valve was not well visualized. Aortic valve regurgitation is not visualized.  6. The inferior vena cava is normal in size with greater than 50% respiratory variability, suggesting right atrial pressure of 3 mmHg. Comparison(s): Changes from prior study are noted. 09/05/2019: LVEF 50-55%. FINDINGS  Left Ventricle: Left ventricular  ejection fraction, by estimation, is >75%. The left ventricle has hyperdynamic function. The left ventricle has no regional wall motion abnormalities. Definity contrast agent  was given IV to delineate the left ventricular endocardial borders. The left ventricular internal cavity size was normal in size. Suboptimal image quality limits for assessment of left ventricular hypertrophy. Left ventricular diastolic function could not be evaluated. Right Ventricle: The right ventricular size is not well visualized. Right vetricular wall thickness was not well visualized. Right ventricular systolic function was not well visualized. Left Atrium: Left atrial size was not well visualized. Right Atrium: Right atrial size was not well visualized. Pericardium: There is no evidence of pericardial effusion. Mitral Valve: The mitral valve was not well visualized. No evidence of mitral valve regurgitation. Tricuspid Valve: The tricuspid valve is not well visualized. Tricuspid valve regurgitation is not demonstrated. Aortic Valve: The aortic valve was not well visualized. Aortic valve regurgitation is not visualized. Aortic valve peak gradient measures 18.3 mmHg. Pulmonic Valve: The pulmonic valve was not well visualized. Pulmonic valve regurgitation is not visualized. Aorta: The aortic root and ascending aorta are structurally normal, with no evidence of dilitation. Venous: The inferior vena cava is normal in size with greater than 50% respiratory variability, suggesting right atrial pressure of 3 mmHg. IAS/Shunts: No atrial level shunt detected by color flow Doppler.  LEFT VENTRICLE PLAX 2D LVOT diam:     2.00 cm LV SV:         69 LV SV Index:   28 LVOT Area:     3.14 cm  IVC IVC diam: 2.00 cm AORTIC VALVE                 PULMONIC VALVE AV Area (Vmax): 1.79 cm     PV Vmax:       0.88 m/s AV Vmax:        214.00 cm/s  PV Peak grad:  3.1 mmHg AV Peak Grad:   18.3 mmHg LVOT Vmax:      122.00 cm/s LVOT Vmean:     73.000 cm/s LVOT VTI:        0.219 m  AORTA Ao Root diam: 3.30 cm Ao Asc diam:  3.00 cm MITRAL VALVE MV Area (PHT): 4.99 cm    SHUNTS MV Decel Time: 152 msec    Systemic VTI:  0.22 m MV E velocity: 88.80 cm/s  Systemic Diam: 2.00 cm MV A velocity: 57.30 cm/s MV E/A ratio:  1.55 Lyman Bishop MD Electronically signed by Lyman Bishop MD Signature Date/Time: 02/02/2021/11:25:09 AM    Final     Cardiac Studies   Echo 02/03/2020   1. Very limited study with poor echo windows. Definity contrast was  administered.   2. Left ventricular ejection fraction, by estimation, is >75%. The left  ventricle has hyperdynamic function. The left ventricle has no regional  wall motion abnormalities. Left ventricular diastolic function could not  be evaluated.   3. Right ventricular systolic function was not well visualized. The right  ventricular size is not well visualized.   4. The mitral valve was not well visualized. No evidence of mitral valve  regurgitation.   5. The aortic valve was not well visualized. Aortic valve regurgitation  is not visualized.   6. The inferior vena cava is normal in size with greater than 50%  respiratory variability, suggesting right atrial pressure of 3 mmHg.   Comparison(s): Changes from prior study are noted. 09/05/2019: LVEF 50-55%.   Echo 09/05/2019  1. Hypokinesis of the mid to apical inferior and inferolateral  myocardium. Left ventricular ejection fraction, by estimation, is 50 to  55%. The left ventricle has low  normal function. The left ventricle has no  regional wall motion abnormalities. Left  ventricular diastolic parameters are consistent with Grade II diastolic  dysfunction (pseudonormalization). Elevated left ventricular end-diastolic  pressure.   2. Right ventricular systolic function is normal. The right ventricular  size is normal.   3. The mitral valve is normal in structure. Trivial mitral valve  regurgitation. No evidence of mitral stenosis.   4. The aortic valve was not well  visualized. Aortic valve regurgitation  is trivial. No aortic stenosis is present.   5. The inferior vena cava is dilated in size with <50% respiratory  variability, suggesting right atrial pressure of 15 mmHg.   Patient Profile     76 y.o. female with PMH of chronic resp failure on LPM O2 since COVID 2020, HFpEP, HTN, DM II, OSA on CPAP, CAD with chronically occluded RCA 2015 (med therapy), morbid obesity who presented with chest pain and SOB.   Assessment & Plan    Acute on chronic diastolic heart failure  - Echo in 2021 showed EF 50-55%, trivial mitral valve regurg and AI  - Echo on 02/03/2020 showed EF >75%, hyperdynamic LV. Very limited study with poor echo windows  - I/O not accurate due to incontinence. No signs of pulmonary edema, body habitus makes it difficult to assess volume status  - Chest x-ray ordered, formal read pending  - Continue coreg, lisinopril  - Was previously being diuresed with IV Lasix, last dose was given 1/16 AM. Held due to bump in creatinine/BUN. Creatinine and BUN remain elevated this AM (1.25, 39 up from 1.24, 29)  - Plan to restart home torsemide when creatinine/BUN return to baseline - Patient continues to have some lower extremity edema, reports that this is chronic  - Will arrange outpatient follow up following discharge   Chest Pain  - History of chronically occluded RCA  - Patient reports chronic episodic chest pain that is associated with emotional stress, not physical exertion. Chest pain on 1/14 occurred when she was anxious  - HSTN 24>>47>>81 - Suspicion for ACS very low   HTN  - Continue home meds (amlodipine, coreg, lisinopril)   HLD  - Continue crestor 20 mg  - Lipid panel on 1/15 showed LDL 38, HDL 40   OSA on CPAP  - Encouraged CPAP use, patient reported good compliance    For questions or updates, please contact [facility] Please consult [email or web address] for contact info under        Signed, Margie Billet, PA-C   02/03/2021, 7:44 AM    Patient seen and examined   I agree with findings as noted by Sandria Senter above    Pt appears comfortable in chair   Just finished lunch Neck  No obvious JVD Lungs are CTA Cardiac exam:   RRR  Distant HS    Ext 1+ edema     Acute on chronic diastolic CHF  Pt clinically improved    Diuretic has been held due to bump in Cr  I would resume home torsemide (50 mg ) tomorrow     Pt will need to have lab work done early next week  (Monday)  HTN OK  On current meds   CAD   Pt with 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

## 2021-02-03 NOTE — Progress Notes (Signed)
Pt places self on/off cpap when ready.  RN will help attach o2 tubing or call RT for assistance.

## 2021-02-03 NOTE — Progress Notes (Signed)
Mobility Specialist Progress Note    02/03/21 1619  Mobility  Activity Refused mobility   Pt c/o hip pain.   [facility] Mobility Specialist  M.S. 2C and 6E: [phone] M.S. 4E: [phone]

## 2021-02-04 ENCOUNTER — Inpatient Hospital Stay (HOSPITAL_COMMUNITY): Payer: Medicare Other

## 2021-02-04 DIAGNOSIS — N179 Acute kidney failure, unspecified: Secondary | ICD-10-CM | POA: Diagnosis not present

## 2021-02-04 DIAGNOSIS — J9611 Chronic respiratory failure with hypoxia: Secondary | ICD-10-CM | POA: Diagnosis not present

## 2021-02-04 DIAGNOSIS — E1159 Type 2 diabetes mellitus with other circulatory complications: Secondary | ICD-10-CM | POA: Diagnosis not present

## 2021-02-04 DIAGNOSIS — I5033 Acute on chronic diastolic (congestive) heart failure: Secondary | ICD-10-CM | POA: Diagnosis not present

## 2021-02-04 LAB — CBC WITH DIFFERENTIAL/PLATELET
Abs Immature Granulocytes: 0.03 10*3/uL (ref 0.00–0.07)
Basophils Absolute: 0 10*3/uL (ref 0.0–0.1)
Basophils Relative: 1 %
Eosinophils Absolute: 0.2 10*3/uL (ref 0.0–0.5)
Eosinophils Relative: 2 %
HCT: 29 % — ABNORMAL LOW (ref 36.0–46.0)
Hemoglobin: 9.4 g/dL — ABNORMAL LOW (ref 12.0–15.0)
Immature Granulocytes: 1 %
Lymphocytes Relative: 12 %
Lymphs Abs: 0.8 10*3/uL (ref 0.7–4.0)
MCH: 29.9 pg (ref 26.0–34.0)
MCHC: 32.4 g/dL (ref 30.0–36.0)
MCV: 92.4 fL (ref 80.0–100.0)
Monocytes Absolute: 0.6 10*3/uL (ref 0.1–1.0)
Monocytes Relative: 9 %
Neutro Abs: 5 10*3/uL (ref 1.7–7.7)
Neutrophils Relative %: 75 %
Platelets: 173 10*3/uL (ref 150–400)
RBC: 3.14 MIL/uL — ABNORMAL LOW (ref 3.87–5.11)
RDW: 16.4 % — ABNORMAL HIGH (ref 11.5–15.5)
WBC: 6.6 10*3/uL (ref 4.0–10.5)
nRBC: 0 % (ref 0.0–0.2)

## 2021-02-04 LAB — COMPREHENSIVE METABOLIC PANEL
ALT: 17 U/L (ref 0–44)
AST: 17 U/L (ref 15–41)
Albumin: 3 g/dL — ABNORMAL LOW (ref 3.5–5.0)
Alkaline Phosphatase: 36 U/L — ABNORMAL LOW (ref 38–126)
Anion gap: 7 (ref 5–15)
BUN: 52 mg/dL — ABNORMAL HIGH (ref 8–23)
CO2: 26 mmol/L (ref 22–32)
Calcium: 8.7 mg/dL — ABNORMAL LOW (ref 8.9–10.3)
Chloride: 98 mmol/L (ref 98–111)
Creatinine, Ser: 1.5 mg/dL — ABNORMAL HIGH (ref 0.44–1.00)
GFR, Estimated: 37 mL/min — ABNORMAL LOW (ref >60)
Glucose, Bld: 117 mg/dL — ABNORMAL HIGH (ref 70–99)
Potassium: 4.1 mmol/L (ref 3.5–5.1)
Sodium: 131 mmol/L — ABNORMAL LOW (ref 135–145)
Total Bilirubin: 0.6 mg/dL (ref 0.3–1.2)
Total Protein: 5.9 g/dL — ABNORMAL LOW (ref 6.5–8.1)

## 2021-02-04 LAB — GLUCOSE, CAPILLARY
Glucose-Capillary: 148 mg/dL — ABNORMAL HIGH (ref 70–99)
Glucose-Capillary: 149 mg/dL — ABNORMAL HIGH (ref 70–99)
Glucose-Capillary: 182 mg/dL — ABNORMAL HIGH (ref 70–99)

## 2021-02-04 LAB — PHOSPHORUS: Phosphorus: 5.1 mg/dL — ABNORMAL HIGH (ref 2.5–4.6)

## 2021-02-04 LAB — MAGNESIUM: Magnesium: 2 mg/dL (ref 1.7–2.4)

## 2021-02-04 LAB — SARS CORONAVIRUS 2 (TAT 6-24 HRS): SARS Coronavirus 2: NEGATIVE

## 2021-02-04 MED ORDER — OXYCODONE HCL 5 MG PO TABS
5.0000 mg | ORAL_TABLET | ORAL | Status: DC | PRN
  Filled 2021-02-04: qty 1

## 2021-02-04 MED ORDER — ONDANSETRON HCL 4 MG/2ML IJ SOLN
4.0000 mg | Freq: Four times a day (QID) | INTRAMUSCULAR | Status: AC | PRN
  Administered 2021-02-04: 4 mg via INTRAVENOUS
  Filled 2021-02-04: qty 2

## 2021-02-04 MED ORDER — HYDROMORPHONE HCL 1 MG/ML IJ SOLN
0.5000 mg | INTRAMUSCULAR | Status: DC | PRN
  Administered 2021-02-04 (×3): 0.5 mg via INTRAVENOUS
  Filled 2021-02-04 (×3): qty 1

## 2021-02-04 MED ORDER — FERROUS SULFATE 325 (65 FE) MG PO TABS
325.0000 mg | ORAL_TABLET | Freq: Every day | ORAL | Status: DC
  Administered 2021-02-05: 325 mg via ORAL
  Filled 2021-02-04: qty 1

## 2021-02-04 MED ORDER — ONDANSETRON HCL 4 MG/2ML IJ SOLN
4.0000 mg | Freq: Once | INTRAMUSCULAR | Status: AC | PRN
  Administered 2021-02-04: 4 mg via INTRAVENOUS
  Filled 2021-02-04: qty 2

## 2021-02-04 MED ORDER — LISINOPRIL 10 MG PO TABS
10.0000 mg | ORAL_TABLET | Freq: Every day | ORAL | Status: DC
  Administered 2021-02-05: 10 mg via ORAL
  Filled 2021-02-04: qty 1

## 2021-02-04 MED ORDER — ONDANSETRON HCL 4 MG/2ML IJ SOLN
4.0000 mg | Freq: Four times a day (QID) | INTRAMUSCULAR | Status: DC | PRN
Start: 2021-02-04 — End: 2021-02-04

## 2021-02-04 NOTE — Progress Notes (Signed)
Physical Therapy Treatment Patient Details Name: [name] MRN: [id number] DOB: August 08, 1945 Today's Date: 02/04/2021   History of Present Illness 76 yo admitted 1/14 with SOB and edema. PMhx: covid 19 2020 with respiratory failure on 3L at home, HFpEF, DM, HTN, OSA on CPAP, morbid obesity, psoriasis    PT Comments    Pt sitting up in recliner since 3 am this morning, reports she has pivoted to [facility] x2 today. Pt refuses mobility with therapy, due to L hip pain, L shoulder pain and R sciatica. Performed limited LE exercise and educated on need to move from seated due to risk of pressure injury. Mobility tech to bring Bariatric recliner and Geomat to facilitate more ability to offweight ischial tuberosities. Pt verbalizes understanding. Pt report she will participate in therapy when her pain is better controlled. Pt reports that only the IV pain medication works for her. D/c plans remain appropriate at this time. PT will continue to follow acutely.    Recommendations for follow up therapy are one component of a multi-disciplinary discharge planning process, led by the attending physician.  Recommendations may be updated based on patient status, additional functional criteria and insurance authorization.  Follow Up Recommendations  Skilled nursing-short term rehab (<3 hours/day)     Assistance Recommended at Discharge Intermittent Supervision/Assistance  Patient can return home with the following Two people to help with walking and/or transfers;Two people to help with bathing/dressing/bathroom;Assistance with cooking/housework;Assistance with feeding;Direct supervision/assist for medications management;Direct supervision/assist for financial management;Assist for transportation         Precautions / Restrictions Precautions Precautions: Fall;Other (comment) Precaution Comments: urinary incontinence. PT reports pain left hip and left shoulder Restrictions Weight Bearing Restrictions: No      Mobility  Bed Mobility               General bed mobility comments: up in recliner, refusing mobility due to increased pain                            Balance Overall balance assessment: Needs assistance   Sitting balance-Leahy Scale: Fair                                      Cognition Arousal/Alertness: Awake/alert Behavior During Therapy: Flat affect Overall Cognitive Status: Impaired/Different from baseline Area of Impairment: Safety/judgement                         Safety/Judgement: Decreased awareness of safety, Decreased awareness of deficits              Exercises General Exercises - Lower Extremity Ankle Circles/Pumps: AROM, Right, 10 reps, Seated Long Arc Quad: AROM, Right, 5 reps, Seated Hip Flexion/Marching: AROM, Right, 5 reps, Seated Toe Raises: AROM, Both, 5 reps, Seated    General Comments  VSS on 3L O2 via [place]      Pertinent Vitals/Pain Pain Assessment Pain Assessment: 0-10 Pain Score: 9  Pain Location: left hip and shoulder Pain Descriptors / Indicators: Discomfort, Aching, Constant Pain Intervention(s): Limited activity within patient's tolerance, Monitored during session, Repositioned     PT Goals (current goals can now be found in the care plan section) Acute Rehab PT Goals PT Goal Formulation: With patient Time For Goal Achievement: 02/16/21 Potential to Achieve Goals: Fair Progress towards PT goals: Not progressing toward goals - comment  Frequency    Min 2X/week      PT Plan Current plan remains appropriate       AM-PAC PT "6 Clicks" Mobility   Outcome Measure  Help needed turning from your back to your side while in a flat bed without using bedrails?: A Lot Help needed moving from lying on your back to sitting on the side of a flat bed without using bedrails?: A Lot Help needed moving to and from a bed to a chair (including a wheelchair)?: A Lot Help needed standing up  from a chair using your arms (e.g., wheelchair or bedside chair)?: A Lot Help needed to walk in hospital room?: Total Help needed climbing 3-5 steps with a railing? : Total 6 Click Score: 10    End of Session Equipment Utilized During Treatment: Oxygen Activity Tolerance: Patient limited by pain Patient left: in chair;with call bell/phone within reach Nurse Communication: Mobility status PT Visit Diagnosis: Other abnormalities of gait and mobility (R26.89);Muscle weakness (generalized) (M62.81);Difficulty in walking, not elsewhere classified (R26.2)     Time: 6010-9323 PT Time Calculation (min) (ACUTE ONLY): 27 min  Charges:  $Therapeutic Exercise: 8-22 mins $Self Care/Home Management: 8-22                     [name] B. Migdalia Dk PT, DPT Acute Rehabilitation Services Pager [phone] Office [phone]    [place] 02/04/2021, 1:39 PM

## 2021-02-04 NOTE — TOC Progression Note (Signed)
Transition of Care [facility]) - Progression Note    Patient Details  Name: [name] MRN: [id number] Date of Birth: February 28, 1945  Transition of Care [facility]) CM/SW [place], [place] Phone Number: 02/04/2021, 1:35 PM  Clinical Narrative:     Patient has SNF bed at [facility] when medically ready for dc. CSW will continue to follow and assist with patients dc planning needs.   Expected Discharge Plan: [place] Barriers to Discharge: Continued Medical Work up  Expected Discharge Plan and Services Expected Discharge Plan: [place] In-house Referral: Clinical Social Work     Living arrangements for the past 2 months: [place]                                       Social Determinants of Health (SDOH) Interventions    Readmission Risk Interventions No flowsheet data found.

## 2021-02-04 NOTE — Progress Notes (Signed)
Pt has home CPAP at bedside. Checked water per pt's request. Pt states she is able to place CPAP on when ready for bed. Advised pt to notify for RT if any further assistance is needed.

## 2021-02-04 NOTE — Progress Notes (Signed)
Heart Failure Nurse Navigator Progress Note  Pt declined HV TOC clinic appt upon DC from hospitalization. Pt educated on benefits of HV TOC. Pt education complete regarding HF patient booklet. Plan to DC to SNF. Plans to go to her cardiology appt, does not want extra appointments at this time.  Pricilla Holm, MSN, RN Heart Failure Nurse Navigator [phone]

## 2021-02-04 NOTE — Progress Notes (Signed)
Mobility Specialist Progress Note    02/04/21 1622  Mobility  Activity Refused mobility   Pt c/o hip pain and wanting to lay down.   [facility] Mobility Specialist  M.S. 2C and 6E: [phone] M.S. 4E: [phone]

## 2021-02-04 NOTE — Progress Notes (Addendum)
Because of PROGRESS NOTE    [name]  KJZ:[id number] DOB: 02/16/1945 DOA: 01/31/2021 PCP: [name], NP   Brief Narrative:  The patient is a 76 year old morbidly obese Caucasian female with a past medical history significant for but not limited to chronic diastolic CHF, CAD, chronic hypoxic respiratory failure on 3 L of supplemental oxygen via nasal cannula, OSA on CPAP, hypertension, insulin-dependent diabetes mellitus type 2, history of pheochromocytoma status post right adrenalectomy and psoriasis who presented with several days of intermittent chest pain and increased leg swelling.  She was thought to have acute CHF.  Cardiology was consulted.  Patient was hospitalized for further management.     Assessment & Plan:   Acute on Chronic Diastolic CHF Echocardiogram done a few days ago revealed systolic function to be normal.  It was a limited study due to poor windows.  Patient was seen by cardiology.  Underwent diuresis with IV diuretics.  She diuresed well.  Started feeling better.   Furosemide was subsequently held once her creatinine started increasing. Continue to hold diuretics since creatinine continues to rise.  Placed lisinopril on hold for now. Continue strict ins and outs and daily weights.  Plan is to resume torsemide when renal function has stabilized.   Chest pain -Pt with hx of CAD s/p angioplasty in 1992 and LHC at [place] in 2015 with total occlusion of RCA with collaterals presents with chest pain  Had minimal elevation in troponin.  Seen by cardiology.  Medical management is being pursued. Continue aspirin, statin, Ranexa and beta-blocker.  AKI/hyponatremia Creatinine continues to climb.  Likely from diuresis.  Will discontinue lisinopril.  Diuretics remain on hold.  Avoid nephrotoxic agents.  Monitor urine output. Low sodium level noted today.  Likely from diuresis.  Recheck tomorrow.   Chronic Hypoxic respiratory failure; OSA  Uses 3 Lpm supplemental O2 since  suffering from COVID two yrs ago, also uses CPAP at home  Seems to be stable from respiratory standpoint. PT/OT recommending SNF and she is improved to be discharged to SNF once bed is available and insurance authorization is obtained  Left Leg Wound -WOC Nurse consulted    IDDM  A1c was 7.7% in April 2022 and repeat was 7.2 this Admission Continue glargine insulin along with SSI.  CBGs are reasonably well controlled.  Normocytic Anemia Anemia Panel showed an iron level of 44, U IBC 358, TIBC 402, saturation ratios of 11%, ferritin level of 46, folate level 26.4, vitamin B12 796 Could suggest a degree of iron deficiency.  No overt bleeding noted.  Start iron supplementation.  Left Side Hip pain -DG Pelvis showed "Degenerative changes without acute abnormality." -DG Femur showed "Degenerative changes of left hip joint are noted. No acute fracture or dislocation is noted. Degenerative changes in the knee joint are seen as well. No soft tissue abnormality is noted." Continue with muscle relaxant.  Noted to be on fentanyl.  We will change her to oxycodone as needed.  No NSAIDs due to worsening renal function.  Constipation Continue bowel regimen   Morbid Obesity -Complicates overall prognosis and care -Estimated body mass index is 56.91 kg/m as calculated from the following:   Height as of this encounter: 5\' 4"  (1.626 m).   Weight as of this encounter: 150.4 kg.    DVT prophylaxis: Enoxaparin 40 mg sq q24h Code Status: FULL CODE  Family Communication: No family present at bedside  Disposition Plan: SNF is recommended  Status is: Inpatient   The patient will require care  spanning > 2 midnights and should be moved to inpatient because: Still getting IV Diuresis and needs Cardiology Clearance    Consultants:  Cardiology   Procedures:  ECHOCARDIOGRAM IMPRESSIONS   1. Very limited study with poor echo windows. Definity contrast was  administered.   2. Left ventricular  ejection fraction, by estimation, is >75%. The left  ventricle has hyperdynamic function. The left ventricle has no regional  wall motion abnormalities. Left ventricular diastolic function could not  be evaluated.   3. Right ventricular systolic function was not well visualized. The right  ventricular size is not well visualized.   4. The mitral valve was not well visualized. No evidence of mitral valve  regurgitation.   5. The aortic valve was not well visualized. Aortic valve regurgitation  is not visualized.   6. The inferior vena cava is normal in size with greater than 50%  respiratory variability, suggesting right atrial pressure of 3 mmHg.   Comparison(s): Changes from prior study are noted. 09/05/2019: LVEF 50-55%.    Antimicrobials:  Anti-infectives (From admission, onward)    None        Subjective: Patient mentions that her shortness of breath is back to baseline.  Swelling in the legs is also improved.  Denies any chest pain.  Continues to have joint pain especially [place] on the right which is chronic for her.  Disappointed that she will not be discharged today.    Objective: Vitals:   02/03/21 2008 02/04/21 0455 02/04/21 0500 02/04/21 0804  BP: (!) 111/59 (!) 96/53  (!) 139/57  Pulse: 69 72    Resp: 20 20    Temp: 98 F (36.7 C) 98.5 F (36.9 C)    TempSrc: Oral Oral    SpO2: 97% 95%    Weight:   (!) 150.4 kg   Height:       No intake or output data in the 24 hours ending 02/04/21 1019  Filed Weights   01/31/21 1739 02/02/21 0500 02/04/21 0500  Weight: (!) 149.7 kg (!) 150.7 kg (!) 150.4 kg   Examination:  General appearance: Awake alert.  In no distress Resp: Clear to auscultation bilaterally.  Normal effort Cardio: S1-S2 is normal regular.  No S3-S4.  No rubs murmurs or bruit GI: Abdomen is soft.  Nontender nondistended.  Bowel sounds are present normal.  No masses organomegaly Extremities: Mild edema bilateral lower extremities Neurologic: Alert  and oriented x3.  No focal neurological deficits.      Data Reviewed: I have personally reviewed following labs and imaging studies  CBC: Recent Labs  Lab 01/31/21 1829 02/01/21 0325 02/02/21 0124 02/03/21 0402 02/04/21 0410  WBC 7.8 7.6 8.7 7.2 6.6  NEUTROABS  --   --  6.7 5.5 5.0  HGB 11.0* 11.2* 10.3* 10.7* 9.4*  HCT 34.4* 36.2 33.2* 33.5* 29.0*  MCV 93.7 95.8 93.8 93.3 92.4  PLT 203 208 204 207 660    Basic Metabolic Panel: Recent Labs  Lab 01/31/21 1829 02/01/21 0325 02/02/21 0124 02/03/21 0402 02/04/21 0410  NA 138 137 138 136 131*  K 4.1 4.3 4.3 4.0 4.1  CL 98 98 97* 98 98  CO2 29 30 28 25 26   GLUCOSE 115* 168* 131* 143* 117*  BUN 23 19 29* 39* 52*  CREATININE 0.96 0.96 1.24* 1.25* 1.50*  CALCIUM 9.5 9.1 9.9 9.5 8.7*  MG  --   --  1.8 1.8 2.0  PHOS  --   --  5.0* 4.6 5.1*  GFR: Estimated Creatinine Clearance: 49 mL/min (A) (by C-G formula based on SCr of 1.5 mg/dL (H)). Liver Function Tests: Recent Labs  Lab 02/02/21 0124 02/03/21 0402 02/04/21 0410  AST 20 19 17   ALT 18 20 17   ALKPHOS 44 42 36*  BILITOT 0.7 0.7 0.6  PROT 7.1 7.1 5.9*  ALBUMIN 3.4* 3.5 3.0*     CBG: Recent Labs  Lab 02/03/21 0736 02/03/21 1130 02/03/21 1625 02/03/21 2112 02/04/21 0745  GLUCAP 176* 181* 191* 185* 148*    Anemia Panel: Recent Labs    02/03/21 0402  VITAMINB12 796  FOLATE 26.4  FERRITIN 46  TIBC 402  IRON 44  RETICCTPCT 2.6     Recent Results (from the past 240 hour(s))  Resp Panel by RT-PCR (Flu A&B, Covid) Nasopharyngeal Swab     Status: None   Collection Time: 01/31/21 11:02 PM   Specimen: Nasopharyngeal Swab; Nasopharyngeal(NP) swabs in vial transport medium  Result Value Ref Range Status   SARS Coronavirus 2 by RT PCR NEGATIVE NEGATIVE Final    Comment: (NOTE) SARS-CoV-2 target nucleic acids are NOT DETECTED.  The SARS-CoV-2 RNA is generally detectable in upper respiratory specimens during the acute phase of infection. The  lowest concentration of SARS-CoV-2 viral copies this assay can detect is 138 copies/mL. A negative result does not preclude SARS-Cov-2 infection and should not be used as the sole basis for treatment or other patient management decisions. A negative result may occur with  improper specimen collection/handling, submission of specimen other than nasopharyngeal swab, presence of viral mutation(s) within the areas targeted by this assay, and inadequate number of viral copies(<138 copies/mL). A negative result must be combined with clinical observations, patient history, and epidemiological information. The expected result is Negative.  Fact Sheet for Patients:  EntrepreneurPulse.com.au  Fact Sheet for Healthcare Providers:  IncredibleEmployment.be  This test is no t yet approved or cleared by the [place] FDA and  has been authorized for detection and/or diagnosis of SARS-CoV-2 by FDA under an Emergency Use Authorization (EUA). This EUA will remain  in effect (meaning this test can be used) for the duration of the COVID-19 declaration under Section 564(b)(1) of the Act, 21 U.S.C.section 360bbb-3(b)(1), unless the authorization is terminated  or revoked sooner.       Influenza A by PCR NEGATIVE NEGATIVE Final   Influenza B by PCR NEGATIVE NEGATIVE Final    Comment: (NOTE) The Xpert Xpress SARS-CoV-2/FLU/RSV plus assay is intended as an aid in the diagnosis of influenza from Nasopharyngeal swab specimens and should not be used as a sole basis for treatment. Nasal washings and aspirates are unacceptable for Xpert Xpress SARS-CoV-2/FLU/RSV testing.  Fact Sheet for Patients: EntrepreneurPulse.com.au  Fact Sheet for Healthcare Providers: IncredibleEmployment.be  This test is not yet approved or cleared by the [place] FDA and has been authorized for detection and/or diagnosis of SARS-CoV-2 by FDA under  an Emergency Use Authorization (EUA). This EUA will remain in effect (meaning this test can be used) for the duration of the COVID-19 declaration under Section 564(b)(1) of the Act, 21 U.S.C. section 360bbb-3(b)(1), unless the authorization is terminated or revoked.  Performed at [facility], [place] [address], [place], [place] [address]        Radiology Studies: DG CHEST PORT 1 VIEW  Result Date: 02/04/2021 CLINICAL DATA:  Shortness of breath EXAM: PORTABLE CHEST 1 VIEW COMPARISON:  Chest radiograph 02/03/2021 FINDINGS: Heart is enlarged, unchanged.  The mediastinal contours are stable. Image quality is  again degraded by poor penetration. There is no new or worsening focal airspace disease. There is no overt pulmonary edema. There is no significant pleural effusion. There is no appreciable pneumothorax. There is no acute osseous abnormality. IMPRESSION: Unchanged cardiomegaly. No overt pulmonary edema or appreciable pleural effusion. Electronically Signed   By: Valetta Mole M.D.   On: 02/04/2021 09:14   DG CHEST PORT 1 VIEW  Result Date: 02/03/2021 CLINICAL DATA:  Shortness of breath.  Hypertension.  Diabetes. EXAM: PORTABLE CHEST 1 VIEW COMPARISON:  02/02/2021 FINDINGS: Midline trachea. Moderate cardiomegaly. Mildly degraded exam due to AP portable technique and patient body habitus. No pleural effusion or pneumothorax. No congestive failure. Suspect mild bibasilar atelectasis. IMPRESSION: Cardiomegaly, without congestive failure. Limited evaluation of the lung bases; probable subsegmental atelectasis. Electronically Signed   By: Abigail Miyamoto M.D.   On: 02/03/2021 08:34    Scheduled Meds:  amLODipine  5 mg Oral Daily   aspirin EC  81 mg Oral Daily   carvedilol  25 mg Oral BID WC   enoxaparin (LOVENOX) injection  40 mg Subcutaneous Daily   fluticasone furoate-vilanterol  1 puff Inhalation Daily   insulin aspart  0-15 Units Subcutaneous TID WC   insulin aspart  0-5 Units Subcutaneous  QHS   insulin aspart  6 Units Subcutaneous TID WC   insulin glargine-yfgn  50 Units Subcutaneous BID   [START ON 02/05/2021] lisinopril  10 mg Oral Daily   melatonin  5 mg Oral QHS   polyethylene glycol  17 g Oral BID   ranolazine  500 mg Oral BID   rosuvastatin  20 mg Oral QHS   senna-docusate  1 tablet Oral BID   sodium chloride flush  3 mL Intravenous Q12H   umeclidinium bromide  1 puff Inhalation Daily   Continuous Infusions:  methocarbamol (ROBAXIN) IV 500 mg (02/01/21 2257)    LOS: 3 days   [name],  [facility] PAGER is on AMION  If 7PM-7AM, please contact night-coverage [email or web address]

## 2021-02-05 LAB — BASIC METABOLIC PANEL
Anion gap: 9 (ref 5–15)
BUN: 54 mg/dL — ABNORMAL HIGH (ref 8–23)
CO2: 28 mmol/L (ref 22–32)
Calcium: 9.4 mg/dL (ref 8.9–10.3)
Chloride: 96 mmol/L — ABNORMAL LOW (ref 98–111)
Creatinine, Ser: 1.32 mg/dL — ABNORMAL HIGH (ref 0.44–1.00)
GFR, Estimated: 43 mL/min — ABNORMAL LOW (ref >60)
Glucose, Bld: 152 mg/dL — ABNORMAL HIGH (ref 70–99)
Potassium: 4.8 mmol/L (ref 3.5–5.1)
Sodium: 133 mmol/L — ABNORMAL LOW (ref 135–145)

## 2021-02-05 LAB — GLUCOSE, CAPILLARY
Glucose-Capillary: 177 mg/dL — ABNORMAL HIGH (ref 70–99)
Glucose-Capillary: 179 mg/dL — ABNORMAL HIGH (ref 70–99)
Glucose-Capillary: 180 mg/dL — ABNORMAL HIGH (ref 70–99)

## 2021-02-05 MED ORDER — METHOCARBAMOL 500 MG PO TABS: 500.0000 mg | ORAL_TABLET | Freq: Four times a day (QID) | ORAL | Status: DC | PRN

## 2021-02-05 MED ORDER — FERROUS SULFATE 325 (65 FE) MG PO TABS: 325.0000 mg | ORAL_TABLET | Freq: Every day | ORAL | 3 refills | Status: AC

## 2021-02-05 MED ORDER — HYDROCODONE-ACETAMINOPHEN 5-325 MG PO TABS
1.0000 | ORAL_TABLET | Freq: Four times a day (QID) | ORAL | Status: DC | PRN
  Administered 2021-02-05: 1 via ORAL
  Filled 2021-02-05: qty 1

## 2021-02-05 MED ORDER — HYDROCODONE-ACETAMINOPHEN 5-325 MG PO TABS: 1.0000 | ORAL_TABLET | Freq: Four times a day (QID) | ORAL | 0 refills | Status: DC | PRN

## 2021-02-05 MED ORDER — SENNOSIDES-DOCUSATE SODIUM 8.6-50 MG PO TABS: 1.0000 | ORAL_TABLET | Freq: Two times a day (BID) | ORAL | Status: AC

## 2021-02-05 MED ORDER — MELATONIN 5 MG PO TABS: 5.0000 mg | ORAL_TABLET | Freq: Every day | ORAL | 0 refills | Status: AC

## 2021-02-05 MED ORDER — ONDANSETRON HCL 4 MG PO TABS: 4.0000 mg | ORAL_TABLET | Freq: Three times a day (TID) | ORAL | 0 refills | Status: DC | PRN

## 2021-02-05 MED ORDER — TORSEMIDE 100 MG PO TABS: 50.0000 mg | ORAL_TABLET | Freq: Every day | ORAL | 0 refills | Status: DC

## 2021-02-05 MED ORDER — ONDANSETRON HCL 4 MG/2ML IJ SOLN
4.0000 mg | Freq: Four times a day (QID) | INTRAMUSCULAR | Status: DC | PRN
  Administered 2021-02-05: 4 mg via INTRAVENOUS
  Filled 2021-02-05: qty 2

## 2021-02-05 NOTE — TOC Transition Note (Signed)
Transition of Care [facility]) - CM/SW Discharge Note   Patient Details  Name: Meeyah Ovitt MRN: [id number] Date of Birth: 04-26-1945  Transition of [facility]) CM/SW Contact:  Bethann Berkshire, [place] Phone Number: 02/05/2021, 11:24 AM   Clinical Narrative:     Patient will DC to: [facility] Anticipated DC date: 02/05/21 Family notified: Keyia, [place] (Son)  [phone] [facility]) Transport by: Corey Harold   Per MD patient ready for DC to [facility]. RN, patient, patient's family, and facility notified of DC. Discharge Summary and FL2 sent to facility. RN to call report prior to discharge [phone]). DC packet on chart. Ambulance transport requested for patient.   CSW will sign off for now as social work intervention is no longer needed. Please consult [place] again if new needs arise.   Final next level of care: Skilled Nursing Facility Barriers to Discharge: No Barriers Identified   Patient Goals and CMS Choice Patient states their goals for this hospitalization and ongoing recovery are:: SNF CMS Medicare.gov Compare Post Acute Care list provided to:: Patient Choice offered to / list presented to : Patient  Discharge Placement              Patient chooses bed at: [facility] Patient to be transferred to facility by: [place] Name of family member notified: Maryssa, Giampietro (Son)   [phone] [facility]) Patient and family notified of of transfer: 02/05/21  Discharge Plan and Services In-house Referral: Clinical Social Work                                   Social Determinants of Health (SDOH) Interventions     Readmission Risk Interventions No flowsheet data found.

## 2021-02-05 NOTE — Plan of Care (Signed)

## 2021-02-05 NOTE — Progress Notes (Signed)
PTAR given d/c packet, patient left floor via Stret[facility]. Report called to SNF.  Shafter Jupin, Tivis Ringer, RN

## 2021-02-05 NOTE — Care Management Important Message (Signed)
Important Message  Patient Details  Name: [name] MRN: [id number] Date of Birth: [birth year]   Medicare Important Message Given:  Yes     [name] 02/05/2021, 7:31 AM

## 2021-02-05 NOTE — Discharge Summary (Signed)
[facility]  Physician Discharge Summary   Patient ID: [name] MRN: [id number] DOB/AGE: [birth year] 76 y.o.  Admit date: 01/31/2021 Discharge date:   02/05/2021   PCP: [name], NP  DISCHARGE DIAGNOSES:  Acute on chronic diastolic CHF Acute kidney injury, improved Insulin-dependent diabetes mellitus Chronic hypoxic respiratory failure Polyarthralgia including left hip pain and right-sided sciatica. Normocytic anemia Constipation  RECOMMENDATIONS FOR OUTPATIENT FOLLOW UP: Please check CBC and basic metabolic panel on 8/89/16 Cardiology has arranged outpatient follow-up   Home Health: Going to SNF Equipment/Devices: None  CODE STATUS: Full code  DISCHARGE CONDITION: fair  Diet recommendation: Modified carbohydrate  INITIAL HISTORY: 76 year old morbidly obese Caucasian female with a past medical history significant for but not limited to chronic diastolic CHF, CAD, chronic hypoxic respiratory failure on 3 L of supplemental oxygen via nasal cannula, OSA on CPAP, hypertension, insulin-dependent diabetes mellitus type 2, history of pheochromocytoma status post right adrenalectomy and psoriasis who presented with several days of intermittent chest pain and increased leg swelling.  She was thought to have acute CHF.  Cardiology was consulted.  Patient was hospitalized for further management.  Consultations: Cardiology  Procedures: Transthoracic echocardiogram   HOSPITAL COURSE:   Acute on Chronic Diastolic CHF Echocardiogram done a few days ago revealed systolic function to be normal.  It was a limited study due to poor windows.  Patient was seen by cardiology.  Underwent diuresis with IV diuretics.  Started feeling better.   Furosemide was subsequently held once her creatinine started increasing. Lisinopril was also placed on hold.  Creatinine is now down to 1.3.  Okay to resume her usual torsemide from tomorrow.  May also resume lisinopril.  Repeat  labs in next week.   Chest pain -Pt with hx of CAD s/p angioplasty in 1992 and LHC at [facility] in 2015 with total occlusion of RCA with collaterals presents with chest pain  Had minimal elevation in troponin.  Seen by cardiology.  Medical management is being pursued. Continue aspirin, statin, Ranexa and beta-blocker.   AKI/hyponatremia Rising creatinine was likely due to her diuretics.  Noted to improve this morning.  Patient reassured.  Sodium level is also improved.  Recommended checking labs early next week.  Continue to avoid nephrotoxic agents.    Chronic Hypoxic respiratory failure; OSA  Uses 3 Lpm supplemental O2 since suffering from COVID two yrs ago, also uses CPAP at home  Seems to be stable from respiratory standpoint.   Left Leg Wound As per wound care nurse: Dressing procedure/placement/frequency: Apply foam dressing to left posterior leg.  Hold in place with kerlex if needed.   IDDM  A1c was 7.7% in April 2022 and repeat was 7.2 this Admission.  Resume her usual home regimen.   Normocytic Anemia Anemia Panel showed an iron level of 44, U IBC 358, TIBC 402, saturation ratios of 11%, ferritin level of 46, folate level 26.4, vitamin B12 796 Could suggest a degree of iron deficiency.  No overt bleeding noted.  Started on iron supplementation.  Left Side Hip pain -DG Pelvis showed "Degenerative changes without acute abnormality." -DG Femur showed "Degenerative changes of left hip joint are noted. No acute fracture or dislocation is noted. Degenerative changes in the knee joint are seen as well. No soft tissue abnormality is noted." Noted to be on fentanyl which was changed over to oxycodone.  Patient mentions that she develops nausea with oxycodone.  Willing to try hydrocodone.  Continue with muscle relaxants.  No NSAIDs due to elevated creatinine.  Follow-up with PCP for further management.   Constipation Continue bowel regimen.    Morbid Obesity -Complicates overall prognosis  and care -Estimated body mass index is 56.91 kg/m as calculated from the following:   Height as of this encounter: 5\' 4"  (1.626 m).   Weight as of this encounter: 150.4 kg.     Patient is stable.  Okay for discharge to SNF today.   PERTINENT LABS:  The results of significant diagnostics from this hospitalization (including imaging, microbiology, ancillary and laboratory) are listed below for reference.    Microbiology: Recent Results (from the past 240 hour(s))  Resp Panel by RT-PCR (Flu A&B, Covid) Nasopharyngeal Swab     Status: None   Collection Time: 01/31/21 11:02 PM   Specimen: Nasopharyngeal Swab; Nasopharyngeal(NP) swabs in vial transport medium  Result Value Ref Range Status   SARS Coronavirus 2 by RT PCR NEGATIVE NEGATIVE Final    Comment: (NOTE) SARS-CoV-2 target nucleic acids are NOT DETECTED.  The SARS-CoV-2 RNA is generally detectable in upper respiratory specimens during the acute phase of infection. The lowest concentration of SARS-CoV-2 viral copies this assay can detect is 138 copies/mL. A negative result does not preclude SARS-Cov-2 infection and should not be used as the sole basis for treatment or other patient management decisions. A negative result may occur with  improper specimen collection/handling, submission of specimen other than nasopharyngeal swab, presence of viral mutation(s) within the areas targeted by this assay, and inadequate number of viral copies(<138 copies/mL). A negative result must be combined with clinical observations, patient history, and epidemiological information. The expected result is Negative.  Fact Sheet for Patients:  EntrepreneurPulse.com.au  Fact Sheet for Healthcare Providers:  IncredibleEmployment.be  This test is no t yet approved or cleared by the [place] FDA and  has been authorized for detection and/or diagnosis of SARS-CoV-2 by FDA under an Emergency Use Authorization  (EUA). This EUA will remain  in effect (meaning this test can be used) for the duration of the COVID-19 declaration under Section 564(b)(1) of the Act, 21 U.S.C.section 360bbb-3(b)(1), unless the authorization is terminated  or revoked sooner.       Influenza A by PCR NEGATIVE NEGATIVE Final   Influenza B by PCR NEGATIVE NEGATIVE Final    Comment: (NOTE) The Xpert Xpress SARS-CoV-2/FLU/RSV plus assay is intended as an aid in the diagnosis of influenza from Nasopharyngeal swab specimens and should not be used as a sole basis for treatment. Nasal washings and aspirates are unacceptable for Xpert Xpress SARS-CoV-2/FLU/RSV testing.  Fact Sheet for Patients: EntrepreneurPulse.com.au  Fact Sheet for Healthcare Providers: IncredibleEmployment.be  This test is not yet approved or cleared by the [place] FDA and has been authorized for detection and/or diagnosis of SARS-CoV-2 by FDA under an Emergency Use Authorization (EUA). This EUA will remain in effect (meaning this test can be used) for the duration of the COVID-19 declaration under Section 564(b)(1) of the Act, 21 U.S.C. section 360bbb-3(b)(1), unless the authorization is terminated or revoked.  Performed at [facility], [place] [address], [place], [place] [address]   SARS CORONAVIRUS 2 (TAT 6-24 HRS) Nasopharyngeal Nasopharyngeal Swab     Status: None   Collection Time: 02/04/21  1:36 PM   Specimen: Nasopharyngeal Swab  Result Value Ref Range Status   SARS Coronavirus 2 NEGATIVE NEGATIVE Final    Comment: (NOTE) SARS-CoV-2 target nucleic acids are NOT DETECTED.  The SARS-CoV-2 RNA is generally detectable in upper and lower respiratory specimens during the acute  phase of infection. Negative results do not preclude SARS-CoV-2 infection, do not rule out co-infections with other pathogens, and should not be used as the sole basis for treatment or other patient management  decisions. Negative results must be combined with clinical observations, patient history, and epidemiological information. The expected result is Negative.  Fact Sheet for Patients: SugarRoll.be  Fact Sheet for Healthcare Providers: https://www.woods-mathews.com/  This test is not yet approved or cleared by the [place] FDA and  has been authorized for detection and/or diagnosis of SARS-CoV-2 by FDA under an Emergency Use Authorization (EUA). This EUA will remain  in effect (meaning this test can be used) for the duration of the COVID-19 declaration under Se ction 564(b)(1) of the Act, 21 U.S.C. section 360bbb-3(b)(1), unless the authorization is terminated or revoked sooner.  Performed at [facility], [place] [address], [place],  [address]      Labs:  COVID-19 Labs  Recent Labs    02/03/21 0402  FERRITIN 81    Lab Results  Component Value Date   SARSCOV2NAA NEGATIVE 02/04/2021   [place] NEGATIVE 01/31/2021   [place] NEGATIVE 05/02/2020      Basic Metabolic Panel: Recent Labs  Lab 02/01/21 0325 02/02/21 0124 02/03/21 0402 02/04/21 0410 02/05/21 0116  NA 137 138 136 131* 133*  K 4.3 4.3 4.0 4.1 4.8  CL 98 97* 98 98 96*  CO2 30 28 25 26 28   GLUCOSE 168* 131* 143* 117* 152*  BUN 19 29* 39* 52* 54*  CREATININE 0.96 1.24* 1.25* 1.50* 1.32*  CALCIUM 9.1 9.9 9.5 8.7* 9.4  MG  --  1.8 1.8 2.0  --   PHOS  --  5.0* 4.6 5.1*  --    Liver Function Tests: Recent Labs  Lab 02/02/21 0124 02/03/21 0402 02/04/21 0410  AST 20 19 17   ALT 18 20 17   ALKPHOS 44 42 36*  BILITOT 0.7 0.7 0.6  PROT 7.1 7.1 5.9*  ALBUMIN 3.4* 3.5 3.0*    CBC: Recent Labs  Lab 01/31/21 1829 02/01/21 0325 02/02/21 0124 02/03/21 0402 02/04/21 0410  WBC 7.8 7.6 8.7 7.2 6.6  NEUTROABS  --   --  6.7 5.5 5.0  HGB 11.0* 11.2* 10.3* 10.7* 9.4*  HCT 34.4* 36.2 33.2* 33.5* 29.0*  MCV 93.7 95.8 93.8 93.3 92.4  PLT 203  208 204 207 173    BNP: BNP (last 3 results) Recent Labs    05/03/20 0749 05/04/20 0306 01/31/21 1829  BNP 69.5 69.0 113.8*     CBG: Recent Labs  Lab 02/04/21 0745 02/04/21 1127 02/04/21 1554 02/04/21 2130 02/05/21 0734  GLUCAP 148* 149* 182* 177* 179*     IMAGING STUDIES DG Chest 2 View  Result Date: 01/31/2021 CLINICAL DATA:  Chest pain EXAM: CHEST - 2 VIEW COMPARISON:  05/03/2020 FINDINGS: Stable cardiomegaly. Aortic atherosclerosis. Streaky bibasilar opacities. No overt edema. No pleural effusion or pneumothorax. IMPRESSION: Streaky bibasilar opacities, favor atelectasis. Electronically Signed   By: Davina Poke D.O.   On: 01/31/2021 19:17   DG Pelvis 1-2 Views  Result Date: 01/31/2021 CLINICAL DATA:  Pelvic pain particularly on the left, initial encounter EXAM: PELVIS - 1 VIEW COMPARISON:  None. FINDINGS: Pelvic ring is intact. Degenerative changes of the hip joints are noted bilaterally. No acute fracture or dislocation is seen. No soft tissue abnormality is noted. IMPRESSION: Degenerative changes without acute abnormality. Electronically Signed   By: Inez Catalina M.D.   On: 01/31/2021 19:13   DG CHEST PORT  1 VIEW  Result Date: 02/04/2021 CLINICAL DATA:  Shortness of breath EXAM: PORTABLE CHEST 1 VIEW COMPARISON:  Chest radiograph 02/03/2021 FINDINGS: Heart is enlarged, unchanged.  The mediastinal contours are stable. Image quality is again degraded by poor penetration. There is no new or worsening focal airspace disease. There is no overt pulmonary edema. There is no significant pleural effusion. There is no appreciable pneumothorax. There is no acute osseous abnormality. IMPRESSION: Unchanged cardiomegaly. No overt pulmonary edema or appreciable pleural effusion. Electronically Signed   By: Valetta Mole M.D.   On: 02/04/2021 09:14   DG CHEST PORT 1 VIEW  Result Date: 02/03/2021 CLINICAL DATA:  Shortness of breath.  Hypertension.  Diabetes. EXAM: PORTABLE CHEST 1  VIEW COMPARISON:  02/02/2021 FINDINGS: Midline trachea. Moderate cardiomegaly. Mildly degraded exam due to AP portable technique and patient body habitus. No pleural effusion or pneumothorax. No congestive failure. Suspect mild bibasilar atelectasis. IMPRESSION: Cardiomegaly, without congestive failure. Limited evaluation of the lung bases; probable subsegmental atelectasis. Electronically Signed   By: Abigail Miyamoto M.D.   On: 02/03/2021 08:34   DG CHEST PORT 1 VIEW  Result Date: 02/02/2021 CLINICAL DATA:  Shortness of breath EXAM: PORTABLE CHEST 1 VIEW COMPARISON:  01/31/2021 FINDINGS: Unchanged AP portable examination. Cardiomegaly with pulmonary vascular prominence, however without overt edema. No new or focal airspace opacity. IMPRESSION: Cardiomegaly with pulmonary vascular prominence, however without overt edema. No new or focal airspace opacity. Electronically Signed   By: Delanna Ahmadi M.D.   On: 02/02/2021 08:51   ECHOCARDIOGRAM COMPLETE  Result Date: 02/02/2021    ECHOCARDIOGRAM REPORT   Patient Name:   KOURTNEY TERRIQUEZ Date of Exam: 02/02/2021 Medical Rec #:  [id number]     Height:       64.0 in Accession #:    [id number]    Weight:       332.2 lb Date of Birth:  12-07-1945     BSA:          2.427 m Patient Age:    51 years      BP:           107/67 mmHg Patient Gender: F             HR:           85 bpm. Exam Location:  Inpatient Procedure: 2D Echo Indications:    CHF  History:        Patient has prior history of Echocardiogram examinations, most                 recent 09/05/2019. CHF; Risk Factors:Hypertension and Diabetes.  Sonographer:    Jefferey Pica Referring Phys: [id number] TIMOTHY S OPYD  Sonographer Comments: Technically difficult study due to poor echo windows and patient is morbidly obese. Unable to aquire all images due to body habitus and patients level of pain. IMPRESSIONS  1. Very limited study with poor echo windows. Definity contrast was administered.  2. Left ventricular ejection  fraction, by estimation, is >75%. The left ventricle has hyperdynamic function. The left ventricle has no regional wall motion abnormalities. Left ventricular diastolic function could not be evaluated.  3. Right ventricular systolic function was not well visualized. The right ventricular size is not well visualized.  4. The mitral valve was not well visualized. No evidence of mitral valve regurgitation.  5. The aortic valve was not well visualized. Aortic valve regurgitation is not visualized.  6. The inferior vena cava is normal in size with greater than 50%  respiratory variability, suggesting right atrial pressure of 3 mmHg. Comparison(s): Changes from prior study are noted. 09/05/2019: LVEF 50-55%. FINDINGS  Left Ventricle: Left ventricular ejection fraction, by estimation, is >75%. The left ventricle has hyperdynamic function. The left ventricle has no regional wall motion abnormalities. Definity contrast agent was given IV to delineate the left ventricular endocardial borders. The left ventricular internal cavity size was normal in size. Suboptimal image quality limits for assessment of left ventricular hypertrophy. Left ventricular diastolic function could not be evaluated. Right Ventricle: The right ventricular size is not well visualized. Right vetricular wall thickness was not well visualized. Right ventricular systolic function was not well visualized. Left Atrium: Left atrial size was not well visualized. Right Atrium: Right atrial size was not well visualized. Pericardium: There is no evidence of pericardial effusion. Mitral Valve: The mitral valve was not well visualized. No evidence of mitral valve regurgitation. Tricuspid Valve: The tricuspid valve is not well visualized. Tricuspid valve regurgitation is not demonstrated. Aortic Valve: The aortic valve was not well visualized. Aortic valve regurgitation is not visualized. Aortic valve peak gradient measures 18.3 mmHg. Pulmonic Valve: The pulmonic valve  was not well visualized. Pulmonic valve regurgitation is not visualized. Aorta: The aortic root and ascending aorta are structurally normal, with no evidence of dilitation. Venous: The inferior vena cava is normal in size with greater than 50% respiratory variability, suggesting right atrial pressure of 3 mmHg. IAS/Shunts: No atrial level shunt detected by color flow Doppler.  LEFT VENTRICLE PLAX 2D LVOT diam:     2.00 cm LV SV:         69 LV SV Index:   28 LVOT Area:     3.14 cm  IVC IVC diam: 2.00 cm AORTIC VALVE                 PULMONIC VALVE AV Area (Vmax): 1.79 cm     PV Vmax:       0.88 m/s AV Vmax:        214.00 cm/s  PV Peak grad:  3.1 mmHg AV Peak Grad:   18.3 mmHg LVOT Vmax:      122.00 cm/s LVOT Vmean:     73.000 cm/s LVOT VTI:       0.219 m  AORTA Ao Root diam: 3.30 cm Ao Asc diam:  3.00 cm MITRAL VALVE MV Area (PHT): 4.99 cm    SHUNTS MV Decel Time: 152 msec    Systemic VTI:  0.22 m MV E velocity: 88.80 cm/s  Systemic Diam: 2.00 cm MV A velocity: 57.30 cm/s MV E/A ratio:  1.55 Lyman Bishop MD Electronically signed by Lyman Bishop MD Signature Date/Time: 02/02/2021/11:25:09 AM    Final    DG Femur Min 2 Views Left  Result Date: 01/31/2021 CLINICAL DATA:  Left hip pain, no known injury, initial encounter EXAM: LEFT FEMUR 2 VIEWS COMPARISON:  None. FINDINGS: Degenerative changes of left hip joint are noted. No acute fracture or dislocation is noted. Degenerative changes in the knee joint are seen as well. No soft tissue abnormality is noted. IMPRESSION: Nerve changes without acute abnormality. Electronically Signed   By: Inez Catalina M.D.   On: 01/31/2021 19:13    DISCHARGE EXAMINATION: Vitals:   02/04/21 2146 02/05/21 0406 02/05/21 0500 02/05/21 0807  BP: 111/66 121/68    Pulse: 76 91    Resp: 18 20    Temp: 98.3 F (36.8 C) 98.5 F (36.9 C)    TempSrc: Oral Oral  SpO2: 96% 93%  98%  Weight:   (!) 150.5 kg   Height:       General appearance: Awake alert.  In no distress Resp:  Clear to auscultation bilaterally.  Normal effort Cardio: S1-S2 is normal regular.  No S3-S4.  No rubs murmurs or bruit GI: Abdomen is soft.  Nontender nondistended.  Bowel sounds are present normal.  No masses organomegaly    DISPOSITION: SNF  Discharge Instructions     (HEART FAILURE PATIENTS) Call MD:  Anytime you have any of the following symptoms: 1) 3 pound weight gain in 24 hours or 5 pounds in 1 week 2) shortness of breath, with or without a dry hacking cough 3) swelling in the hands, feet or stomach 4) if you have to sleep on extra pillows at night in order to breathe.   Complete by: As directed    Call MD for:  difficulty breathing, headache or visual disturbances   Complete by: As directed    Call MD for:  extreme fatigue   Complete by: As directed    Call MD for:  persistant dizziness or light-headedness   Complete by: As directed    Call MD for:  persistant nausea and vomiting   Complete by: As directed    Call MD for:  severe uncontrolled pain   Complete by: As directed    Call MD for:  temperature >100.4   Complete by: As directed    Diet - low sodium heart healthy   Complete by: As directed    Diet Carb Modified   Complete by: As directed    Heart Failure patients record your daily weight using the same scale at the same time of day   Complete by: As directed    Increase activity slowly   Complete by: As directed           Allergies as of 02/05/2021       Reactions   Metaxalone Anaphylaxis   Nitroglycerin    BP bottoms out   Tramadol-acetaminophen Anaphylaxis   Icosapent Ethyl Other (See Comments)   Stomach pain, gas, diarrhea, headache from Vascepa   Brompheniramine-pseudoeph    Feels drunk   Meprobamate    Metformin Nausea Only   "flushes"   Methyldopa    Other reaction(s): Unknown   Metoprolol    Other reaction(s): Unknown   Procaine    Passes out   Pseudoephedrine Hcl    "feel drunk"   Tramadol    "Made me go wonky, got fluid in my  lungs"        Medication List     STOP taking these medications    azithromycin 250 MG tablet Commonly known as: ZITHROMAX   meloxicam 7.5 MG tablet Commonly known as: MOBIC   Turmeric 500 MG Caps       TAKE these medications    acetaminophen 500 MG tablet Commonly known as: TYLENOL Take 500 mg by mouth every 8 (eight) hours as needed for moderate pain or headache. What changed: Another medication with the same name was removed. Continue taking this medication, and follow the directions you see here.   albuterol 108 (90 Base) MCG/ACT inhaler Commonly known as: VENTOLIN HFA Inhale 2 puffs into the lungs 4 (four) times daily as needed for wheezing or shortness of breath.   amLODipine 5 MG tablet Commonly known as: NORVASC Take 5 mg by mouth daily.   aspirin EC 81 MG tablet Take 81 mg by mouth daily. Swallow  whole.   Breo Ellipta 200-25 MCG/ACT Aepb Generic drug: fluticasone furoate-vilanterol Inhale 1 puff into the lungs daily.   carvedilol 25 MG tablet Commonly known as: COREG Take 25 mg by mouth 2 (two) times daily with a meal.   cetirizine 10 MG tablet Commonly known as: ZYRTEC Take 10 mg by mouth daily.   diclofenac Sodium 1 % Gel Commonly known as: VOLTAREN Apply 2-4 g topically 4 (four) times daily as needed (pain).   ferrous sulfate 325 (65 FE) MG tablet Take 1 tablet (325 mg total) by mouth daily with breakfast. Start taking on: February 06, 2021 What changed: when to take this   fluticasone 50 MCG/ACT nasal spray Commonly known as: FLONASE Place 1 spray into both nostrils daily as needed for allergies or rhinitis.   glucose 4 GM chewable tablet Chew 3 tablets by mouth as needed for low blood sugar.   glucose blood test strip as directed. Dispense Blood Glucose Test Strips to patient for home use. Testing 4 times daily.   HYDROcodone-acetaminophen 5-325 MG tablet Commonly known as: NORCO/VICODIN Take 1 tablet by mouth every 6 (six) hours as  needed for moderate pain.   insulin NPH-regular Human (70-30) 100 UNIT/ML injection Inject 73 Units into the skin with breakfast, with lunch, and with evening meal.   INSULIN SYRINGE 1CC/31GX5/16" 31G X 5/16" 1 ML Misc USE TO INJECT INSULIN TWO TIMES A DAY   Ixekizumab 80 MG/ML Soaj Inject into the skin. Give subcutaneous every 4 weeks   Lancets 33G Misc as directed. Testing 4 times daily.  Dispense Lancets to patient for home use.   lisinopril 10 MG tablet Commonly known as: ZESTRIL Take 10 mg by mouth daily.   melatonin 5 MG Tabs Take 1 tablet (5 mg total) by mouth at bedtime. What changed: See the new instructions.   methocarbamol 500 MG tablet Commonly known as: Robaxin Take 1 tablet (500 mg total) by mouth every 6 (six) hours as needed for muscle spasms.   MULTIVITAMIN ADULT PO Take 1 tablet by mouth daily.   ondansetron 4 MG tablet Commonly known as: Zofran Take 1 tablet (4 mg total) by mouth every 8 (eight) hours as needed for nausea or vomiting.   polyethylene glycol 17 g packet Commonly known as: MIRALAX / GLYCOLAX Take 17 g by mouth daily as needed for mild constipation. What changed: Another medication with the same name was removed. Continue taking this medication, and follow the directions you see here.   ranolazine 1000 MG SR tablet Commonly known as: RANEXA Take 500 mg by mouth 2 (two) times daily.   Respiratory Therapy Supplies [place] as directed. BIPAP 18/14 cm H2O with humidifier, mask, tubing, and headgear.   rosuvastatin 20 MG tablet Commonly known as: CRESTOR Take 20 mg by mouth at bedtime.   senna-docusate 8.6-50 MG tablet Commonly known as: Senokot-S Take 1 tablet by mouth 2 (two) times daily.   Spiriva HandiHaler 18 MCG inhalation capsule Generic drug: tiotropium Place 18 mcg into inhaler and inhale daily.   torsemide 100 MG tablet Commonly known as: DEMADEX Take 0.5 tablets (50 mg total) by mouth daily. Start taking on: February 06, 2021 What changed: See the new instructions.   vitamin B-12 1000 MCG tablet Commonly known as: CYANOCOBALAMIN Take 1,000 mcg by mouth daily.   Vitamin D3 125 MCG (5000 UT) Caps Take 5,000 Units by mouth.          Follow-up Information     [facility] Follow  up on 02/09/2021.   Specialty: Cardiology Why: Please present to the office for lab draw between 8 AM-3 PM, you do not need to be fasting Contact information: [place] [phone]        Almyra Deforest, [place] Follow up on 03/04/2021.   Specialties: Cardiology, Radiology Why: 11:20 with Almyra Deforest, cardiology PA Contact information: [address] [place] [address] [phone]                 [place]: 35 minutes  [place]  [facility] Pager on [email or web address]  02/05/2021, 9:12 AM

## 2021-02-05 NOTE — Progress Notes (Signed)
Report called to L.Clarke RN  [facility].  Chelise Hanger, Tivis Ringer, RN

## 2021-02-17 ENCOUNTER — Ambulatory Visit: Payer: Medicare Other | Admitting: Podiatry

## 2021-02-24 ENCOUNTER — Ambulatory Visit: Payer: Medicare Other | Admitting: Podiatry

## 2021-03-03 NOTE — Progress Notes (Signed)
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  Past Medical History    Past Medical History:  Diagnosis Date   Coronary artery disease    Diabetes mellitus without complication (HCC)    Hyperlipidemia    Hypertension    OSA (obstructive sleep apnea)    Past Surgical History:  Procedure Laterality Date   CARDIAC CATHETERIZATION     pheochromocytoma     TONSILLECTOMY      Allergies  Allergies  Allergen Reactions   Metaxalone Anaphylaxis   Nitroglycerin     BP bottoms out   Tramadol-Acetaminophen Anaphylaxis   Icosapent Ethyl Other (See Comments)    Stomach pain, gas, diarrhea, headache from Vascepa   Brompheniramine-Pseudoeph     Feels drunk   Meprobamate    Metformin Nausea Only    "flushes"   Methyldopa     Other reaction(s): Unknown   Metoprolol     Other reaction(s): Unknown   Procaine     Passes out   Pseudoephedrine Hcl     "feel drunk"   Tramadol     "Made me go wonky, got fluid in my lungs"    History of Present Illness    76 year old female with the above past medical history including CAD (CTO-RCA), chronic diastolic heart failure, hypertension, hyperlipidemia, type 2 diabetes, COPD, chronic respiratory failure requiring home O2 following COVID-19 infection, 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  She was initially evaluated by Dr. Audie Box in 2021 to establish care for management of CAD to relocating to [facility] from [facility]. 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 Cardiac  catheterization in 2015 showed chronic total occlusion to the RCA with left-to-right collaterals, managed medically. Echocardiogram in August 2021 showed EF 50 to 55%, hypokinesis of mid to apical inferior and inferolateral myocardium, low normal LV function, grade 2 diastolic dysfunction, aortic valve regurgitation. She was last seen in the office on 10/03/2019 and was stable overall from a cardiac standpoint.  She did have some chronic dyspnea on exertion in the setting of diastolic heart failure, symptoms improved with torsemide.  She presented to the ED on 01/31/2021 with complaints of shortness of breath. She was hospitalized from 01/31/2021 to 02/05/2021 in the setting of acute on chronic diastolic heart failure.  Troponin was mildly elevated in the setting of demand ischemia.  Cardiology was consulted.  Repeat echocardiogram showed EF greater than 75%, hyperdynamic LV, limited study with poor echo windows.  She was diuresed with IV Lasix, however this was subsequently held due to AKI. She reported chronic episode of chest pain associated with emotional stress, denied exertional symptoms. Troponin was minimally elevated.  With low suspicion for ACS and history of chronically occluded RCA, no further ischemic evaluation was recommended. Renal function improved and she was discharged to [facility] in stable condition on PO torsemide.  She presents today for follow-up.  Since her hospitalization reports an increase in dyspnea on exertion, unchanged lower extremity edema, and increased O2 requirements (she was discharged on 2 L O2 now requiring 4 L  O2).  She does continue to have intermittent chronic episodes of chest discomfort associated with emotional stress, denies exertional symptoms.  She is concerned that she is not feeling significantly better since her hospital discharge.  She states she had an ultrasound of her bilateral lower extremities yesterday at the skilled nursing facility given chronic  nonhealing left leg wound.  She states her dietary discretion is limited in the setting of skilled nursing facility as her meals are served to her. Other than her swelling and shortness of breath, she denies any other specific concerns or complaints today.  Home Medications    Current Outpatient Medications  Medication Sig Dispense Refill   acetaminophen (TYLENOL) 500 MG tablet Take 500 mg by mouth every 8 (eight) hours as needed for moderate pain or headache.     albuterol (VENTOLIN HFA) 108 (90 Base) MCG/ACT inhaler Inhale 2 puffs into the lungs 4 (four) times daily as needed for wheezing or shortness of breath.     amLODipine (NORVASC) 5 MG tablet Take 5 mg by mouth daily.     aspirin EC 81 MG tablet Take 81 mg by mouth daily. Swallow whole.     BREO ELLIPTA 200-25 MCG/INH AEPB Inhale 1 puff into the lungs daily.     carvedilol (COREG) 25 MG tablet Take 25 mg by mouth 2 (two) times daily with a meal.     cetirizine (ZYRTEC) 10 MG tablet Take 10 mg by mouth daily.     Cholecalciferol (VITAMIN D3) 125 MCG (5000 UT) CAPS Take 5,000 Units by mouth.     diclofenac Sodium (VOLTAREN) 1 % GEL Apply 2-4 g topically 4 (four) times daily as needed (pain).     ferrous sulfate 325 (65 FE) MG tablet Take 1 tablet (325 mg total) by mouth daily with breakfast.  3   fluticasone (FLONASE) 50 MCG/ACT nasal spray Place 1 spray into both nostrils daily as needed for allergies or rhinitis.     glucose 4 GM chewable tablet Chew 3 tablets by mouth as needed for low blood sugar.     glucose blood test strip as directed. Dispense Blood Glucose Test Strips to patient for home use. Testing 4 times daily.     HYDROcodone-acetaminophen (NORCO/VICODIN) 5-325 MG tablet Take 1 tablet by mouth every 6 (six) hours as needed for moderate pain. 20 tablet 0   insulin NPH-regular Human (70-30) 100 UNIT/ML injection Inject 73 Units into the skin with breakfast, with lunch, and with evening meal.     Insulin Syringe-Needle U-100  (INSULIN SYRINGE 1CC/31GX5/16") 31G X 5/16" 1 ML MISC USE TO INJECT INSULIN TWO TIMES A DAY     Ixekizumab 80 MG/ML SOAJ Inject into the skin. Give subcutaneous every 4 weeks     Lancets 33G MISC as directed. Testing 4 times daily.  Dispense Lancets to patient for home use.     lisinopril (ZESTRIL) 10 MG tablet Take 10 mg by mouth daily.     melatonin 5 MG TABS Take 1 tablet (5 mg total) by mouth at bedtime. 30 tablet 0   methocarbamol (ROBAXIN) 500 MG tablet Take 1 tablet (500 mg total) by mouth every 6 (six) hours as needed for muscle spasms.     Multiple Vitamin (MULTIVITAMIN ADULT PO) Take 1 tablet by mouth daily.     ondansetron (ZOFRAN) 4 MG tablet Take 1 tablet (4 mg total) by mouth every 8 (eight) hours as needed for nausea or vomiting. 20 tablet 0   polyethylene glycol (MIRALAX /  GLYCOLAX) 17 g packet Take 17 g by mouth daily as needed for mild constipation.     ranolazine (RANEXA) 1000 MG SR tablet Take 500 mg by mouth 2 (two) times daily.     Respiratory Therapy Supplies DEVI as directed. BIPAP 18/14 cm H2O with humidifier, mask, tubing, and headgear.     rosuvastatin (CRESTOR) 20 MG tablet Take 20 mg by mouth at bedtime.     senna-docusate (SENOKOT-S) 8.6-50 MG tablet Take 1 tablet by mouth 2 (two) times daily.     tiotropium (SPIRIVA HANDIHALER) 18 MCG inhalation capsule Place 18 mcg into inhaler and inhale daily.     torsemide (DEMADEX) 100 MG tablet Take 0.5 tablets (50 mg total) by mouth daily. 30 tablet 0   vitamin B-12 (CYANOCOBALAMIN) 1000 MCG tablet Take 1,000 mcg by mouth daily.     No current facility-administered medications for this visit.     Review of Systems    She denies palpitations, pnd, orthopnea, n, v, dizziness, syncope, weight gain, or early satiety. All other systems reviewed and are otherwise negative except as noted above.   Physical Exam    VS:  BP 99/61    Pulse 65    Ht 5\' 4"  (1.626 m)    Wt (!) 335 lb 9.6 oz (152.2 kg)    SpO2 99%    BMI 57.61 kg/m    GEN: Well nourished, well developed, in no acute distress. HEENT: normal. Neck: Supple, no JVD, carotid bruits, or masses. Cardiac: RRR, no murmurs, rubs, or gallops. No clubbing, cyanosis, edema.  Radials/DP/PT 2+ and equal bilaterally.  Respiratory:  Respirations regular and unlabored, clear to auscultation bilaterally. GI: Soft, nontender, nondistended, BS + x 4. MS: no deformity or atrophy. Skin: warm and dry, no rash. Neuro:  Strength and sensation are intact. Psych: Normal affect.  Wt Readings from Last 3 Encounters:  03/04/21 (!) 335 lb 9.6 oz (152.2 kg)  02/05/21 (!) 331 lb 12.7 oz (150.5 kg)  05/03/20 (!) 333 lb 5.4 oz (151.2 kg)     Accessory Clinical Findings    ECG personally reviewed by me today - Sinus rhythm with 1st degree AV block, 68 bpm, low voltage,  - no acute changes.  Lab Results  Component Value Date   WBC 6.6 02/04/2021   HGB 9.4 (L) 02/04/2021   HCT 29.0 (L) 02/04/2021   MCV 92.4 02/04/2021   PLT 173 02/04/2021   Lab Results  Component Value Date   CREATININE 1.32 (H) 02/05/2021   BUN 54 (H) 02/05/2021   NA 133 (L) 02/05/2021   K 4.8 02/05/2021   CL 96 (L) 02/05/2021   CO2 28 02/05/2021   Lab Results  Component Value Date   ALT 17 02/04/2021   AST 17 02/04/2021   ALKPHOS 36 (L) 02/04/2021   BILITOT 0.6 02/04/2021   Lab Results  Component Value Date   CHOL 145 02/01/2021   HDL 40 (L) 02/01/2021   LDLCALC 38 02/01/2021   TRIG 337 (H) 02/01/2021   CHOLHDL 3.6 02/01/2021    Lab Results  Component Value Date   HGBA1C 7.2 (H) 02/01/2021    Assessment & Plan   1. CAD: H/o MI in the 1990s, s/p angioplasty. Cardiac catheterization in 2015 showed chronic total occlusion to the RCA with left-to-right collaterals, managed medically. Prior echo 08/2019 showed EF 50 to 55%, hypokinesis of mid to apical inferior and inferolateral myocardium, low normal LV function, grade 2 diastolic dysfunction, aortic valve regurgitation.  Hospitalized in  January 2023 with shortness of breath.  Mildly elevated, no ischemic evaluation at the time.  Repeat echo showed EF greater than 75%, hyperdynamic LV, limited study with poor echo windows.  She reports stable chronic chest discomfort associated with stress, denies exertional symptoms.  Occasion for further ischemic evaluation at this time.  Continue aspirin, carvedilol, amlodipine, lisinopril, Ranexa, torsemide and Crestor.  2. Chronic diastolic heart failure: Recent echo as above.  She does have ongoing bilateral pitting lower extremity edema likely superimposed with dependent edema as she spends most of her time sitting in a wheelchair. This is unchanged since her hospitalization.  She does report increased dyspnea on exertion as well as increased O2 requirements, now requiring 4 L up from 2 L.  Denies increased in orthopnea, PND.  Weight is stable overall, 4 pounds since hospital discharge. Will check BMET, BNP today. Will increase torsemide to 50 mg twice daily for 3 days followed by resumption of torsemide 50 mg daily, with close follow-up. Low threshold for readmission given AKI/CKD, low BP, and fluid volume status. Recommend standing daily weights, sodium and fluid restriction. Otherwise, continue current medications as above.  3. Hypertension: BP runs low, denies dizziness, presyncope, syncope. Continue current antihypertensive regimen.  If BP too soft with additional diuresis, could consider cutting back on other antihypertensive medications to allow for additional diuresis.  4. AKI/CKD stage 3b by labs: AKI during recent hospitalization in January 2023.  Creatinine 1.32 at time of discharge.  Repeat BMET today and at follow-up visit.   5. Hyperlipidemia: LDL was 38 in January 2023.  Continue Crestor.  6. Chronic hypoxic respiratory failure: Occurred following COVID-19 infection.  On home O2 now requiring 4 L up from 2 L at hospital discharge.  Suspect acute on chronic heart failure could be  contributing to increased dyspnea.  Additional diuresis as above.  7. Disposition: Follow-up in 1 week.   Lenna Sciara, NP 03/04/2021, 1:00 PM

## 2021-03-04 ENCOUNTER — Other Ambulatory Visit: Payer: Self-pay

## 2021-03-04 ENCOUNTER — Ambulatory Visit (INDEPENDENT_AMBULATORY_CARE_PROVIDER_SITE_OTHER): Payer: Medicare Other | Admitting: Nurse Practitioner

## 2021-03-04 ENCOUNTER — Encounter: Payer: Self-pay | Admitting: Physician Assistant

## 2021-03-04 VITALS — BP 99/61 | HR 65 | Ht 64.0 in | Wt 335.6 lb

## 2021-03-04 DIAGNOSIS — I10 Essential (primary) hypertension: Secondary | ICD-10-CM | POA: Diagnosis not present

## 2021-03-04 DIAGNOSIS — E785 Hyperlipidemia, unspecified: Secondary | ICD-10-CM

## 2021-03-04 DIAGNOSIS — N183 Chronic kidney disease, stage 3 unspecified: Secondary | ICD-10-CM

## 2021-03-04 DIAGNOSIS — Z9981 Dependence on supplemental oxygen: Secondary | ICD-10-CM

## 2021-03-04 DIAGNOSIS — I5032 Chronic diastolic (congestive) heart failure: Secondary | ICD-10-CM | POA: Diagnosis not present

## 2021-03-04 DIAGNOSIS — I2510 Atherosclerotic heart disease of native coronary artery without angina pectoris: Secondary | ICD-10-CM | POA: Diagnosis not present

## 2021-03-04 DIAGNOSIS — J9611 Chronic respiratory failure with hypoxia: Secondary | ICD-10-CM

## 2021-03-04 NOTE — Patient Instructions (Addendum)
Medication Instructions:  Increase Torsemide 50 mg bid x 3 days then decrease to Torsemide 50 mg daily    *If you need a refill on your cardiac medications before your next appointment, please call your pharmacy*   Lab Work: Your physician recommends that you complete lab work today BMP   If you have labs (blood work) drawn today and your tests are completely normal, you will receive your results only by: [place] (if you have MyChart) OR A paper copy in the mail If you have any lab test that is abnormal or we need to change your treatment, we will call you to review the results.   Testing/Procedures: NONE ordered at this time of appointment     Follow-Up: At [facility], you and your health needs are our priority.  As part of our continuing mission to provide you with exceptional heart care, we have created designated Provider Care Teams.  These Care Teams include your primary Cardiologist (physician) and Advanced Practice Providers (APPs -  Physician Assistants and Nurse Practitioners) who all work together to provide you with the care you need, when you need it.  We recommend signing up for the patient portal called "MyChart".  Sign up information is provided on this After Visit Summary.  MyChart is used to connect with patients for Virtual Visits (Telemedicine).  Patients are able to view lab/test results, encounter notes, upcoming appointments, etc.  Non-urgent messages can be sent to your provider as well.   To learn more about what you can do with MyChart, go to [email or web address].    Your next appointment:   1 week(s)  The format for your next appointment:   In Person  Provider:   Almyra Deforest, PA-C        Other Instructions Please notify our office if you gain 3 pounds overnight or 5 pounds in 1 week.

## 2021-03-05 LAB — BASIC METABOLIC PANEL
BUN/Creatinine Ratio: 27 (ref 12–28)
BUN: 31 mg/dL — ABNORMAL HIGH (ref 8–27)
CO2: 29 mmol/L (ref 20–29)
Calcium: 9 mg/dL (ref 8.7–10.3)
Chloride: 95 mmol/L — ABNORMAL LOW (ref 96–106)
Creatinine, Ser: 1.16 mg/dL — ABNORMAL HIGH (ref 0.57–1.00)
Glucose: 141 mg/dL — ABNORMAL HIGH (ref 70–99)
Potassium: 4.5 mmol/L (ref 3.5–5.2)
Sodium: 142 mmol/L (ref 134–144)
eGFR: 50 mL/min/{1.73_m2} — ABNORMAL LOW (ref >59)

## 2021-03-05 LAB — BRAIN NATRIURETIC PEPTIDE

## 2021-03-10 ENCOUNTER — Ambulatory Visit (INDEPENDENT_AMBULATORY_CARE_PROVIDER_SITE_OTHER): Payer: Medicare Other | Admitting: Physician Assistant

## 2021-03-10 ENCOUNTER — Other Ambulatory Visit: Payer: Self-pay

## 2021-03-10 ENCOUNTER — Encounter: Payer: Self-pay | Admitting: Physician Assistant

## 2021-03-10 VITALS — BP 115/58 | HR 70 | Ht 63.0 in | Wt 335.8 lb

## 2021-03-10 DIAGNOSIS — I10 Essential (primary) hypertension: Secondary | ICD-10-CM | POA: Diagnosis not present

## 2021-03-10 DIAGNOSIS — J9611 Chronic respiratory failure with hypoxia: Secondary | ICD-10-CM | POA: Diagnosis not present

## 2021-03-10 DIAGNOSIS — I2510 Atherosclerotic heart disease of native coronary artery without angina pectoris: Secondary | ICD-10-CM | POA: Diagnosis not present

## 2021-03-10 DIAGNOSIS — I5032 Chronic diastolic (congestive) heart failure: Secondary | ICD-10-CM | POA: Diagnosis not present

## 2021-03-10 DIAGNOSIS — E119 Type 2 diabetes mellitus without complications: Secondary | ICD-10-CM

## 2021-03-10 DIAGNOSIS — E785 Hyperlipidemia, unspecified: Secondary | ICD-10-CM

## 2021-03-10 DIAGNOSIS — Z9981 Dependence on supplemental oxygen: Secondary | ICD-10-CM

## 2021-03-10 NOTE — Patient Instructions (Addendum)
Medication Instructions:  Increase Torsemide for 3 days and return to previous dose  *If you need a refill on your cardiac medications before your next appointment, please call your pharmacy*   Lab Work: Your physician recommends that you complete lab work today BMP BNP  If you have labs (blood work) drawn today and your tests are completely normal, you will receive your results only by: [place] (if you have MyChart) OR A paper copy in the mail If you have any lab test that is abnormal or we need to change your treatment, we will call you to review the results.   Testing/Procedures: NONE ordered at this time of appointment    Follow-Up: At [facility], you and your health needs are our priority.  As part of our continuing mission to provide you with exceptional heart care, we have created designated Provider Care Teams.  These Care Teams include your primary Cardiologist (physician) and Advanced Practice Providers (APPs -  Physician Assistants and Nurse Practitioners) who all work together to provide you with the care you need, when you need it.  We recommend signing up for the patient portal called "MyChart".  Sign up information is provided on this After Visit Summary.  MyChart is used to connect with patients for Virtual Visits (Telemedicine).  Patients are able to view lab/test results, encounter notes, upcoming appointments, etc.  Non-urgent messages can be sent to your provider as well.   To learn more about what you can do with MyChart, go to [email or web address].    Your next appointment:   1 month(s) 3 months  The format for your next appointment:   In Person  Provider:   Almyra Deforest, PA-C or Diona Browner, NP    Then, Evalina Field, MD will plan to see you again in 3 month(s).    Other Instructions Referral to Pulmonary sent

## 2021-03-10 NOTE — Progress Notes (Signed)
Cardiology Office Note:    Date:  03/11/2021   ID:  [name], DOB 03-09-1945, MRN [id number]  PCP:  [name], NP   [facility] Providers Cardiologist:  Evalina Field, MD     Referring MD: [name], NP   Chief Complaint  Patient presents with   Follow-up    Seen for Dr. Audie Box    History of Present Illness:    [name] is a 76 y.o. female with a hx of CAD s/p angioplasty 1443, chronic diastolic heart failure, HTN, HLD, DM II on insulin, COPD, pheochromocytoma s/p right adrenalectomy, morbid obesity, chronic respiratory failure on 3 L O2 since COVID infection 2020, and OSA on CPAP.  Cardiac catheterization in 2015 at [facility] revealed CTO of RCA with left-to-right collaterals, normal left system.  She is intolerant of nitroglycerin.  Previous echocardiogram obtained on 09/05/2018 with showed EF 50 to 55%, hypokinesis of the mid to apical inferior and inferolateral myocardium, grade 2 DD, dilated IVC, normal RV, trivial MR and the trivial AI.  She was started on torsemide 50 mg daily.  Her shortness of breath and chest discomfort improved with torsemide.  She was started on Vascepa for hypertriglyceridemia.  More recently, patient was admitted to the hospital on 01/31/2021 with worsening leg edema and intermittent chest discomfort.  She is chronically immobile at assisted living facility.  Serial troponin was minimally elevated however remained flat, suspicion for ACS was quite low and no ischemic work-up was recommended.  Patient underwent IV diuresis.  With IV diuresis, her creatinine trended up from 0.96 up to 1.5.  Diuresis held due to AKI.  Echocardiogram obtained on 02/02/2021 demonstrated EF greater than 75%, very limited study due to poor acoustic window despite the use of Definity contrast, trivial MR, trivial AI, RV normal, grade 2 DD.  She was resumed her home torsemide 50 mg after discharge.  Unfortunately, during the hospitalization, her I/O was not accurate, her  weight was also not accurate as her weight actually increased despite diuresis.  She was last seen by Diona Browner NP on 03/04/2021 at which point she complained of increasing dyspnea since discharge.  She continued to have lower extremity edema.  She also complained of increased O2 requirement, she was discharged on 2 L oxygen but now required 4 L oxygen.  She reported she had ultrasound of bilateral lower extremity at her skilled nursing facility.  We do not have the record.  We instructed her to increase torsemide to 50 mg twice a day for 3 days before transition back to 50 mg daily thereafter.  She presents back today for follow-up.  Blood work obtained on 03/04/2021 showed creatinine 1.16, potassium 4.5.  Patient presents today for follow-up.  In the last 2 days, she feels her breathing is a little bit worse.  However she did feel better after being on the higher dose of torsemide last time for 3 days.  On physical exam, she her lung is clear.  She continues to have very taunt and swollen lower extremity on both side.  Her left lower extremity is under wrap due to wound.  Per patient report, her facility PA is referring the patient to a wound care center.  After her lower extremity wound resolved, she can get back to water aerobic activity.  Unfortunately, I suspect her chronic respiratory failure is due to combination of obesity hypoventilation syndrome, diastolic heart failure and lung issue as well.  She says her breathing has been worse since her  COVID infection in 2020.  I will refer her to pulmonology service for evaluation.  She does not have a previous CT of chest in the past.  I recommend a basic metabolic panel and BMP today.  She can follow-up with APP in 1 month and Dr. Audie Box in 3 months.   Past Medical History:  Diagnosis Date   Coronary artery disease    Diabetes mellitus without complication (HCC)    Hyperlipidemia    Hypertension    OSA (obstructive sleep apnea)     Past Surgical  History:  Procedure Laterality Date   CARDIAC CATHETERIZATION     pheochromocytoma     TONSILLECTOMY      Current Medications: Current Meds  Medication Sig   acetaminophen (TYLENOL) 500 MG tablet Take 500 mg by mouth every 8 (eight) hours as needed for moderate pain or headache.   amLODipine (NORVASC) 5 MG tablet Take 5 mg by mouth daily.   aspirin EC 81 MG tablet Take 81 mg by mouth daily. Swallow whole.   BREO ELLIPTA 200-25 MCG/INH AEPB Inhale 1 puff into the lungs daily.   carvedilol (COREG) 25 MG tablet Take 25 mg by mouth 2 (two) times daily with a meal.   cetirizine (ZYRTEC) 10 MG tablet Take 10 mg by mouth daily.   Cholecalciferol (VITAMIN D3) 125 MCG (5000 UT) CAPS Take 5,000 Units by mouth.   diclofenac Sodium (VOLTAREN) 1 % GEL Apply 2-4 g topically 4 (four) times daily as needed (pain).   ferrous sulfate 325 (65 FE) MG tablet Take 1 tablet (325 mg total) by mouth daily with breakfast.   glucose 4 GM chewable tablet Chew 3 tablets by mouth as needed for low blood sugar.   glucose blood test strip as directed. Dispense Blood Glucose Test Strips to patient for home use. Testing 4 times daily.   HYDROcodone-acetaminophen (NORCO/VICODIN) 5-325 MG tablet Take 1 tablet by mouth every 6 (six) hours as needed for moderate pain.   insulin NPH-regular Human (70-30) 100 UNIT/ML injection Inject 73 Units into the skin with breakfast, with lunch, and with evening meal.   Insulin Syringe-Needle U-100 (INSULIN SYRINGE 1CC/31GX5/16") 31G X 5/16" 1 ML MISC USE TO INJECT INSULIN TWO TIMES A DAY   Ixekizumab 80 MG/ML SOAJ Inject into the skin. Give subcutaneous every 4 weeks   Lancets 33G MISC as directed. Testing 4 times daily.  Dispense Lancets to patient for home use.   lisinopril (ZESTRIL) 10 MG tablet Take 10 mg by mouth daily.   melatonin 5 MG TABS Take 1 tablet (5 mg total) by mouth at bedtime.   Multiple Vitamin (MULTIVITAMIN ADULT PO) Take 1 tablet by mouth daily.   polyethylene glycol  (MIRALAX / GLYCOLAX) 17 g packet Take 17 g by mouth daily as needed for mild constipation.   ranolazine (RANEXA) 1000 MG SR tablet Take 500 mg by mouth 2 (two) times daily.   Respiratory Therapy Supplies DEVI as directed. BIPAP 18/14 cm H2O with humidifier, mask, tubing, and headgear.   rosuvastatin (CRESTOR) 20 MG tablet Take 20 mg by mouth at bedtime.   senna-docusate (SENOKOT-S) 8.6-50 MG tablet Take 1 tablet by mouth 2 (two) times daily.   tiotropium (SPIRIVA HANDIHALER) 18 MCG inhalation capsule Place 18 mcg into inhaler and inhale daily.   torsemide (DEMADEX) 100 MG tablet Take 0.5 tablets (50 mg total) by mouth daily.   vitamin B-12 (CYANOCOBALAMIN) 1000 MCG tablet Take 1,000 mcg by mouth daily.     Allergies:  Metaxalone, Nitroglycerin, Tramadol-acetaminophen, Icosapent ethyl, Brompheniramine-pseudoeph, Meprobamate, Metformin, Methyldopa, Metoprolol, Procaine, Pseudoephedrine hcl, and Tramadol   Social History   Socioeconomic History   Marital status: Widowed    Spouse name: Not on file   Number of children: Not on file   Years of education: Not on file   Highest education level: Not on file  Occupational History   Occupation: retired  Tobacco Use   Smoking status: Former    Packs/day: 2.00    Years: 10.00    Pack years: 20.00    Types: Cigarettes    Quit date: 08/15/1989    Years since quitting: 31.5   Smokeless tobacco: Never  Substance and Sexual Activity   Alcohol use: Not Currently   Drug use: Not Currently   Sexual activity: Not on file  Other Topics Concern   Not on file  Social History Narrative   Not on file   Social Determinants of Health   Financial Resource Strain: Not on file  Food Insecurity: Not on file  Transportation Needs: Not on file  Physical Activity: Not on file  Stress: Not on file  Social Connections: Not on file     Family History: The patient's family history includes Heart attack in her father and mother; Stroke in her  mother.  ROS:   Please see the history of present illness.     All other systems reviewed and are negative.  EKGs/Labs/Other Studies Reviewed:    The following studies were reviewed today:  Echo 02/02/2021 1. Very limited study with poor echo windows. Definity contrast was  administered.   2. Left ventricular ejection fraction, by estimation, is >75%. The left  ventricle has hyperdynamic function. The left ventricle has no regional  wall motion abnormalities. Left ventricular diastolic function could not  be evaluated.   3. Right ventricular systolic function was not well visualized. The right  ventricular size is not well visualized.   4. The mitral valve was not well visualized. No evidence of mitral valve  regurgitation.   5. The aortic valve was not well visualized. Aortic valve regurgitation  is not visualized.   6. The inferior vena cava is normal in size with greater than 50%  respiratory variability, suggesting right atrial pressure of 3 mmHg.   Comparison(s): Changes from prior study are noted. 09/05/2019: LVEF 50-55%.   EKG:  EKG is not ordered today.   Recent Labs: 02/04/2021: ALT 17; Hemoglobin 9.4; Magnesium 2.0; Platelets 173 03/10/2021: BNP 68.4; BUN WILL FOLLOW; Creatinine, Ser WILL FOLLOW; Potassium WILL FOLLOW; Sodium WILL FOLLOW  Recent Lipid Panel    Component Value Date/Time   CHOL 145 02/01/2021 0325   TRIG 337 (H) 02/01/2021 0325   HDL 40 (L) 02/01/2021 0325   CHOLHDL 3.6 02/01/2021 0325   VLDL 67 (H) 02/01/2021 0325   LDLCALC 38 02/01/2021 0325     Risk Assessment/Calculations:           Physical Exam:    VS:  BP (!) 115/58 (BP Location: Left Arm, Patient Position: Sitting, Cuff Size: Large)    Pulse 70    Ht 5\' 3"  (1.6 m)    Wt (!) 335 lb 12.8 oz (152.3 kg)    SpO2 98%    BMI 59.48 kg/m     Wt Readings from Last 3 Encounters:  03/10/21 (!) 335 lb 12.8 oz (152.3 kg)  03/04/21 (!) 335 lb 9.6 oz (152.2 kg)  02/05/21 (!) 331 lb 12.7 oz (150.5  kg)  GEN:  Well nourished, well developed in no acute distress HEENT: Normal NECK: No JVD; No carotid bruits LYMPHATICS: No lymphadenopathy CARDIAC: RRR, no murmurs, rubs, gallops RESPIRATORY:  Clear to auscultation without rales, wheezing or rhonchi  ABDOMEN: Soft, non-tender, non-distended MUSCULOSKELETAL:  No edema; No deformity  SKIN: Warm and dry NEUROLOGIC:  Alert and oriented x 3 PSYCHIATRIC:  Normal affect   ASSESSMENT:    1. Chronic diastolic heart failure ([place])   2. Chronic respiratory failure with hypoxia, on home oxygen therapy (HCC)   3. Coronary artery disease involving native coronary artery of native heart without angina pectoris   4. Primary hypertension   5. Hyperlipidemia LDL goal <70   6. Controlled type 2 diabetes mellitus without complication, without long-term current use of insulin (HCC)    PLAN:    In order of problems listed above:  Chronic diastolic heart failure: Her breathing improved after temporarily increasing torsemide to 50 mg 3 times daily for 3 days after the last visit.  In the past 2 days, her shortness of breath worsened slightly, her facility is planning to be increase torsemide again for 3 days before going back to the previous dose.  I am hesitant to permanently increase her diuretic dosage as she is already on a very hefty dose.  Her morbid obesity make it hard to assess her true volume.  She has chronic lower extremity edema.  Chronic respiratory failure: Likely due to combination of obesity hypoventilation syndrome, chronic diastolic heart failure and the likely lung issue as well.  For some reason, she has not been established with a local pulmonologist.  She was previously seen by Dr. Fuller Song at [facility] for obstructive sleep apnea.  Last visit was in 2021.  We will refer the patient to pulmonology service.  She remains on 4 L nasal cannula  CAD: History of occluded RCA seen on previous cardiac catheterization in 2015.  He denies any  recent chest pain  Hypertension: Blood pressure stable  Hyperlipidemia: On Crestor  DM2: Managed by primary care provider.            Medication Adjustments/Labs and Tests Ordered: Current medicines are reviewed at length with the patient today.  Concerns regarding medicines are outlined above.  Orders Placed This Encounter  Procedures   Basic metabolic panel   Brain natriuretic peptide   Specimen status report   Ambulatory referral to Pulmonology   No orders of the defined types were placed in this encounter.   Patient Instructions  Medication Instructions:  Increase Torsemide for 3 days and return to previous dose  *If you need a refill on your cardiac medications before your next appointment, please call your pharmacy*   Lab Work: Your physician recommends that you complete lab work today BMP BNP  If you have labs (blood work) drawn today and your tests are completely normal, you will receive your results only by: [place] (if you have MyChart) OR A paper copy in the mail If you have any lab test that is abnormal or we need to change your treatment, we will call you to review the results.   Testing/Procedures: NONE ordered at this time of appointment    Follow-Up: At [facility], you and your health needs are our priority.  As part of our continuing mission to provide you with exceptional heart care, we have created designated Provider Care Teams.  These Care Teams include your primary Cardiologist (physician) and Advanced Practice Providers (APPs -  Physician Assistants and  Nurse Practitioners) who all work together to provide you with the care you need, when you need it.  We recommend signing up for the patient portal called "MyChart".  Sign up information is provided on this After Visit Summary.  MyChart is used to connect with patients for Virtual Visits (Telemedicine).  Patients are able to view lab/test results, encounter notes, upcoming  appointments, etc.  Non-urgent messages can be sent to your provider as well.   To learn more about what you can do with MyChart, go to [email or web address].    Your next appointment:   1 month(s) 3 months  The format for your next appointment:   In Person  Provider:   Almyra Deforest, PA-C or Diona Browner, NP    Then, Evalina Field, MD will plan to see you again in 3 month(s).    Other Instructions Referral to Pulmonary sent    Signed, Almyra Deforest, [place]  03/11/2021 10:53 PM    [facility]

## 2021-03-11 ENCOUNTER — Encounter: Payer: Self-pay | Admitting: Physician Assistant

## 2021-03-11 LAB — BASIC METABOLIC PANEL

## 2021-03-11 LAB — SPECIMEN STATUS REPORT

## 2021-03-13 ENCOUNTER — Other Ambulatory Visit: Payer: Self-pay

## 2021-03-13 DIAGNOSIS — Z79899 Other long term (current) drug therapy: Secondary | ICD-10-CM

## 2021-03-13 DIAGNOSIS — I5032 Chronic diastolic (congestive) heart failure: Secondary | ICD-10-CM

## 2021-03-16 ENCOUNTER — Ambulatory Visit (INDEPENDENT_AMBULATORY_CARE_PROVIDER_SITE_OTHER): Payer: Medicare Other | Admitting: Podiatry

## 2021-03-16 ENCOUNTER — Other Ambulatory Visit: Payer: Self-pay

## 2021-03-16 DIAGNOSIS — B351 Tinea unguium: Secondary | ICD-10-CM

## 2021-03-16 DIAGNOSIS — L03031 Cellulitis of right toe: Secondary | ICD-10-CM | POA: Diagnosis not present

## 2021-03-16 DIAGNOSIS — L97922 Non-pressure chronic ulcer of unspecified part of left lower leg with fat layer exposed: Secondary | ICD-10-CM

## 2021-03-16 DIAGNOSIS — M79674 Pain in right toe(s): Secondary | ICD-10-CM

## 2021-03-16 DIAGNOSIS — E1142 Type 2 diabetes mellitus with diabetic polyneuropathy: Secondary | ICD-10-CM

## 2021-03-16 DIAGNOSIS — M79675 Pain in left toe(s): Secondary | ICD-10-CM

## 2021-03-16 MED ORDER — CEPHALEXIN 500 MG PO CAPS: 500.0000 mg | ORAL_CAPSULE | Freq: Three times a day (TID) | ORAL | 0 refills | Status: DC

## 2021-03-16 NOTE — Patient Instructions (Signed)
Soak Instructions    Place 1/4 cup of epsom salts (or betadine, or white vinegar) in a quart of warm tap water.  Submerge your foot or feet with outer bandage intact for the initial soak; this will allow the bandage to become moist and wet for easy lift off.  Once you remove your bandage, continue to soak in the solution for 20 minutes.  This soak should be done twice a day.  Next, remove your foot or feet from solution, blot dry the affected area and cover.  Apply betadine or iodine ointment to the nail and ocver with a bandaid

## 2021-03-17 NOTE — Progress Notes (Signed)
°  Subjective:  Patient ID: [name], female    DOB: 1945/09/08,  MRN: [id number]  Chief Complaint  Patient presents with   Nail Problem    Right great toenail pain    76 y.o. female returns for follow-up with the above complaint. History confirmed with patient.  Nails are becoming quite painful againThe right great toe has been swollen red and painful they have applying a brown ointment to it.  She also has a wound on her left calf that the wound care nurse at the facility has been managing.  Says she is about to go to the wound care center  Objective:  Physical Exam: warm, good capillary refill, no trophic changes or ulcerative lesions and normal DP and PT pulses.  Loss of protective sensation noted in toes bilaterally with abnormal monofilament test.  Onychomycosis is noted with thickened elongated nail plates, yellow discoloration ingrowing incurvated borders.  Left leg is bandaged.  The right great toe has erythema and ingrowing of the paronychia on the medial border Assessment:   1. Paronychia of great toe, right   2. Pain due to onychomycosis of toenails of both feet   3. 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 Type 2 diabetes mellitus with diabetic polyneuropathy, unspecified whether long term insulin use ([place])       Plan:  Patient was evaluated and treated and all questions answered.  Patient educated on diabetes. Discussed proper diabetic foot care and discussed risks and complications of disease. Educated patient in depth on reasons to return to the office immediately should he/she discover anything concerning or new on the feet. All questions answered. Discussed proper shoes as well.    Discussed the etiology and treatment options for the condition in detail with the patient. Educated patient on the topical and oral treatment options for mycotic nails. Recommended debridement of the nails today. Sharp and mechanical debridement performed of  all painful and mycotic nails today. Nails debrided in length and thickness using a nail nipper and a mechanical burr to level of comfort. Discussed treatment options including appropriate shoe gear. Follow up as needed for painful nails.1  For the right great toe paronychia I debrided the offending border in a slant back fashion to alleviate pressure.  Applied Iodosorb ointment.  They should continue this at the facility which I indicated on her post care instructions.  I would like to see her back in 3 weeks for recheck of this I placed her on Keflex 500 mg 3 times daily.  If has not improved by next visit would likely plan for avulsion of nail plate.    Regarding her left leg wound she would like a wound care center referral and I do not see any active referral placed today put an order in for this and they will manage this further.  Continue current regimen until then  Return in about 3 weeks (around 04/06/2021) for nail re-check.

## 2021-03-19 LAB — BASIC METABOLIC PANEL

## 2021-03-20 ENCOUNTER — Institutional Professional Consult (permissible substitution): Payer: Medicare Other | Admitting: Pulmonary Disease

## 2021-03-20 NOTE — Progress Notes (Unsigned)
Synopsis: Referred in March 2023 for COPD by [name], NP  Subjective:   PATIENT ID: [name] GENDER: female DOB: [birth year], MRN: [id number]   HPI  No chief complaint on file.  [name] is a 76 year old woman, former smoker with coronary artery disease, hypertension, DMII and OSA who is referred to pulmonary clinic for COPD.     Past Medical History:  Diagnosis Date   Coronary artery disease    Diabetes mellitus without complication (HCC)    Hyperlipidemia    Hypertension    OSA (obstructive sleep apnea)      Family History  Problem Relation Age of Onset   Heart attack Mother    Stroke Mother    Heart attack Father      Social History   Socioeconomic History   Marital status: Widowed    Spouse name: Not on file   Number of children: Not on file   Years of education: Not on file   Highest education level: Not on file  Occupational History   Occupation: retired  Tobacco Use   Smoking status: Former    Packs/day: 2.00    Years: 10.00    Pack years: 20.00    Types: Cigarettes    Quit date: 08/15/1989    Years since quitting: 31.6   Smokeless tobacco: Never  Substance and Sexual Activity   Alcohol use: Not Currently   Drug use: Not Currently   Sexual activity: Not on file  Other Topics Concern   Not on file  Social History Narrative   Not on file   Social Determinants of Health   Financial Resource Strain: Not on file  Food Insecurity: Not on file  Transportation Needs: Not on file  Physical Activity: Not on file  Stress: Not on file  Social Connections: Not on file  Intimate Partner Violence: Not on file     Allergies  Allergen Reactions   Metaxalone Anaphylaxis   Nitroglycerin     BP bottoms out   Tramadol-Acetaminophen Anaphylaxis   Icosapent Ethyl Other (See Comments)    Stomach pain, gas, diarrhea, headache from Vascepa   Brompheniramine-Pseudoeph     Feels drunk   Meprobamate    Metformin Nausea Only    "flushes"    Methyldopa     Other reaction(s): Unknown   Metoprolol     Other reaction(s): Unknown   Procaine     Passes out   Pseudoephedrine Hcl     "feel drunk"   Tramadol     "Made me go wonky, got fluid in my lungs"     Outpatient Medications Prior to Visit  Medication Sig Dispense Refill   acetaminophen (TYLENOL) 500 MG tablet Take 500 mg by mouth every 8 (eight) hours as needed for moderate pain or headache.     albuterol (VENTOLIN HFA) 108 (90 Base) MCG/ACT inhaler Inhale 2 puffs into the lungs 4 (four) times daily as needed for wheezing or shortness of breath. (Patient not taking: Reported on 03/10/2021)     amLODipine (NORVASC) 5 MG tablet Take 5 mg by mouth daily.     aspirin EC 81 MG tablet Take 81 mg by mouth daily. Swallow whole.     BREO ELLIPTA 200-25 MCG/INH AEPB Inhale 1 puff into the lungs daily.     carvedilol (COREG) 25 MG tablet Take 25 mg by mouth 2 (two) times daily with a meal.     cephALEXin (KEFLEX) 500 MG capsule Take 1 capsule (500 mg  total) by mouth 3 (three) times daily. 30 capsule 0   cetirizine (ZYRTEC) 10 MG tablet Take 10 mg by mouth daily.     Cholecalciferol (VITAMIN D3) 125 MCG (5000 UT) CAPS Take 5,000 Units by mouth.     diclofenac Sodium (VOLTAREN) 1 % GEL Apply 2-4 g topically 4 (four) times daily as needed (pain).     ferrous sulfate 325 (65 FE) MG tablet Take 1 tablet (325 mg total) by mouth daily with breakfast.  3   fluticasone (FLONASE) 50 MCG/ACT nasal spray Place 1 spray into both nostrils daily as needed for allergies or rhinitis. (Patient not taking: Reported on 03/10/2021)     glucose 4 GM chewable tablet Chew 3 tablets by mouth as needed for low blood sugar.     glucose blood test strip as directed. Dispense Blood Glucose Test Strips to patient for home use. Testing 4 times daily.     HYDROcodone-acetaminophen (NORCO/VICODIN) 5-325 MG tablet Take 1 tablet by mouth every 6 (six) hours as needed for moderate pain. 20 tablet 0   insulin NPH-regular  Human (70-30) 100 UNIT/ML injection Inject 73 Units into the skin with breakfast, with lunch, and with evening meal.     Insulin Syringe-Needle U-100 (INSULIN SYRINGE 1CC/31GX5/16") 31G X 5/16" 1 ML MISC USE TO INJECT INSULIN TWO TIMES A DAY     Ixekizumab 80 MG/ML SOAJ Inject into the skin. Give subcutaneous every 4 weeks     Lancets 33G MISC as directed. Testing 4 times daily.  Dispense Lancets to patient for home use.     lisinopril (ZESTRIL) 10 MG tablet Take 10 mg by mouth daily.     melatonin 5 MG TABS Take 1 tablet (5 mg total) by mouth at bedtime. 30 tablet 0   methocarbamol (ROBAXIN) 500 MG tablet Take 1 tablet (500 mg total) by mouth every 6 (six) hours as needed for muscle spasms. (Patient not taking: Reported on 03/10/2021)     Multiple Vitamin (MULTIVITAMIN ADULT PO) Take 1 tablet by mouth daily.     ondansetron (ZOFRAN) 4 MG tablet Take 1 tablet (4 mg total) by mouth every 8 (eight) hours as needed for nausea or vomiting. (Patient not taking: Reported on 03/10/2021) 20 tablet 0   polyethylene glycol (MIRALAX / GLYCOLAX) 17 g packet Take 17 g by mouth daily as needed for mild constipation.     ranolazine (RANEXA) 1000 MG SR tablet Take 500 mg by mouth 2 (two) times daily.     Respiratory Therapy Supplies [place] as directed. BIPAP 18/14 cm H2O with humidifier, mask, tubing, and headgear.     rosuvastatin (CRESTOR) 20 MG tablet Take 20 mg by mouth at bedtime.     senna-docusate (SENOKOT-S) 8.6-50 MG tablet Take 1 tablet by mouth 2 (two) times daily.     tiotropium (SPIRIVA HANDIHALER) 18 MCG inhalation capsule Place 18 mcg into inhaler and inhale daily.     torsemide (DEMADEX) 100 MG tablet Take 0.5 tablets (50 mg total) by mouth daily. 30 tablet 0   vitamin B-12 (CYANOCOBALAMIN) 1000 MCG tablet Take 1,000 mcg by mouth daily.     No facility-administered medications prior to visit.    Review of Systems  Constitutional:  Negative for chills, fever, malaise/fatigue and weight loss.  HENT:   Negative for congestion, sinus pain and sore throat.   Eyes: Negative.   Respiratory:  Negative for cough, hemoptysis, sputum production, shortness of breath and wheezing.   Cardiovascular:  Negative for chest pain, palpitations,  orthopnea, claudication and leg swelling.  Gastrointestinal:  Negative for abdominal pain, heartburn, nausea and vomiting.  Genitourinary: Negative.   Musculoskeletal:  Negative for joint pain and myalgias.  Skin:  Negative for rash.  Neurological:  Negative for weakness.  Endo/Heme/Allergies: Negative.   Psychiatric/Behavioral: Negative.       Objective:  There were no vitals filed for this visit.   Physical Exam Constitutional:      General: She is not in acute distress.    Appearance: She is not ill-appearing.  HENT:     Head: Normocephalic and atraumatic.  Eyes:     General: No scleral icterus.    Conjunctiva/sclera: Conjunctivae normal.     Pupils: Pupils are equal, round, and reactive to light.  Cardiovascular:     Rate and Rhythm: Normal rate and regular rhythm.     Pulses: Normal pulses.     Heart sounds: Normal heart sounds. No murmur heard. Pulmonary:     Effort: Pulmonary effort is normal.     Breath sounds: Normal breath sounds. No wheezing, rhonchi or rales.  Abdominal:     General: Bowel sounds are normal.     Palpations: Abdomen is soft.  Musculoskeletal:     Right lower leg: No edema.     Left lower leg: No edema.  Lymphadenopathy:     Cervical: No cervical adenopathy.  Skin:    General: Skin is warm and dry.  Neurological:     General: No focal deficit present.     Mental Status: She is alert.  Psychiatric:        Mood and Affect: Mood normal.        Behavior: Behavior normal.        Thought Content: Thought content normal.        Judgment: Judgment normal.      CBC    Component Value Date/Time   WBC 6.6 02/04/2021 0410   RBC 3.14 (L) 02/04/2021 0410   HGB 9.4 (L) 02/04/2021 0410   HGB 11.1 08/16/2019 1424    HCT 29.0 (L) 02/04/2021 0410   HCT 33.6 (L) 08/16/2019 1424   PLT 173 02/04/2021 0410   PLT 180 08/16/2019 1424   MCV 92.4 02/04/2021 0410   MCV 94 08/16/2019 1424   MCH 29.9 02/04/2021 0410   MCHC 32.4 02/04/2021 0410   RDW 16.4 (H) 02/04/2021 0410   RDW 16.6 (H) 08/16/2019 1424   LYMPHSABS 0.8 02/04/2021 0410   MONOABS 0.6 02/04/2021 0410   EOSABS 0.2 02/04/2021 0410   BASOSABS 0.0 02/04/2021 0410   Chest imaging: CXR 01/31/21 Stable cardiomegaly. Aortic atherosclerosis. Streaky bibasilar opacities. No overt edema. No pleural effusion or pneumothorax.   PFT: No flowsheet data found.  Labs:  Path:  Echo 02/02/21: LV EF >75%. LV has hyperdynamic function. RV not well visualized.   Heart Catheterization:  Assessment & Plan:   No diagnosis found.  Discussion: ***    Current Outpatient Medications:    acetaminophen (TYLENOL) 500 MG tablet, Take 500 mg by mouth every 8 (eight) hours as needed for moderate pain or headache., Disp: , Rfl:    albuterol (VENTOLIN HFA) 108 (90 Base) MCG/ACT inhaler, Inhale 2 puffs into the lungs 4 (four) times daily as needed for wheezing or shortness of breath. (Patient not taking: Reported on 03/10/2021), Disp: , Rfl:    amLODipine (NORVASC) 5 MG tablet, Take 5 mg by mouth daily., Disp: , Rfl:    aspirin EC 81 MG tablet, Take 81 mg by  mouth daily. Swallow whole., Disp: , Rfl:    BREO ELLIPTA 200-25 MCG/INH AEPB, Inhale 1 puff into the lungs daily., Disp: , Rfl:    carvedilol (COREG) 25 MG tablet, Take 25 mg by mouth 2 (two) times daily with a meal., Disp: , Rfl:    cephALEXin (KEFLEX) 500 MG capsule, Take 1 capsule (500 mg total) by mouth 3 (three) times daily., Disp: 30 capsule, Rfl: 0   cetirizine (ZYRTEC) 10 MG tablet, Take 10 mg by mouth daily., Disp: , Rfl:    Cholecalciferol (VITAMIN D3) 125 MCG (5000 UT) CAPS, Take 5,000 Units by mouth., Disp: , Rfl:    diclofenac Sodium (VOLTAREN) 1 % GEL, Apply 2-4 g topically 4 (four) times daily as  needed (pain)., Disp: , Rfl:    ferrous sulfate 325 (65 FE) MG tablet, Take 1 tablet (325 mg total) by mouth daily with breakfast., Disp: , Rfl: 3   fluticasone (FLONASE) 50 MCG/ACT nasal spray, Place 1 spray into both nostrils daily as needed for allergies or rhinitis. (Patient not taking: Reported on 03/10/2021), Disp: , Rfl:    glucose 4 GM chewable tablet, Chew 3 tablets by mouth as needed for low blood sugar., Disp: , Rfl:    glucose blood test strip, as directed. Dispense Blood Glucose Test Strips to patient for home use. Testing 4 times daily., Disp: , Rfl:    HYDROcodone-acetaminophen (NORCO/VICODIN) 5-325 MG tablet, Take 1 tablet by mouth every 6 (six) hours as needed for moderate pain., Disp: 20 tablet, Rfl: 0   insulin NPH-regular Human (70-30) 100 UNIT/ML injection, Inject 73 Units into the skin with breakfast, with lunch, and with evening meal., Disp: , Rfl:    Insulin Syringe-Needle U-100 (INSULIN SYRINGE 1CC/31GX5/16") 31G X 5/16" 1 ML MISC, USE TO INJECT INSULIN TWO TIMES A DAY, Disp: , Rfl:    Ixekizumab 80 MG/ML SOAJ, Inject into the skin. Give subcutaneous every 4 weeks, Disp: , Rfl:    Lancets 33G MISC, as directed. Testing 4 times daily.  Dispense Lancets to patient for home use., Disp: , Rfl:    lisinopril (ZESTRIL) 10 MG tablet, Take 10 mg by mouth daily., Disp: , Rfl:    melatonin 5 MG TABS, Take 1 tablet (5 mg total) by mouth at bedtime., Disp: 30 tablet, Rfl: 0   methocarbamol (ROBAXIN) 500 MG tablet, Take 1 tablet (500 mg total) by mouth every 6 (six) hours as needed for muscle spasms. (Patient not taking: Reported on 03/10/2021), Disp: , Rfl:    Multiple Vitamin (MULTIVITAMIN ADULT PO), Take 1 tablet by mouth daily., Disp: , Rfl:    ondansetron (ZOFRAN) 4 MG tablet, Take 1 tablet (4 mg total) by mouth every 8 (eight) hours as needed for nausea or vomiting. (Patient not taking: Reported on 03/10/2021), Disp: 20 tablet, Rfl: 0   polyethylene glycol (MIRALAX / GLYCOLAX) 17 g  packet, Take 17 g by mouth daily as needed for mild constipation., Disp: , Rfl:    ranolazine (RANEXA) 1000 MG SR tablet, Take 500 mg by mouth 2 (two) times daily., Disp: , Rfl:    Respiratory Therapy Supplies [place], as directed. BIPAP 18/14 cm H2O with humidifier, mask, tubing, and headgear., Disp: , Rfl:    rosuvastatin (CRESTOR) 20 MG tablet, Take 20 mg by mouth at bedtime., Disp: , Rfl:    senna-docusate (SENOKOT-S) 8.6-50 MG tablet, Take 1 tablet by mouth 2 (two) times daily., Disp: , Rfl:    tiotropium (SPIRIVA HANDIHALER) 18 MCG inhalation capsule, Place 18  mcg into inhaler and inhale daily., Disp: , Rfl:    torsemide (DEMADEX) 100 MG tablet, Take 0.5 tablets (50 mg total) by mouth daily., Disp: 30 tablet, Rfl: 0   vitamin B-12 (CYANOCOBALAMIN) 1000 MCG tablet, Take 1,000 mcg by mouth daily., Disp: , Rfl:

## 2021-03-23 LAB — BRAIN NATRIURETIC PEPTIDE: BNP: 68.4 pg/mL (ref 0.0–100.0)

## 2021-03-23 LAB — BASIC METABOLIC PANEL

## 2021-03-30 ENCOUNTER — Emergency Department (HOSPITAL_COMMUNITY): Payer: Medicare Other

## 2021-03-30 ENCOUNTER — Encounter (HOSPITAL_COMMUNITY): Payer: Self-pay | Admitting: Internal Medicine

## 2021-03-30 ENCOUNTER — Inpatient Hospital Stay (HOSPITAL_COMMUNITY)
Admission: EM | Admit: 2021-03-30 | Discharge: 2021-04-02 | DRG: 682 | Disposition: A | Discharge status: Skilled Nursing Facility | Payer: Medicare Other | Source: Skilled Nursing Facility | Attending: Internal Medicine | Admitting: Internal Medicine

## 2021-03-30 DIAGNOSIS — T50915A Adverse effect of multiple unspecified drugs, medicaments and biological substances, initial encounter: Secondary | ICD-10-CM | POA: Diagnosis present

## 2021-03-30 DIAGNOSIS — Z9981 Dependence on supplemental oxygen: Secondary | ICD-10-CM

## 2021-03-30 DIAGNOSIS — D649 Anemia, unspecified: Secondary | ICD-10-CM | POA: Diagnosis present

## 2021-03-30 DIAGNOSIS — I83029 Varicose veins of left lower extremity with ulcer of unspecified site: Secondary | ICD-10-CM | POA: Diagnosis present

## 2021-03-30 DIAGNOSIS — Z7982 Long term (current) use of aspirin: Secondary | ICD-10-CM

## 2021-03-30 DIAGNOSIS — R778 Other specified abnormalities of plasma proteins: Secondary | ICD-10-CM | POA: Diagnosis not present

## 2021-03-30 DIAGNOSIS — D509 Iron deficiency anemia, unspecified: Secondary | ICD-10-CM | POA: Diagnosis present

## 2021-03-30 DIAGNOSIS — E162 Hypoglycemia, unspecified: Secondary | ICD-10-CM | POA: Diagnosis not present

## 2021-03-30 DIAGNOSIS — Z7951 Long term (current) use of inhaled steroids: Secondary | ICD-10-CM

## 2021-03-30 DIAGNOSIS — K5900 Constipation, unspecified: Secondary | ICD-10-CM | POA: Diagnosis present

## 2021-03-30 DIAGNOSIS — I959 Hypotension, unspecified: Secondary | ICD-10-CM | POA: Diagnosis present

## 2021-03-30 DIAGNOSIS — I2510 Atherosclerotic heart disease of native coronary artery without angina pectoris: Secondary | ICD-10-CM | POA: Diagnosis present

## 2021-03-30 DIAGNOSIS — R202 Paresthesia of skin: Secondary | ICD-10-CM | POA: Diagnosis present

## 2021-03-30 DIAGNOSIS — Z20822 Contact with and (suspected) exposure to covid-19: Secondary | ICD-10-CM | POA: Diagnosis present

## 2021-03-30 DIAGNOSIS — L304 Erythema intertrigo: Secondary | ICD-10-CM | POA: Diagnosis present

## 2021-03-30 DIAGNOSIS — G9340 Encephalopathy, unspecified: Secondary | ICD-10-CM | POA: Diagnosis present

## 2021-03-30 DIAGNOSIS — Z66 Do not resuscitate: Secondary | ICD-10-CM | POA: Diagnosis present

## 2021-03-30 DIAGNOSIS — T426X5A Adverse effect of other antiepileptic and sedative-hypnotic drugs, initial encounter: Secondary | ICD-10-CM | POA: Diagnosis present

## 2021-03-30 DIAGNOSIS — I110 Hypertensive heart disease with heart failure: Secondary | ICD-10-CM | POA: Diagnosis present

## 2021-03-30 DIAGNOSIS — Z87891 Personal history of nicotine dependence: Secondary | ICD-10-CM | POA: Diagnosis not present

## 2021-03-30 DIAGNOSIS — Z6841 Body Mass Index (BMI) 40.0 and over, adult: Secondary | ICD-10-CM | POA: Diagnosis not present

## 2021-03-30 DIAGNOSIS — E1151 Type 2 diabetes mellitus with diabetic peripheral angiopathy without gangrene: Secondary | ICD-10-CM | POA: Diagnosis present

## 2021-03-30 DIAGNOSIS — D638 Anemia in other chronic diseases classified elsewhere: Secondary | ICD-10-CM | POA: Diagnosis present

## 2021-03-30 DIAGNOSIS — E11649 Type 2 diabetes mellitus with hypoglycemia without coma: Secondary | ICD-10-CM | POA: Diagnosis present

## 2021-03-30 DIAGNOSIS — E1140 Type 2 diabetes mellitus with diabetic neuropathy, unspecified: Secondary | ICD-10-CM | POA: Diagnosis present

## 2021-03-30 DIAGNOSIS — I5032 Chronic diastolic (congestive) heart failure: Secondary | ICD-10-CM | POA: Diagnosis present

## 2021-03-30 DIAGNOSIS — Z8249 Family history of ischemic heart disease and other diseases of the circulatory system: Secondary | ICD-10-CM | POA: Diagnosis not present

## 2021-03-30 DIAGNOSIS — Z79899 Other long term (current) drug therapy: Secondary | ICD-10-CM

## 2021-03-30 DIAGNOSIS — Z888 Allergy status to other drugs, medicaments and biological substances status: Secondary | ICD-10-CM

## 2021-03-30 DIAGNOSIS — J9611 Chronic respiratory failure with hypoxia: Secondary | ICD-10-CM | POA: Diagnosis present

## 2021-03-30 DIAGNOSIS — G9341 Metabolic encephalopathy: Secondary | ICD-10-CM | POA: Diagnosis present

## 2021-03-30 DIAGNOSIS — I878 Other specified disorders of veins: Secondary | ICD-10-CM | POA: Diagnosis present

## 2021-03-30 DIAGNOSIS — Z794 Long term (current) use of insulin: Secondary | ICD-10-CM

## 2021-03-30 DIAGNOSIS — E785 Hyperlipidemia, unspecified: Secondary | ICD-10-CM | POA: Diagnosis present

## 2021-03-30 DIAGNOSIS — R0602 Shortness of breath: Principal | ICD-10-CM

## 2021-03-30 DIAGNOSIS — G4733 Obstructive sleep apnea (adult) (pediatric): Secondary | ICD-10-CM | POA: Diagnosis present

## 2021-03-30 DIAGNOSIS — N179 Acute kidney failure, unspecified: Secondary | ICD-10-CM | POA: Diagnosis present

## 2021-03-30 DIAGNOSIS — T40605A Adverse effect of unspecified narcotics, initial encounter: Secondary | ICD-10-CM | POA: Diagnosis present

## 2021-03-30 DIAGNOSIS — R000 Tachycardia, unspecified: Secondary | ICD-10-CM | POA: Diagnosis not present

## 2021-03-30 DIAGNOSIS — Z823 Family history of stroke: Secondary | ICD-10-CM

## 2021-03-30 LAB — COMPREHENSIVE METABOLIC PANEL
ALT: 16 U/L (ref 0–44)
AST: 16 U/L (ref 15–41)
Albumin: 3.2 g/dL — ABNORMAL LOW (ref 3.5–5.0)
Alkaline Phosphatase: 44 U/L (ref 38–126)
Anion gap: 13 (ref 5–15)
BUN: 95 mg/dL — ABNORMAL HIGH (ref 8–23)
CO2: 26 mmol/L (ref 22–32)
Calcium: 8.7 mg/dL — ABNORMAL LOW (ref 8.9–10.3)
Chloride: 97 mmol/L — ABNORMAL LOW (ref 98–111)
Creatinine, Ser: 3.53 mg/dL — ABNORMAL HIGH (ref 0.44–1.00)
GFR, Estimated: 13 mL/min — ABNORMAL LOW (ref >60)
Glucose, Bld: 99 mg/dL (ref 70–99)
Potassium: 4.8 mmol/L (ref 3.5–5.1)
Sodium: 136 mmol/L (ref 135–145)
Total Bilirubin: 0.5 mg/dL (ref 0.3–1.2)
Total Protein: 6.8 g/dL (ref 6.5–8.1)

## 2021-03-30 LAB — I-STAT VENOUS BLOOD GAS, ED
Acid-Base Excess: 3 mmol/L — ABNORMAL HIGH (ref 0.0–2.0)
Bicarbonate: 28.5 mmol/L — ABNORMAL HIGH (ref 20.0–28.0)
Calcium, Ion: 1.09 mmol/L — ABNORMAL LOW (ref 1.15–1.40)
HCT: 25 % — ABNORMAL LOW (ref 36.0–46.0)
Hemoglobin: 8.5 g/dL — ABNORMAL LOW (ref 12.0–15.0)
O2 Saturation: 98 %
Potassium: 4.7 mmol/L (ref 3.5–5.1)
Sodium: 134 mmol/L — ABNORMAL LOW (ref 135–145)
TCO2: 30 mmol/L (ref 22–32)
pCO2, Ven: 50.5 mmHg (ref 44–60)
pH, Ven: 7.361 (ref 7.25–7.43)
pO2, Ven: 118 mmHg — ABNORMAL HIGH (ref 32–45)

## 2021-03-30 LAB — CBC WITH DIFFERENTIAL/PLATELET
Abs Immature Granulocytes: 0.04 10*3/uL (ref 0.00–0.07)
Basophils Absolute: 0 10*3/uL (ref 0.0–0.1)
Basophils Relative: 0 %
Eosinophils Absolute: 0.1 10*3/uL (ref 0.0–0.5)
Eosinophils Relative: 1 %
HCT: 26.2 % — ABNORMAL LOW (ref 36.0–46.0)
Hemoglobin: 8 g/dL — ABNORMAL LOW (ref 12.0–15.0)
Immature Granulocytes: 1 %
Lymphocytes Relative: 6 %
Lymphs Abs: 0.5 10*3/uL — ABNORMAL LOW (ref 0.7–4.0)
MCH: 27.8 pg (ref 26.0–34.0)
MCHC: 30.5 g/dL (ref 30.0–36.0)
MCV: 91 fL (ref 80.0–100.0)
Monocytes Absolute: 0.5 10*3/uL (ref 0.1–1.0)
Monocytes Relative: 6 %
Neutro Abs: 7.3 10*3/uL (ref 1.7–7.7)
Neutrophils Relative %: 86 %
Platelets: 200 10*3/uL (ref 150–400)
RBC: 2.88 MIL/uL — ABNORMAL LOW (ref 3.87–5.11)
RDW: 17.7 % — ABNORMAL HIGH (ref 11.5–15.5)
WBC: 8.5 10*3/uL (ref 4.0–10.5)
nRBC: 0 % (ref 0.0–0.2)

## 2021-03-30 LAB — TROPONIN I (HIGH SENSITIVITY): Troponin I (High Sensitivity): 21 ng/L — ABNORMAL HIGH (ref <18)

## 2021-03-30 LAB — CBG MONITORING, ED: Glucose-Capillary: 46 mg/dL — ABNORMAL LOW (ref 70–99)

## 2021-03-30 LAB — BRAIN NATRIURETIC PEPTIDE: B Natriuretic Peptide: 116.3 pg/mL — ABNORMAL HIGH (ref 0.0–100.0)

## 2021-03-30 MED ORDER — ACETAMINOPHEN 650 MG RE SUPP: 650.0000 mg | Freq: Four times a day (QID) | RECTAL | Status: DC | PRN

## 2021-03-30 MED ORDER — HYDROCODONE-ACETAMINOPHEN 5-325 MG PO TABS
2.0000 | ORAL_TABLET | Freq: Once | ORAL | Status: AC
  Administered 2021-03-30: 2 via ORAL
  Filled 2021-03-30: qty 2

## 2021-03-30 MED ORDER — ACETAMINOPHEN 325 MG PO TABS
650.0000 mg | ORAL_TABLET | Freq: Four times a day (QID) | ORAL | Status: DC | PRN
  Administered 2021-04-01: 650 mg via ORAL
  Filled 2021-03-30: qty 2

## 2021-03-30 MED ORDER — LACTATED RINGERS IV BOLUS
1000.0000 mL | Freq: Once | INTRAVENOUS | Status: AC
  Administered 2021-03-30: 1000 mL via INTRAVENOUS

## 2021-03-30 MED ORDER — INSULIN ASPART 100 UNIT/ML IJ SOLN
0.0000 [IU] | Freq: Three times a day (TID) | INTRAMUSCULAR | Status: DC
  Administered 2021-04-01: 3 [IU] via SUBCUTANEOUS
  Administered 2021-04-01: 1 [IU] via SUBCUTANEOUS
  Administered 2021-04-02: 2 [IU] via SUBCUTANEOUS
  Administered 2021-04-02: 1 [IU] via SUBCUTANEOUS

## 2021-03-30 MED ORDER — DEXTROSE 50 % IV SOLN
1.0000 | INTRAVENOUS | Status: DC | PRN
  Administered 2021-03-31 (×2): 50 mL via INTRAVENOUS
  Filled 2021-03-30 (×2): qty 50

## 2021-03-30 NOTE — ED Triage Notes (Signed)
Pt arrived via [facility] for cc of abnormal labs and shortness of breath from [facility]. Staff reported to EMS that pt had elevated BUN and needed transport to ED for evaluation. Pt on 6L [place], but is reporting increased shortness of breath as well as severe pain on bilateral lower legs with weeping wounds.  ? ?EMS Vitals  ?BP 116/72 ?HR 80 ?RR 22 ?SPO2 97%  ? ?

## 2021-03-30 NOTE — ED Notes (Signed)
Pt placed back in bed at this time. Reporting significant pain in legs and back. PA Kiehl notified.  ?

## 2021-03-30 NOTE — ED Notes (Addendum)
Pt refuses to lay in bed, currently seating on the edge of the bed. Pt notified that she is labeled as a "high fall risk" and that she needs to get back in bed. Pt states that she would like pain meds as this would help lay fully in bed due to her back pain. RN notified and per RN no pain meds will be given to pt. Advised pt once more after RNs decision and pt continues to be non-compliant with the directions given.  ?  ?

## 2021-03-30 NOTE — ED Notes (Signed)
Admitting MD paged regarding hypotension ? ?

## 2021-03-30 NOTE — ED Notes (Addendum)
RN notified by registration to be be sitting on edge of bed with feet hanging, pt told by RN that it is not a safe position to be and needed to remain fully in bed. Pt refusing due to pain in her leg saying "Yall stirred up everything that hurts."  ?

## 2021-03-30 NOTE — ED Provider Notes (Incomplete)
[facility]PART[facility] Provider Note   CSN: [id number] Arrival date & time: 03/30/21  2019     History {Add pertinent medical, surgical, social history, OB history to HPI:1} No chief complaint on file.   [name] is a 76 y.o. female.  HPI 37 yoF w/ PMHx of chronic diastolic CHF, CAD, chronic hypoxic respiratory failure on 3 L of supplemental oxygen via nasal cannula, OSA on CPAP, hypertension, insulin-dependent diabetes mellitus type 2, history of pheochromocytoma status post right adrenalectomy and psoriasis presenting for ***    Home Medications Prior to Admission medications   Medication Sig Start Date End Date Taking? Authorizing Provider  acetaminophen (TYLENOL) 500 MG tablet Take 500 mg by mouth every 8 (eight) hours as needed for moderate pain or headache.    [provider]  albuterol (VENTOLIN HFA) 108 (90 Base) MCG/ACT inhaler Inhale 2 puffs into the lungs 4 (four) times daily as needed for wheezing or shortness of breath. Patient not taking: Reported on 03/10/2021    [provider]  amLODipine (NORVASC) 5 MG tablet Take 5 mg by mouth daily.    [provider]  aspirin EC 81 MG tablet Take 81 mg by mouth daily. Swallow whole.    [provider]  BREO ELLIPTA 200-25 MCG/INH AEPB Inhale 1 puff into the lungs daily. 04/22/20   [provider]  carvedilol (COREG) 25 MG tablet Take 25 mg by mouth 2 (two) times daily with a meal.    [provider]  cephALEXin (KEFLEX) 500 MG capsule Take 1 capsule (500 mg total) by mouth 3 (three) times daily. 03/16/21   McDonald, [name], DPM  cetirizine (ZYRTEC) 10 MG tablet Take 10 mg by mouth daily.    [provider]  Cholecalciferol (VITAMIN D3) 125 MCG (5000 UT) CAPS Take 5,000 Units by mouth.    [provider]  diclofenac Sodium (VOLTAREN) 1 % GEL Apply 2-4 g topically 4 (four) times daily as needed (pain).    [provider]   ferrous sulfate 325 (65 FE) MG tablet Take 1 tablet (325 mg total) by mouth daily with breakfast. 02/06/21   [name], MD  fluticasone [facility]) 50 MCG/ACT nasal spray Place 1 spray into both nostrils daily as needed for allergies or rhinitis. Patient not taking: Reported on 03/10/2021    [provider]  glucose 4 GM chewable tablet Chew 3 tablets by mouth as needed for low blood sugar.    [provider]  glucose blood test strip as directed. Dispense Blood Glucose Test Strips to patient for home use. Testing 4 times daily. 01/16/19   [provider]  HYDROcodone-acetaminophen (NORCO/VICODIN) 5-325 MG tablet Take 1 tablet by mouth every 6 (six) hours as needed for moderate pain. 02/05/21   [name], MD  insulin NPH-regular Human (70-30) 100 UNIT/ML injection Inject 73 Units into the skin with breakfast, with lunch, and with evening meal.    [provider]  Insulin Syringe-Needle U-100 (INSULIN SYRINGE 1CC/31GX5/16") 31G X 5/16" 1 ML MISC USE TO INJECT INSULIN TWO TIMES A DAY 03/28/19   [provider]  Ixekizumab 80 MG/ML SOAJ Inject into the skin. Give subcutaneous every 4 weeks    [provider]  Lancets 33G MISC as directed. Testing 4 times daily.  Dispense Lancets to patient for home use. 01/16/19   [provider]  lisinopril (ZESTRIL) 10 MG tablet Take 10 mg by mouth daily.    [provider]  melatonin 5 MG TABS Take 1 tablet (5 mg total) by mouth at bedtime. 02/05/21   [name], MD  methocarbamol (ROBAXIN) 500 MG tablet Take 1 tablet (500 mg total) by mouth every 6 (six) hours as needed for muscle spasms. Patient not taking: Reported on 03/10/2021 02/05/21   [name], MD  Multiple Vitamin (MULTIVITAMIN ADULT PO) Take 1 tablet by mouth daily.    [provider]  ondansetron (ZOFRAN) 4 MG tablet Take 1 tablet (4 mg total) by mouth every 8 (eight) hours as needed for nausea or  vomiting. Patient not taking: Reported on 03/10/2021 02/05/21   [name], MD  polyethylene glycol (MIRALAX / GLYCOLAX) 17 g packet Take 17 g by mouth daily as needed for mild constipation.    [provider]  ranolazine (RANEXA) 1000 MG SR tablet Take 500 mg by mouth 2 (two) times daily.    [provider]  Respiratory Therapy Supplies [place] as directed. BIPAP 18/14 cm H2O with humidifier, mask, tubing, and headgear. 03/14/19   [provider]  rosuvastatin (CRESTOR) 20 MG tablet Take 20 mg by mouth at bedtime.    [provider]  senna-docusate (SENOKOT-S) 8.6-50 MG tablet Take 1 tablet by mouth 2 (two) times daily. 02/05/21   [name], MD  tiotropium (SPIRIVA HANDIHALER) 18 MCG inhalation capsule Place 18 mcg into inhaler and inhale daily.    [provider]  torsemide (DEMADEX) 100 MG tablet Take 0.5 tablets (50 mg total) by mouth daily. 02/06/21   [name], MD  vitamin B-12 (CYANOCOBALAMIN) 1000 MCG tablet Take 1,000 mcg by mouth daily.    [provider]      Allergies    Metaxalone, Nitroglycerin, Tramadol-acetaminophen, Icosapent ethyl, Brompheniramine-pseudoeph, Meprobamate, Metformin, Methyldopa, Metoprolol, Procaine, Pseudoephedrine hcl, and Tramadol    Review of Systems   Review of Systems  Physical Exam Updated Vital Signs There were no vitals taken for this visit. Physical Exam  ED Results / Procedures / Treatments   Labs (all labs ordered are listed, but only abnormal results are displayed) Labs Reviewed - No data to display  EKG None  Radiology No results found.  Procedures Procedures  {Document cardiac monitor, telemetry assessment procedure when appropriate:1}  Medications Ordered in ED Medications - No data to display  ED Course/ Medical Decision Making/ A&P                           Medical Decision Making  ***   Recent admission for CHF exacerbation and AKI from 1/14-  1-19   {Document critical care time when appropriate:1} {Document review of labs and clinical decision tools ie heart score, Chads2Vasc2 etc:1}  {Document your independent review of radiology images, and any outside records:1} {Document your discussion with family members, caretakers, and with consultants:1} {Document social determinants of health affecting pt's care:1} {Document your decision making why or why not admission, treatments were needed:1} Final Clinical Impression(s) / ED Diagnoses Final diagnoses:  None    Rx / DC Orders ED Discharge Orders     None

## 2021-03-30 NOTE — ED Triage Notes (Incomplete)
[facility]    ?

## 2021-03-30 NOTE — ED Notes (Signed)
RN spoke with Howerter MD regarding CBG and BP. Fluid bolus order and PRN D50 to be placed. RN to recheck CBG at 0000. ?

## 2021-03-30 NOTE — ED Notes (Signed)
Pt provided orange juice and encouraged to drink.  ?

## 2021-03-31 ENCOUNTER — Other Ambulatory Visit: Payer: Self-pay

## 2021-03-31 DIAGNOSIS — D649 Anemia, unspecified: Secondary | ICD-10-CM | POA: Diagnosis present

## 2021-03-31 DIAGNOSIS — I5032 Chronic diastolic (congestive) heart failure: Secondary | ICD-10-CM | POA: Diagnosis present

## 2021-03-31 DIAGNOSIS — I959 Hypotension, unspecified: Secondary | ICD-10-CM | POA: Diagnosis present

## 2021-03-31 DIAGNOSIS — R7989 Other specified abnormal findings of blood chemistry: Secondary | ICD-10-CM | POA: Diagnosis present

## 2021-03-31 DIAGNOSIS — R778 Other specified abnormalities of plasma proteins: Secondary | ICD-10-CM | POA: Diagnosis present

## 2021-03-31 DIAGNOSIS — G9340 Encephalopathy, unspecified: Secondary | ICD-10-CM | POA: Diagnosis present

## 2021-03-31 DIAGNOSIS — E162 Hypoglycemia, unspecified: Secondary | ICD-10-CM | POA: Diagnosis present

## 2021-03-31 LAB — COMPREHENSIVE METABOLIC PANEL
ALT: 17 U/L (ref 0–44)
AST: 17 U/L (ref 15–41)
Albumin: 3.2 g/dL — ABNORMAL LOW (ref 3.5–5.0)
Alkaline Phosphatase: 38 U/L (ref 38–126)
Anion gap: 13 (ref 5–15)
BUN: 94 mg/dL — ABNORMAL HIGH (ref 8–23)
CO2: 25 mmol/L (ref 22–32)
Calcium: 8.7 mg/dL — ABNORMAL LOW (ref 8.9–10.3)
Chloride: 98 mmol/L (ref 98–111)
Creatinine, Ser: 3.25 mg/dL — ABNORMAL HIGH (ref 0.44–1.00)
GFR, Estimated: 14 mL/min — ABNORMAL LOW (ref >60)
Glucose, Bld: 56 mg/dL — ABNORMAL LOW (ref 70–99)
Potassium: 4.7 mmol/L (ref 3.5–5.1)
Sodium: 136 mmol/L (ref 135–145)
Total Bilirubin: 0.3 mg/dL (ref 0.3–1.2)
Total Protein: 6.8 g/dL (ref 6.5–8.1)

## 2021-03-31 LAB — RETICULOCYTES
Immature Retic Fract: 34.9 % — ABNORMAL HIGH (ref 2.3–15.9)
RBC.: 2.86 MIL/uL — ABNORMAL LOW (ref 3.87–5.11)
Retic Count, Absolute: 84.4 10*3/uL (ref 19.0–186.0)
Retic Ct Pct: 3 % (ref 0.4–3.1)

## 2021-03-31 LAB — RAPID URINE DRUG SCREEN, HOSP PERFORMED
Amphetamines: NOT DETECTED
Barbiturates: NOT DETECTED
Benzodiazepines: NOT DETECTED
Cocaine: NOT DETECTED
Opiates: POSITIVE — AB
Tetrahydrocannabinol: NOT DETECTED

## 2021-03-31 LAB — GLUCOSE, CAPILLARY
Glucose-Capillary: 103 mg/dL — ABNORMAL HIGH (ref 70–99)
Glucose-Capillary: 132 mg/dL — ABNORMAL HIGH (ref 70–99)
Glucose-Capillary: 135 mg/dL — ABNORMAL HIGH (ref 70–99)
Glucose-Capillary: 196 mg/dL — ABNORMAL HIGH (ref 70–99)
Glucose-Capillary: 221 mg/dL — ABNORMAL HIGH (ref 70–99)
Glucose-Capillary: 52 mg/dL — ABNORMAL LOW (ref 70–99)

## 2021-03-31 LAB — PROTIME-INR
INR: 1.2 (ref 0.8–1.2)
Prothrombin Time: 15.6 seconds — ABNORMAL HIGH (ref 11.4–15.2)

## 2021-03-31 LAB — FOLATE: Folate: 38.2 ng/mL (ref >5.9)

## 2021-03-31 LAB — CBC WITH DIFFERENTIAL/PLATELET
Abs Immature Granulocytes: 0.08 10*3/uL — ABNORMAL HIGH (ref 0.00–0.07)
Basophils Absolute: 0 10*3/uL (ref 0.0–0.1)
Basophils Relative: 0 %
Eosinophils Absolute: 0.2 10*3/uL (ref 0.0–0.5)
Eosinophils Relative: 2 %
HCT: 26.4 % — ABNORMAL LOW (ref 36.0–46.0)
Hemoglobin: 8 g/dL — ABNORMAL LOW (ref 12.0–15.0)
Immature Granulocytes: 1 %
Lymphocytes Relative: 7 %
Lymphs Abs: 0.7 10*3/uL (ref 0.7–4.0)
MCH: 27.7 pg (ref 26.0–34.0)
MCHC: 30.3 g/dL (ref 30.0–36.0)
MCV: 91.3 fL (ref 80.0–100.0)
Monocytes Absolute: 0.8 10*3/uL (ref 0.1–1.0)
Monocytes Relative: 7 %
Neutro Abs: 8.8 10*3/uL — ABNORMAL HIGH (ref 1.7–7.7)
Neutrophils Relative %: 83 %
Platelets: 216 10*3/uL (ref 150–400)
RBC: 2.89 MIL/uL — ABNORMAL LOW (ref 3.87–5.11)
RDW: 17.7 % — ABNORMAL HIGH (ref 11.5–15.5)
WBC: 10.6 10*3/uL — ABNORMAL HIGH (ref 4.0–10.5)
nRBC: 0 % (ref 0.0–0.2)

## 2021-03-31 LAB — IRON AND TIBC
Iron: 22 ug/dL — ABNORMAL LOW (ref 28–170)
Saturation Ratios: 5 % — ABNORMAL LOW (ref 10.4–31.8)
TIBC: 406 ug/dL (ref 250–450)
UIBC: 384 ug/dL

## 2021-03-31 LAB — CK: Total CK: 217 U/L (ref 38–234)

## 2021-03-31 LAB — CBG MONITORING, ED
Glucose-Capillary: 61 mg/dL — ABNORMAL LOW (ref 70–99)
Glucose-Capillary: 75 mg/dL (ref 70–99)

## 2021-03-31 LAB — URINALYSIS, COMPLETE (UACMP) WITH MICROSCOPIC
Bacteria, UA: NONE SEEN
Bilirubin Urine: NEGATIVE
Glucose, UA: NEGATIVE mg/dL
Hgb urine dipstick: NEGATIVE
Ketones, ur: NEGATIVE mg/dL
Leukocytes,Ua: NEGATIVE
Nitrite: NEGATIVE
Protein, ur: NEGATIVE mg/dL
Specific Gravity, Urine: 1.017 (ref 1.005–1.030)
pH: 5 (ref 5.0–8.0)

## 2021-03-31 LAB — CREATININE, URINE, RANDOM: Creatinine, Urine: 206.92 mg/dL

## 2021-03-31 LAB — RESP PANEL BY RT-PCR (FLU A&B, COVID) ARPGX2
Influenza A by PCR: NEGATIVE
Influenza B by PCR: NEGATIVE
SARS Coronavirus 2 by RT PCR: NEGATIVE

## 2021-03-31 LAB — TSH: TSH: 6.243 u[IU]/mL — ABNORMAL HIGH (ref 0.350–4.500)

## 2021-03-31 LAB — MAGNESIUM
Magnesium: 1.9 mg/dL (ref 1.7–2.4)
Magnesium: 1.9 mg/dL (ref 1.7–2.4)

## 2021-03-31 LAB — SODIUM, URINE, RANDOM: Sodium, Ur: 45 mmol/L

## 2021-03-31 LAB — MRSA NEXT GEN BY PCR, NASAL: MRSA by PCR Next Gen: DETECTED — AB

## 2021-03-31 LAB — PROCALCITONIN: Procalcitonin: <0.1 ng/mL

## 2021-03-31 LAB — FERRITIN: Ferritin: 23 ng/mL (ref 11–307)

## 2021-03-31 LAB — TROPONIN I (HIGH SENSITIVITY): Troponin I (High Sensitivity): 22 ng/L — ABNORMAL HIGH (ref <18)

## 2021-03-31 MED ORDER — CARVEDILOL 25 MG PO TABS
25.0000 mg | ORAL_TABLET | Freq: Two times a day (BID) | ORAL | Status: DC
  Administered 2021-03-31 – 2021-04-02 (×3): 25 mg via ORAL
  Filled 2021-03-31 (×4): qty 1

## 2021-03-31 MED ORDER — GABAPENTIN 300 MG PO CAPS: 300.0000 mg | ORAL_CAPSULE | Freq: Every day | ORAL | Status: DC

## 2021-03-31 MED ORDER — FERROUS SULFATE 325 (65 FE) MG PO TABS
325.0000 mg | ORAL_TABLET | Freq: Every day | ORAL | Status: DC
  Filled 2021-03-31: qty 1

## 2021-03-31 MED ORDER — NALOXONE HCL 0.4 MG/ML IJ SOLN
0.4000 mg | INTRAMUSCULAR | Status: DC | PRN
Start: 2021-03-31 — End: 2021-04-02

## 2021-03-31 MED ORDER — UMECLIDINIUM BROMIDE 62.5 MCG/ACT IN AEPB
1.0000 | INHALATION_SPRAY | Freq: Every day | RESPIRATORY_TRACT | Status: DC
  Administered 2021-03-31 – 2021-04-01 (×2): 1 via RESPIRATORY_TRACT
  Filled 2021-03-31: qty 7

## 2021-03-31 MED ORDER — FERROUS SULFATE 325 (65 FE) MG PO TABS: 325.0000 mg | ORAL_TABLET | ORAL | Status: DC

## 2021-03-31 MED ORDER — FLUTICASONE FUROATE-VILANTEROL 200-25 MCG/ACT IN AEPB
1.0000 | INHALATION_SPRAY | Freq: Every day | RESPIRATORY_TRACT | Status: DC
  Administered 2021-03-31 – 2021-04-01 (×2): 1 via RESPIRATORY_TRACT
  Filled 2021-03-31: qty 28

## 2021-03-31 MED ORDER — DICLOFENAC SODIUM 1 % EX GEL
2.0000 g | Freq: Four times a day (QID) | CUTANEOUS | Status: DC
  Administered 2021-03-31 – 2021-04-02 (×7): 2 g via TOPICAL
  Filled 2021-03-31: qty 100

## 2021-03-31 MED ORDER — ROPINIROLE HCL 0.25 MG PO TABS
0.2500 mg | ORAL_TABLET | Freq: Every day | ORAL | Status: DC | PRN
  Filled 2021-03-31: qty 1

## 2021-03-31 MED ORDER — PREGABALIN 25 MG PO CAPS
25.0000 mg | ORAL_CAPSULE | Freq: Two times a day (BID) | ORAL | Status: DC
  Administered 2021-03-31 – 2021-04-02 (×4): 25 mg via ORAL
  Filled 2021-03-31 (×4): qty 1

## 2021-03-31 MED ORDER — ROSUVASTATIN CALCIUM 20 MG PO TABS
20.0000 mg | ORAL_TABLET | Freq: Every day | ORAL | Status: DC
  Administered 2021-03-31 – 2021-04-01 (×3): 20 mg via ORAL
  Filled 2021-03-31 (×3): qty 1

## 2021-03-31 MED ORDER — ASPIRIN EC 81 MG PO TBEC
81.0000 mg | DELAYED_RELEASE_TABLET | Freq: Every day | ORAL | Status: DC
  Administered 2021-03-31 – 2021-04-02 (×3): 81 mg via ORAL
  Filled 2021-03-31 (×3): qty 1

## 2021-03-31 MED ORDER — LACTATED RINGERS IV SOLN
INTRAVENOUS | Status: AC
Start: 2021-03-31 — End: 2021-03-31

## 2021-03-31 MED ORDER — HEPARIN SODIUM (PORCINE) 5000 UNIT/ML IJ SOLN
5000.0000 [IU] | Freq: Three times a day (TID) | INTRAMUSCULAR | Status: DC
  Administered 2021-03-31 – 2021-04-02 (×7): 5000 [IU] via SUBCUTANEOUS
  Filled 2021-03-31 (×7): qty 1

## 2021-03-31 MED ORDER — ORAL CARE MOUTH RINSE
15.0000 mL | Freq: Two times a day (BID) | OROMUCOSAL | Status: DC
  Administered 2021-03-31 – 2021-04-02 (×5): 15 mL via OROMUCOSAL

## 2021-03-31 NOTE — Evaluation (Signed)
Physical Therapy Evaluation ?Patient Details ?Name: [name] ?MRN: [id number] ?DOB: 29-Apr-1945 ?Today's Date: 03/31/2021 ? ?History of Present Illness ? Patient is a 76 y/o female who presents on 03/30/21 from SNF with AMS, SOB and abnormal labs.  Found to have AKI and hypotension. PMH includes Covid 19 in 2020 with resipiratory failure, HF, DM, HTN, OSA on CPAP, morbod obesity, chronic peripheral edema, chronic venous stasis wound in LLE.  ?Clinical Impression ? Patient presents with generalized weakness, BLE pain, dyspnea on exertion, deconditioning, decreased activity tolerance and impaired mobility s/p above.  Pt is from [facility] and reports walking with rollator PTA. Has needed some assist with ADLs and has been working with PT/OT. Today, pt requires Min A for all mobility with increased time and use of RW for support. Tolerated SPT to chair. Due to deconditioning and weakness, recommend return to SNF to maximize independence and mobility. Will follow acutely. ?   ? ?Recommendations for follow up therapy are one component of a multi-disciplinary discharge planning process, led by the attending physician.  Recommendations may be updated based on patient status, additional functional criteria and insurance authorization. ? ?Follow Up Recommendations Skilled nursing-short term rehab (<3 hours/day) ? ?  ?Assistance Recommended at Discharge Frequent or constant Supervision/Assistance  ?Patient can return home with the following ? A little help with walking and/or transfers;A little help with bathing/dressing/bathroom ? ?  ?Equipment Recommendations None recommended by PT  ?Recommendations for Other Services ?    ?  ?Functional Status Assessment Patient has had a recent decline in their functional status and demonstrates the ability to make significant improvements in function in a reasonable and predictable amount of time.  ? ?  ?Precautions / Restrictions Precautions ?Precautions: Fall;Other  (comment) ?Precaution Comments: LLE wound, 02, watch BP ?Restrictions ?Weight Bearing Restrictions: No  ? ?  ? ?Mobility ? Bed Mobility ?Overal bed mobility: Needs Assistance ?Bed Mobility: Rolling, Sidelying to Sit ?Rolling: Min assist ?Sidelying to sit: Min assist, HOB elevated ?  ?  ?  ?General bed mobility comments: Cues for sequencing and to reach for rail for support/assist, increased time. Cues to breathe. ?  ? ?Transfers ?Overall transfer level: Needs assistance ?Equipment used: Rolling walker (2 wheels) ?Transfers: Sit to/from Stand, Bed to chair/wheelchair/BSC ?Sit to Stand: Min assist ?  ?Step pivot transfers: Min assist ?  ?  ?  ?General transfer comment: Min A to power to standing from EOB x1, step pivot transfer to chair with Min A for balance/RW management. 2/4 DOE. ?  ? ?Ambulation/Gait ?  ?  ?  ?  ?  ?  ?  ?General Gait Details: Deferred ? ?Stairs ?  ?  ?  ?  ?  ? ?Wheelchair Mobility ?  ? ?Modified Rankin (Stroke Patients Only) ?  ? ?  ? ?Balance Overall balance assessment: Needs assistance ?Sitting-balance support: Feet supported, No upper extremity supported ?Sitting balance-Leahy Scale: Fair ?  ?  ?Standing balance support: During functional activity, Reliant on assistive device for balance ?Standing balance-Leahy Scale: Poor ?  ?  ?  ?  ?  ?  ?  ?  ?  ?  ?  ?  ?   ? ? ? ?Pertinent Vitals/Pain Pain Assessment ?Pain Assessment: Faces ?Faces Pain Scale: Hurts even more ?Pain Location: BLEs, LLE>RLE ?Pain Descriptors / Indicators: Sore, Discomfort ?Pain Intervention(s): Monitored during session, Repositioned  ? ? ?Home Living Family/patient expects to be discharged to:: Skilled nursing facility ?  ?  ?  ?  ?  ?  ?  ?  ?  ?   ?  ?  Prior Function Prior Level of Function : Needs assist ?  ?  ?  ?Physical Assist : ADLs (physical);Mobility (physical) ?Mobility (physical): Gait ?ADLs (physical): Bathing;Dressing;Toileting ?Mobility Comments: pt reports walking limited distance in room with rollator, power  chair. Working with PT/OT ?ADLs Comments: meals brought to room, assist for laundry and cleaning, as well as cream for her feet. PT bathes on bench with hand held shower and long handled sponge ?  ? ? ?Hand Dominance  ? Dominant Hand: Right ? ?  ?Extremity/Trunk Assessment  ? Upper Extremity Assessment ?Upper Extremity Assessment: Defer to OT evaluation ?  ? ?Lower Extremity Assessment ?Lower Extremity Assessment: Generalized weakness (Limited hip flexion AROM bilaterally due to body habitus) ?  ? ?   ?Communication  ? Communication: No difficulties  ?Cognition Arousal/Alertness: Awake/alert ?Behavior During Therapy: [facility] for tasks assessed/performed ?Overall Cognitive Status: Within Functional Limits for tasks assessed ?  ?  ?  ?  ?  ?  ?  ?  ?  ?  ?  ?  ?  ?  ?  ?  ?  ?  ?  ? ?  ?General Comments General comments (skin integrity, edema, etc.): Incontinent of urine upon standing. ? ?  ?Exercises General Exercises - Lower Extremity ?Long Arc Quad: AROM, Both, 5 reps, Seated  ? ?Assessment/Plan  ?  ?PT Assessment Patient needs continued PT services  ?PT Problem List Decreased strength;Decreased mobility;Cardiopulmonary status limiting activity;Decreased activity tolerance;Decreased balance;Pain;Decreased range of motion ? ?   ?  ?PT Treatment Interventions Therapeutic exercise;Patient/family education;Therapeutic activities;Functional mobility training;Balance training;Gait training;DME instruction   ? ?PT Goals (Current goals can be found in the Care Plan section)  ?Acute Rehab PT Goals ?Patient Stated Goal: get out of bed ?PT Goal Formulation: With patient ?Time For Goal Achievement: 04/14/21 ?Potential to Achieve Goals: Fair ? ?  ?Frequency Min 2X/week ?  ? ? ?Co-evaluation PT/OT/SLP Co-Evaluation/Treatment: Yes ?Reason for Co-Treatment: To address functional/ADL transfers;For patient/therapist safety ?  ?  ?  ? ? ?  ?AM-PAC PT "6 Clicks" Mobility  ?Outcome Measure Help needed turning from your back to your side  while in a flat bed without using bedrails?: A Little ?Help needed moving from lying on your back to sitting on the side of a flat bed without using bedrails?: A Little ?Help needed moving to and from a bed to a chair (including a wheelchair)?: A Little ?Help needed standing up from a chair using your arms (e.g., wheelchair or bedside chair)?: A Little ?Help needed to walk in hospital room?: A Lot ?Help needed climbing 3-5 steps with a railing? : A Lot ?6 Click Score: 16 ? ?  ?End of Session Equipment Utilized During Treatment: Oxygen ?Activity Tolerance: Patient limited by fatigue;Patient tolerated treatment well ?Patient left: in chair;with call bell/phone within reach;with chair alarm set ?Nurse Communication: Mobility status;Other (comment) (purewick) ?PT Visit Diagnosis: Pain;Muscle weakness (generalized) (M62.81);Difficulty in walking, not elsewhere classified (R26.2) ?Pain - Right/Left:  (bil) ?Pain - part of body: Leg ?  ? ?Time: 6659-9357 ?PT Time Calculation (min) (ACUTE ONLY): 25 min ? ? ?Charges:   PT Evaluation ?$PT Eval Moderate Complexity: 1 Mod ?  ?  ?   ? ? ?Marisa Severin, PT, DPT ?Acute Rehabilitation Services ?Pager [phone] ?Office [phone] ? ? ? ? ?[place] ?03/31/2021, 12:59 PM ? ?

## 2021-03-31 NOTE — Assessment & Plan Note (Signed)
#)   Acute encephalopathy: Patient is reportedly exhibited 1 day of confusion, which may be multifactorial in nature, with suspected contribution from pharmacologic factors, namely recent initiation of oxycodone, particularly in the setting of interval decline in renal clearance thereof in the setting of concomitant presenting acute kidney injury.  There also may be an element of generalized systemic hypoperfusion, given improving mental status with increasing blood pressure with IV fluids in the ED this evening after initial hypotensive value, as above.  No evidence of overt underlying infectious process at this time, including chest x-ray demonstrated no evidence of infiltrate.  COVID-19/influenza PCR results pending at this time.  We will also check urinalysis.  Of note, she has a chronic left lower extremity venous stasis wound, which appears stable, without any evidence of recent exacerbation.  Additionally, suspect contribution from hyperglycemia, with initial blood sugar in the 40s.  ?  ?Plan: Hold home oxycodone.  Gentle overnight IV fluids, as above.  Further evaluation and management of presenting hypoglycemia, as below.  Check urinalysis.  Follow-up result of COVID-19/influenza PCR.  Add on procalcitonin.  Check CPK, TSH, MMA. ?

## 2021-03-31 NOTE — Assessment & Plan Note (Signed)
#)   Hyperlipidemia: documented h/o such. On high intensity rosuvastatin as outpatient.    Plan: continue home statin.    

## 2021-03-31 NOTE — Evaluation (Signed)
Occupational Therapy Evaluation ?Patient Details ?Name: [name] ?MRN: [id number] ?DOB: 1945-10-25 ?Today's Date: 03/31/2021 ? ? ?History of Present Illness 76 y/o female who presents on 03/30/21 from SNF with AMS, SOB and abnormal labs.  Found to have AKI and hypotension. PMH includes Covid 19 in 2020 with resipiratory failure, HF, DM, HTN, OSA on CPAP, morbod obesity, chronic peripheral edema, chronic venous stasis wound in LLE.  ? ?Clinical Impression ?  ?PTA, pt was living at [facility] and was independent with ADLs; since last admission, pt was at SNF side for rehab and receiving assistance for bathing due to BLE wounds. Pt currently requiring Min A for UB ADLs, Min-Max A for LB ADLs, and Min A for functional transfers with Rw.  Pt presenting with decreased activity tolerance and strength. Pt would benefit from further acute OT to facilitate safe dc. Recommend dc to SNF for further OT to optimize safety, independence with ADLs, and return to PLOF.  ?   ? ?Recommendations for follow up therapy are one component of a multi-disciplinary discharge planning process, led by the attending physician.  Recommendations may be updated based on patient status, additional functional criteria and insurance authorization.  ? ?Follow Up Recommendations ? Skilled nursing-short term rehab (<3 hours/day)  ?  ?Assistance Recommended at Discharge Frequent or constant Supervision/Assistance  ?Patient can return home with the following A little help with walking and/or transfers;A little help with bathing/dressing/bathroom;A lot of help with bathing/dressing/bathroom ? ?  ?Functional Status Assessment ? Patient has had a recent decline in their functional status and demonstrates the ability to make significant improvements in function in a reasonable and predictable amount of time.  ?Equipment Recommendations ? None recommended by OT  ?  ?Recommendations for Other Services PT consult ? ? ?  ?Precautions / Restrictions  Precautions ?Precautions: Fall;Other (comment) ?Precaution Comments: LLE wound, 02, watch BP ?Restrictions ?Weight Bearing Restrictions: No  ? ?  ? ?Mobility Bed Mobility ?Overal bed mobility: Needs Assistance ?Bed Mobility: Rolling, Sidelying to Sit ?Rolling: Min assist ?Sidelying to sit: Min assist, HOB elevated ?  ?  ?  ?General bed mobility comments: Cues for sequencing and to reach for rail for support/assist, increased time. Cues to breathe. ?  ? ?Transfers ?Overall transfer level: Needs assistance ?Equipment used: Rolling walker (2 wheels) ?Transfers: Sit to/from Stand, Bed to chair/wheelchair/BSC ?Sit to Stand: Min assist ?  ?  ?Step pivot transfers: Min assist ?  ?  ?General transfer comment: Min A to power to standing from EOB x1, step pivot transfer to chair with Min A for balance/RW management. 2/4 DOE. ?  ? ?  ?Balance Overall balance assessment: Needs assistance ?Sitting-balance support: Feet supported, No upper extremity supported ?Sitting balance-Leahy Scale: Fair ?  ?  ?Standing balance support: During functional activity, Reliant on assistive device for balance ?Standing balance-Leahy Scale: Poor ?  ?  ?  ?  ?  ?  ?  ?  ?  ?  ?  ?  ?   ? ?ADL either performed or assessed with clinical judgement  ? ?ADL Overall ADL's : Needs assistance/impaired ?Eating/Feeding: Set up;Sitting ?  ?Grooming: Dance movement psychotherapist;Set up;Sitting ?  ?Upper Body Bathing: Minimal assistance;Sitting ?  ?Lower Body Bathing: Minimal assistance ?  ?Upper Body Dressing : Minimal assistance;Sitting ?  ?Lower Body Dressing: Maximal assistance;Sit to/from stand ?  ?Toilet Transfer: Minimal assistance;Stand-pivot;+2 for safety/equipment;Rolling walker (2 wheels) ?  ?Toileting- Clothing Manipulation and Hygiene: Moderate assistance;Sit to/from stand ?Toileting - Clothing Manipulation Details (indicate  cue type and reason): "I was able to learn about the tiolet aid" reports pt. ?  ?  ?Functional mobility during ADLs: Minimal assistance;+2  for physical assistance;+2 for safety/equipment;Rolling walker (2 wheels) ?General ADL Comments: Pt with decreased strength, balance, and ROM  ? ? ? ?Vision   ?   ?   ?Perception   ?  ?Praxis   ?  ? ?Pertinent Vitals/Pain Pain Assessment ?Pain Assessment: Faces ?Faces Pain Scale: Hurts even more ?Pain Location: BLEs, LLE>RLE ?Pain Descriptors / Indicators: Sore, Discomfort ?Pain Intervention(s): Monitored during session, Limited activity within patient's tolerance, Repositioned  ? ? ? ?Hand Dominance Right ?  ?Extremity/Trunk Assessment Upper Extremity Assessment ?Upper Extremity Assessment: Generalized weakness (Limited shoulder ROM at baseline) ?  ?Lower Extremity Assessment ?Lower Extremity Assessment: Defer to PT evaluation ?  ?Cervical / Trunk Assessment ?Cervical / Trunk Assessment: Other exceptions ?Cervical / Trunk Exceptions: increased body habitus ?  ?Communication Communication ?Communication: No difficulties ?  ?Cognition Arousal/Alertness: Awake/alert ?Behavior During Therapy: [facility] for tasks assessed/performed ?Overall Cognitive Status: Within Functional Limits for tasks assessed ?  ?  ?  ?  ?  ?  ?  ?  ?  ?  ?  ?  ?  ?  ?  ?  ?  ?  ?  ?General Comments  Incontinent of urine upon standing. ? ?  ?Exercises Exercises: General Upper Extremity ?General Exercises - Upper Extremity ?Shoulder Flexion: AROM, Both, 10 reps, Seated ?General Exercises - Lower Extremity ?Long Arc Quad: AROM, Both, 5 reps, Seated ?  ?Shoulder Instructions    ? ? ?Home Living Family/patient expects to be discharged to:: Skilled nursing facility ?  ?  ?  ?  ?  ?  ?  ?  ?  ?  ?  ?  ?  ?  ?  ?  ?Additional Comments: [facility] - recently at rehab side ?  ? ?  ?Prior Functioning/Environment Prior Level of Function : Needs assist ?  ?  ?  ?Physical Assist : ADLs (physical);Mobility (physical) ?Mobility (physical): Gait ?ADLs (physical): Bathing;Dressing;Toileting ?Mobility Comments: pt reports walking limited distance in room with  rollator, power chair. Working with PT/OT ?ADLs Comments: meals brought to room, assist for laundry and cleaning, as well as cream for her feet. Pt bathes on bench with hand held shower and long handled sponge. Recently staff has been assisting with bathes due to wounds at BLEs and wrappings ?  ? ?  ?  ?OT Problem List: Decreased strength;Decreased range of motion;Decreased activity tolerance;Impaired balance (sitting and/or standing);Decreased knowledge of precautions;Decreased knowledge of use of DME or AE ?  ?   ?OT Treatment/Interventions: Self-care/ADL training;Therapeutic exercise;Energy conservation;DME and/or AE instruction;Therapeutic activities;Patient/family education  ?  ?OT Goals(Current goals can be found in the care plan section) Acute Rehab OT Goals ?Patient Stated Goal: Get stronger ?OT Goal Formulation: With patient ?Time For Goal Achievement: 04/14/21 ?Potential to Achieve Goals: Good  ?OT Frequency: Min 2X/week ?  ? ?Co-evaluation PT/OT/SLP Co-Evaluation/Treatment: Yes ?Reason for Co-Treatment: For patient/therapist safety;To address functional/ADL transfers ?  ?OT goals addressed during session: ADL's and self-care ?  ? ?  ?AM-PAC OT "6 Clicks" Daily Activity     ?Outcome Measure Help from another person eating meals?: None ?Help from another person taking care of personal grooming?: None ?Help from another person toileting, which includes using toliet, bedpan, or urinal?: A Lot ?Help from another person bathing (including washing, rinsing, drying)?: A Little ?Help from another person to put on and  taking off regular upper body clothing?: A Little ?Help from another person to put on and taking off regular lower body clothing?: A Lot ?6 Click Score: 18 ?  ?End of Session Equipment Utilized During Treatment: Rolling walker (2 wheels) ?Nurse Communication: Mobility status ? ?Activity Tolerance: Patient tolerated treatment well ?Patient left: in chair;with call bell/phone within reach;with chair  alarm set ? ?OT Visit Diagnosis: Unsteadiness on feet (R26.81);Other abnormalities of gait and mobility (R26.89);Muscle weakness (generalized) (M62.81)  ?              ?Time: 9628-3662 ?OT Time Calculation (min): 25 min ?C

## 2021-03-31 NOTE — TOC Initial Note (Signed)
Transition of Care (TOC) - Initial/Assessment Note  ? ? ?Patient Details  ?Name: [name] ?MRN: [id number] ?Date of Birth: 08/30/1945 ? ?Transition of Care (TOC) CM/SW Contact:    ?Angelita Ingles, RN ?Phone [phone] ? ?03/31/2021, 3:38 PM ? ?Clinical Narrative:                 ? ?Transition of Care (TOC) Screening Note ? ? ?Patient Details  ?Name: [name] ?Date of Birth: 18-Feb-1945 ? ? ?Transition of Care (TOC) CM/SW Contact:    ?Angelita Ingles, RN ?Phone Number: ?03/31/2021, 3:38 PM ? ? ? ?Transition of Care Department [facility]) has reviewed patient and no TOC needs have been identified at this time. We will continue to monitor patient advancement through interdisciplinary progression rounds. TOC acknowledges that patient is from [facility]. SW will follow for SNF needs. ? ? ? ?  ?  ? ? ?Patient Goals and CMS Choice ?  ?  ?  ? ?Expected Discharge Plan and Services ?  ?  ?  ?  ?  ?                ?  ?  ?  ?  ?  ?  ?  ?  ?  ?  ? ?Prior Living Arrangements/Services ?  ?  ?  ?       ?  ?  ?  ?  ? ?Activities of Daily Living ?Home Assistive Devices/Equipment: None ?ADL Screening (condition at time of admission) ?Patient's cognitive ability adequate to safely complete daily activities?: Yes ?Is the patient deaf or have difficulty hearing?: No ?Does the patient have difficulty seeing, even when wearing glasses/contacts?: No ?Does the patient have difficulty concentrating, remembering, or making decisions?: No ?Patient able to express need for assistance with ADLs?: Yes ?Does the patient have difficulty dressing or bathing?: Yes ?Independently performs ADLs?: Yes (appropriate for developmental age) ?Does the patient have difficulty walking or climbing stairs?: Yes ?Weakness of Legs: Both ?Weakness of Arms/Hands: Both ? ?Permission Sought/Granted ?  ?  ?   ?   ?   ?   ? ?Emotional Assessment ?  ?  ?  ?  ?  ?  ? ?Admission diagnosis:  Shortness of breath [R06.02] ?AKI (acute kidney injury) ([place])  [N17.9] ?Patient Active Problem List  ? Diagnosis Date Noted  ? Acute encephalopathy 03/31/2021  ? Hypoglycemia 03/31/2021  ? Hypotension 03/31/2021  ? Elevated troponin 03/31/2021  ? Acute on chronic anemia 03/31/2021  ? Chronic diastolic CHF (congestive heart failure) ([place])   ? AKI (acute kidney injury) ([place]) 03/30/2021  ? Leg wound, left 02/01/2021  ? SIRS (systemic inflammatory response syndrome) ([place]) 05/03/2020  ? DM2 (diabetes mellitus, type 2) ([place]) 05/03/2020  ? Chronic respiratory failure with hypoxia and hypercapnia ([place]) 05/03/2020  ? Cervical disc prolapse with radiculopathy 12/07/2019  ? History of pheochromocytoma 12/07/2019  ? 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 OSA on CPAP 12/07/2019  ? 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 Gait abnormality 06/19/2019  ? 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 TIA (transient ischemic attack) 03/22/2017  ? Acute on chronic diastolic CHF (congestive heart failure) ([place]) 03/09/2017  ? Diabetic hypoglycemia ([place]) 03/05/2014  ? 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 Dizzy 12/28/2011  ? CAD (coronary artery disease), native coronary artery 12/05/2010  ? Morbid obesity ([place]) 12/05/2010  ? Essential hypertension 12/03/2010  ? Hyperlipidemia 12/03/2010  ? UI (urinary incontinence) 12/03/2010  ? Chest pain 05/31/2006  ? ?  PCP:  [name], NP ?Pharmacy:   ?[facility] [address] ?[place] ?[place] [address] ?Phone: [phone] Fax: [phone] ? ? ? ? ?Social Determinants of Health (SDOH) Interventions ?  ? ?Readmission Risk Interventions ?No flowsheet data found. ? ? ?

## 2021-03-31 NOTE — ED Notes (Signed)
Breakfast order placed ?

## 2021-03-31 NOTE — Assessment & Plan Note (Signed)
#)   Acute kidney injury: Presenting creatinine 3.73 compared to baseline range of 1.0-1.3 and relative to most recent prior value of 1.16 on 03/04/2021.  In the setting of interval up-regulation of torsemide dose, suspect contribution from intravascular depletion on the basis of this increase in diuresis efforts.  Cardiorenal syndrome appears less likely at this time, without overt evidence of acute decompensated heart failure at this time, including stable BNP, no evidence of interstitial/pulmonary edema on chest x-ray, as well as the patient's report of no recent worsening of shortness of breath, as well as stable baseline supplemental oxygen requirements as quantified above.  There may also be a multiple pharmacologic contributions, including patient's report of interval initiation of oxycodone, with associated nephrotoxic side effects, particularly in the context of diminishing renal clearance of oxycodone with worsening creatinine clearance over the last month.  Outpatient medications also notable for lisinopril.  Suspect element of dehydration.  She was supported by improving hypotension with initiation of IV fluids in the emergency department, as further quantified above.  ?  ?Plan: Continuous LR at 100 cc/h x 8 hours.  Monitor strict I's and O's and daily weights.  Attempt avoid nephrotoxic agents, including holding of oxycodone as well as lisinopril.  Check urinalysis with microscopy.  Add on random urine sodium as well as random urine creatinine.  Check CPK level.  Repeat BMP in the morning.  Holding home torsemide for now. ?  ?  ?

## 2021-03-31 NOTE — Assessment & Plan Note (Signed)
#)   Chronic diastolic heart failure: documented history of such, with most recent echocardiogram performed January 2023, as further outlined above. No clinical evidence to suggest acutely decompensated heart failure at this time, including stable BNP, chest x-ray showing no evidence of interstitial/pulmonary edema nor any evidence of infiltrate or pleural effusion, while patient denies any recent worsening of her shortness of breath, and O2 sats stable relative to her baseline supplemental oxygen requirements of 6 L continuous nasal cannula.  This is in the setting of recent increase in torsemide dose as an outpatient following most recent prior serum creatinine data point on 03/04/2021, with suspicion for slight overdiuresis as a consequence in the interval.  Resultantly, will hold home torsemide for now, provide gentle IV fluids overnight, reassess volume status and renal function in the morning to help guide timing of resumption of outpatient torsemide. ?  ?  ?Plan: monitor strict I's & O's and daily weights. Repeat BMP in AM. Check serum mag level.  Hold home diuretic regimen for now, as above.  ?  ?

## 2021-03-31 NOTE — Progress Notes (Signed)
Mobility Specialist Progress Note: ? ? 03/31/21 1450  ?Mobility  ?Activity Ambulated with assistance in room  ?Level of Assistance Minimal assist, patient does 75% or more  ?Assistive Device Front wheel walker  ?Distance Ambulated (ft) 10 ft  ?Activity Response Tolerated fair  ?$Mobility charge 1 Mobility  ? ?Pt received in chair, agreeable to mobility session. Required minA to stand from chair, took 5 steps forward and back. Pt declined further ambulation d/t L hip pain. Left in chair with all needs met.  ? ?Nelta Numbers ?Acute Rehab ?Phone: [phone] ?Office Phone: [phone] ? ?

## 2021-03-31 NOTE — Progress Notes (Signed)
ACP Note: ? ?Per my discussions with the patient today, the patient has expressed her wishes to be DNR.  She would not want chest compressions, antiarrhythmics, electrical shocks, or intubation in the setting of cardiac arrest.  However, patient also conveys that she would be amenable to intubation in a non-cardiac arrest scernario in which there is primary pulmonary indication for such. ? ? ? ?Babs Bertin, DO ?Hospitalist ? ?

## 2021-03-31 NOTE — Assessment & Plan Note (Signed)
#)   Hypoglycemia: In the setting of documented history of type 2 diabetes mellitus, with outpatient insulin regimen reportedly consisting of 70/30 insulin at 73 units 3 times daily, scheduled, the patient's presenting blood sugar noted to be 46.  This is in the context of most recent hemoglobin A1c of 7.2% in January 2023.  Suspect contribution towards presenting hyperglycemia from recent decline in oral intake in the setting of acute encephalopathy with influences from recent initiation of oxycodone.  Blood sugar improving with food and orange juice.  We will closely monitor ensuing blood sugar via measures as outlined below. ?  ?Plan: CBG every hour x2 occurrences.  Hypoglycemic protocol initiated, including prn amp D50 for ensuing CBG less than 70.  Holding home scheduled 70/30 insulin for now.  Accu-Cheks before every meal and at bedtime with very low-dose sliding scale insulin.  Anticipate needing to increase her sliding scale coverage as mental status improves resulting in corresponding increase in oral intake. ?  ?  ?  ?

## 2021-03-31 NOTE — Assessment & Plan Note (Signed)
#)   Elevated troponin: Presenting high-sensitivity troponin I found to be 21, which is trending down relative to most recent prior value of 81 from 02/01/2021.  In the context of initial hypotension, will continue to trend troponin, although ACS is felt to be less likely at this time, as further detailed above.  A subsequent increase in troponin is possible, given initial hypotension as well as progressive decline in associated renal clearance in the setting of acute kidney injury, as above. ?  ?Plan: Continue to trend troponin.  Monitor on telemetry.  Repeat CMP in the morning. ?  ?

## 2021-03-31 NOTE — Consult Note (Addendum)
WOC Nurse Consult Note: ?Reason for Consult: Consult requested for bilat legs and breast folds. ?Wound type: Left posterior leg with chronic full thickness stasis ulcer; 6X6X.2cm, red and moist and painful, mod amt yellow drainage. ?Right anterior leg with partial thickness stasis ulcer; 4X4X.1cm, red and moist, small amt yellow drainage ?Right lower anterior calf with dry yellow scabs. ?Bilat legs with generalized edema and erythremia ?Sacrum/bilat buttocks/inner gluteal fold without any pressure injuries.  There is a .2X.2cm "dimple" area located just above sacrum, no open wound, drainage, or fluctuance, appearance is consistent with a small narrow valley of intact skin. ?Bilat skin folds below breasts are red moist macerated and painful with partial thickness skin loss and mod amt yellow drainage.  Appearance is consistent with moisture associated skin damage. ? ?ICD-10 CM Codes for Irritant Dermatitis ?L30.4  - Erythema intertrigo; chafing of the skin, dermatitis due to sweating and friction ? ?Dressing procedure/placement/frequency: Topical treatment orders provided for bedside nurses to perform as follows:  ?1. Apply xeroform gauze to left posterior leg Q day, then cover with ABD pads and kerlex ?2. Foam dressing to sacrum and right leg, change Q 3 days or PRN soiling. ?3. Measure and cut length of InterDry to fit in skin folds that have skin breakdown  ?Tuck InterDry fabric into skin folds in a single layer, allow for 2 inches of overhang from skin edges to allow for wicking to occur ?May remove to bathe; dry area thoroughly and then tuck into affected areas again  ?Do not apply any creams or ointments when using InterDry ?DO NOT THROW AWAY FOR 5 DAYS unless soiled with stool ?DO NOT [facility] product, this will inactivate the silver in the material  ?New sheet of Interdry should be applied after 5 days of use if patient continues to have skin breakdown ?Discontinue use of current sheet after 3/18 ? ?Please  re-consult if further assistance is needed.  Thank-you,  ?Julien Girt MSN, RN, [place], [place], CNS ?[phone]  ? ?  ?

## 2021-03-31 NOTE — H&P (Signed)
?History and Physical  ? ? ?PLEASE NOTE THAT DRAGON DICTATION SOFTWARE WAS USED IN THE CONSTRUCTION OF THIS NOTE. ? ? ?[name] NTZ:[id number] DOB: 22-Nov-1945 DOA: 03/30/2021 ? ?PCP: [name], NP  ?Patient coming from: SNF ? ?I have personally briefly reviewed patient's old medical records in [place] ? ?Chief Complaint: Altered mental status ? ?HPI: [name] is a 76 y.o. 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 ? ?The following history was provided via my discussions with the patient as well as my discussions with the EDP and via chart review. ? ?The patient reportedly has been exhibiting progressive confusion over the course of the last day.  In an effort to further optimize pain control as relates to her chronic left lower extremity venous stasis ulcer, she is reportedly been started on oxycodone over the preceding weeks, with at least 1 additional dose administered earlier today.  SNF staff reportedly noted the patient to exhibit progressive confusion throughout the day, as well as some degree of diminished responsiveness, ultimately prompting the patient to be brought to [facility] emergency department for further evaluation and management thereof.  ? ?Aside from left lower extremity discomfort associated with her chronic venous stasis wound, the patient is without acute complaint at this time.  She reports that her mild shortness of breath is baseline for her, without any recent worsening thereof.  She also denies any associated chest pain, palpitations,  diaphoresis, nausea, vomiting, dizziness, presyncope, or syncope.  No new cough, hemoptysis, or wheezing.  She notes that the degree of edema in the bilateral lower extremities is unchanged over the last week, including no associated recent worsening.  She also notes no recent worsening in the distribution or associated erythema relating to her chronic left lower extremity venous stasis wound.  Denies any recent subjective fever, chills, rigors, or generalized myalgias.  No recent dysuria or gross hematuria. No abdominal pain or new rash. ? ?She has a documented history of chronic diastolic heart failure, with most recent echocardiogram on 02/02/2021 showing LVEF greater than 75% without evidence of focal wall motion abnormalities, as well as showing indeterminate diastolic parameters and no evidence of significant valvular pathology.  Preceding echocardiogram occurred in August 2021 was notable for LVEF 55%, no evidence of a wall motion abnormalities, while showing grade 2 diastolic dysfunction.  Per chart review, and per the patient's report, she has been working closely with her outpatient cardiologist and attempt to reduce some of the edema in her bilateral lower extremities, which she has chronically.  Consequently, they have been modifications to her outpatient torsemide dose over the last few weeks, including at times, increasing this medication from daily to 3 times daily, responding, at times, to 150 mg of torsemide on a daily basis.  ? ?Per chart review, baseline creatinine ranges 1.0-1.3, with most recent prior value noted to be 1.16 on 03/04/2021.  Additionally, she has a history of chronic iron deficiency anemia, associated baseline hemoglobin 9-11.  She denies any recent melena or hematochezia. ? ? ? ? ?ED Course:  ?Vital signs in the ED were notable for the following: Temperature max 98.3; initial  heart rate 80, which decreased into the 70s following initiation of IV fluids, as further detailed below;  initial blood pressure 1 082/51, although repeat showed trend down to 72/37, before trending up to 88/42 following interval administration of IV fluids; respiratory rate 15-20, oxygen saturation 97-1 her percent on baseline 6 L continuous nasal cannula. ? ?Labs were notable for the following: VBG notable for 7.361/50.5; CMP notable for the following: Bicarbonate 26, BUN 95, creatinine 3.53, BUN/creatinine ratio 27, liver enzymes within normal limits per CBC notable for white cell count 8500, hemoglobin 8.0 associated with normocytic/normochromic findings, relative to most recent prior hemoglobin of 9.4 on 02/04/2021, platelet count 200.  BNP 116, relative to 113 on 01/31/2021, high-sensitivity troponin I 21, compared to 81 on 02/01/2021.  COVID-19/flu PCR results currently pending.  CBG 46.  ? ?Imaging and additional notable ED work-up: EKG, which has not yet been released, but reported by EDP to be consistent with sinus rhythm without evidence of acute ischemic changes.  Chest x-ray shows cardiomegaly with vascular congestion in the absence of any evidence of interstitial edema, pulmonary edema, infiltrate, effusion, or pneumothorax. ? ?While in the ED, the following were administered: LR x1 L bolus; Norco 5/325 mg p.o. x2 tabs. ? ?Subsequently, the patient was admitted for further evaluation and management of acute kidney injury, with presentation also notable for hypoglycemia, initial hypotension, acute on chronic anemia as well as suspected acute encephalopathy after presenting for evaluation of 1 day of confusion. ? ? ? ? ? ?Review of Systems: As per HPI otherwise 10 point review of systems negative.  ? ?Past Medical History:  ?Diagnosis Date  ? Coronary artery disease   ? Diabetes mellitus without complication ([place])   ? Hyperlipidemia   ? Hypertension   ? OSA (obstructive sleep apnea)   ? ? ?Past Surgical History:  ?Procedure Laterality Date  ? CARDIAC CATHETERIZATION    ? pheochromocytoma    ? TONSILLECTOMY     ? ? ?Social History: ? reports that she quit smoking about 31 years ago. Her smoking use included cigarettes. She has a 20.00 pack-year smoking history. She has never used smokeless tobacco. She reports that she does not currently use alcohol. She reports that she does not currently use drugs. ? ? ?Allergies  ?Allergen Reactions  ? Metaxalone Anaphylaxis  ? Nitroglycerin   ?  BP bottoms out  ? Tramadol-Acetaminophen Anaphylaxis  ? Icosapent Ethyl Other (See Comments)  ?  Stomach pain, gas, diarrhea, headache from Vascepa  ? Brompheniramine-Pseudoeph   ?  Feels drunk  ? Meprobamate   ? Metformin Nausea Only  ?  "flushes"  ? Methyldopa   ?  Other reaction(s): Unknown  ? Metoprolol   ?  Other reaction(s): Unknown  ? Procaine   ?  Passes out  ? Pseudoephedrine Hcl   ?  "feel drunk"  ? Tramadol   ?  "Made me go wonky, got fluid in my lungs"  ? ? ?Family History  ?Problem Relation Age of Onset  ? Heart attack Mother   ? Stroke Mother   ? Heart attack Father   ? ? ?Family history reviewed and not pertinent  ? ? ?Prior to Admission medications   ?Medication Sig Start Date End Date Taking? Authorizing Provider  ?acetaminophen (TYLENOL) 500 MG tablet Take 500 mg by mouth every 8 (eight) hours as needed for moderate pain or headache.    [provider]  ?albuterol (VENTOLIN HFA) 108 (90 Base) MCG/ACT inhaler Inhale 2 puffs  into the lungs 4 (four) times daily as needed for wheezing or shortness of breath. ?Patient not taking: Reported on 03/10/2021    [provider]  ?amLODipine (NORVASC) 5 MG tablet Take 5 mg by mouth daily.    [provider]  ?aspirin EC 81 MG tablet Take 81 mg by mouth daily. Swallow whole.    [provider]  ?[place]air Patter 200-25 MCG/INH AEPB Inhale 1 puff into the lungs daily. 04/22/20   [provider]  ?carvedilol (COREG) 25 MG tablet Take 25 mg by mouth 2 (two) times daily with a meal.    [provider]  ?cephALEXin (KEFLEX) 500 MG capsule Take 1  capsule (500 mg total) by mouth 3 (three) times daily. 03/16/21   Criselda Peaches, DPM  ?cetirizine (ZYRTEC) 10 MG tablet Take 10 mg by mouth daily.    [provider]  ?Cholecalciferol (VITAMIN D3)

## 2021-03-31 NOTE — Progress Notes (Signed)
MD notified of pt's crying out for pain and very anxious, MD will assess pt first, pt updated on MD making rounds. Pt continues to cry out loud due to back pain, pt repositioned for comfort in bed. ?

## 2021-03-31 NOTE — Assessment & Plan Note (Signed)
?#)   Acute on chronic anemia: In the setting of a documented history of chronic iron deficiency anemia with associated baseline hemoglobin range of 9-11, presenting hemoglobin noted to be slightly lower at 8.0.  No evidence of active bleed.  Rather, suspect contribution from interval decline in renal function, which appears consistent with the normocytic/normochromic nature of the patient's presenting hemoglobin.  With initial hypotension improving with IV fluids, and the patient otherwise asymptomatic at this time, there does not appear to be a current indication for initiation of PBC transfusion. ?  ?Plan: Repeat CBC in the morning.  Add on the following: Iron studies, MMA, folic acid level, reticulocyte count.  Check INR.  Further evaluation and management of AKI, as above. ?  ?

## 2021-03-31 NOTE — Progress Notes (Signed)
?PROGRESS NOTE ? ? ? ?[name]  ZSW:[id number] DOB: 08-20-1945 DOA: 03/30/2021 ?PCP: [name], NP ? ?Brief Narrative:  ?[name] is a 76 y.o. female with medical history significant for chronic hypoxic respiratory failure on continuous 6 L nasal cannula, chronic diastolic heart failure, chronic peripheral edema, chronic venous stasis wound in bilateral lower extremity, type 2 diabetes mellitus, hypertension, hyperlipidemia, chronic anemia with baseline hemoglobin 9-11, who is admitted to [facility] on 03/30/2021 with altered mental status and acute kidney injury in the setting of polypharmacy. ? ?Assessment & Plan: ?  ?Principal Problem: ?  AKI (acute kidney injury) ([place]) ?Active Problems: ?  Hyperlipidemia ?  Acute encephalopathy ?  Hypoglycemia ?  Hypotension ?  Elevated troponin ?  Acute on chronic anemia ?  Chronic diastolic CHF (congestive heart failure) ([place]) ? ?Acute metabolic encephalopathy, transient in the setting of polypharmacy, POA ?-Continue to hold narcotics, mental status depressants ?-Patient declines being on gabapentin despite her med rec list, will transition to Lyrica in hopes for lower extremity paresthesias and nerve pain ?-Discussed polypharmacy with patient at length, may be able to reinitiate low-dose narcotics in the next 24 to 48 hours pending clinical course ? ?AKI, 2/2 above, poor PO intake  ?-Continue to advance diet as tolerated ?-Creatinine earlier this year ranging 0.9-1.3 ?-Appears to be slowly worsening over the past few weeks, unclear if progressing chronic kidney disease versus acute injury secondary to dehydration poor p.o. intake ?-Patient received IV fluids at intake, follow repeat labs ? ?Uncontrolled diabetes type 2 insulin-dependent with hypoglycemia  ?-On 70/30 requiring 73 units 3 times daily ?-A1c 7.2 January, no indication to retest ? ?Hypotension 2/2 above polypharmacy ?-Improving, hold home blood pressure medications until  indicated ? ?Troponin elevation rule out ACS ?-Likely secondary to hypotension, without complaints of chest pain dyspnea or nausea ? ?Chronic anemia of chronic disease ?-No acute blood loss noted on exam, follow repeat labs ? ?HLD - continue statin, low-fat diet ?HF, Diastolic, without acute exacerbation -hold diuretics given above, received IV fluids at intake, hold further IV fluids to avoid volume overload ? ?DVT prophylaxis: Heparin ?Code Status: DNR ?Family Communication: None present ? ?Status is: Inpatient ? ?Dispo: The patient is from: Facility ?             Anticipated d/c is to: Same ?             Anticipated d/c date is: 48 to 72 hours ?             Patient currently not medically stable for discharge ? ?Consultants:  ?None ? ?Procedures:  ?None ? ?Antimicrobials:  ?None ? ?Subjective: ?Patient complaining of diffuse nerve/paresthesia pain of bilateral lower extremities requesting narcotics which we discussed was not appropriate.  Complaining of back pain and shortness of breath due to "being stuck in bed" otherwise denies nausea vomiting diarrhea constipation headache fevers chills or chest pain. ? ?Objective: ?Vitals:  ? 03/31/21 0130 03/31/21 0230 03/31/21 0530 03/31/21 0626  ?BP: (!) 104/54 (!) 103/56 125/71 (!) 127/50  ?Pulse: 83 74 83 88  ?Resp: '15 18 18 20  '$ ?Temp:   98.4 ?F (36.9 ?C) 98.5 ?F (36.9 ?C)  ?TempSrc:   Oral Oral  ?SpO2: 95% 99% 98% 93%  ?Weight:    (!) 155.8 kg  ?Height:    '5\' 2"'$  (1.575 m)  ? ?No intake or output data in the 24 hours ending 03/31/21 0733 ?Filed Weights  ? 03/31/21 0626  ?Weight: (!) 155.8 kg  ? ? ?  Examination: ? ?General: Pleasantly resting in bed, No acute distress. ?HEENT: Normocephalic atraumatic.  Sclerae nonicteric, noninjected.  Extraocular movements intact bilaterally. ?Neck: Without mass or deformity.  Trachea is midline. ?Lungs: Diminished bilaterally without overt rales rhonchi or wheeze. ?Heart: Regular rate and rhythm.  Without murmurs, rubs, or  gallops. ?Abdomen:  Soft, nontender, nondistended.  Without guarding or rebound. ?Extremities: Bilateral lower extremity wounds, chronic, bandages clean dry intact. ? ?Data Reviewed: I have personally reviewed following labs and imaging studies ? ?CBC: ?Recent Labs  ?Lab 03/30/21 ?2049 03/30/21 ?2302 03/31/21 ?8466  ?WBC 8.5  --  10.6*  ?NEUTROABS 7.3  --  8.8*  ?HGB 8.0* 8.5* 8.0*  ?HCT 26.2* 25.0* 26.4*  ?MCV 91.0  --  91.3  ?PLT 200  --  216  ? ?Basic Metabolic Panel: ?Recent Labs  ?Lab 03/30/21 ?2049 03/30/21 ?2250 03/30/21 ?2302 03/31/21 ?5993  ?NA 136  --  134* 136  ?K 4.8  --  4.7 4.7  ?CL 97*  --   --  98  ?CO2 26  --   --  25  ?GLUCOSE 99  --   --  56*  ?BUN 95*  --   --  94*  ?CREATININE 3.53*  --   --  3.25*  ?CALCIUM 8.7*  --   --  8.7*  ?MG  --  1.9  --  1.9  ? ?GFR: ?Estimated Creatinine Clearance: 22.5 mL/min (A) (by C-G formula based on SCr of 3.25 mg/dL (H)). ?Liver Function Tests: ?Recent Labs  ?Lab 03/30/21 ?2049 03/31/21 ?5701  ?AST 16 17  ?ALT 16 17  ?ALKPHOS 44 38  ?BILITOT 0.5 0.3  ?PROT 6.8 6.8  ?ALBUMIN 3.2* 3.2*  ? ?No results for input(s): LIPASE, AMYLASE in the last 168 hours. ?No results for input(s): AMMONIA in the last 168 hours. ?Coagulation Profile: ?Recent Labs  ?Lab 03/31/21 ?7793  ?INR 1.2  ? ?Cardiac Enzymes: ?Recent Labs  ?Lab 03/31/21 ?9030  ?CKTOTAL 217  ? ?BNP (last 3 results) ?No results for input(s): PROBNP in the last 8760 hours. ?HbA1C: ?No results for input(s): HGBA1C in the last 72 hours. ?CBG: ?Recent Labs  ?Lab 03/30/21 ?2329 03/31/21 ?0020 03/31/21 ?0923 03/31/21 ?3007  ?GLUCAP 46* 75 61* 52*  ? ?Lipid Profile: ?No results for input(s): CHOL, HDL, LDLCALC, TRIG, CHOLHDL, LDLDIRECT in the last 72 hours. ?Thyroid Function Tests: ?Recent Labs  ?  03/31/21 ?6226  ?TSH 6.243*  ? ?Anemia Panel: ?Recent Labs  ?  03/31/21 ?3335 03/31/21 ?4562  ?FOLATE 38.2  --   ?FERRITIN 23  --   ?TIBC 406  --   ?IRON 22*  --   ?RETICCTPCT  --  3.0  ? ?Sepsis Labs: ?Recent Labs  ?Lab  03/31/21 ?5638  ?PROCALCITON <0.10  ? ? ?Recent Results (from the past 240 hour(s))  ?Resp Panel by RT-PCR (Flu A&B, Covid) Nasopharyngeal Swab     Status: None  ? Collection Time: 03/30/21 11:54 PM  ? Specimen: Nasopharyngeal Swab; Nasopharyngeal(NP) swabs in vial transport medium  ?Result Value Ref Range Status  ? SARS Coronavirus 2 by RT PCR NEGATIVE NEGATIVE Final  ?  Comment: (NOTE) ?SARS-CoV-2 target nucleic acids are NOT DETECTED. ? ?The SARS-CoV-2 RNA is generally detectable in upper respiratory ?specimens during the acute phase of infection. The lowest ?concentration of SARS-CoV-2 viral copies this assay can detect is ?138 copies/mL. A negative result does not preclude SARS-Cov-2 ?infection and should not be used as the sole basis for treatment or ?other patient management  decisions. A negative result may occur with  ?improper specimen collection/handling, submission of specimen other ?than nasopharyngeal swab, presence of viral mutation(s) within the ?areas targeted by this assay, and inadequate number of viral ?copies(<138 copies/mL). A negative result must be combined with ?clinical observations, patient history, and epidemiological ?information. The expected result is Negative. ? ?Fact Sheet for Patients:  ?EntrepreneurPulse.com.au ? ?Fact Sheet for Healthcare Providers:  ?IncredibleEmployment.be ? ?This test is no t yet approved or cleared by the [place] FDA and  ?has been authorized for detection and/or diagnosis of SARS-CoV-2 by ?FDA under an Emergency Use Authorization (EUA). This EUA will remain  ?in effect (meaning this test can be used) for the duration of the ?COVID-19 declaration under Section 564(b)(1) of the Act, 21 ?U.S.C.section 360bbb-3(b)(1), unless the authorization is terminated  ?or revoked sooner.  ? ? ?  ? Influenza A by PCR NEGATIVE NEGATIVE Final  ? Influenza B by PCR NEGATIVE NEGATIVE Final  ?  Comment: (NOTE) ?The Xpert Xpress  SARS-CoV-2/FLU/RSV plus assay is intended as an aid ?in the diagnosis of influenza from Nasopharyngeal swab specimens and ?should not be used as a sole basis for treatment. Nasal washings and ?aspirates are unacceptable for Xpert Xpress SARS-CoV-2/FL

## 2021-03-31 NOTE — Assessment & Plan Note (Signed)
#)   Hypotension: In the setting of a documented history of essential hypertension, patient presents with initial hypotension, with blood pressures in the systolic 09G, subsequent improvement with IV fluids, suggestive of element of dehydration in setting of recent increase in diuresis efforts as an outpatient, as further outlined above.  Additionally, suspect contribution from oxycodone, particularly given recent worsening of renal function resulting in effective increase in circulating concentration of this medication, the hypotensive effect of which can be amplified in the setting of intravascular depletion.  No evidence of underlying infectious process to suggest an element of sepsis.  Patient denies any recent chest discomfort, and initial troponin continues to trend down from most recent prior visit, while EKG reportedly shows no evidence of acute ischemic changes.  ?  ?Plan: Continuous IV fluids overnight, as above.  Hold home oxycodone.  Continue to trend troponin.  Monitor on symmetry.  Repeat CMP and CBC in the morning.  Check urinalysis, procalcitonin.  Follow-up result of COVID-19/influenza PCR.  Hold home Coreg, Norvasc, lisinopril. ?  ?

## 2021-04-01 LAB — CBC
HCT: 27.2 % — ABNORMAL LOW (ref 36.0–46.0)
Hemoglobin: 8.4 g/dL — ABNORMAL LOW (ref 12.0–15.0)
MCH: 27.9 pg (ref 26.0–34.0)
MCHC: 30.9 g/dL (ref 30.0–36.0)
MCV: 90.4 fL (ref 80.0–100.0)
Platelets: 221 10*3/uL (ref 150–400)
RBC: 3.01 MIL/uL — ABNORMAL LOW (ref 3.87–5.11)
RDW: 17.5 % — ABNORMAL HIGH (ref 11.5–15.5)
WBC: 8 10*3/uL (ref 4.0–10.5)
nRBC: 0 % (ref 0.0–0.2)

## 2021-04-01 LAB — GLUCOSE, CAPILLARY
Glucose-Capillary: 160 mg/dL — ABNORMAL HIGH (ref 70–99)
Glucose-Capillary: 214 mg/dL — ABNORMAL HIGH (ref 70–99)
Glucose-Capillary: 259 mg/dL — ABNORMAL HIGH (ref 70–99)
Glucose-Capillary: 285 mg/dL — ABNORMAL HIGH (ref 70–99)

## 2021-04-01 LAB — BASIC METABOLIC PANEL
Anion gap: 9 (ref 5–15)
BUN: 81 mg/dL — ABNORMAL HIGH (ref 8–23)
CO2: 30 mmol/L (ref 22–32)
Calcium: 9.1 mg/dL (ref 8.9–10.3)
Chloride: 98 mmol/L (ref 98–111)
Creatinine, Ser: 1.93 mg/dL — ABNORMAL HIGH (ref 0.44–1.00)
GFR, Estimated: 27 mL/min — ABNORMAL LOW (ref >60)
Glucose, Bld: 168 mg/dL — ABNORMAL HIGH (ref 70–99)
Potassium: 4.6 mmol/L (ref 3.5–5.1)
Sodium: 137 mmol/L (ref 135–145)

## 2021-04-01 MED ORDER — BUTALBITAL-APAP-CAFFEINE 50-325-40 MG PO TABS: 1.0000 | ORAL_TABLET | ORAL | Status: DC | PRN

## 2021-04-01 MED ORDER — ACETAMINOPHEN 325 MG PO TABS
650.0000 mg | ORAL_TABLET | Freq: Four times a day (QID) | ORAL | Status: DC | PRN
Start: 2021-04-01 — End: 2021-04-02
  Administered 2021-04-01 – 2021-04-02 (×3): 650 mg via ORAL
  Filled 2021-04-01 (×3): qty 2

## 2021-04-01 MED ORDER — HYDROCODONE-ACETAMINOPHEN 5-325 MG PO TABS
1.0000 | ORAL_TABLET | Freq: Four times a day (QID) | ORAL | Status: DC | PRN
  Administered 2021-04-01 – 2021-04-02 (×3): 1 via ORAL
  Filled 2021-04-01 (×3): qty 1

## 2021-04-01 NOTE — TOC Initial Note (Signed)
Transition of Care (TOC) - Initial/Assessment Note  ? ? ?Patient Details  ?Name: [name] ?MRN: [id number] ?Date of Birth: 13-Jul-1945 ? ?Transition of Care (TOC) CM/SW Contact:    ?Coralee Pesa, LCSWA ?Phone Number: ?04/01/2021, 4:31 PM ? ?Clinical Narrative:                 ?CSW confirmed with [facility] that pt has been in SNF but is from [facility]. Plan is for pt to return to SNF and then be transitioned to ALF. Son confirmed plan. Pt will not need an auth, TOC will continue to follow. ? ?Expected Discharge Plan: [place] ?Barriers to Discharge: Continued Medical Work up ? ? ?Patient Goals and CMS Choice ?Patient states their goals for this hospitalization and ongoing recovery are:: Pt and family state their goal is for pt to regain some independent. ?CMS Medicare.gov Compare Post Acute Care list provided to:: Patient ?Choice offered to / list presented to : Patient, Adult Children ? ?Expected Discharge Plan and Services ?Expected Discharge Plan: [place] ?  ?  ?Post Acute Care Choice: [place] ?Living arrangements for the past 2 months: [place] ?                ?  ?  ?  ?  ?  ?  ?  ?  ?  ?  ? ?Prior Living Arrangements/Services ?Living arrangements for the past 2 months: [place] ?Lives with:: Facility Resident ?Patient language and need for interpreter reviewed:: Yes ?Do you feel safe going back to the place where you live?: Yes      ?Need for Family Participation in Patient Care: Yes (Comment) ?Care giver support system in place?: Yes (comment) ?  ?Criminal Activity/Legal Involvement Pertinent to Current Situation/Hospitalization: No - Comment as needed ? ?Activities of Daily Living ?Home Assistive Devices/Equipment: None ?ADL Screening (condition at time of admission) ?Patient's cognitive ability adequate to safely complete daily activities?: Yes ?Is the patient deaf or have difficulty hearing?: No ?Does the patient have difficulty  seeing, even when wearing glasses/contacts?: No ?Does the patient have difficulty concentrating, remembering, or making decisions?: No ?Patient able to express need for assistance with ADLs?: Yes ?Does the patient have difficulty dressing or bathing?: Yes ?Independently performs ADLs?: Yes (appropriate for developmental age) ?Does the patient have difficulty walking or climbing stairs?: Yes ?Weakness of Legs: Both ?Weakness of Arms/Hands: Both ? ?Permission Sought/Granted ?Permission sought to share information with : Family Supports ?Permission granted to share information with : Yes, Verbal Permission Granted ? Share Information with NAME: Lawana Hartzell ?   ? Permission granted to share info w Relationship: son ? Permission granted to share info w Contact Information: [phone] ? ?Emotional Assessment ?Appearance:: Appears stated age ?Attitude/Demeanor/Rapport: Engaged ?Affect (typically observed): Appropriate ?Orientation: : Oriented to Self, Oriented to Place, Oriented to  Time, Oriented to Situation ?Alcohol / Substance Use: Not Applicable ?Psych Involvement: No (comment) ? ?Admission diagnosis:  Shortness of breath [R06.02] ?AKI (acute kidney injury) ([place]) [N17.9] ?Patient Active Problem List  ? Diagnosis Date Noted  ? Acute encephalopathy 03/31/2021  ? Hypoglycemia 03/31/2021  ? Hypotension 03/31/2021  ? Elevated troponin 03/31/2021  ? Acute on chronic anemia 03/31/2021  ? Chronic diastolic CHF (congestive heart failure) ([place])   ? AKI (acute kidney injury) ([place]) 03/30/2021  ? Leg wound, left 02/01/2021  ? SIRS (systemic inflammatory response syndrome) ([place]) 05/03/2020  ? DM2 (diabetes mellitus, type 2) ([place]) 05/03/2020  ? Chronic respiratory failure  with hypoxia and hypercapnia ([place]) 05/03/2020  ? Cervical disc prolapse with radiculopathy 12/07/2019  ? History of pheochromocytoma 12/07/2019  ? 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 OSA on CPAP 12/07/2019  ? Primary osteoarthritis involving multiple joints  06/20/2019  ? Gait abnormality 06/19/2019  ? Psoriasis with arthropathy ([place]) 04/06/2017  ? TIA (transient ischemic attack) 03/22/2017  ? Acute on chronic diastolic CHF (congestive heart failure) ([place]) 03/09/2017  ? Diabetic hypoglycemia ([place]) 03/05/2014  ? Diabetic neuropathic arthropathy ([place]) 03/05/2014  ? Dizzy 12/28/2011  ? CAD (coronary artery disease), native coronary artery 12/05/2010  ? Morbid obesity ([place]) 12/05/2010  ? Essential hypertension 12/03/2010  ? Hyperlipidemia 12/03/2010  ? UI (urinary incontinence) 12/03/2010  ? Chest pain 05/31/2006  ? ?PCP:  [name], NP ?Pharmacy:   ?[facility] [place] [facility] ?[place] [address] ?Phone: [phone] Fax: [phone] ? ? ? ? ?Social Determinants of Health (SDOH) Interventions ?  ? ?Readmission Risk Interventions ?No flowsheet data found. ? ? ?

## 2021-04-01 NOTE — Progress Notes (Signed)
? [name]  ZOX:[id number] DOB: Apr 14, 1945 DOA: 03/30/2021 ?PCP: [name], NP   ? ?Brief Narrative:  ?76 year old with a history of chronic hypoxic respiratory failure on 6 L nasal cannula baseline, chronic diastolic CHF, chronic venous stasis, DM2, HTN, HLD, and chronic anemia who presented to the ED with altered mental status and was found to be in acute kidney injury.  This is all felt to have been related to polypharmacy. ? ?Consultants:  ?None ? ?Code Status: NO CODE BLUE ? ?DVT prophylaxis: ?Subcu heparin ? ?Interim Hx: ?Afebrile.  Mild tachycardia up to 105.  Blood pressure stable.  Stable on baseline 5-6 L nasal cannula oxygen with saturations at 98%.  Creatinine improving.  Electrolytes stable otherwise.  Complains of uncontrolled pain "everywhere" which she describes as her baseline but now worsened in the absence of narcotic pain medication.  Denies new complaints.  Says she is willing to [facility] to [facility] ? ?Assessment & Plan: ? ?Acute metabolic encephalopathy -polypharmacy ?Minimizing use of narcotics - attempting to control lower extremity paresthesias with Lyrica  ? ?Acute kidney failure  ?Due to prerenal state with poor intake due to above -baseline range appears to be 0.9-1.3 -steadily improving with volume support and increased intake ? ?Recent Labs  ?Lab 03/30/21 ?2049 03/31/21 ?8119 04/01/21 ?0515  ?CREATININE 3.53* 3.25* 1.93*  ? ?Uncontrolled DM2 with hypoglycemia ?CBG presently reasonably controlled -continue to monitor ? ?HTN ?Blood pressure presently controlled ? ?Troponin elevation ?No history suggestive of acute coronary syndrome -likely secondary to hypotension ? ?Anemia of chronic disease ?No evidence of acute blood loss ? ?HLD ?Continue usual statin dose ? ?Chronic diastolic CHF ?Diuretic on hold due to AKF ? ?Chronic venous stasis bilateral lower extremities ? ?MRSA screen + ? ? ?Family Communication: No family present at time of exam ?Disposition: From [facility] SNF  -return to SNF hopefully in 24 hours ? ?Objective: ?Blood pressure 101/62, pulse 99, temperature 98.7 ?F (37.1 ?C), temperature source Oral, resp. rate 16, height '5\' 2"'$  (1.575 m), weight (!) 136.1 kg, SpO2 98 %. ? ?Intake/Output Summary (Last 24 hours) at 04/01/2021 0838 ?Last data filed at 04/01/2021 0413 ?Gross per 24 hour  ?Intake 600 ml  ?Output 702 ml  ?Net -102 ml  ? ?Filed Weights  ? 03/31/21 0626 04/01/21 0635  ?Weight: (!) 155.8 kg (!) 136.1 kg  ? ? ?Examination: ?General: No acute respiratory distress ?Lungs: Clear to auscultation bilaterally without wheezes or crackles ?Cardiovascular: Regular rate and rhythm without murmur gallop or rub normal S1 and S2 ?Abdomen: Nontender, nondistended, soft, bowel sounds positive, no rebound, no ascites, no appreciable mass -obese ?Extremities: 2+ edema bilateral lower extremity ? ?CBC: ?Recent Labs  ?Lab 03/30/21 ?2049 03/30/21 ?2302 03/31/21 ?1478 04/01/21 ?0515  ?WBC 8.5  --  10.6* 8.0  ?NEUTROABS 7.3  --  8.8*  --   ?HGB 8.0* 8.5* 8.0* 8.4*  ?HCT 26.2* 25.0* 26.4* 27.2*  ?MCV 91.0  --  91.3 90.4  ?PLT 200  --  216 221  ? ?Basic Metabolic Panel: ?Recent Labs  ?Lab 03/30/21 ?2049 03/30/21 ?2250 03/30/21 ?2302 03/31/21 ?2956 04/01/21 ?0515  ?NA 136  --  134* 136 137  ?K 4.8  --  4.7 4.7 4.6  ?CL 97*  --   --  98 98  ?CO2 26  --   --  25 30  ?GLUCOSE 99  --   --  56* 168*  ?BUN 95*  --   --  94* 81*  ?CREATININE 3.53*  --   --  3.25* 1.93*  ?CALCIUM 8.7*  --   --  8.7* 9.1  ?MG  --  1.9  --  1.9  --   ? ?GFR: ?Estimated Creatinine Clearance: 34.6 mL/min (A) (by C-G formula based on SCr of 1.93 mg/dL (H)). ? ?Liver Function Tests: ?Recent Labs  ?Lab 03/30/21 ?2049 03/31/21 ?1308  ?AST 16 17  ?ALT 16 17  ?ALKPHOS 44 38  ?BILITOT 0.5 0.3  ?PROT 6.8 6.8  ?ALBUMIN 3.2* 3.2*  ? ? ?Coagulation Profile: ?Recent Labs  ?Lab 03/31/21 ?6578  ?INR 1.2  ? ? ?Cardiac Enzymes: ?Recent Labs  ?Lab 03/31/21 ?4696  ?CKTOTAL 217  ? ? ?HbA1C: ?Hgb A1c MFr Bld  ?Date/Time Value Ref Range  Status  ?02/01/2021 03:25 AM 7.2 (H) 4.8 - 5.6 % Final  ?  Comment:  ?  (NOTE) ?Pre diabetes:          5.7%-6.4% ? ?Diabetes:              >6.4% ? ?Glycemic control for   <7.0% ?adults with diabetes ?  ?05/03/2020 03:58 AM 7.7 (H) 4.8 - 5.6 % Final  ?  Comment:  ?  (NOTE) ?        Prediabetes: 5.7 - 6.4 ?        Diabetes: >6.4 ?        Glycemic control for adults with diabetes: <7.0 ?  ? ? ?CBG: ?Recent Labs  ?Lab 03/31/21 ?1102 03/31/21 ?1639 03/31/21 ?2120 03/31/21 ?2345 04/01/21 ?2952  ?GLUCAP 103* 135* 221* 196* 160*  ? ? ?Scheduled Meds: ? aspirin EC  81 mg Oral Daily  ? carvedilol  25 mg Oral BID WC  ? diclofenac Sodium  2-4 g Topical QID  ? [START ON 04/06/2021] ferrous sulfate  325 mg Oral Q Mon  ? fluticasone furoate-vilanterol  1 puff Inhalation Daily  ? heparin injection (subcutaneous)  5,000 Units Subcutaneous Q8H  ? insulin aspart  0-6 Units Subcutaneous TID WC  ? mouth rinse  15 mL Mouth Rinse BID  ? pregabalin  25 mg Oral BID  ? rosuvastatin  20 mg Oral QHS  ? umeclidinium bromide  1 puff Inhalation Daily  ? ? ? LOS: 2 days  ? ?Cherene Altes, MD ?[facility] ?Office  [phone] ?Pager - Text Page per Shea Evans ? ?If 7PM-7AM, please contact night-coverage per Amion ?04/01/2021, 8:38 AM ? ? ? ?

## 2021-04-02 ENCOUNTER — Institutional Professional Consult (permissible substitution): Payer: Medicare Other | Admitting: Pulmonary Disease

## 2021-04-02 LAB — GLUCOSE, CAPILLARY
Glucose-Capillary: 191 mg/dL — ABNORMAL HIGH (ref 70–99)
Glucose-Capillary: 244 mg/dL — ABNORMAL HIGH (ref 70–99)

## 2021-04-02 LAB — BASIC METABOLIC PANEL
Anion gap: 11 (ref 5–15)
BUN: 72 mg/dL — ABNORMAL HIGH (ref 8–23)
CO2: 27 mmol/L (ref 22–32)
Calcium: 9.1 mg/dL (ref 8.9–10.3)
Chloride: 98 mmol/L (ref 98–111)
Creatinine, Ser: 1.48 mg/dL — ABNORMAL HIGH (ref 0.44–1.00)
GFR, Estimated: 37 mL/min — ABNORMAL LOW (ref >60)
Glucose, Bld: 221 mg/dL — ABNORMAL HIGH (ref 70–99)
Potassium: 4.6 mmol/L (ref 3.5–5.1)
Sodium: 136 mmol/L (ref 135–145)

## 2021-04-02 LAB — METHYLMALONIC ACID, SERUM: Methylmalonic Acid, Quantitative: 549 nmol/L — ABNORMAL HIGH (ref 0–378)

## 2021-04-02 MED ORDER — PREGABALIN 25 MG PO CAPS: 25.0000 mg | ORAL_CAPSULE | Freq: Two times a day (BID) | ORAL | 1 refills | Status: AC

## 2021-04-02 MED ORDER — CHLORHEXIDINE GLUCONATE CLOTH 2 % EX PADS
6.0000 | MEDICATED_PAD | Freq: Every day | CUTANEOUS | Status: DC
  Administered 2021-04-02: 6 via TOPICAL

## 2021-04-02 MED ORDER — MUPIROCIN 2 % EX OINT
1.0000 "application " | TOPICAL_OINTMENT | Freq: Two times a day (BID) | CUTANEOUS | Status: DC
  Administered 2021-04-02: 1 via NASAL
  Filled 2021-04-02: qty 22

## 2021-04-02 NOTE — TOC Transition Note (Signed)
Transition of Care (TOC) - CM/SW Discharge Note ? ? ?Patient Details  ?Name: [name] ?MRN: [id number] ?Date of Birth: July 18, 1945 ? ?Transition of Care (TOC) CM/SW Contact:  ?Emeterio Reeve, LCSW ?Phone Number: ?04/02/2021, 1:05 PM ? ? ?Clinical Narrative:    ? ?Per MD patient ready for DC to [facility]. RN, patient, patient's family, and facility notified of DC. Discharge Summary and FL2 sent to facility. DC packet on chart. I Ambulance transport requested for patient.  ?  ?RN to call report to [phone]. Pt will go to room 606.  ? ?CSW will sign off for now as social work intervention is no longer needed. Please consult [place] again if new needs arise. ? ? ?Final next level of care: [place] ?Barriers to Discharge: Barriers Resolved ? ? ?Patient Goals and CMS Choice ?Patient states their goals for this hospitalization and ongoing recovery are:: Pt and family state their goal is for pt to regain some independent. ?CMS Medicare.gov Compare Post Acute Care list provided to:: Patient ?Choice offered to / list presented to : Patient, Adult Children ? ?Discharge Placement ?  ?           ?Patient chooses bed at: [facility] ?Patient to be transferred to facility by: [facility] ?Name of family member notified: son, Jerline Pain ?Patient and family notified of of transfer: 04/02/21 ? ?Discharge Plan and Services ?  ?  ?Post Acute Care Choice: [place]          ?  ?  ?  ?  ?  ?  ?  ?  ?  ?  ? ?Social Determinants of Health (SDOH) Interventions ?  ? ? ?Readmission Risk Interventions ?No flowsheet data found. ? ? ?Emeterio Reeve, LCSW ?Clinical Social Worker ? ? ?

## 2021-04-02 NOTE — Care Management Important Message (Signed)
Important Message ? ?Patient Details  ?Name: [name] ?MRN: [id number] ?Date of Birth: January 02, 1946 ? ? ?Medicare Important Message Given:  Yes ? ? ? ? ?[name] ?04/02/2021, 2:45 PM ?

## 2021-04-02 NOTE — NC FL2 (Signed)
?[place] MEDICAID FL2 LEVEL OF CARE SCREENING TOOL  ?  ? ?IDENTIFICATION  ?Patient Name: ?[name] Birthdate: April 17, 1945 Sex: female Admission Date (Current Location): ?03/30/2021  ?[place] and [place] Number: ? [place] ?  Facility and Address:  ?[facility] [place], [place] [address], [place], [place] [address] ?     Provider Number: ?[id number]  ?Attending Physician Name and Address:  ?Cherene Altes, MD ? Relative Name and Phone Number:  ?  ?   ?Current Level of Care: ?Hospital Recommended Level of Care: ?[place] Prior Approval Number: ?  ? ?Date Approved/Denied: ?  PASRR Number: ?  ? ?Discharge Plan: ?SNF ?  ? ?Current Diagnoses: ?Patient Active Problem List  ? Diagnosis Date Noted  ? Acute encephalopathy 03/31/2021  ? Hypoglycemia 03/31/2021  ? Hypotension 03/31/2021  ? Elevated troponin 03/31/2021  ? Acute on chronic anemia 03/31/2021  ? Chronic diastolic CHF (congestive heart failure) ([place])   ? AKI (acute kidney injury) ([place]) 03/30/2021  ? Leg wound, left 02/01/2021  ? SIRS (systemic inflammatory response syndrome) ([place]) 05/03/2020  ? DM2 (diabetes mellitus, type 2) ([place]) 05/03/2020  ? Chronic respiratory failure with hypoxia and hypercapnia ([place]) 05/03/2020  ? Cervical disc prolapse with radiculopathy 12/07/2019  ? History of pheochromocytoma 12/07/2019  ? 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 OSA on CPAP 12/07/2019  ? 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 Gait abnormality 06/19/2019  ? Psoriasis with arthropathy ([place]) 04/06/2017  ? TIA (transient ischemic attack) 03/22/2017  ? Acute on chronic diastolic CHF (congestive heart failure) ([place]) 03/09/2017  ? Diabetic hypoglycemia ([place]) 03/05/2014  ? Diabetic neuropathic arthropathy ([place]) 03/05/2014  ? Dizzy 12/28/2011  ? CAD (coronary artery disease), native coronary artery 12/05/2010  ? Morbid obesity ([place]) 12/05/2010  ? Essential hypertension 12/03/2010  ? Hyperlipidemia 12/03/2010  ? UI  (urinary incontinence) 12/03/2010  ? Chest pain 05/31/2006  ? ? ?Orientation RESPIRATION BLADDER Height & Weight   ?  ?Self, Time, Situation, Place ? O2 (5L) Continent, External catheter Weight: (!) 338 lb 6.5 oz (153.5 kg) ?Height:  '5\' 2"'$  (157.5 cm)  ?BEHAVIORAL SYMPTOMS/MOOD NEUROLOGICAL BOWEL NUTRITION STATUS  ?    Continent Diet  ?AMBULATORY STATUS COMMUNICATION OF NEEDS Skin   ?Limited Assist   Normal ?  ?  ?  ?    ?     ?     ? ? ?Personal Care Assistance Level of Assistance  ?Bathing, Feeding, Dressing Bathing Assistance: Limited assistance ?Feeding assistance: Limited assistance ?Dressing Assistance: Limited assistance ?   ? ?Functional Limitations Info  ?    ?  ?   ? ? ?SPECIAL CARE FACTORS FREQUENCY  ?PT (By licensed PT), OT (By licensed OT)   ?  ?PT Frequency: 5x a week ?OT Frequency: 5x a week ?  ?  ?  ?   ? ? ?Contractures Contractures Info: Not present  ? ? ?Additional Factors Info  ?Code Status, Allergies Code Status Info: DNR ?Allergies Info: Metaxalone   Nitroglycerin   Tramadol-acetaminophen   Icosapent Ethyl   Brompheniramine-pseudoeph   Meprobamate   Metformin   Methyldopa   Metoprolol   Procaine   Pseudoephedrine Hcl   Tramadol ?  ?  ?  ?   ? ?Current Medications (04/02/2021):  This is the current hospital active medication list ?Current Facility-Administered Medications  ?Medication Dose Route Frequency Provider Last Rate Last Admin  ? acetaminophen (TYLENOL) tablet 650 mg  650 mg Oral Q6H PRN Cherene Altes, MD  650 mg at 04/02/21 0335  ? aspirin EC tablet 81 mg  81 mg Oral Daily Howerter, Justin B, DO   81 mg at 04/02/21 1002  ? butalbital-acetaminophen-caffeine (FIORICET) 50-325-40 MG per tablet 1 tablet  1 tablet Oral Q4H PRN Cherene Altes, MD      ? carvedilol (COREG) tablet 25 mg  25 mg Oral BID WC Little Ishikawa, MD   25 mg at 04/02/21 2637  ? Chlorhexidine Gluconate Cloth 2 % PADS 6 each  6 each Topical Q0600 Cherene Altes, MD   6 each at 04/02/21 [id number]  ? dextrose 50  % solution 50 mL  1 ampule Intravenous Q1H PRN Howerter, Justin B, DO   50 mL at 03/31/21 0702  ? diclofenac Sodium (VOLTAREN) 1 % topical gel 2-4 g  2-4 g Topical QID Little Ishikawa, MD   2 g at 04/02/21 1225  ? [START ON 04/06/2021] ferrous sulfate tablet 325 mg  325 mg Oral Q Mon Lancaster, William C, MD      ? fluticasone furoate-vilanterol (BREO ELLIPTA) 200-25 MCG/ACT 1 puff  1 puff Inhalation Daily Howerter, Justin B, DO   1 puff at 04/01/21 5027  ? heparin injection 5,000 Units  5,000 Units Subcutaneous Q8H Merrell, Rettinger, RPH   5,000 Units at 04/02/21 7412  ? HYDROcodone-acetaminophen (NORCO/VICODIN) 5-325 MG per tablet 1 tablet  1 tablet Oral Q6H PRN Cherene Altes, MD   1 tablet at 04/02/21 1222  ? insulin aspart (novoLOG) injection 0-6 Units  0-6 Units Subcutaneous TID WC Howerter, Justin B, DO   2 Units at 04/02/21 1233  ? MEDLINE mouth rinse  15 mL Mouth Rinse BID Little Ishikawa, MD   15 mL at 04/02/21 1233  ? mupirocin ointment (BACTROBAN) 2 % 1 application.  1 application. Nasal BID Cherene Altes, MD   1 application. at 04/02/21 1232  ? naloxone [facility]) injection 0.4 mg  0.4 mg Intravenous PRN Howerter, Justin B, DO      ? pregabalin (LYRICA) capsule 25 mg  25 mg Oral BID Little Ishikawa, MD   25 mg at 04/02/21 1002  ? rOPINIRole (REQUIP) tablet 0.25 mg  0.25 mg Oral Daily PRN Little Ishikawa, MD      ? rosuvastatin (CRESTOR) tablet 20 mg  20 mg Oral QHS Howerter, Justin B, DO   20 mg at 04/01/21 2204  ? umeclidinium bromide (INCRUSE ELLIPTA) 62.5 MCG/ACT 1 puff  1 puff Inhalation Daily Howerter, Justin B, DO   1 puff at 04/01/21 0753  ? ? ? ?Discharge Medications: ?Please see discharge summary for a list of discharge medications. ? ?Relevant Imaging Results: ? ?Relevant Lab Results: ? ? ?Additional Information ?SSN [id number] ? ?Emeterio Reeve, LCSW ? ? ? ? ?

## 2021-04-02 NOTE — Discharge Summary (Addendum)
?DISCHARGE SUMMARY ? ?[name] ? ?MR#: [id number] ? ?DOB:01-23-1945  ?Date of Admission: 03/30/2021 ?Date of Discharge: 04/02/2021 ? ?Attending Physician:Carrie Sermon Hennie Duos, MD ? ?Patient's KGM:WNUUVOZ, [name] ? ?Consults: none  ? ?Disposition: D/C to SNF for rehab stay  ? ? ?Follow-up Appts: ? Follow-up Information   ? ? [name], [name] Follow up in 1 week(s).   ?Contact information: ?[address] ?[place] [address] ?[phone] ? ? ?  ?  ? ?  ?  ? ?  ? ? ?Tests Needing Follow-up: ?-pt appears to be quite susceptible to sedation from multiple medications, therefore it is strongly advised that upward titration of narcotics or other sedating meds be avoided entirely, or very carefully monitored if felt to be absolutely necessary ?-f/u BMET in 3-5 days to assure renal function has continued to improve  ?-monitor CBG w/ probable need to further adjust DM tx ?-resume diuretic dosing if intake consistently improved ? ?Discharge Diagnoses: ?Acute metabolic encephalopathy ?Polypharmacy ?Acute kidney failure  ?Uncontrolled DM2 with hypoglycemia ?HTN ?Troponin elevation ?Anemia of chronic disease ?HLD ?Chronic diastolic CHF ?Chronic venous stasis bilateral lower extremities ?Morbid obesity - Body mass index is 61.9 kg/m?. ? ? ?Initial presentation: ?76 year old with a history of chronic hypoxic respiratory failure on 6 L nasal cannula baseline, chronic diastolic CHF, chronic venous stasis, DM2, HTN, HLD, and chronic anemia who presented to the ED with altered mental status and was found to be in acute kidney injury.  This was all ultimately felt to have been related to polypharmacy. ? ?Hospital Course: ? ?Acute metabolic encephalopathy -polypharmacy ?Minimizied use of narcotics and with this her mental status rapidly returned to normal - attempting to control lower extremity paresthesias with Lyrica - if neuropathic pain persists long term consideration could be given to very carefully increasing Lyrica   ?  ?Acute kidney failure  ?Due to prerenal state with poor intake due to polypharmacy induced sedation - baseline range appears to be 0.9-1.3 -steadily improving with volume support and increased intake and now rapidly approaching her baseline with clear trend of improvement ?  ?Uncontrolled DM2 with hypoglycemia ?CBG presently reasonably controlled - continue to monitor in rehab facility - ongoing titration of medical therapy likely to be required  ?  ?HTN ?Blood pressure well controlled at time of d/c  ?  ?Troponin elevation ?No history suggestive of acute coronary syndrome - likely secondary to hypotension - denies chest pain or DOE ?  ?Anemia of chronic disease ?No evidence of acute blood loss - Hgb stable during this admit  ?  ?HLD ?Continue usual statin dose ?  ?Chronic diastolic CHF ?Diuretic on held during admission due to AKF - suggest resuming in a stepwise fashion at rehab facility as intake improves  ?  ?Chronic venous stasis bilateral lower extremities ? ?Morbid obesity - Body mass index is 61.9 kg/m?. ? ?  ?Allergies as of 04/02/2021   ? ?   Reactions  ? Metaxalone Anaphylaxis  ? Nitroglycerin   ? BP bottoms out  ? Tramadol-acetaminophen Anaphylaxis  ? Icosapent Ethyl Other (See Comments)  ? Stomach pain, gas, diarrhea, headache from Vascepa  ? Brompheniramine-pseudoeph   ? Feels drunk  ? Meprobamate   ? Metformin Nausea Only  ? "flushes"  ? Methyldopa   ? Other reaction(s): Unknown  ? Metoprolol   ? Other reaction(s): Unknown  ? Procaine   ? Passes out  ? Pseudoephedrine Hcl   ? "feel drunk"  ? Tramadol   ? "  Made me go wonky, got fluid in my lungs"  ? ?  ? ?  ?Medication List  ?  ? ?STOP taking these medications   ? ?amLODipine 5 MG tablet ?Commonly known as: NORVASC ?  ?gabapentin 300 MG capsule ?Commonly known as: NEURONTIN ?  ?lisinopril 10 MG tablet ?Commonly known as: ZESTRIL ?  ?methocarbamol 500 MG tablet ?Commonly known as: Robaxin ?  ?metolazone 2.5 MG tablet ?Commonly known as: ZAROXOLYN ?   ?Ranolazine ER 500 MG Pack ?  ? ?  ? ?TAKE these medications   ? ?acetaminophen 500 MG tablet ?Commonly known as: TYLENOL ?Take 500 mg by mouth every 8 (eight) hours as needed for moderate pain or headache. ?  ?albuterol 108 (90 Base) MCG/ACT inhaler ?Commonly known as: VENTOLIN HFA ?Inhale 2 puffs into the lungs 4 (four) times daily as needed for wheezing or shortness of breath. ?  ?aspirin EC 81 MG tablet ?Take 81 mg by mouth daily. Swallow whole. ?  ?Breo Ellipta 200-25 MCG/ACT Aepb ?Generic drug: fluticasone furoate-vilanterol ?Inhale 1 puff into the lungs daily. ?  ?carvedilol 25 MG tablet ?Commonly known as: COREG ?Take 25 mg by mouth 2 (two) times daily with a meal. ?  ?cetirizine 10 MG tablet ?Commonly known as: ZYRTEC ?Take 10 mg by mouth daily. ?  ?diclofenac Sodium 1 % Gel ?Commonly known as: VOLTAREN ?Apply 2-4 g topically in the morning, at noon, and at bedtime. ?  ?ferrous sulfate 325 (65 FE) MG tablet ?Take 1 tablet (325 mg total) by mouth daily with breakfast. ?  ?fluticasone 50 MCG/ACT nasal spray ?Commonly known as: FLONASE ?Place 1 spray into both nostrils daily as needed for allergies or rhinitis. ?  ?glucose blood test strip ?as directed. Dispense Blood Glucose Test Strips to patient for home use. Testing 4 times daily. ?  ?HYDROcodone-acetaminophen 5-325 MG tablet ?Commonly known as: NORCO/VICODIN ?Take 1 tablet by mouth every 6 (six) hours as needed for moderate pain. ?  ?insulin NPH-regular Human (70-30) 100 UNIT/ML injection ?Inject 73 Units into the skin with breakfast, with lunch, and with evening meal. Sliding scale 60-73 units ?  ?INSULIN SYRINGE 1CC/31GX5/16" 31G X 5/16" 1 ML Misc ?USE TO INJECT INSULIN TWO TIMES A DAY ?  ?ipratropium-albuterol 0.5-2.5 (3) MG/3ML Soln ?Commonly known as: DUONEB ?Take 3 mLs by nebulization every 6 (six) hours as needed (wheezing). ?  ?Ixekizumab 80 MG/ML Soaj ?Inject into the skin. Give subcutaneous every 4 weeks ?  ?Lancets 33G Misc ?as directed.  Testing 4 times daily.  Dispense Lancets to patient for home use. ?  ?Lidocaine 4 % Ptch ?Apply 1 application. topically daily. Left shoulder ?  ?melatonin 5 MG Tabs ?Take 1 tablet (5 mg total) by mouth at bedtime. ?  ?MULTIVITAMIN ADULT PO ?Take 1 tablet by mouth daily. ?  ?OVER THE COUNTER MEDICATION ?Apply 1 application. topically daily. Zinc oxide external paste 40%  ?Apply to groin area ?  ?pregabalin 25 MG capsule ?Commonly known as: LYRICA ?Take 1 capsule (25 mg total) by mouth 2 (two) times daily. ?  ?Respiratory Therapy Supplies Devi ?as directed. BIPAP 18/14 cm H2O with humidifier, mask, tubing, and headgear. ?  ?rOPINIRole 0.25 MG tablet ?Commonly known as: REQUIP ?Take 0.25 mg by mouth daily as needed (RLS). ?  ?rosuvastatin 20 MG tablet ?Commonly known as: CRESTOR ?Take 20 mg by mouth at bedtime. ?  ?senna-docusate 8.6-50 MG tablet ?Commonly known as: Senokot-S ?Take 1 tablet by mouth 2 (two) times daily. ?  ?Spiriva HandiHaler  18 MCG inhalation capsule ?Generic drug: tiotropium ?Place 18 mcg into inhaler and inhale daily. ?  ?Torsemide 60 MG Tabs ?Take 60 mg by mouth daily. ?  ?vitamin B-12 1000 MCG tablet ?Commonly known as: CYANOCOBALAMIN ?Take 1,000 mcg by mouth daily. ?  ?Vitamin D3 125 MCG (5000 UT) Caps ?Take 5,000 Units by mouth. ?  ? ?  ? ? ?Day of Discharge ?BP (!) 96/50   Pulse 98   Temp 98.6 ?F (37 ?C) (Oral)   Resp 19   Ht '5\' 2"'$  (1.575 m)   Wt (!) 153.5 kg   SpO2 92%   BMI 61.90 kg/m?  ? ?Physical Exam: ?General: No acute respiratory distress ?Lungs: Clear to auscultation bilaterally without wheezes or crackles ?Cardiovascular: Regular rate and rhythm without murmur gallop or rub normal S1 and S2 ?Abdomen: Nontender, nondistended, soft, bowel sounds positive, no rebound, no ascites, no appreciable mass ?Extremities: 2+ stable B LE edema - see chronic wounds documented below:  ? ?Wound / Incision (Open or Dehisced) 02/01/21 Tibial Left;Posterior Open (Active)  ?Date First  Assessed/Time First Assessed: 02/01/21 1604   Location: Tibial  Location Orientation: Left;Posterior  Wound Description (Comments): (c) Open  Present on Admission: Yes  ?  ?Assessments 02/01/2021  7:58 PM 04/01/2021  7:50 PM

## 2021-04-02 NOTE — Progress Notes (Signed)
Occupational Therapy Treatment ?Patient Details ?Name: [name] ?MRN: [id number] ?DOB: 21-May-1945 ?Today's Date: 04/02/2021 ? ? ?History of present illness 76 y/o female who presents on 03/30/21 from SNF with AMS, SOB and abnormal labs.  Found to have AKI and hypotension. PMH includes Covid 19 in 2020 with resipiratory failure, HF, DM, HTN, OSA on CPAP, morbod obesity, chronic peripheral edema, chronic venous stasis wound in LLE. ?  ?OT comments ? Upon arrival, pt sitting in recliner with BLE lowered and reporting she just return to recliner from EOB. Focused session on challenging activity tolerance, UE ROM, and occupational participation. Pt eager to perform grooming at sink. Pt completing grooming (including washing her face, oral care, shaving, applying lotion) with set up and supervision. Pt having received medication at beginning of session and noting pt becoming more lethargic and with decreased awareness as session progressed. Pt reporting at end of session that she is starting to feel depressed as her life has changed so much. Also reporting seeing  a "winking eye" on the wall. Notified RN pf these changes. Continue to recommend dc to SNF and will continue to follow acutely as admitted.   ? ?Recommendations for follow up therapy are one component of a multi-disciplinary discharge planning process, led by the attending physician.  Recommendations may be updated based on patient status, additional functional criteria and insurance authorization. ?   ?Follow Up Recommendations ? Skilled nursing-short term rehab (<3 hours/day)  ?  ?Assistance Recommended at Discharge Frequent or constant Supervision/Assistance  ?Patient can return home with the following ? A little help with walking and/or transfers;A little help with bathing/dressing/bathroom;A lot of help with bathing/dressing/bathroom ?  ?Equipment Recommendations ? None recommended by OT  ?  ?Recommendations for Other Services PT consult ? ?  ?Precautions  / Restrictions Precautions ?Precautions: Fall;Other (comment) ?Precaution Comments: LLE wound, 02, watch BP ?Restrictions ?Weight Bearing Restrictions: No  ? ? ?  ? ?Mobility Bed Mobility ?  ?  ?  ?  ?  ?  ?  ?General bed mobility comments: In recliner upon arrival ?  ? ?Transfers ?  ?  ?  ?  ?  ?  ?  ?  ?  ?  ?  ?  ?Balance Overall balance assessment: Needs assistance ?Sitting-balance support: No upper extremity supported, Feet supported ?Sitting balance-Leahy Scale: Fair ?  ?  ?  ?  ?  ?  ?  ?  ?  ?  ?  ?  ?  ?  ?  ?  ?   ? ?ADL either performed or assessed with clinical judgement  ? ?ADL Overall ADL's : Needs assistance/impaired ?  ?  ?Grooming: Wash/dry face;Oral care;Set up;Supervision/safety;Sitting (Shaving) ?Grooming Details (indicate cue type and reason): Pt completing groomign tasks while sitting with set up and supervision. ?  ?  ?  ?  ?  ?  ?  ?  ?  ?  ?  ?  ?  ?  ?  ?General ADL Comments: Due to pain and fatigue from recent transfer to/from [facility], pt agreeable to sitting at sink and performing grooming. ?  ? ?Extremity/Trunk Assessment Upper Extremity Assessment ?Upper Extremity Assessment: Generalized weakness (edema) ?  ?Lower Extremity Assessment ?Lower Extremity Assessment: Defer to PT evaluation ?  ?  ?  ? ?Vision   ?  ?  ?Perception   ?  ?Praxis   ?  ? ?Cognition Arousal/Alertness: Awake/alert, Lethargic (Slightly lethargic - taken several pain medications) ?Behavior During Therapy: [facility] for  tasks assessed/performed ?Overall Cognitive Status: Within Functional Limits for tasks assessed ?  ?  ?  ?  ?  ?  ?  ?  ?  ?  ?  ?  ?  ?  ?  ?  ?  ?  ?  ?   ?Exercises Exercises: General Lower Extremity ?General Exercises - Lower Extremity ?Long Arc Quad: AROM, Both, 5 reps, Seated ? ?  ?Shoulder Instructions   ? ? ?  ?General Comments SpO2 97% on 4L  ? ? ?Pertinent Vitals/ Pain       Pain Assessment ?Pain Assessment: 0-10 ?Faces Pain Scale: Hurts whole lot ?Pain Location: LLE ?Pain Descriptors / Indicators:  Grimacing, Guarding, Constant, Burning, Shooting ?Pain Intervention(s): Monitored during session, Limited activity within patient's tolerance, Repositioned ? ?Home Living Family/patient expects to be discharged to:: Skilled nursing facility ?  ?  ?  ?  ?  ?  ?  ?  ?  ?  ?  ?  ?  ?  ?  ?  ?Additional Comments: [facility] - recently at rehab side ?  ? ?  ?Prior Functioning/Environment    ?  ?  ?  ?   ? ?Frequency ? Min 2X/week  ? ? ? ? ?  ?Progress Toward Goals ? ?OT Goals(current goals can now be found in the care plan section) ? Progress towards OT goals: Progressing toward goals ? ?Acute Rehab OT Goals ?OT Goal Formulation: With patient ?Time For Goal Achievement: 04/14/21 ?Potential to Achieve Goals: Good ?ADL Goals ?Pt Will Perform Lower Body Dressing: with min assist;with adaptive equipment;sit to/from stand ?Pt Will Transfer to Toilet: with min guard assist;bedside commode;ambulating ?Pt Will Perform Toileting - Clothing Manipulation and hygiene: with min guard assist;with adaptive equipment;sit to/from stand;sitting/lateral leans ?Pt/caregiver will Perform Home Exercise Program: Increased ROM;Increased strength;Both right and left upper extremity;With theraband;With written HEP provided;With Supervision  ?Plan Discharge plan remains appropriate   ? ?Co-evaluation ? ? ?   ?  ?  ?  ?  ? ?  ?AM-PAC OT "6 Clicks" Daily Activity     ?Outcome Measure ? ? Help from another person eating meals?: None ?Help from another person taking care of personal grooming?: None ?Help from another person toileting, which includes using toliet, bedpan, or urinal?: A Lot ?Help from another person bathing (including washing, rinsing, drying)?: A Little ?Help from another person to put on and taking off regular upper body clothing?: A Little ?Help from another person to put on and taking off regular lower body clothing?: A Lot ?6 Click Score: 18 ? ?  ?End of Session   ? ?OT Visit Diagnosis: Unsteadiness on feet (R26.81);Other  abnormalities of gait and mobility (R26.89);Muscle weakness (generalized) (M62.81) ?  ?Activity Tolerance Patient tolerated treatment well;Patient limited by fatigue;Patient limited by lethargy ?  ?Patient Left in chair;with call bell/phone within reach ?  ?Nurse Communication Mobility status ?  ? ?   ? ?Time: 7026-3785 ?OT Time Calculation (min): 53 min ? ?Charges: OT General Charges ?$OT Visit: 1 Visit ?OT Treatments ?$Self Care/Home Management : 53-67 mins ? ?Marytza Grandpre MSOT, OTR/L ?Acute Rehab ?Pager: [phone] ?Office: [phone] ? ?Tagen Brethauer M Emmaus Brandi ?04/02/2021, 1:24 PM ? ? ?

## 2021-04-02 NOTE — Progress Notes (Deleted)
? ? ?Subjective:  ? ?PATIENT ID: [name] GENDER: female DOB: 09-Sep-1945, MRN: [id number] ? ? ?HPI ? ?No chief complaint on file. ? ? ?Reason for Visit: New consult for COPD ? ?[name] is a 76 year old female with morbid obesity, COPD, chronic hypoxemic respiratory failure OSA on CPAP chronic diastolic heart failure, hypertension, diabetes history TIA, psoriasis who presents as a new consult for COPD.   ? ?She is currently on Breo and albuterol ? ?She lives in assisted living center.  She was recently hospitalized from 1/14 to 02/05/2021 for chronic diastolic heart failure and respiratory failure requiring diuresis. ? ?She was admitted on 03/30/2021 for altered mental status and AKI ?*** ?Social History: ? ?Environmental exposures: *** ? ?I have personally reviewed patient's past medical/family/social history, allergies, current medications.*** ? ?Past Medical History:  ?Diagnosis Date  ? Coronary artery disease   ? Diabetes mellitus without complication ([place])   ? Hyperlipidemia   ? Hypertension   ? OSA (obstructive sleep apnea)   ?  ? ?Family History  ?Problem Relation Age of Onset  ? Heart attack Mother   ? Stroke Mother   ? Heart attack Father   ?  ? ?Social History  ? ?Occupational History  ? Occupation: retired  ?Tobacco Use  ? Smoking status: Former  ?  Packs/day: 2.00  ?  Years: 10.00  ?  Pack years: 20.00  ?  Types: Cigarettes  ?  Quit date: 08/15/1989  ?  Years since quitting: 31.6  ? Smokeless tobacco: Never  ?Substance and Sexual Activity  ? Alcohol use: Not Currently  ? Drug use: Not Currently  ? Sexual activity: Not on file  ? ? ?Allergies  ?Allergen Reactions  ? Metaxalone Anaphylaxis  ? Nitroglycerin   ?  BP bottoms out  ? Tramadol-Acetaminophen Anaphylaxis  ? Icosapent Ethyl Other (See Comments)  ?  Stomach pain, gas, diarrhea, headache from Vascepa  ? Brompheniramine-Pseudoeph   ?  Feels drunk  ? Meprobamate   ? Metformin Nausea Only  ?  "flushes"  ? Methyldopa   ?  Other reaction(s):  Unknown  ? Metoprolol   ?  Other reaction(s): Unknown  ? Procaine   ?  Passes out  ? Pseudoephedrine Hcl   ?  "feel drunk"  ? Tramadol   ?  "Made me go wonky, got fluid in my lungs"  ?  ? ?Facility-Administered Medications Prior to Visit  ?Medication Dose Route Frequency Provider Last Rate Last Admin  ? acetaminophen (TYLENOL) tablet 650 mg  650 mg Oral Q6H PRN Cherene Altes, MD   650 mg at 04/02/21 0335  ? aspirin EC tablet 81 mg  81 mg Oral Daily Howerter, Justin B, DO   81 mg at 04/01/21 3154  ? butalbital-acetaminophen-caffeine (FIORICET) 50-325-40 MG per tablet 1 tablet  1 tablet Oral Q4H PRN Cherene Altes, MD      ? carvedilol (COREG) tablet 25 mg  25 mg Oral BID WC Little Ishikawa, MD   25 mg at 04/02/21 0086  ? Chlorhexidine Gluconate Cloth 2 % PADS 6 each  6 each Topical Q0600 Cherene Altes, MD   6 each at 04/02/21 [phone]  ? dextrose 50 % solution 50 mL  1 ampule Intravenous Q1H PRN Howerter, Justin B, DO   50 mL at 03/31/21 0702  ? diclofenac Sodium (VOLTAREN) 1 % topical gel 2-4 g  2-4 g Topical QID Little Ishikawa, MD   2 g at 04/01/21 2205  ? [  START ON 04/06/2021] ferrous sulfate tablet 325 mg  325 mg Oral Q Mon Lancaster, William C, MD      ? fluticasone furoate-vilanterol (BREO ELLIPTA) 200-25 MCG/ACT 1 puff  1 puff Inhalation Daily Howerter, Justin B, DO   1 puff at 04/01/21 0263  ? heparin injection 5,000 Units  5,000 Units Subcutaneous Q8H Aanya, Haynes, RPH   5,000 Units at 04/02/21 7858  ? HYDROcodone-acetaminophen (NORCO/VICODIN) 5-325 MG per tablet 1 tablet  1 tablet Oral Q6H PRN Cherene Altes, MD   1 tablet at 04/02/21 [phone]  ? insulin aspart (novoLOG) injection 0-6 Units  0-6 Units Subcutaneous TID WC Howerter, Justin B, DO   1 Units at 04/02/21 7741  ? MEDLINE mouth rinse  15 mL Mouth Rinse BID Little Ishikawa, MD   15 mL at 04/01/21 2205  ? mupirocin ointment (BACTROBAN) 2 % 1 application.  1 application. Nasal BID Cherene Altes, MD      ? naloxone  [facility]) injection 0.4 mg  0.4 mg Intravenous PRN Howerter, Justin B, DO      ? pregabalin (LYRICA) capsule 25 mg  25 mg Oral BID Little Ishikawa, MD   25 mg at 04/01/21 1449  ? rOPINIRole (REQUIP) tablet 0.25 mg  0.25 mg Oral Daily PRN Little Ishikawa, MD      ? rosuvastatin (CRESTOR) tablet 20 mg  20 mg Oral QHS Howerter, Justin B, DO   20 mg at 04/01/21 2204  ? umeclidinium bromide (INCRUSE ELLIPTA) 62.5 MCG/ACT 1 puff  1 puff Inhalation Daily Howerter, Justin B, DO   1 puff at 04/01/21 0753  ? ?Outpatient Medications Prior to Visit  ?Medication Sig Dispense Refill  ? acetaminophen (TYLENOL) 500 MG tablet Take 500 mg by mouth every 8 (eight) hours as needed for moderate pain or headache.    ? albuterol (VENTOLIN HFA) 108 (90 Base) MCG/ACT inhaler Inhale 2 puffs into the lungs 4 (four) times daily as needed for wheezing or shortness of breath.    ? amLODipine (NORVASC) 5 MG tablet Take 5 mg by mouth daily.    ? aspirin EC 81 MG tablet Take 81 mg by mouth daily. Swallow whole.    ? BREO ELLIPTA 200-25 MCG/INH AEPB Inhale 1 puff into the lungs daily.    ? carvedilol (COREG) 25 MG tablet Take 25 mg by mouth 2 (two) times daily with a meal.    ? cephALEXin (KEFLEX) 500 MG capsule Take 1 capsule (500 mg total) by mouth 3 (three) times daily. (Patient not taking: Reported on 03/31/2021) 30 capsule 0  ? cetirizine (ZYRTEC) 10 MG tablet Take 10 mg by mouth daily.    ? Cholecalciferol (VITAMIN D3) 125 MCG (5000 UT) CAPS Take 5,000 Units by mouth.    ? diclofenac Sodium (VOLTAREN) 1 % GEL Apply 2-4 g topically in the morning, at noon, and at bedtime.    ? ferrous sulfate 325 (65 FE) MG tablet Take 1 tablet (325 mg total) by mouth daily with breakfast.  3  ? fluticasone (FLONASE) 50 MCG/ACT nasal spray Place 1 spray into both nostrils daily as needed for allergies or rhinitis.    ? gabapentin (NEURONTIN) 300 MG capsule Take 300 mg by mouth at bedtime.    ? glucose 4 GM chewable tablet Chew 3 tablets by mouth as  needed for low blood sugar. (Patient not taking: Reported on 03/31/2021)    ? glucose blood test strip as directed. Dispense Blood Glucose Test Strips to  patient for home use. Testing 4 times daily.    ? HYDROcodone-acetaminophen (NORCO/VICODIN) 5-325 MG tablet Take 1 tablet by mouth every 6 (six) hours as needed for moderate pain. 20 tablet 0  ? insulin NPH-regular Human (70-30) 100 UNIT/ML injection Inject 73 Units into the skin with breakfast, with lunch, and with evening meal. Sliding scale 60-73 units    ? Insulin Syringe-Needle U-100 (INSULIN SYRINGE 1CC/31GX5/16") 31G X 5/16" 1 ML MISC USE TO INJECT INSULIN TWO TIMES A DAY    ? ipratropium-albuterol (DUONEB) 0.5-2.5 (3) MG/3ML SOLN Take 3 mLs by nebulization every 6 (six) hours as needed (wheezing).    ? Ixekizumab 80 MG/ML SOAJ Inject into the skin. Give subcutaneous every 4 weeks    ? Lancets 33G MISC as directed. Testing 4 times daily.  Dispense Lancets to patient for home use.    ? Lidocaine 4 % PTCH Apply 1 application. topically daily. Left shoulder    ? lisinopril (ZESTRIL) 10 MG tablet Take 10 mg by mouth daily.    ? melatonin 5 MG TABS Take 1 tablet (5 mg total) by mouth at bedtime. 30 tablet 0  ? methocarbamol (ROBAXIN) 500 MG tablet Take 1 tablet (500 mg total) by mouth every 6 (six) hours as needed for muscle spasms.    ? metolazone (ZAROXOLYN) 2.5 MG tablet Take 2.5 mg by mouth every other day.    ? Multiple Vitamin (MULTIVITAMIN ADULT PO) Take 1 tablet by mouth daily.    ? ondansetron (ZOFRAN) 4 MG tablet Take 1 tablet (4 mg total) by mouth every 8 (eight) hours as needed for nausea or vomiting. (Patient not taking: Reported on 03/10/2021) 20 tablet 0  ? OVER THE COUNTER MEDICATION Apply 1 application. topically daily. Zinc oxide external paste 40%  ?Apply to groin area    ? polyethylene glycol (MIRALAX / GLYCOLAX) 17 g packet Take 17 g by mouth daily as needed for mild constipation. (Patient not taking: Reported on 03/31/2021)    ? Ranolazine ER  500 MG PACK Take 500 mg by mouth 2 (two) times daily.    ? Respiratory Therapy Supplies DEVI as directed. BIPAP 18/14 cm H2O with humidifier, mask, tubing, and headgear.    ? rOPINIRole (REQUIP) 0.25 MG tablet Ta

## 2021-04-06 ENCOUNTER — Inpatient Hospital Stay (HOSPITAL_COMMUNITY)
Admission: EM | Admit: 2021-04-06 | Discharge: 2021-04-17 | DRG: 314 | Disposition: A | Discharge status: Skilled Nursing Facility | Payer: Medicare Other | Source: Skilled Nursing Facility | Attending: Internal Medicine | Admitting: Internal Medicine

## 2021-04-06 ENCOUNTER — Emergency Department (HOSPITAL_COMMUNITY): Payer: Medicare Other

## 2021-04-06 ENCOUNTER — Other Ambulatory Visit: Payer: Self-pay

## 2021-04-06 ENCOUNTER — Encounter (HOSPITAL_BASED_OUTPATIENT_CLINIC_OR_DEPARTMENT_OTHER): Payer: Medicare Other | Admitting: General Surgery

## 2021-04-06 DIAGNOSIS — I130 Hypertensive heart and chronic kidney disease with heart failure and stage 1 through stage 4 chronic kidney disease, or unspecified chronic kidney disease: Secondary | ICD-10-CM | POA: Diagnosis present

## 2021-04-06 DIAGNOSIS — E1159 Type 2 diabetes mellitus with other circulatory complications: Secondary | ICD-10-CM

## 2021-04-06 DIAGNOSIS — J449 Chronic obstructive pulmonary disease, unspecified: Secondary | ICD-10-CM | POA: Diagnosis present

## 2021-04-06 DIAGNOSIS — E1122 Type 2 diabetes mellitus with diabetic chronic kidney disease: Secondary | ICD-10-CM | POA: Diagnosis present

## 2021-04-06 DIAGNOSIS — N390 Urinary tract infection, site not specified: Secondary | ICD-10-CM | POA: Diagnosis present

## 2021-04-06 DIAGNOSIS — D631 Anemia in chronic kidney disease: Secondary | ICD-10-CM | POA: Diagnosis present

## 2021-04-06 DIAGNOSIS — N1831 Chronic kidney disease, stage 3a: Secondary | ICD-10-CM | POA: Diagnosis present

## 2021-04-06 DIAGNOSIS — G894 Chronic pain syndrome: Secondary | ICD-10-CM | POA: Diagnosis not present

## 2021-04-06 DIAGNOSIS — Z8249 Family history of ischemic heart disease and other diseases of the circulatory system: Secondary | ICD-10-CM

## 2021-04-06 DIAGNOSIS — N179 Acute kidney failure, unspecified: Secondary | ICD-10-CM | POA: Diagnosis not present

## 2021-04-06 DIAGNOSIS — I4891 Unspecified atrial fibrillation: Secondary | ICD-10-CM | POA: Diagnosis present

## 2021-04-06 DIAGNOSIS — Z7982 Long term (current) use of aspirin: Secondary | ICD-10-CM

## 2021-04-06 DIAGNOSIS — E869 Volume depletion, unspecified: Secondary | ICD-10-CM | POA: Diagnosis present

## 2021-04-06 DIAGNOSIS — E1165 Type 2 diabetes mellitus with hyperglycemia: Secondary | ICD-10-CM | POA: Diagnosis present

## 2021-04-06 DIAGNOSIS — I10 Essential (primary) hypertension: Secondary | ICD-10-CM | POA: Diagnosis present

## 2021-04-06 DIAGNOSIS — I482 Chronic atrial fibrillation, unspecified: Secondary | ICD-10-CM | POA: Diagnosis present

## 2021-04-06 DIAGNOSIS — Z79899 Other long term (current) drug therapy: Secondary | ICD-10-CM

## 2021-04-06 DIAGNOSIS — S81809A Unspecified open wound, unspecified lower leg, initial encounter: Secondary | ICD-10-CM | POA: Diagnosis present

## 2021-04-06 DIAGNOSIS — E896 Postprocedural adrenocortical (-medullary) hypofunction: Secondary | ICD-10-CM | POA: Diagnosis present

## 2021-04-06 DIAGNOSIS — Z888 Allergy status to other drugs, medicaments and biological substances status: Secondary | ICD-10-CM

## 2021-04-06 DIAGNOSIS — J9611 Chronic respiratory failure with hypoxia: Secondary | ICD-10-CM | POA: Diagnosis present

## 2021-04-06 DIAGNOSIS — I959 Hypotension, unspecified: Principal | ICD-10-CM | POA: Diagnosis present

## 2021-04-06 DIAGNOSIS — Z8616 Personal history of COVID-19: Secondary | ICD-10-CM

## 2021-04-06 DIAGNOSIS — E662 Morbid (severe) obesity with alveolar hypoventilation: Secondary | ICD-10-CM | POA: Diagnosis present

## 2021-04-06 DIAGNOSIS — L97919 Non-pressure chronic ulcer of unspecified part of right lower leg with unspecified severity: Secondary | ICD-10-CM | POA: Diagnosis present

## 2021-04-06 DIAGNOSIS — I878 Other specified disorders of veins: Secondary | ICD-10-CM | POA: Diagnosis present

## 2021-04-06 DIAGNOSIS — Z7901 Long term (current) use of anticoagulants: Secondary | ICD-10-CM

## 2021-04-06 DIAGNOSIS — E119 Type 2 diabetes mellitus without complications: Secondary | ICD-10-CM

## 2021-04-06 DIAGNOSIS — I248 Other forms of acute ischemic heart disease: Secondary | ICD-10-CM | POA: Diagnosis present

## 2021-04-06 DIAGNOSIS — I2510 Atherosclerotic heart disease of native coronary artery without angina pectoris: Secondary | ICD-10-CM | POA: Diagnosis present

## 2021-04-06 DIAGNOSIS — Z794 Long term (current) use of insulin: Secondary | ICD-10-CM

## 2021-04-06 DIAGNOSIS — Z6841 Body Mass Index (BMI) 40.0 and over, adult: Secondary | ICD-10-CM

## 2021-04-06 DIAGNOSIS — E785 Hyperlipidemia, unspecified: Secondary | ICD-10-CM | POA: Diagnosis present

## 2021-04-06 DIAGNOSIS — Z66 Do not resuscitate: Secondary | ICD-10-CM | POA: Diagnosis present

## 2021-04-06 DIAGNOSIS — G4733 Obstructive sleep apnea (adult) (pediatric): Secondary | ICD-10-CM

## 2021-04-06 DIAGNOSIS — Z9989 Dependence on other enabling machines and devices: Secondary | ICD-10-CM

## 2021-04-06 DIAGNOSIS — I5033 Acute on chronic diastolic (congestive) heart failure: Secondary | ICD-10-CM | POA: Diagnosis present

## 2021-04-06 DIAGNOSIS — Z20822 Contact with and (suspected) exposure to covid-19: Secondary | ICD-10-CM | POA: Diagnosis present

## 2021-04-06 DIAGNOSIS — L97929 Non-pressure chronic ulcer of unspecified part of left lower leg with unspecified severity: Secondary | ICD-10-CM | POA: Diagnosis present

## 2021-04-06 DIAGNOSIS — I480 Paroxysmal atrial fibrillation: Secondary | ICD-10-CM | POA: Diagnosis present

## 2021-04-06 DIAGNOSIS — Z87891 Personal history of nicotine dependence: Secondary | ICD-10-CM

## 2021-04-06 LAB — I-STAT CHEM 8, ED
BUN: 49 mg/dL — ABNORMAL HIGH (ref 8–23)
Calcium, Ion: 1.02 mmol/L — ABNORMAL LOW (ref 1.15–1.40)
Chloride: 100 mmol/L (ref 98–111)
Creatinine, Ser: 1.9 mg/dL — ABNORMAL HIGH (ref 0.44–1.00)
Glucose, Bld: 133 mg/dL — ABNORMAL HIGH (ref 70–99)
HCT: 25 % — ABNORMAL LOW (ref 36.0–46.0)
Hemoglobin: 8.5 g/dL — ABNORMAL LOW (ref 12.0–15.0)
Potassium: 4 mmol/L (ref 3.5–5.1)
Sodium: 140 mmol/L (ref 135–145)
TCO2: 32 mmol/L (ref 22–32)

## 2021-04-06 LAB — CBC WITH DIFFERENTIAL/PLATELET
Abs Immature Granulocytes: 0.06 10*3/uL (ref 0.00–0.07)
Basophils Absolute: 0 10*3/uL (ref 0.0–0.1)
Basophils Relative: 0 %
Eosinophils Absolute: 0.1 10*3/uL (ref 0.0–0.5)
Eosinophils Relative: 1 %
HCT: 27.6 % — ABNORMAL LOW (ref 36.0–46.0)
Hemoglobin: 7.9 g/dL — ABNORMAL LOW (ref 12.0–15.0)
Immature Granulocytes: 1 %
Lymphocytes Relative: 7 %
Lymphs Abs: 0.7 10*3/uL (ref 0.7–4.0)
MCH: 27 pg (ref 26.0–34.0)
MCHC: 28.6 g/dL — ABNORMAL LOW (ref 30.0–36.0)
MCV: 94.2 fL (ref 80.0–100.0)
Monocytes Absolute: 0.9 10*3/uL (ref 0.1–1.0)
Monocytes Relative: 8 %
Neutro Abs: 8.9 10*3/uL — ABNORMAL HIGH (ref 1.7–7.7)
Neutrophils Relative %: 83 %
Platelets: 249 10*3/uL (ref 150–400)
RBC: 2.93 MIL/uL — ABNORMAL LOW (ref 3.87–5.11)
RDW: 17.7 % — ABNORMAL HIGH (ref 11.5–15.5)
WBC: 10.7 10*3/uL — ABNORMAL HIGH (ref 4.0–10.5)
nRBC: 0 % (ref 0.0–0.2)

## 2021-04-06 LAB — COMPREHENSIVE METABOLIC PANEL
ALT: 20 U/L (ref 0–44)
AST: 19 U/L (ref 15–41)
Albumin: 3 g/dL — ABNORMAL LOW (ref 3.5–5.0)
Alkaline Phosphatase: 37 U/L — ABNORMAL LOW (ref 38–126)
Anion gap: 14 (ref 5–15)
BUN: 55 mg/dL — ABNORMAL HIGH (ref 8–23)
CO2: 29 mmol/L (ref 22–32)
Calcium: 8.7 mg/dL — ABNORMAL LOW (ref 8.9–10.3)
Chloride: 96 mmol/L — ABNORMAL LOW (ref 98–111)
Creatinine, Ser: 1.9 mg/dL — ABNORMAL HIGH (ref 0.44–1.00)
GFR, Estimated: 27 mL/min — ABNORMAL LOW (ref >60)
Glucose, Bld: 132 mg/dL — ABNORMAL HIGH (ref 70–99)
Potassium: 4.1 mmol/L (ref 3.5–5.1)
Sodium: 139 mmol/L (ref 135–145)
Total Bilirubin: 0.4 mg/dL (ref 0.3–1.2)
Total Protein: 6.5 g/dL (ref 6.5–8.1)

## 2021-04-06 LAB — RESP PANEL BY RT-PCR (FLU A&B, COVID) ARPGX2
Influenza A by PCR: NEGATIVE
Influenza B by PCR: NEGATIVE
SARS Coronavirus 2 by RT PCR: NEGATIVE

## 2021-04-06 LAB — BRAIN NATRIURETIC PEPTIDE: B Natriuretic Peptide: 180.2 pg/mL — ABNORMAL HIGH (ref 0.0–100.0)

## 2021-04-06 LAB — TROPONIN I (HIGH SENSITIVITY): Troponin I (High Sensitivity): 41 ng/L — ABNORMAL HIGH (ref <18)

## 2021-04-06 LAB — PROTIME-INR
INR: 1.3 — ABNORMAL HIGH (ref 0.8–1.2)
Prothrombin Time: 16 seconds — ABNORMAL HIGH (ref 11.4–15.2)

## 2021-04-06 LAB — LACTIC ACID, PLASMA: Lactic Acid, Venous: 1.8 mmol/L (ref 0.5–1.9)

## 2021-04-06 MED ORDER — HYDROCODONE-ACETAMINOPHEN 5-325 MG PO TABS
1.0000 | ORAL_TABLET | Freq: Four times a day (QID) | ORAL | Status: DC | PRN
  Administered 2021-04-07 – 2021-04-10 (×11): 1 via ORAL
  Filled 2021-04-06 (×11): qty 1

## 2021-04-06 MED ORDER — FERROUS SULFATE 325 (65 FE) MG PO TABS
325.0000 mg | ORAL_TABLET | Freq: Every day | ORAL | Status: DC
  Administered 2021-04-09: 325 mg via ORAL
  Filled 2021-04-06 (×7): qty 1

## 2021-04-06 MED ORDER — ENOXAPARIN SODIUM 40 MG/0.4ML IJ SOSY: 40.0000 mg | PREFILLED_SYRINGE | INTRAMUSCULAR | Status: DC

## 2021-04-06 MED ORDER — SODIUM CHLORIDE 0.9 % IV BOLUS
500.0000 mL | Freq: Once | INTRAVENOUS | Status: AC
  Administered 2021-04-06: 500 mL via INTRAVENOUS

## 2021-04-06 MED ORDER — VANCOMYCIN HCL 1750 MG/350ML IV SOLN
1750.0000 mg | INTRAVENOUS | Status: DC
Start: 2021-04-08 — End: 2021-04-07

## 2021-04-06 MED ORDER — ACETAMINOPHEN 325 MG PO TABS: 650.0000 mg | ORAL_TABLET | Freq: Four times a day (QID) | ORAL | Status: DC | PRN

## 2021-04-06 MED ORDER — LIDOCAINE 5 % EX PTCH
1.0000 | MEDICATED_PATCH | Freq: Every day | CUTANEOUS | Status: DC
  Administered 2021-04-07 – 2021-04-09 (×3): 1 via TRANSDERMAL
  Filled 2021-04-06 (×7): qty 1

## 2021-04-06 MED ORDER — ACETAMINOPHEN 650 MG RE SUPP: 650.0000 mg | Freq: Four times a day (QID) | RECTAL | Status: DC | PRN

## 2021-04-06 MED ORDER — ROSUVASTATIN CALCIUM 20 MG PO TABS
20.0000 mg | ORAL_TABLET | Freq: Every day | ORAL | Status: DC
Start: 2021-04-06 — End: 2021-04-17
  Administered 2021-04-07 – 2021-04-16 (×11): 20 mg via ORAL
  Filled 2021-04-06 (×11): qty 1

## 2021-04-06 MED ORDER — INSULIN ASPART 100 UNIT/ML IJ SOLN
0.0000 [IU] | Freq: Three times a day (TID) | INTRAMUSCULAR | Status: DC
  Administered 2021-04-07 (×3): 3 [IU] via SUBCUTANEOUS
  Administered 2021-04-08: 4 [IU] via SUBCUTANEOUS
  Administered 2021-04-08 (×2): 7 [IU] via SUBCUTANEOUS
  Administered 2021-04-09: 11 [IU] via SUBCUTANEOUS
  Administered 2021-04-09: 7 [IU] via SUBCUTANEOUS
  Administered 2021-04-09 – 2021-04-10 (×4): 4 [IU] via SUBCUTANEOUS
  Administered 2021-04-11 (×2): 7 [IU] via SUBCUTANEOUS
  Administered 2021-04-11 – 2021-04-12 (×2): 4 [IU] via SUBCUTANEOUS
  Administered 2021-04-12: 11 [IU] via SUBCUTANEOUS
  Administered 2021-04-12: 4 [IU] via SUBCUTANEOUS
  Administered 2021-04-13: 11 [IU] via SUBCUTANEOUS
  Administered 2021-04-13: 4 [IU] via SUBCUTANEOUS
  Administered 2021-04-13: 7 [IU] via SUBCUTANEOUS
  Administered 2021-04-14: 4 [IU] via SUBCUTANEOUS
  Administered 2021-04-14 (×2): 11 [IU] via SUBCUTANEOUS
  Administered 2021-04-15 (×2): 7 [IU] via SUBCUTANEOUS
  Administered 2021-04-15 – 2021-04-16 (×4): 11 [IU] via SUBCUTANEOUS
  Administered 2021-04-17: 7 [IU] via SUBCUTANEOUS

## 2021-04-06 MED ORDER — ASPIRIN EC 81 MG PO TBEC
81.0000 mg | DELAYED_RELEASE_TABLET | Freq: Every day | ORAL | Status: DC
Start: 2021-04-07 — End: 2021-04-08
  Administered 2021-04-07 – 2021-04-08 (×2): 81 mg via ORAL
  Filled 2021-04-06 (×2): qty 1

## 2021-04-06 MED ORDER — ALBUTEROL SULFATE (2.5 MG/3ML) 0.083% IN NEBU: 3.0000 mL | INHALATION_SOLUTION | Freq: Four times a day (QID) | RESPIRATORY_TRACT | Status: DC | PRN

## 2021-04-06 MED ORDER — FLUTICASONE FUROATE-VILANTEROL 200-25 MCG/ACT IN AEPB
1.0000 | INHALATION_SPRAY | Freq: Every day | RESPIRATORY_TRACT | Status: DC
  Administered 2021-04-08 – 2021-04-17 (×7): 1 via RESPIRATORY_TRACT
  Filled 2021-04-06 (×3): qty 28

## 2021-04-06 MED ORDER — SENNOSIDES-DOCUSATE SODIUM 8.6-50 MG PO TABS
1.0000 | ORAL_TABLET | Freq: Two times a day (BID) | ORAL | Status: DC
  Administered 2021-04-07 – 2021-04-14 (×17): 1 via ORAL
  Filled 2021-04-06 (×18): qty 1

## 2021-04-06 MED ORDER — TIOTROPIUM BROMIDE MONOHYDRATE 18 MCG IN CAPS
18.0000 ug | ORAL_CAPSULE | Freq: Every day | RESPIRATORY_TRACT | Status: DC
  Filled 2021-04-06: qty 5

## 2021-04-06 MED ORDER — SODIUM CHLORIDE 0.9 % IV BOLUS
1000.0000 mL | Freq: Once | INTRAVENOUS | Status: AC
  Administered 2021-04-06: 1000 mL via INTRAVENOUS

## 2021-04-06 MED ORDER — ONDANSETRON HCL 4 MG/2ML IJ SOLN: 4.0000 mg | Freq: Four times a day (QID) | INTRAMUSCULAR | Status: DC | PRN

## 2021-04-06 MED ORDER — SODIUM CHLORIDE 0.9 % IV SOLN
2.0000 g | Freq: Two times a day (BID) | INTRAVENOUS | Status: DC
  Administered 2021-04-06: 2 g via INTRAVENOUS
  Filled 2021-04-06: qty 2

## 2021-04-06 MED ORDER — INSULIN ASPART 100 UNIT/ML IJ SOLN
0.0000 [IU] | Freq: Every day | INTRAMUSCULAR | Status: DC
  Administered 2021-04-07 – 2021-04-12 (×6): 2 [IU] via SUBCUTANEOUS
  Administered 2021-04-13 – 2021-04-14 (×2): 3 [IU] via SUBCUTANEOUS
  Administered 2021-04-15: 4 [IU] via SUBCUTANEOUS
  Administered 2021-04-16: 2 [IU] via SUBCUTANEOUS

## 2021-04-06 MED ORDER — ONDANSETRON HCL 4 MG PO TABS: 4.0000 mg | ORAL_TABLET | Freq: Four times a day (QID) | ORAL | Status: DC | PRN

## 2021-04-06 MED ORDER — FENTANYL CITRATE PF 50 MCG/ML IJ SOSY
25.0000 ug | PREFILLED_SYRINGE | Freq: Once | INTRAMUSCULAR | Status: AC
  Administered 2021-04-06: 25 ug via INTRAVENOUS
  Filled 2021-04-06: qty 1

## 2021-04-06 MED ORDER — PREGABALIN 25 MG PO CAPS: 25.0000 mg | ORAL_CAPSULE | Freq: Two times a day (BID) | ORAL | Status: DC

## 2021-04-06 MED ORDER — SODIUM CHLORIDE 0.9 % IV SOLN
2500.0000 mg | Freq: Once | INTRAVENOUS | Status: AC
Start: 2021-04-06 — End: 2021-04-07
  Administered 2021-04-06: 2500 mg via INTRAVENOUS
  Filled 2021-04-06: qty 2500

## 2021-04-06 NOTE — Progress Notes (Signed)
[place], [place] ([id number]) ?Visit Report for 04/06/2021 ?Allergy List Details ?Patient Name: Date of Service: ?Carrie Everts, MA RTHA 04/06/2021 1:15 PM ?Medical Record Number: [id number] ?Patient Account Number: [id number] ?Date of Birth/Sex: Treating RN: ?19-Apr-1945 (76 y.o. [name], [name] ?Primary Care Sela Falk: Gilford Silvius, [name] Other Clinician: ?Referring Ezella Kell: ?Treating Najwa Spillane/Extender: Fredirick Maudlin ?McDonald, Adam ?Weeks in Treatment: 0 ?Allergies ?Active Allergies ?metaxalone ?Reaction: anaphylaxis ?Severity: Severe ?nitroglycerin ?tramadol ?Reaction: anaphylaxis ?Severity: Severe ?icosapent ethyl ?Reaction: GI upset ?Severity: Moderate ?brompheniramine ?Reaction: "feels drunk" ?meprobamate ?metformin ?Reaction: gets flushed ?methyldopa ?metoprolol ?procaine ?Reaction: passes out ?Severity: Severe ?pseudoephedrine ?Reaction: "feels drunk" ?Allergy Notes ?Electronic Signature(s) ?Signed: 04/06/2021 6:09:44 PM By: Levan Hurst RN, BSN ?Entered By: Levan Hurst on 04/06/2021 14:44:46 ?-------------------------------------------------------------------------------- ?Arrival Information Details ?Patient Name: Date of Service: ?Carrie Everts, MA RTHA 04/06/2021 1:15 PM ?Medical Record Number: [id number] ?Patient Account Number: [id number] ?Date of Birth/Sex: Treating RN: ?1945-02-14 (76 y.o. [name], [name] ?Primary Care Theodoro Koval: Gilford Silvius, [name] Other Clinician: ?Referring Dashay Giesler: ?Treating Ihan Pat/Extender: Fredirick Maudlin ?McDonald, Adam ?Weeks in Treatment: 0 ?Visit Information ?Patient Arrived: Wheel Chair ?Arrival Time: 13:49 ?Accompanied By: caregiver ?Transfer Assistance: None ?Patient Identification Verified: Yes ?Secondary Verification Process Completed: Yes ?Patient Requires Transmission-Based Precautions: No ?Patient Has Alerts: Yes ?Patient Alerts: ABIs non obtainable(pain) ?Electronic Signature(s) ?Signed: 04/06/2021 6:09:44 PM By: Levan Hurst RN, BSN ?Entered By: Levan Hurst on 04/06/2021  14:40:31 ?-------------------------------------------------------------------------------- ?Clinic Level of Care Assessment Details ?Patient Name: Date of Service: ?Carrie Everts, MA RTHA 04/06/2021 1:15 PM ?Medical Record Number: [id number] ?Patient Account Number: [id number] ?Date of Birth/Sex: Treating RN: ?08/31/1945 (76 y.o. [name], [name] ?Primary Care Taj Arteaga: Gilford Silvius, [name] Other Clinician: ?Referring Maximina Pirozzi: ?Treating Shaquanna Lycan/Extender: Fredirick Maudlin ?McDonald, Adam ?Weeks in Treatment: 0 ?Clinic Level of Care Assessment Items ?TOOL 2 Quantity Score ?X- 1 0 ?Use when only an EandM is performed on the INITIAL visit ?ASSESSMENTS - Nursing Assessment / Reassessment ?X- 1 20 ?General Physical Exam (combine w/ comprehensive assessment (listed just below) when performed on new pt. evals) ?X- 1 25 ?Comprehensive Assessment (HX, ROS, Risk Assessments, Wounds Hx, etc.) ?ASSESSMENTS - Wound and Skin A ssessment / Reassessment ?'[]'$  - 0 ?Simple Wound Assessment / Reassessment - one wound ?X- 3 5 ?Complex Wound Assessment / Reassessment - multiple wounds ?'[]'$  - 0 ?Dermatologic / Skin Assessment (not related to wound area) ?ASSESSMENTS - Ostomy and/or Continence Assessment and Care ?'[]'$  - 0 ?Incontinence Assessment and Management ?'[]'$  - 0 ?Ostomy Care Assessment and Management (repouching, etc.) ?PROCESS - Coordination of Care ?X - Simple Patient / Family Education for ongoing care 1 15 ?'[]'$  - 0 ?Complex (extensive) Patient / Family Education for ongoing care ?X- 1 10 ?Staff obtains Consents, Records, T Results / Process Orders ?est ?X- 1 10 ?Staff telephones HHA, Nursing Homes / Clarify orders / etc ?'[]'$  - 0 ?Routine Transfer to another Facility (non-emergent condition) ?'[]'$  - 0 ?Routine Hospital Admission (non-emergent condition) ?X- 1 15 ?New Admissions / Biomedical engineer / Ordering NPWT Apligraf, etc. ?, ?'[]'$  - 0 ?Emergency Hospital Admission (emergent condition) ?X- 1 10 ?Simple Discharge Coordination ?'[]'$  -  0 ?Complex (extensive) Discharge Coordination ?PROCESS - Special Needs ?'[]'$  - 0 ?Pediatric / Minor Patient Management ?'[]'$  - 0 ?Isolation Patient Management ?'[]'$  - 0 ?Hearing / Language / Visual special needs ?'[]'$  - 0 ?Assessment of Community assistance (transportation, D/C planning, etc.) ?'[]'$  - 0 ?Additional assistance / Altered mentation ?'[]'$  - 0 ?Support Surface(s) Assessment (bed, cushion, seat, etc.) ?INTERVENTIONS - Wound Cleansing / Measurement ?X- 1 5 ?Wound Imaging (photographs -  any number of wounds) ?'[]'$  - 0 ?Wound Tracing (instead of photographs) ?'[]'$  - 0 ?Simple Wound Measurement - one wound ?X- 3 5 ?Complex Wound Measurement - multiple wounds ?'[]'$  - 0 ?Simple Wound Cleansing - one wound ?X- 3 5 ?Complex Wound Cleansing - multiple wounds ?INTERVENTIONS - Wound Dressings ?'[]'$  - 0 ?Small Wound Dressing one or multiple wounds ?'[]'$  - 0 ?Medium Wound Dressing one or multiple wounds ?X- 2 20 ?Large Wound Dressing one or multiple wounds ?'[]'$  - 0 ?Application of Medications - injection ?INTERVENTIONS - Miscellaneous ?'[]'$  - 0 ?External ear exam ?'[]'$  - 0 ?Specimen Collection (cultures, biopsies, blood, body fluids, etc.) ?'[]'$  - 0 ?Specimen(s) / Culture(s) sent or taken to Lab for analysis ?'[]'$  - 0 ?Patient Transfer (multiple staff / Civil Service fast streamer / Similar devices) ?'[]'$  - 0 ?Simple Staple / Suture removal (25 or less) ?'[]'$  - 0 ?Complex Staple / Suture removal (26 or more) ?'[]'$  - 0 ?Hypo / Hyperglycemic Management (close monitor of Blood Glucose) ?'[]'$  - 0 ?Ankle / Brachial Index (ABI) - do not check if billed separately ?Has the patient been seen at the hospital within the last three years: Yes ?Total Score: 195 ?Level Of Care: New/Established - Level 5 ?Electronic Signature(s) ?Signed: 04/06/2021 6:09:44 PM By: Levan Hurst RN, BSN ?Entered By: Levan Hurst on 04/06/2021 17:31:27 ?-------------------------------------------------------------------------------- ?Encounter Discharge Information Details ?Patient Name: Date of  Service: ?Carrie Everts, MA RTHA 04/06/2021 1:15 PM ?Medical Record Number: [id number] ?Patient Account Number: [id number] ?Date of Birth/Sex: Treating RN: ?[birth year] (76 y.o. [name], [name] ?Primary Care Maryelizabeth Eberle: Gilford Silvius, [name] Other Clinician: ?Referring Adahlia Stembridge: ?Treating Deniece Rankin/Extender: Fredirick Maudlin ?McDonald, Adam ?Weeks in Treatment: 0 ?Encounter Discharge Information Items ?Discharge Condition: Stable ?Ambulatory Status: Wheelchair ?Discharge Destination: Home ?Transportation: Private Auto ?Accompanied By: alone ?Schedule Follow-up Appointment: Yes ?Clinical Summary of Care: Patient Declined ?Electronic Signature(s) ?Signed: 04/06/2021 6:09:44 PM By: Levan Hurst RN, BSN ?Entered By: Levan Hurst on 04/06/2021 17:33:49 ?-------------------------------------------------------------------------------- ?Lower Extremity Assessment Details ?Patient Name: ?Date of Service: ?Carrie Everts, MA RTHA 04/06/2021 1:15 PM ?Medical Record Number: [id number] ?Patient Account Number: [id number] ?Date of Birth/Sex: ?Treating RN: ?01-05-1946 (76 y.o. [name], [name] ?Primary Care Jacy Howat: Gilford Silvius, [name] ?Other Clinician: ?Referring Keeshawn Fakhouri: ?Treating Bayli Quesinberry/Extender: Fredirick Maudlin ?McDonald, Adam ?Weeks in Treatment: 0 ?Edema Assessment ?Assessed: [Left: No] [Right: No] ?Edema: [Left: Yes] [Right: Yes] ?Calf ?Left: Right: ?Point of Measurement: 29 cm From Medial Instep 49.4 cm 50 cm ?Ankle ?Left: Right: ?Point of Measurement: 10 cm From Medial Instep 28 cm 31.5 cm ?Vascular Assessment ?Pulses: ?Dorsalis Pedis ?Palpable: [Left:Yes] [Right:Yes] ?Electronic Signature(s) ?Signed: 04/06/2021 6:09:44 PM By: Levan Hurst RN, BSN ?Entered By: Levan Hurst on 04/06/2021 14:12:19 ?-------------------------------------------------------------------------------- ?Multi Wound Chart Details ?Patient Name: ?Date of Service: ?Carrie Everts, MA RTHA 04/06/2021 1:15 PM ?Medical Record Number: [id number] ?Patient Account Number:  [id number] ?Date of Birth/Sex: ?Treating RN: ?04-Jun-1945 (76 y.o. F) ?Primary Care Manolo Bosket: Gilford Silvius, [name] ?Other Clinician: ?Referring Yahsir Wickens: ?Treating Shasta Chinn/Extender: Fredirick Maudlin ?Lanae Crumbly ?Weeks in [place]

## 2021-04-06 NOTE — ED Triage Notes (Addendum)
Pt arrived via [facility] for cc of hypotension from [facility]. Staff called EMS for blood pressure reading of 62/42, last EMS blood pressure 96/48. 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  ? ?EMS Vitals ?HR 90-100 afib ?SPO2 98% 4 L [place] baseline ?BP 96/48 ?CBG 206 ? ?

## 2021-04-06 NOTE — Assessment & Plan Note (Addendum)
No h/o same, in A.Fib on monitor and EKG today, this is in contrast to admit last week. ?2d echo ordered ?Heparin per pharm ?Tele monitor ?Rate controlled for the moment, though may go into RVR since we are holding coreg due to hypotension. ?Add rate control med if needed later on and BP improves. ?

## 2021-04-06 NOTE — ED Provider Notes (Signed)
?[facility] ?Provider Note ? ? ?CSN: [id number] ?Arrival date & time: 04/06/21  1945 ? ?  ? ?History ? ?Chief Complaint  ?Patient presents with  ? Hypotension  ? ? ?[name] is a 76 y.o. female. ? ?76 year old female with prior medical history as detailed below presents from [facility] for evaluation of reported hypotension.  Patient complains of feeling weak.  EMS reports that pressures initially were in the 60s over 40s. ? ?Patient denies fever.  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  She uses supplemental O2 via nasal cannula at baseline. ? ?She was recently admitted and discharged on March 13. ? ?Patient is denying significant recent symptoms such as fever, cough, shortness of breath, nausea, vomiting. ? ?The history is provided by the patient and medical records.  ?Illness ?Location:  Hypotension ?Severity:  Moderate ?Onset quality:  Gradual ?Duration:  1 day ?Timing:  Unable to specify ?Progression:  Unchanged ?Chronicity:  New ? ?  ? ?Home Medications ?Prior to Admission medications   ?Medication Sig Start Date End Date Taking? Authorizing Provider  ?acetaminophen (TYLENOL) 500 MG tablet Take 500 mg by mouth every 8 (eight) hours as needed for moderate pain or headache.    [provider]  ?albuterol (VENTOLIN HFA) 108 (90 Base) MCG/ACT inhaler Inhale 2 puffs into the lungs 4 (four) times daily as needed for wheezing or shortness of breath.    [provider]  ?aspirin EC 81 MG tablet Take 81 mg by mouth daily. Swallow whole.    [provider]  ?[place] Patter 200-25 MCG/INH AEPB Inhale 1 puff into the lungs daily. 04/22/20   [provider]  ?carvedilol (COREG) 25 MG tablet Take 25 mg by mouth 2 (two) times daily with a meal.    [provider]  ?cetirizine (ZYRTEC) 10 MG tablet Take 10 mg by mouth daily.    [provider]  ?Cholecalciferol (VITAMIN D3) 125 MCG (5000 UT) CAPS Take 5,000  Units by mouth.    [provider]  ?diclofenac Sodium (VOLTAREN) 1 % GEL Apply 2-4 g topically in the morning, at noon, and at bedtime.    [provider]  ?ferrous sulfate 325 (65 FE) MG tablet Take 1 tablet (325 mg total) by mouth daily with breakfast. 02/06/21   [name], MD  ?fluticasone (FLONASE) 50 MCG/ACT nasal spray Place 1 spray into both nostrils daily as needed for allergies or rhinitis.    [provider]  ?glucose blood test strip as directed. Dispense Blood Glucose Test Strips to patient for home use. Testing 4 times daily. 01/16/19   [provider]  ?HYDROcodone-acetaminophen (NORCO/VICODIN) 5-325 MG tablet Take 1 tablet by mouth every 6 (six) hours as needed for moderate pain. 02/05/21   [name], MD  ?insulin NPH-regular Human (70-30) 100 UNIT/ML injection Inject 73 Units into the skin with breakfast, with lunch, and with evening meal. Sliding scale 60-73 units    [provider]  ?Insulin Syringe-Needle U-100 (INSULIN SYRINGE 1CC/31GX5/16") 31G X 5/16" 1 ML MISC USE TO INJECT INSULIN TWO TIMES A DAY 03/28/19   [provider]  ?ipratropium-albuterol (DUONEB) 0.5-2.5 (3) MG/3ML SOLN Take 3 mLs by nebulization every 6 (six) hours as needed (wheezing).    [provider]  ?Ixekizumab 80 MG/ML SOAJ Inject into the skin. Give subcutaneous every 4 weeks    [provider]  ?Lancets 33G MISC as directed. Testing 4 times daily.  Dispense Lancets to  patient for home use. 01/16/19   [provider]  ?Lidocaine 4 % PTCH Apply 1 application. topically daily. Left shoulder    [provider]  ?melatonin 5 MG TABS Take 1 tablet (5 mg total) by mouth at bedtime. 02/05/21   [name], MD  ?Multiple Vitamin (MULTIVITAMIN ADULT PO) Take 1 tablet by mouth daily.    [provider]  ?OVER THE COUNTER MEDICATION Apply 1 application. topically daily. Zinc oxide external paste 40%  ?Apply to groin area     [provider]  ?pregabalin (LYRICA) 25 MG capsule Take 1 capsule (25 mg total) by mouth 2 (two) times daily. 04/02/21   Cherene Altes, MD  ?Respiratory Therapy Supplies DEVI as directed. BIPAP 18/14 cm H2O with humidifier, mask, tubing, and headgear. 03/14/19   [provider]  ?rOPINIRole (REQUIP) 0.25 MG tablet Take 0.25 mg by mouth daily as needed (RLS).    [provider]  ?rosuvastatin (CRESTOR) 20 MG tablet Take 20 mg by mouth at bedtime.    [provider]  ?senna-docusate (SENOKOT-S) 8.6-50 MG tablet Take 1 tablet by mouth 2 (two) times daily. 02/05/21   [name], MD  ?tiotropium (SPIRIVA HANDIHALER) 18 MCG inhalation capsule Place 18 mcg into inhaler and inhale daily.    [provider]  ?Torsemide 60 MG TABS Take 60 mg by mouth daily.    [provider]  ?vitamin B-12 (CYANOCOBALAMIN) 1000 MCG tablet Take 1,000 mcg by mouth daily.    [provider]  ?   ? ?Allergies    ?Metaxalone, Nitroglycerin, Tramadol-acetaminophen, Icosapent ethyl, Brompheniramine-pseudoeph, Meprobamate, Metformin, Methyldopa, Metoprolol, Procaine, Pseudoephedrine hcl, and Tramadol   ? ?Review of Systems   ?Review of Systems  ?All other systems reviewed and are negative. ? ?Physical Exam ?Updated Vital Signs ?BP (!) 73/51   Pulse 65   Temp 98.3 ?F (36.8 ?C) (Oral)   Resp 15   SpO2 96%  ?Physical Exam ?Vitals and nursing note reviewed.  ?Constitutional:   ?   General: She is not in acute distress. ?   Appearance: Normal appearance. She is well-developed. She is ill-appearing.  ?   Comments: Morbidly obese, chronically ill in appearance  ?HENT:  ?   Head: Normocephalic and atraumatic.  ?Eyes:  ?   Conjunctiva/sclera: Conjunctivae normal.  ?   Pupils: Pupils are equal, round, and reactive to light.  ?Cardiovascular:  ?   Rate and Rhythm: Normal rate and regular rhythm.  ?   Heart sounds: Normal heart sounds.  ?Pulmonary:  ?   Effort: Pulmonary effort is  normal. No respiratory distress.  ?   Breath sounds: Normal breath sounds.  ?Abdominal:  ?   General: There is no distension.  ?   Palpations: Abdomen is soft.  ?   Tenderness: There is no abdominal tenderness.  ?Musculoskeletal:     ?   General: No deformity. Normal range of motion.  ?   Cervical back: Normal range of motion and neck supple.  ?Skin: ?   General: Skin is warm and dry.  ?   Comments: Distal both lower extremities are wrapped with compressive dressings.  ?Neurological:  ?   General: No focal deficit present.  ?   Mental Status: She is alert and oriented to person, place, and time.  ? ? ?ED Results / Procedures / Treatments   ?Labs ?(all labs ordered are listed, but only abnormal results are displayed) ?Labs Reviewed  ?COMPREHENSIVE METABOLIC PANEL - Abnormal; Notable for  the following components:  ?    Result Value  ? Chloride 96 (*)   ? Glucose, Bld 132 (*)   ? BUN 55 (*)   ? Creatinine, Ser 1.90 (*)   ? Calcium 8.7 (*)   ? Albumin 3.0 (*)   ? Alkaline Phosphatase 37 (*)   ? GFR, Estimated 27 (*)   ? All other components within normal limits  ?CBC WITH DIFFERENTIAL/PLATELET - Abnormal; Notable for the following components:  ? WBC 10.7 (*)   ? RBC 2.93 (*)   ? Hemoglobin 7.9 (*)   ? HCT 27.6 (*)   ? MCHC 28.6 (*)   ? RDW 17.7 (*)   ? Neutro Abs 8.9 (*)   ? All other components within normal limits  ?PROTIME-INR - Abnormal; Notable for the following components:  ? Prothrombin Time 16.0 (*)   ? INR 1.3 (*)   ? All other components within normal limits  ?BRAIN NATRIURETIC PEPTIDE - Abnormal; Notable for the following components:  ? B Natriuretic Peptide 180.2 (*)   ? All other components within normal limits  ?I-STAT CHEM 8, ED - Abnormal; Notable for the following components:  ? BUN 49 (*)   ? Creatinine, Ser 1.90 (*)   ? Glucose, Bld 133 (*)   ? Calcium, Ion 1.02 (*)   ? Hemoglobin 8.5 (*)   ? HCT 25.0 (*)   ? All other components within normal limits  ?TROPONIN I (HIGH SENSITIVITY) - Abnormal; Notable  for the following components:  ? Troponin I (High Sensitivity) 41 (*)   ? All other components within normal limits  ?RESP PANEL BY RT-PCR (FLU A&B, COVID) ARPGX2  ?CULTURE, BLOOD (ROUTINE X 2)  ?CULTURE, BLOOD

## 2021-04-06 NOTE — Assessment & Plan Note (Signed)
Cont CPAP

## 2021-04-06 NOTE — Progress Notes (Signed)
Pharmacy Antibiotic Note ? ?[name] is a 76 y.o. female admitted on 04/06/2021 with sepsis.  Pharmacy has been consulted for vancomycin and cefepime dosing. ? ?Patient with a history of chronic hypoxic respiratory failure, chronic diastolic CHF, chronic venous stasis, DM2, HTN, HLD, and chronic anemia . Patient presenting with hypotension. ? ?SCr 1.9 ?WBC 10.7; LA 1.8; T 98.3 F; HR 100; RR 15 ?COVID/Flu - neg ? ?Plan: ?Cefepime 2g q12hr ?Vancomycin 2500 mg once then 1750 mg q48hr (eAUC 531) unless change in renal function ?Trend WBC, Fever, Renal function, & Clinical course ?F/u cultures, clinical course, WBC, fever ?De-escalate when able ? ?  ? ?Temp (24hrs), Avg:98.3 ?F (36.8 ?C), Min:98.3 ?F (36.8 ?C), Max:98.3 ?F (36.8 ?C) ? ?Recent Labs  ?Lab 03/31/21 ?2353 04/01/21 ?6144 04/02/21 ?0151 04/06/21 ?2005 04/06/21 ?2027 04/06/21 ?2033  ?WBC 10.6* 8.0  --   --  10.7*  --   ?CREATININE 3.25* 1.93* 1.48*  --  1.90* 1.90*  ?LATICACIDVEN  --   --   --  1.8  --   --   ?  ?Estimated Creatinine Clearance: 37.5 mL/min (A) (by C-G formula based on SCr of 1.9 mg/dL (H)).   ? ?Allergies  ?Allergen Reactions  ? Metaxalone Anaphylaxis  ? Nitroglycerin   ?  BP bottoms out  ? Tramadol-Acetaminophen Anaphylaxis  ? Icosapent Ethyl Other (See Comments)  ?  Stomach pain, gas, diarrhea, headache from Vascepa  ? Brompheniramine-Pseudoeph   ?  Feels drunk  ? Meprobamate   ? Metformin Nausea Only  ?  "flushes"  ? Methyldopa   ?  Other reaction(s): Unknown  ? Metoprolol   ?  Other reaction(s): Unknown  ? Procaine   ?  Passes out  ? Pseudoephedrine Hcl   ?  "feel drunk"  ? Tramadol   ?  "Made me go wonky, got fluid in my lungs"  ? ? ?Antimicrobials this admission: ?cefepime 3/20 >>  ?vancomycin 3/20 >> ? ?Microbiology results: ?Pending ? ?Thank you for allowing pharmacy to be a part of this patient?s care. ? ?Lorelei Pont, PharmD, BCPS ?04/06/2021 9:31 PM ?ED Clinical Pharmacist -  [phone] ?  ?

## 2021-04-06 NOTE — Assessment & Plan Note (Addendum)
Pre renal secondary to hypotension. ?1. Hold BP meds ?2. IVF ?3. Strict intake and output ?4. Repeat BMP in AM ?

## 2021-04-06 NOTE — Assessment & Plan Note (Addendum)
Cont norco she was discharged on. ?Hold lyrica since pt doesn't think this is helping anyhow. ?Avoid escalation due to risk of AMS from polypharm (just admitted for this last week) ?

## 2021-04-06 NOTE — ED Notes (Signed)
RN and phlebotomy unable to obtain second set of blood cultures prior to antibiotic administration due to difficulty obtaining access.  ?

## 2021-04-06 NOTE — Progress Notes (Signed)
[place], [place] ([id number]) ?Visit Report for 04/06/2021 ?Abuse Risk Screen Details ?Patient Name: Date of Service: ?Carrie Everts, MA RTHA 04/06/2021 1:15 PM ?Medical Record Number: [id number] ?Patient Account Number: [id number] ?Date of Birth/Sex: Treating RN: ?1945-11-06 (76 y.o. [name], Shatara ?Primary Care Nadiya Pieratt: Gilford Silvius, [name] Other Clinician: ?Referring Kassie Keng: ?Treating Castulo Scarpelli/Extender: Fredirick Maudlin ?McDonald, Adam ?Weeks in Treatment: 0 ?Abuse Risk Screen Items ?Answer ?ABUSE RISK SCREEN: ?Has anyone close to you tried to hurt or harm you recentlyo No ?Do you feel uncomfortable with anyone in your familyo No ?Has anyone forced you do things that you didnt want to doo No ?Electronic Signature(s) ?Signed: 04/06/2021 6:09:44 PM By: Levan Hurst RN, BSN ?Entered By: Levan Hurst on 04/06/2021 14:14:05 ?-------------------------------------------------------------------------------- ?Activities of Daily Living Details ?Patient Name: Date of Service: ?Carrie Everts, MA RTHA 04/06/2021 1:15 PM ?Medical Record Number: [id number] ?Patient Account Number: [id number] ?Date of Birth/Sex: Treating RN: ?[birth year] (76 y.o. [name], Shatara ?Primary Care Demetrio Leighty: Gilford Silvius, [name] Other Clinician: ?Referring Charlsey Moragne: ?Treating Rama Mcclintock/Extender: Fredirick Maudlin ?McDonald, Adam ?Weeks in Treatment: 0 ?Activities of Daily Living Items ?Answer ?Activities of Daily Living (Please select one for each item) ?Drive Automobile Not Able ?T Medications ?ake Completely Able ?Use T elephone Completely Able ?Care for Appearance Need Assistance ?Use T oilet Need Assistance ?Bath / Shower Need Assistance ?Dress Self Need Assistance ?Feed Self Completely Able ?Walk Need Assistance ?Get In / Out Bed Need Assistance ?Housework Need Assistance ?Prepare Meals Need Assistance ?Handle Money Need Assistance ?Shop for Self Need Assistance ?Electronic Signature(s) ?Signed: 04/06/2021 6:09:44 PM By: Levan Hurst RN, BSN ?Entered By: Levan Hurst on 04/06/2021 14:19:21 ?-------------------------------------------------------------------------------- ?Education Screening Details ?Patient Name: ?Date of Service: ?Carrie Everts, MA RTHA 04/06/2021 1:15 PM ?Medical Record Number: [id number] ?Patient Account Number: [id number] ?Date of Birth/Sex: ?Treating RN: ?July 10, 1945 (76 y.o. [name], Shatara ?Primary Care Gerome Kokesh: Gilford Silvius, [name] ?Other Clinician: ?Referring Emylie Amster: ?Treating Nyjah Denio/Extender: Fredirick Maudlin ?McDonald, Adam ?Weeks in Treatment: 0 ?Primary Learner Assessed: Patient ?Learning Preferences/Education Level/Primary Language ?Learning Preference: Explanation, Demonstration, Printed Material ?Highest Education Level: College or Above ?Preferred Language: English ?Cognitive Barrier ?Language Barrier: No ?Translator Needed: No ?Memory Deficit: No ?Emotional Barrier: No ?Cultural/Religious Beliefs Affecting Medical Care: No ?Physical Barrier ?Impaired Vision: No ?Impaired Hearing: No ?Decreased Hand dexterity: No ?Knowledge/Comprehension ?Knowledge Level: High ?Comprehension Level: High ?Ability to understand written instructions: High ?Ability to understand verbal instructions: High ?Motivation ?Anxiety Level: Calm ?Cooperation: Cooperative ?Education Importance: Acknowledges Need ?Interest in Health Problems: Asks Questions ?Perception: Coherent ?Willingness to Engage in Self-Management High ?Activities: ?Readiness to Engage in Self-Management High ?Activities: ?Electronic Signature(s) ?Signed: 04/06/2021 6:09:44 PM By: Levan Hurst RN, BSN ?Entered By: Levan Hurst on 04/06/2021 14:30:54 ?-------------------------------------------------------------------------------- ?Fall Risk Assessment Details ?Patient Name: ?Date of Service: ?Carrie Everts, MA RTHA 04/06/2021 1:15 PM ?Medical Record Number: [id number] ?Patient Account Number: [id number] ?Date of Birth/Sex: ?Treating RN: ?December 06, 1945 (76 y.o. [name], Shatara ?Primary Care Makiah Foye: Gilford Silvius,  [name] ?Other Clinician: ?Referring Mali Eppard: ?Treating Alzina Golda/Extender: Fredirick Maudlin ?McDonald, Adam ?Weeks in Treatment: 0 ?Fall Risk Assessment Items ?Have you had 2 or more falls in the last 12 monthso 0 No ?Have you had any fall that resulted in injury in the last 12 monthso 0 No ?FALLS RISK SCREEN ?History of falling - immediate or within 3 months 0 No ?Secondary diagnosis (Do you have 2 or more medical diagnoseso) 15 Yes ?Ambulatory aid ?None/bed rest/wheelchair/nurse 0 Yes ?Crutches/cane/walker 0 No ?Furniture 0 No ?Intravenous therapy Access/Saline/Heparin Lock 0 No ?Gait/Transferring ?Normal/ bed rest/ wheelchair 0 No ?  Weak (short steps with or without shuffle, stooped but able to lift head while walking, may seek 10 Yes ?support from furniture) ?Impaired (short steps with shuffle, may have difficulty arising from chair, head down, impaired 0 No ?balance) ?Mental Status ?Oriented to own ability 0 Yes ?Electronic Signature(s) ?Signed: 04/06/2021 6:09:44 PM By: Levan Hurst RN, BSN ?Entered By: Levan Hurst on 04/06/2021 14:31:07 ?-------------------------------------------------------------------------------- ?Foot Assessment Details ?Patient Name: ?Date of Service: ?Carrie Everts, MA RTHA 04/06/2021 1:15 PM ?Medical Record Number: [id number] ?Patient Account Number: [id number] ?Date of Birth/Sex: ?Treating RN: ?03/17/1945 (76 y.o. [name], Shatara ?Primary Care Vivianne Carles: Gilford Silvius, [name] ?Other Clinician: ?Referring Mattie Nordell: ?Treating Chelse Matas/Extender: Fredirick Maudlin ?McDonald, Adam ?Weeks in Treatment: 0 ?Foot Assessment Items ?Site Locations ?+ = Sensation present, - = Sensation absent, C = Callus, U = Ulcer ?R = Redness, W = Warmth, M = Maceration, PU = Pre-ulcerative lesion ?F = Fissure, S = Swelling, D = Dryness ?Assessment ?Right: Left: ?Other Deformity: No No ?Prior Foot Ulcer: No No ?Prior Amputation: No No ?Charcot Joint: No No ?Ambulatory Status: Ambulatory With Help ?Assistance Device:  Wheelchair ?Gait: Unsteady ?Electronic Signature(s) ?Signed: 04/06/2021 6:09:44 PM By: Levan Hurst RN, BSN ?Entered By: Levan Hurst on 04/06/2021 14:31:38 ?-------------------------------------------------------------------------------- ?Nutrition Risk Screening Details ?Patient Name: ?Date of Service: ?Carrie Everts, MA RTHA 04/06/2021 1:15 PM ?Medical Record Number: [id number] ?Patient Account Number: [id number] ?Date of Birth/Sex: ?Treating RN: ?1945-09-24 (76 y.o. [name], Shatara ?Primary Care Domingos Riggi: Gilford Silvius, [name] ?Other Clinician: ?Referring Tyja Gortney: ?Treating Zahmir Lalla/Extender: Fredirick Maudlin ?McDonald, Adam ?Weeks in Treatment: 0 ?Height (in): ?Weight (lbs): ?Body Mass Index (BMI): ?Nutrition Risk Screening Items ?Score Screening ?NUTRITION RISK SCREEN: ?I have an illness or condition that made me change the kind and/or amount of food I eat 2 Yes ?I eat fewer than two meals per day 0 No ?I eat few fruits and vegetables, or milk products 0 No ?I have three or more drinks of beer, liquor or wine almost every day 0 No ?I have tooth or mouth problems that make it hard for me to eat 0 No ?I don't always have enough money to buy the food I need 0 No ?I eat alone most of the time 0 No ?I take three or more different prescribed or over-the-counter drugs a day 1 Yes ?Without wanting to, I have lost or gained 10 pounds in the last six months 0 No ?I am not always physically able to shop, cook and/or feed myself 2 Yes ?Nutrition Protocols ?Good Risk Protocol ?Moderate Risk Protocol 0 Provide education on nutrition ?High Risk Proctocol ?Risk Level: Moderate Risk ?Score: 5 ?Electronic Signature(s) ?Signed: 04/06/2021 6:09:44 PM By: Levan Hurst RN, BSN ?Entered By: Levan Hurst on 04/06/2021 14:31:17 ?

## 2021-04-06 NOTE — Assessment & Plan Note (Addendum)
Sepsis vs medication induced vs dehydration. ?New onset A.Fib also probably not helping matters. ? ?Getting empiric ABx in ED. ?1.5L IVF bolus in ED ?Checking procalcitonin. ?Holding coreg ?

## 2021-04-06 NOTE — Assessment & Plan Note (Signed)
Hold home 70/30 in setting of AKI ?Resistant scale SSI AC for the moment ?May need to add long-acting as well. ?

## 2021-04-06 NOTE — Assessment & Plan Note (Signed)
Wound care consult for wraps ?dont appear infected at this time. ?

## 2021-04-06 NOTE — H&P (Signed)
?History and Physical  ? ? ?Patient: [name] CHE:[id number] DOB: March 05, 1945 ?DOA: 04/06/2021 ?DOS: the patient was seen and examined on 04/06/2021 ?PCP: [name], NP  ?Patient coming from: Home ? ?Chief Complaint:  ?Chief Complaint  ?Patient presents with  ? Hypotension  ? ?HPI: [name] is a 76 y.o. female with medical history significant of morbid obesity, CPS, OSA, DM2, HTN. ? ?Pt with recent admit from 3/14-3/16 with hypotension, AKI, secondary to polypharmacy.  Pt improved with reduction in lyrica and opiates.  Pt discharged to SNF. ? ?Pt in to ED today from SNF:  Staff called EMS for blood pressure reading of 62/42, last EMS blood pressure 96/48. ? ?Pt with weeping wounds on BLE that were recently dressed by wound care. ? ?Pt given empiric ABx and 1.5L IVF for possible sepsis in ED. ? ? ?Review of Systems: As mentioned in the history of present illness. All other systems reviewed and are negative. ?Past Medical History:  ?Diagnosis Date  ? Coronary artery disease   ? Diabetes mellitus without complication ([place])   ? Hyperlipidemia   ? Hypertension   ? OSA (obstructive sleep apnea)   ? ?Past Surgical History:  ?Procedure Laterality Date  ? CARDIAC CATHETERIZATION    ? pheochromocytoma    ? TONSILLECTOMY    ? ?Social History:  reports that she quit smoking about 31 years ago. Her smoking use included cigarettes. She has a 20.00 pack-year smoking history. She has never used smokeless tobacco. She reports that she does not currently use alcohol. She reports that she does not currently use drugs. ? ?Allergies  ?Allergen Reactions  ? Metaxalone Anaphylaxis  ? Nitroglycerin   ?  BP bottoms out  ? Tramadol-Acetaminophen Anaphylaxis  ? Icosapent Ethyl Other (See Comments)  ?  Stomach pain, gas, diarrhea, headache from Vascepa  ? Brompheniramine-Pseudoeph   ?  Feels drunk  ? Meprobamate   ? Metformin Nausea Only  ?  "flushes"  ? Methyldopa   ?  Other reaction(s): Unknown  ? Metoprolol   ?  Other  reaction(s): Unknown  ? Procaine   ?  Passes out  ? Pseudoephedrine Hcl   ?  "feel drunk"  ? Tramadol   ?  "Made me go wonky, got fluid in my lungs"  ? ? ?Family History  ?Problem Relation Age of Onset  ? Heart attack Mother   ? Stroke Mother   ? Heart attack Father   ? ? ?Prior to Admission medications   ?Medication Sig Start Date End Date Taking? Authorizing Provider  ?acetaminophen (TYLENOL) 500 MG tablet Take 500 mg by mouth every 8 (eight) hours as needed for moderate pain or headache.    [provider]  ?albuterol (VENTOLIN HFA) 108 (90 Base) MCG/ACT inhaler Inhale 2 puffs into the lungs 4 (four) times daily as needed for wheezing or shortness of breath.    [provider]  ?aspirin EC 81 MG tablet Take 81 mg by mouth daily. Swallow whole.    [provider]  ?Adair Patter 200-25 MCG/INH AEPB Inhale 1 puff into the lungs daily. 04/22/20   [provider]  ?carvedilol (COREG) 25 MG tablet Take 25 mg by mouth 2 (two) times daily with a meal.    [provider]  ?cetirizine (ZYRTEC) 10 MG tablet Take 10 mg by mouth daily.    [provider]  ?Cholecalciferol (VITAMIN D3) 125 MCG (5000 UT) CAPS Take 5,000 Units by mouth.    [provider]  ?  diclofenac Sodium (VOLTAREN) 1 % GEL Apply 2-4 g topically in the morning, at noon, and at bedtime.    [provider]  ?ferrous sulfate 325 (65 FE) MG tablet Take 1 tablet (325 mg total) by mouth daily with breakfast. 02/06/21   [name], MD  ?fluticasone (FLONASE) 50 MCG/ACT nasal spray Place 1 spray into both nostrils daily as needed for allergies or rhinitis.    [provider]  ?glucose blood test strip as directed. Dispense Blood Glucose Test Strips to patient for home use. Testing 4 times daily. 01/16/19   [provider]  ?HYDROcodone-acetaminophen (NORCO/VICODIN) 5-325 MG tablet Take 1 tablet by mouth every 6 (six) hours as needed for moderate pain. 02/05/21   [name], MD  ?insulin NPH-regular Human (70-30) 100 UNIT/ML injection Inject 73 Units into the skin with breakfast, with lunch, and with evening meal. Sliding scale 60-73 units    [provider]  ?Insulin Syringe-Needle U-100 (INSULIN SYRINGE 1CC/31GX5/16") 31G X 5/16" 1 ML MISC USE TO INJECT INSULIN TWO TIMES A DAY 03/28/19   [provider]  ?ipratropium-albuterol (DUONEB) 0.5-2.5 (3) MG/3ML SOLN Take 3 mLs by nebulization every 6 (six) hours as needed (wheezing).    [provider]  ?Ixekizumab 80 MG/ML SOAJ Inject into the skin. Give subcutaneous every 4 weeks    [provider]  ?Lancets 33G MISC as directed. Testing 4 times daily.  Dispense Lancets to patient for home use. 01/16/19   [provider]  ?Lidocaine 4 % PTCH Apply 1 application. topically daily. Left shoulder    [provider]  ?melatonin 5 MG TABS Take 1 tablet (5 mg total) by mouth at bedtime. 02/05/21   [name], MD  ?Multiple Vitamin (MULTIVITAMIN ADULT PO) Take 1 tablet by mouth daily.    [provider]  ?OVER THE COUNTER MEDICATION Apply 1 application. topically daily. Zinc oxide external paste 40%  ?Apply to groin area    [provider]  ?pregabalin (LYRICA) 25 MG capsule Take 1 capsule (25 mg total) by mouth 2 (two) times daily. 04/02/21   Cherene Altes, MD  ?Respiratory Therapy Supplies DEVI as directed. BIPAP 18/14 cm H2O with humidifier, mask, tubing, and headgear. 03/14/19   [provider]  ?rOPINIRole (REQUIP) 0.25 MG tablet Take 0.25 mg by mouth daily as needed (RLS).    [provider]  ?rosuvastatin (CRESTOR) 20 MG tablet Take 20 mg by mouth at bedtime.    [provider]  ?senna-docusate (SENOKOT-S) 8.6-50 MG tablet Take 1 tablet by mouth 2 (two) times daily. 02/05/21   [name], MD  ?tiotropium (SPIRIVA HANDIHALER) 18 MCG inhalation capsule Place 18 mcg into inhaler and inhale daily.    [provider]   ?Torsemide 60 MG TABS Take 60 mg by mouth daily.    [provider]  ?vitamin B-12 (CYANOCOBALAMIN) 1000 MCG tablet Take 1,000 mcg by mouth daily.    [provider]  ? ? ?Physical Exam: ?Vitals:  ? 04/06/21 2130 04/06/21 2208 04/06/21 2230 04/06/21 2330  ?BP: (!) 73/51 (!) 76/45 (!) 88/64 (!) 89/55  ?Pulse: 65 100 99 (!) 108  ?Resp: '15 15 15 17  '$ ?Temp:      ?TempSrc:      ?SpO2: 96% 100% 100% 99%  ? ?Constitutional: Uncomfortable due to hard stretcher, morbidly obese ?Eyes: PERRL, lids and conjunctivae normal ?ENMT: Mucous membranes are moist. Posterior pharynx clear of any exudate or lesions.Normal dentition.  ?Neck: normal, supple, no  masses, no thyromegaly ?Respiratory: clear to auscultation bilaterally, no wheezing, no crackles. Normal respiratory effort. No accessory muscle use.  ?Cardiovascular: IRR, IRR, BLE edema ?Abdomen: no tenderness, no masses palpated. No hepatosplenomegaly. Bowel sounds positive.  ?Musculoskeletal: no clubbing / cyanosis. No joint deformity upper and lower extremities. Good ROM, no contractures. Normal muscle tone.  ?Skin: no rashes, lesions, ulcers. No induration ?Neurologic: CN 2-12 grossly intact. Sensation intact, DTR normal. Strength 5/5 in all 4.  ?Psychiatric: Normal judgment and insight. Alert and oriented x 3. Normal mood.  ? ?Data Reviewed: ? ?  ?CBC ?   ?Component Value Date/Time  ? WBC 10.7 (H) 04/06/2021 2027  ? RBC 2.93 (L) 04/06/2021 2027  ? HGB 8.5 (L) 04/06/2021 2033  ? HGB 11.1 08/16/2019 1424  ? HCT 25.0 (L) 04/06/2021 2033  ? HCT 33.6 (L) 08/16/2019 1424  ? PLT 249 04/06/2021 2027  ? PLT 180 08/16/2019 1424  ? MCV 94.2 04/06/2021 2027  ? MCV 94 08/16/2019 1424  ? MCH 27.0 04/06/2021 2027  ? MCHC 28.6 (L) 04/06/2021 2027  ? RDW 17.7 (H) 04/06/2021 2027  ? RDW 16.6 (H) 08/16/2019 1424  ? LYMPHSABS 0.7 04/06/2021 2027  ? MONOABS 0.9 04/06/2021 2027  ? EOSABS 0.1 04/06/2021 2027  ? BASOSABS 0.0 04/06/2021 2027  ? ?CMP  ?   ?Component Value Date/Time   ? NA 140 04/06/2021 2033  ? NA 142 03/04/2021 1247  ? K 4.0 04/06/2021 2033  ? CL 100 04/06/2021 2033  ? CO2 29 04/06/2021 2027  ? GLUCOSE 133 (H) 04/06/2021 2033  ? BUN 49 (H) 04/06/2021 2033  ? BUN 31 (H) 03/04/2021

## 2021-04-06 NOTE — Assessment & Plan Note (Signed)
Pt with hypotension today causing AKI. ?Holding all home BP meds ?

## 2021-04-07 ENCOUNTER — Observation Stay (HOSPITAL_BASED_OUTPATIENT_CLINIC_OR_DEPARTMENT_OTHER): Payer: Medicare Other

## 2021-04-07 ENCOUNTER — Ambulatory Visit: Payer: Medicare Other | Admitting: Podiatry

## 2021-04-07 DIAGNOSIS — L97919 Non-pressure chronic ulcer of unspecified part of right lower leg with unspecified severity: Secondary | ICD-10-CM | POA: Diagnosis present

## 2021-04-07 DIAGNOSIS — N390 Urinary tract infection, site not specified: Secondary | ICD-10-CM | POA: Diagnosis present

## 2021-04-07 DIAGNOSIS — I4891 Unspecified atrial fibrillation: Secondary | ICD-10-CM | POA: Diagnosis not present

## 2021-04-07 DIAGNOSIS — Z66 Do not resuscitate: Secondary | ICD-10-CM | POA: Diagnosis present

## 2021-04-07 DIAGNOSIS — G894 Chronic pain syndrome: Secondary | ICD-10-CM | POA: Diagnosis present

## 2021-04-07 DIAGNOSIS — I4819 Other persistent atrial fibrillation: Secondary | ICD-10-CM | POA: Diagnosis not present

## 2021-04-07 DIAGNOSIS — Z8616 Personal history of COVID-19: Secondary | ICD-10-CM | POA: Diagnosis not present

## 2021-04-07 DIAGNOSIS — I480 Paroxysmal atrial fibrillation: Secondary | ICD-10-CM | POA: Diagnosis present

## 2021-04-07 DIAGNOSIS — J9611 Chronic respiratory failure with hypoxia: Secondary | ICD-10-CM | POA: Diagnosis present

## 2021-04-07 DIAGNOSIS — I959 Hypotension, unspecified: Secondary | ICD-10-CM | POA: Diagnosis present

## 2021-04-07 DIAGNOSIS — I2510 Atherosclerotic heart disease of native coronary artery without angina pectoris: Secondary | ICD-10-CM | POA: Diagnosis present

## 2021-04-07 DIAGNOSIS — N1831 Chronic kidney disease, stage 3a: Secondary | ICD-10-CM | POA: Diagnosis present

## 2021-04-07 DIAGNOSIS — D631 Anemia in chronic kidney disease: Secondary | ICD-10-CM | POA: Diagnosis present

## 2021-04-07 DIAGNOSIS — E1159 Type 2 diabetes mellitus with other circulatory complications: Secondary | ICD-10-CM | POA: Diagnosis not present

## 2021-04-07 DIAGNOSIS — I5033 Acute on chronic diastolic (congestive) heart failure: Secondary | ICD-10-CM | POA: Diagnosis present

## 2021-04-07 DIAGNOSIS — E662 Morbid (severe) obesity with alveolar hypoventilation: Secondary | ICD-10-CM | POA: Diagnosis present

## 2021-04-07 DIAGNOSIS — E869 Volume depletion, unspecified: Secondary | ICD-10-CM | POA: Diagnosis present

## 2021-04-07 DIAGNOSIS — I10 Essential (primary) hypertension: Secondary | ICD-10-CM | POA: Diagnosis not present

## 2021-04-07 DIAGNOSIS — L97929 Non-pressure chronic ulcer of unspecified part of left lower leg with unspecified severity: Secondary | ICD-10-CM | POA: Diagnosis present

## 2021-04-07 DIAGNOSIS — E896 Postprocedural adrenocortical (-medullary) hypofunction: Secondary | ICD-10-CM | POA: Diagnosis present

## 2021-04-07 DIAGNOSIS — N179 Acute kidney failure, unspecified: Secondary | ICD-10-CM | POA: Diagnosis present

## 2021-04-07 DIAGNOSIS — E785 Hyperlipidemia, unspecified: Secondary | ICD-10-CM | POA: Diagnosis present

## 2021-04-07 DIAGNOSIS — Z20822 Contact with and (suspected) exposure to covid-19: Secondary | ICD-10-CM | POA: Diagnosis present

## 2021-04-07 DIAGNOSIS — I130 Hypertensive heart and chronic kidney disease with heart failure and stage 1 through stage 4 chronic kidney disease, or unspecified chronic kidney disease: Secondary | ICD-10-CM | POA: Diagnosis present

## 2021-04-07 DIAGNOSIS — J449 Chronic obstructive pulmonary disease, unspecified: Secondary | ICD-10-CM | POA: Diagnosis present

## 2021-04-07 DIAGNOSIS — E1122 Type 2 diabetes mellitus with diabetic chronic kidney disease: Secondary | ICD-10-CM | POA: Diagnosis present

## 2021-04-07 DIAGNOSIS — Z6841 Body Mass Index (BMI) 40.0 and over, adult: Secondary | ICD-10-CM | POA: Diagnosis not present

## 2021-04-07 DIAGNOSIS — I248 Other forms of acute ischemic heart disease: Secondary | ICD-10-CM | POA: Diagnosis present

## 2021-04-07 LAB — ECHOCARDIOGRAM COMPLETE
AR max vel: 1.65 cm2
AV Peak grad: 12.5 mmHg
Ao pk vel: 1.77 m/s
Area-P 1/2: 3.33 cm2
Calc EF: 46.8 %
Height: 62 in
S' Lateral: 4.1 cm
Single Plane A2C EF: 50 %
Single Plane A4C EF: 45.6 %
Weight: 5414.5 oz

## 2021-04-07 LAB — URINALYSIS, ROUTINE W REFLEX MICROSCOPIC
Bilirubin Urine: NEGATIVE
Glucose, UA: NEGATIVE mg/dL
Ketones, ur: NEGATIVE mg/dL
Nitrite: POSITIVE — AB
Protein, ur: 100 mg/dL — AB
Specific Gravity, Urine: 1.011 (ref 1.005–1.030)
WBC, UA: >50 WBC/hpf — ABNORMAL HIGH (ref 0–5)
pH: 5 (ref 5.0–8.0)

## 2021-04-07 LAB — HEPARIN LEVEL (UNFRACTIONATED)
Heparin Unfractionated: 0.1 IU/mL — ABNORMAL LOW (ref 0.30–0.70)
Heparin Unfractionated: 0.34 IU/mL (ref 0.30–0.70)

## 2021-04-07 LAB — LACTIC ACID, PLASMA: Lactic Acid, Venous: 1.2 mmol/L (ref 0.5–1.9)

## 2021-04-07 LAB — GLUCOSE, CAPILLARY
Glucose-Capillary: 145 mg/dL — ABNORMAL HIGH (ref 70–99)
Glucose-Capillary: 244 mg/dL — ABNORMAL HIGH (ref 70–99)

## 2021-04-07 LAB — CBG MONITORING, ED
Glucose-Capillary: 122 mg/dL — ABNORMAL HIGH (ref 70–99)
Glucose-Capillary: 140 mg/dL — ABNORMAL HIGH (ref 70–99)
Glucose-Capillary: 147 mg/dL — ABNORMAL HIGH (ref 70–99)

## 2021-04-07 LAB — TROPONIN I (HIGH SENSITIVITY): Troponin I (High Sensitivity): 45 ng/L — ABNORMAL HIGH (ref <18)

## 2021-04-07 LAB — PROCALCITONIN: Procalcitonin: <0.1 ng/mL

## 2021-04-07 LAB — HEMOGLOBIN A1C
Hgb A1c MFr Bld: 6.3 % — ABNORMAL HIGH (ref 4.8–5.6)
Mean Plasma Glucose: 134.11 mg/dL

## 2021-04-07 MED ORDER — HEPARIN BOLUS VIA INFUSION
4000.0000 [IU] | Freq: Once | INTRAVENOUS | Status: AC
  Administered 2021-04-07: 4000 [IU] via INTRAVENOUS
  Filled 2021-04-07: qty 4000

## 2021-04-07 MED ORDER — PERFLUTREN LIPID MICROSPHERE
1.0000 mL | INTRAVENOUS | Status: DC | PRN
  Administered 2021-04-07: 2 mL via INTRAVENOUS
  Filled 2021-04-07: qty 10

## 2021-04-07 MED ORDER — HEPARIN (PORCINE) 25000 UT/250ML-% IV SOLN
1900.0000 [IU]/h | INTRAVENOUS | Status: AC
Start: 2021-04-07 — End: 2021-04-08
  Administered 2021-04-07: 1500 [IU]/h via INTRAVENOUS
  Administered 2021-04-07: 1800 [IU]/h via INTRAVENOUS
  Administered 2021-04-08: 1900 [IU]/h via INTRAVENOUS
  Filled 2021-04-07 (×3): qty 250

## 2021-04-07 MED ORDER — HEPARIN BOLUS VIA INFUSION
2000.0000 [IU] | Freq: Once | INTRAVENOUS | Status: AC
  Administered 2021-04-07: 2000 [IU] via INTRAVENOUS
  Filled 2021-04-07: qty 2000

## 2021-04-07 MED ORDER — SODIUM CHLORIDE 0.9 % IV SOLN
1.0000 g | INTRAVENOUS | Status: DC
  Administered 2021-04-07 – 2021-04-09 (×3): 1 g via INTRAVENOUS
  Filled 2021-04-07 (×3): qty 10

## 2021-04-07 MED ORDER — UMECLIDINIUM BROMIDE 62.5 MCG/ACT IN AEPB
1.0000 | INHALATION_SPRAY | Freq: Every day | RESPIRATORY_TRACT | Status: DC
  Administered 2021-04-07 – 2021-04-17 (×8): 1 via RESPIRATORY_TRACT
  Filled 2021-04-07 (×3): qty 7

## 2021-04-07 NOTE — ED Notes (Signed)
Pt transferred to bariatric bed

## 2021-04-07 NOTE — Progress Notes (Signed)
? [name]  CBS:[id number] DOB: [birth year] DOA: 04/06/2021 ?PCP: [name], NP   ? ?Brief Narrative:  ?76 year old with a history of morbid obesity, chronic hypoxic respiratory failure on 6 L nasal cannula at baseline, chronic diastolic CHF, chronic venous stasis, DM2, HTN, HLD, and chronic anemia who was admitted to [facility] 3/13 > 04/02/2021 for AMS and AKI felt to be due to polypharmacy and who was returned to the ED from her SNF due to hypotension (64/42).  ? ?There was initial concern in the ED she was septic due to chronic B LE wounds, but on careful physical exam there was no evidence of an actual wound infection/cellulitis. While in the ED she was noted to be in Afib w/ RVR. This is a new finding for her.  ? ?Consultants:  ?None ? ?Code Status: NO CODE BLUE ? ?DVT prophylaxis: ?IV heparin ? ?Interim Hx: ?Blood pressure has improved since admission and is currently at her recent baseline of 90-110.  She is still experiencing episodes of tachycardia with a maximum heart rate since admission of 110.  Saturations are 99% on baseline O2 support.  She is sitting up in a bedside chair at the time of my exam alert oriented and in no distress.  She denies chest pain or shortness of breath.  Lower extremity edema persist.  She has no new complaints. ? ?Assessment & Plan: ? ?Newly diagnosed atrial fibrillation ?Was not present last week during admission -rate presently well controlled -monitor on telemetry-check TSH ? ?Hypotension ?Likely related to volume depletion +/- atrial fibrillation, but I am also concerned about the possibility of adrenal insufficiency given her history of a right adrenalectomy -check cortisol level in a.m. ? ?Possible UTI ?Urinalysis at admission significantly abnormal -on empiric antibiotic, but will narrow spectrum - follow urine culture ? ?Acute kidney injury ?Creatinine had improved to 1.48 at time of discharge 3/16 from a peak of 3.53 3/13 -not surprisingly with episodes of  hypotension creatinine has increased again -hydrate and follow trend ? ?Hx of Pheochromocytoma s/p remote right adrenalectomy ?Clinical scenario suggestive of possibly adrenal insufficiency -checking serum cortisol level in a.m. -clinically stable presently but if acutely decompensates would give stress dose hydrocortisone empirically ? ?Normocytic anemia of chronic disease ?Hemoglobin 8-8.5 during recent hospitalization -no evidence of acute blood loss -monitor trend in serial fashion ? ?Multiple open leg wounds - chronic venous stasis bilateral lower extremities ?Was cared for by [facility] during prior admission with no evidence of active infection -still no evidence of active infection this admission -hold any further antibiotic therapy and continue wound care ? ?Chronic pain syndrome ?Care with narcotics as patient was found to be high risk for medication induced obtundation during last admission -patient did not feel Lyrica was helping her neuropathic pain therefore it has been discontinued ? ?Chronic diastolic CHF ?Suspect lower extremity edema is primarily related to venous stasis/obesity -ability to diuresis greatly limited by hypotension and probable intravascular volume depletion ? ?DM2 ?CBG presently controlled -A1c 6.3 ? ?OSA on CPAP ? ?Morbid obesity - Body mass index is 61.9 kg/m?. ? ? ?Family Communication: No family present at time of exam ?Disposition: from SNF - anticipate need to return to SNF when medically stable  ? ?Objective: ?Blood pressure (!) 94/53, pulse (!) 106, temperature 98.3 ?F (36.8 ?C), temperature source Oral, resp. rate 16, height '5\' 2"'$  (1.575 m), weight (!) 153.5 kg, SpO2 99 %. ?No intake or output data in the 24 hours ending 04/07/21 0742 ?Filed Weights  ?  04/07/21 0000  ?Weight: (!) 153.5 kg  ? ? ?Examination: ?General: No acute respiratory distress ?Lungs: Breath sounds distant but equal with no wheezing ?Cardiovascular: Heart sounds distant but regular without murmur or  rub ?Abdomen: Morbidly obese, soft, bowel sounds positive, no appreciable mass but exam difficult ?Extremities: 3+ pitting bilateral lower extremity edema with wounds dressed and dry ? ?CBC: ?Recent Labs  ?Lab 04/01/21 ?1610 04/06/21 ?2027 04/06/21 ?2033  ?WBC 8.0 10.7*  --   ?NEUTROABS  --  8.9*  --   ?HGB 8.4* 7.9* 8.5*  ?HCT 27.2* 27.6* 25.0*  ?MCV 90.4 94.2  --   ?PLT 221 249  --   ? ?Basic Metabolic Panel: ?Recent Labs  ?Lab 04/01/21 ?9604 04/02/21 ?0151 04/06/21 ?2027 04/06/21 ?2033  ?NA 137 136 139 140  ?K 4.6 4.6 4.1 4.0  ?CL 98 98 96* 100  ?CO2 '30 27 29  '$ --   ?GLUCOSE 168* 221* 132* 133*  ?BUN 81* 72* 55* 49*  ?CREATININE 1.93* 1.48* 1.90* 1.90*  ?CALCIUM 9.1 9.1 8.7*  --   ? ?GFR: ?Estimated Creatinine Clearance: 37.5 mL/min (A) (by C-G formula based on SCr of 1.9 mg/dL (H)). ? ?Liver Function Tests: ?Recent Labs  ?Lab 04/06/21 ?2027  ?AST 19  ?ALT 20  ?ALKPHOS 37*  ?BILITOT 0.4  ?PROT 6.5  ?ALBUMIN 3.0*  ? ? ?HbA1C: ?Hgb A1c MFr Bld  ?Date/Time Value Ref Range Status  ?04/07/2021 12:06 AM 6.3 (H) 4.8 - 5.6 % Final  ?  Comment:  ?  (NOTE) ?Pre diabetes:          5.7%-6.4% ? ?Diabetes:              >6.4% ? ?Glycemic control for   <7.0% ?adults with diabetes ?  ?02/01/2021 03:25 AM 7.2 (H) 4.8 - 5.6 % Final  ?  Comment:  ?  (NOTE) ?Pre diabetes:          5.7%-6.4% ? ?Diabetes:              >6.4% ? ?Glycemic control for   <7.0% ?adults with diabetes ?  ? ? ?CBG: ?Recent Labs  ?Lab 04/01/21 ?2117 04/02/21 ?5409 04/02/21 ?1117 04/07/21 ?8119 04/07/21 ?0737  ?GLUCAP 285* 191* 244* 122* 140*  ? ? ?Scheduled Meds: ? aspirin EC  81 mg Oral Daily  ? ferrous sulfate  325 mg Oral Q breakfast  ? fluticasone furoate-vilanterol  1 puff Inhalation Daily  ? insulin aspart  0-20 Units Subcutaneous TID WC  ? insulin aspart  0-5 Units Subcutaneous QHS  ? lidocaine  1 patch Transdermal Daily  ? rosuvastatin  20 mg Oral QHS  ? senna-docusate  1 tablet Oral BID  ? tiotropium  18 mcg Inhalation Daily  ? ?Continuous Infusions: ?  ceFEPime (MAXIPIME) IV Stopped (04/06/21 2254)  ? heparin 1,500 Units/hr (04/07/21 0235)  ? [START ON 04/08/2021] vancomycin    ? ? ? LOS: 0 days  ? ?Cherene Altes, MD ?[facility] ?Office  [phone] ?Pager - Text Page per Shea Evans ? ?If 7PM-7AM, please contact night-coverage per Amion ?04/07/2021, 7:42 AM ? ? ? ?

## 2021-04-07 NOTE — Plan of Care (Signed)
  Problem: Education: Goal: Knowledge of General Education information will improve Description Including pain rating scale, medication(s)/side effects and non-pharmacologic comfort measures Outcome: Progressing   

## 2021-04-07 NOTE — Progress Notes (Signed)
ANTICOAGULATION CONSULT NOTE -  ? ?Pharmacy Consult for heparin ?Indication: atrial fibrillation ? ?Allergies  ?Allergen Reactions  ? Metaxalone Anaphylaxis  ? Nitroglycerin   ?  BP bottoms out  ? Tramadol-Acetaminophen Anaphylaxis  ? Icosapent Ethyl Other (See Comments)  ?  Stomach pain, gas, diarrhea, headache from Vascepa  ? Brompheniramine-Pseudoeph   ?  Feels drunk  ? Meprobamate   ? Metformin Nausea Only  ?  "flushes"  ? Methyldopa   ?  Other reaction(s): Unknown  ? Metoprolol   ?  Other reaction(s): Unknown  ? Procaine   ?  Passes out  ? Pseudoephedrine Hcl   ?  "feel drunk"  ? Tramadol   ?  "Made me go wonky, got fluid in my lungs"  ? ? ?Patient Measurements: ?Height: '5\' 2"'$  (157.5 cm) ?Weight: (!) 153.5 kg (338 lb 6.5 oz) ?IBW/kg (Calculated) : 50.1 ?Heparin Dosing Weight: 90kg ? ?Vital Signs: ?BP: 101/64 (03/21 1030) ?Pulse Rate: 107 (03/21 1030) ? ?Labs: ?Recent Labs  ?  04/06/21 ?2027 04/06/21 ?2033 04/07/21 ?0006 04/07/21 ?0950  ?HGB 7.9* 8.5*  --   --   ?HCT 27.6* 25.0*  --   --   ?PLT 249  --   --   --   ?LABPROT 16.0*  --   --   --   ?INR 1.3*  --   --   --   ?HEPARINUNFRC  --   --   --  0.10*  ?CREATININE 1.90* 1.90*  --   --   ?TROPONINIHS 41*  --  45*  --   ? ? ?Estimated Creatinine Clearance: 37.5 mL/min (A) (by C-G formula based on SCr of 1.9 mg/dL (H)). ? ? ?Medical History: ?Past Medical History:  ?Diagnosis Date  ? Coronary artery disease   ? Diabetes mellitus without complication ([place])   ? Hyperlipidemia   ? Hypertension   ? OSA (obstructive sleep apnea)   ? ? ?Assessment: ?77yo female presents to ED from SNF w/ hypotension, found to have AKI and new-onset Afib and initiated on IV Heparin per pharmacy dosing consult.  ? ?Initial Heparin level is detectable but low at 0.10 after 4000 unit bolus on rate of 1500 units/hr.  ? ?Hgb was low at 8.5, Platelets are within normal limits.  ? ? ?Goal of Therapy:  ?Heparin level 0.3-0.7 units/ml ?Monitor platelets by anticoagulation protocol: Yes ?   ?Plan:  ?Heparin 2000 units x1 then increase Heparin rate to 1800 units/hr.  ?Recheck in 8 hours.  ? ?Sloan Leiter, PharmD, BCPS, BCCCP ?Clinical Pharmacist ?Please refer to [facility] for [place] numbers ?04/07/2021,10:56 AM ? ? ?

## 2021-04-07 NOTE — ED Notes (Signed)
Placed breakfast order ?

## 2021-04-07 NOTE — Consult Note (Addendum)
WOC Nurse Consult Note: ?Patient receiving care in [facility] ED025 ?Patient currently on a Bariatric Air Mattress but is complaining that it is very uncomfortable and would like to be placed in a recliner as this is what she sleeps in at [place] where she lives. Both legs were wrapped and she requested this be removed because it is hurting her legs.  ?Reason for Consult: BLE wounds ?Wound type: Scattered partial thickness ulcers on the BLE, left worse than the right and very painful.  ?Pressure Injury POA: NA ?Wound bed: Pink and moist ?Drainage (amount, consistency, odor) Sanguinous ?Periwound: erythmatous ?Dressing procedure/placement/frequency: ?Gently cleanse the BLE with soap and water, rinse and pat dry. Apply Xeroform gauze over the open ulcers on the LLE and secure with a few turns of Kerlix. RLE apply Xeroform gauze over the open wounds and secure with foam dressings.  ? ?Monitor the wound area(s) for worsening of condition such as: ?Signs/symptoms of infection, increase in size, development of or worsening of odor, ?development of pain, or increased pain at the affected locations.   ?Notify the medical team if any of these develop. ? ?Thank you for the consult. [facility] nurse will not follow at this time.   ?Please re-consult the [place] team if needed. ? ?Cathlean Marseilles. Tamala Julian, MSN, RN, CMSRN, AGCNS, WTA ?Wound Treatment Associate ?Pager [phone]   ? ? ?  ?

## 2021-04-07 NOTE — ED Notes (Signed)
RN updated son, Jordan Caraveo  ?

## 2021-04-07 NOTE — ED Notes (Signed)
Help get patient pulled up in bed patient is resting with wound nurse at bedside ?

## 2021-04-07 NOTE — Progress Notes (Signed)
[place], [place] ([id number]) ?Visit Report for 04/06/2021 ?Chief Complaint Document Details ?Patient Name: Date of Service: ?Carrie Everts, MA RTHA 04/06/2021 1:15 PM ?Medical Record Number: [id number] ?Patient Account Number: [id number] ?Date of Birth/Sex: Treating RN: ?01-Sep-1945 (76 y.o. F) ?Primary Care Provider: Gilford Silvius, [name] Other Clinician: ?Referring Provider: ?Treating Provider/Extender: Fredirick Maudlin ?McDonald, Adam ?Weeks in Treatment: 0 ?Information Obtained from: Patient ?Chief Complaint ?Patient presents for treatment of an open ulcer due to venous insufficiency (bilateral) ?Electronic Signature(s) ?Signed: 04/06/2021 2:54:22 PM By: Fredirick Maudlin MD FACS ?Entered By: Fredirick Maudlin on 04/06/2021 14:54:21 ?-------------------------------------------------------------------------------- ?HPI Details ?Patient Name: Date of Service: ?Carrie Everts, MA RTHA 04/06/2021 1:15 PM ?Medical Record Number: [id number] ?Patient Account Number: [id number] ?Date of Birth/Sex: Treating RN: ?08-Nov-1945 (76 y.o. F) ?Primary Care Provider: Gilford Silvius, [name] Other Clinician: ?Referring Provider: ?Treating Provider/Extender: Fredirick Maudlin ?McDonald, Adam ?Weeks in Treatment: 0 ?History of Present Illness ?HPI Description: ADMISSION ?04/06/21 ?This is a 76 year old super morbidly obese woman with a past medical history significant for chronic diastolic heart failure, type 2 diabetes (last A1c 7.2), ?chronic hypoxic respiratory failure on 6 L nasal cannula, chronic renal insufficiency, and venous insufficiency. She was recently hospitalized with altered ?mental status and in reading the electronic medical record, it sounds as though somebody at her care facility was attempting to better regulate her chronic pain ?and gave her some additional narcotic which resulted in her mental status changes. She was also found to have acute on chronic kidney injury. She resides at ?[facility] and was previously on the independent side, but is now on  the skilled nursing side of the facility. She has developed weeping leg wounds on the ?bilateral lower extremities. There are 2 superficial lesions on the right anterior lower leg and and essentially circumferential wound to the left lower extremity. ?They are weeping clear fluid. The patient endorses significant pain and we are unable to do much in clinic today. She does take a diuretic, but she does not ?wear compression stockings as she says they make her claustrophobic. She is unable to tolerate any significant wrapping of her legs for the same reason. ?They have apparently been placing foam and Kerlix on the left leg and just foam on the right leg wounds. We are unable to obtain ABIs in clinic today ?secondary to patient inability to remain in 1 position for any period of time. She is unable to tolerate having her legs elevated secondary to pain as well as ?dyspnea. We were able to obtain Doppler pulses at the dorsalis pedis and posterior tibialis today. She has not had any formal vascular studies of any nature. ?She did say that somebody did an ultrasound of her legs at her facility but it sounds like this was likely to look for deep vein thrombosis; we do not have ?access to this report. ?Electronic Signature(s) ?Signed: 04/06/2021 3:07:26 PM By: Fredirick Maudlin MD FACS ?Entered By: Fredirick Maudlin on 04/06/2021 15:07:26 ?-------------------------------------------------------------------------------- ?Physical Exam Details ?Patient Name: Date of Service: ?Carrie Everts, MA RTHA 04/06/2021 1:15 PM ?Medical Record Number: [id number] ?Patient Account Number: [id number] ?Date of Birth/Sex: Treating RN: ?[birth year] (76 y.o. F) ?Primary Care Provider: Gilford Silvius, [name] Other Clinician: ?Referring Provider: ?Treating Provider/Extender: Fredirick Maudlin ?McDonald, Adam ?Weeks in Treatment: 0 ?Constitutional ?. Mildly tachycardic, asymptomatic. . . Super morbid obesity. ?Respiratory ?Although she is not in acute distress, her  breathing is clearly effortful. She is on 6 L of nasal cannula oxygen.Marland Kitchen ?Cardiovascular ?Nonpalpable pedal pulses bilaterally. Intake nurse was able to obtain good Doppler signals  at both the dorsalis pedis and posterior tibialis bilaterally.. 2+ pitting ?edema to the bilateral lower extremities.Marland Kitchen ?Notes ?04/06/2021: Wound examon the anterior tibial surface of the right lower extremity, there are 2 open areas, limited to skin breakdown. They are weeping clear ?serous fluid. There is no significant slough or exudate. On the left lower extremity, there is a circumferential wound, similar in appearance to those seen on the ?anterior tibial surface on the right. The surface is moist and draining serous fluid. The periwound skin is slightly erythematous, but this appears most consistent ?with stasis dermatitis, rather than infection. ?Electronic Signature(s) ?Signed: 04/06/2021 4:01:15 PM By: Fredirick Maudlin MD FACS ?Previous Signature: 04/06/2021 3:26:35 PM Version By: Fredirick Maudlin MD FACS ?Entered By: Fredirick Maudlin on 04/06/2021 16:01:15 ?-------------------------------------------------------------------------------- ?Physician Orders Details ?Patient Name: Date of Service: ?Carrie Everts, MA RTHA 04/06/2021 1:15 PM ?Medical Record Number: [id number] ?Patient Account Number: [id number] ?Date of Birth/Sex: Treating RN: ?Jul 12, 1945 (76 y.o. Carrie Sprague, [name] ?Primary Care Provider: Gilford Silvius, [name] Other Clinician: ?Referring Provider: ?Treating Provider/Extender: Fredirick Maudlin ?McDonald, Adam ?Weeks in Treatment: 0 ?Verbal / Phone Orders: No ?Diagnosis Coding ?ICD-10 Coding ?Code Description ?G89.16 Chronic diastolic (congestive) heart failure ?I25.10 Atherosclerotic heart disease of native coronary artery without angina pectoris ?E11.622 Type 2 diabetes mellitus with other skin ulcer ?X45.038 Non-pressure chronic ulcer of left calf limited to breakdown of skin ?J96.11 Chronic respiratory failure with hypoxia ?E66.01  Morbid (severe) obesity due to excess calories ?Follow-up Appointments ?ppointment in 1 week. - Dr. Celine Ahr - Room 2 ?Return A ?Bathing/ Shower/ Hygiene ?May shower with protection but do not get wound dressing(s) wet. - Ok to use cast protector ?Edema Control - Lymphedema / SCD / Other ?Elevate legs to the level of the heart or above for 30 minutes daily and/or when sitting, a frequency of: - throughout the day ?Avoid standing for long periods of time. ?Wound Treatment ?Wound #1 - Lower Leg Wound Laterality: Left, Circumferential ?Cleanser: Wound Cleanser Every Other Day/15 Days ?Discharge Instructions: Cleanse the wound with wound cleanser or normal salineprior to applying a clean dressing using gauze sponges, not tissue or ?cotton balls. ?Peri-Wound Care: Sween Lotion (Moisturizing lotion) Every Other Day/15 Days ?Discharge Instructions: Apply moisturizing lotion as directed ?Prim Dressing: PolyMem Silver Non-Adhesive Dressing, 4.25x4.25 in Every Other Day/15 Days ?ary ?Discharge Instructions: Apply to wound bed as. Ok to use silver alginate if Polymem not available. ?Secondary Dressing: Woven Gauze Sponge, Non-Sterile 4x4 in Every Other Day/15 Days ?Discharge Instructions: Apply over primary dressing as directed. ?Secondary Dressing: ABD Pad, 8x10 Every Other Day/15 Days ?Discharge Instructions: Apply over primary dressing as directed. ?Compression Wrap: Kerlix Roll 4.5x3.1 (in/yd) Every Other Day/15 Days ?Discharge Instructions: Apply Kerlix and Coban compression as directed. ?Compression Wrap: Coban Self-Adherent Wrap 4x5 (in/yd) Every Other Day/15 Days ?Discharge Instructions: Apply over Kerlix as directed. ?Wound #2 - Lower Leg Wound Laterality: Right, Anterior, Proximal ?Cleanser: Wound Cleanser Every Other Day/15 Days ?Discharge Instructions: Cleanse the wound with wound cleanser or normal salineprior to applying a clean dressing using gauze sponges, not tissue or ?cotton balls. ?Peri-Wound Care: Sween  Lotion (Moisturizing lotion) Every Other Day/15 Days ?Discharge Instructions: Apply moisturizing lotion as directed ?Prim Dressing: PolyMem Silver Non-Adhesive Dressing, 4.25x4.25 in Every Other Day/15 Da

## 2021-04-07 NOTE — Progress Notes (Signed)
Patient refused CPAP for tonight. Pt states that she is claustrophobic and cannot tolerate our masks. Pt uses nasal pillows mask at home but does not have anyone to bring her mask to [facility] at this time. RT instructed patient to have RT called if she decides to try our mask. RT will monitor as needed. ?

## 2021-04-07 NOTE — Progress Notes (Signed)
New Admission Note:  ? ?Arrival Method: stretcher  ?Mental Orientation: Alert and oriented  ?Telemetry: Box 4  ?Assessment: Completed ?Skin: Wounds on legs  ?HG:[id number] upper arm  ?Pain: 10/10 ?Tubes: ?Safety Measures: Safety Fall Prevention Plan has been given, discussed and signed ?Admission: Completed ?[facility] Orientation: Patient has been orientated to the room, unit and staff.  ?Family: none at the bedside  ? ?Orders have been reviewed and implemented. Will continue to monitor the patient. Call light has been placed within reach and bed alarm has been activated.  ? ?Mansur Patti RN ?[facility] [place] ?Phone: [phone]  ?

## 2021-04-07 NOTE — Progress Notes (Signed)
ANTICOAGULATION CONSULT NOTE - Follow Up Consult ? ?Pharmacy Consult for heparin ?Indication: atrial fibrillation ? ?Allergies  ?Allergen Reactions  ? Metaxalone Anaphylaxis  ? Nitroglycerin   ?  BP bottoms out  ? Tramadol-Acetaminophen Anaphylaxis  ? Icosapent Ethyl Other (See Comments)  ?  Stomach pain, gas, diarrhea, headache from Vascepa  ? Brompheniramine-Pseudoeph   ?  Feels drunk  ? Meprobamate   ? Metformin Nausea Only  ?  "flushes"  ? Methyldopa   ?  Other reaction(s): Unknown  ? Metoprolol   ?  Other reaction(s): Unknown  ? Procaine   ?  Passes out  ? Pseudoephedrine Hcl   ?  "feel drunk"  ? Tramadol   ?  "Made me go wonky, got fluid in my lungs"  ? ? ?Patient Measurements: ?Height: '5\' 2"'$  (157.5 cm) ?Weight: (!) 153.5 kg (338 lb 6.5 oz) ?IBW/kg (Calculated) : 50.1 ?Heparin Dosing Weight: 90 kg ? ?Vital Signs: ?Temp: 99 ?F (37.2 ?C) (03/21 1853) ?Temp Source: Oral (03/21 1853) ?BP: 112/55 (03/21 1853) ?Pulse Rate: 59 (03/21 1853) ? ?Labs: ?Recent Labs  ?  04/06/21 ?2027 04/06/21 ?2033 04/07/21 ?0006 04/07/21 ?0950 04/07/21 ?2233  ?HGB 7.9* 8.5*  --   --   --   ?HCT 27.6* 25.0*  --   --   --   ?PLT 249  --   --   --   --   ?LABPROT 16.0*  --   --   --   --   ?INR 1.3*  --   --   --   --   ?HEPARINUNFRC  --   --   --  0.10* 0.34  ?CREATININE 1.90* 1.90*  --   --   --   ?TROPONINIHS 41*  --  45*  --   --   ? ? ?Estimated Creatinine Clearance: 37.5 mL/min (A) (by C-G formula based on SCr of 1.9 mg/dL (H)). ? ?Assessment: ?76yo female presents to ED from SNF w/ hypotension, found to have AKI and new-onset Afib and initiated on IV Heparin per pharmacy dosing consult.  ?  ?Hgb 8.5, Platelets WNL. ? ?Heparin level returned on the low end of therapeutic at 0.34. ? ?Goal of Therapy:  ?Heparin level 0.3-0.7 units/ml ?Monitor platelets by anticoagulation protocol: Yes ?  ?Plan:  ?Increase heparin infusion to 1900 units/hr ?Check anti-Xa level in 8 hours ?Continue to monitor H&H and platelets ? ?Carma Lair, PharmD  Candidate [phone] ?04/07/2021,11:51 PM ? ? ?

## 2021-04-07 NOTE — Progress Notes (Signed)
ANTICOAGULATION CONSULT NOTE - Initial Consult ? ?Pharmacy Consult for heparin ?Indication: atrial fibrillation ? ?Allergies  ?Allergen Reactions  ? Metaxalone Anaphylaxis  ? Nitroglycerin   ?  BP bottoms out  ? Tramadol-Acetaminophen Anaphylaxis  ? Icosapent Ethyl Other (See Comments)  ?  Stomach pain, gas, diarrhea, headache from Vascepa  ? Brompheniramine-Pseudoeph   ?  Feels drunk  ? Meprobamate   ? Metformin Nausea Only  ?  "flushes"  ? Methyldopa   ?  Other reaction(s): Unknown  ? Metoprolol   ?  Other reaction(s): Unknown  ? Procaine   ?  Passes out  ? Pseudoephedrine Hcl   ?  "feel drunk"  ? Tramadol   ?  "Made me go wonky, got fluid in my lungs"  ? ? ?Patient Measurements: ?Height: '5\' 2"'$  (157.5 cm) ?Weight: (!) 153.5 kg (338 lb 6.5 oz) ?IBW/kg (Calculated) : 50.1 ?Heparin Dosing Weight: 90kg ? ?Vital Signs: ?Temp: 98.3 ?F (36.8 ?C) (03/20 1956) ?Temp Source: Oral (03/20 1956) ?BP: 84/71 (03/21 0000) ?Pulse Rate: 104 (03/21 0000) ? ?Labs: ?Recent Labs  ?  04/06/21 ?2027 04/06/21 ?2033  ?HGB 7.9* 8.5*  ?HCT 27.6* 25.0*  ?PLT 249  --   ?LABPROT 16.0*  --   ?INR 1.3*  --   ?CREATININE 1.90* 1.90*  ?TROPONINIHS 41*  --   ? ? ?Estimated Creatinine Clearance: 37.5 mL/min (A) (by C-G formula based on SCr of 1.9 mg/dL (H)). ? ? ?Medical History: ?Past Medical History:  ?Diagnosis Date  ? Coronary artery disease   ? Diabetes mellitus without complication ([place])   ? Hyperlipidemia   ? Hypertension   ? OSA (obstructive sleep apnea)   ? ? ?Assessment: ?76yo female presents to ED from SNF w/ hypotension, found to have AKI and new-onset Afib, to begin heparin. ? ?Goal of Therapy:  ?Heparin level 0.3-0.7 units/ml ?Monitor platelets by anticoagulation protocol: Yes ?  ?Plan:  ?Heparin 4000 units IV bolus x1 followed by infusion at 1500 units/hr and monitor heparin levels and CBC. ? ?Wynona Neat, PharmD, BCPS  ?04/07/2021,12:48 AM ? ? ?

## 2021-04-08 ENCOUNTER — Ambulatory Visit: Payer: Medicare Other | Admitting: Nurse Practitioner

## 2021-04-08 DIAGNOSIS — I10 Essential (primary) hypertension: Secondary | ICD-10-CM | POA: Diagnosis not present

## 2021-04-08 DIAGNOSIS — D6869 Other thrombophilia: Secondary | ICD-10-CM

## 2021-04-08 DIAGNOSIS — G894 Chronic pain syndrome: Secondary | ICD-10-CM | POA: Diagnosis not present

## 2021-04-08 DIAGNOSIS — I959 Hypotension, unspecified: Secondary | ICD-10-CM | POA: Diagnosis not present

## 2021-04-08 DIAGNOSIS — N179 Acute kidney failure, unspecified: Secondary | ICD-10-CM | POA: Diagnosis not present

## 2021-04-08 LAB — COMPREHENSIVE METABOLIC PANEL
ALT: 23 U/L (ref 0–44)
AST: 27 U/L (ref 15–41)
Albumin: 2.9 g/dL — ABNORMAL LOW (ref 3.5–5.0)
Alkaline Phosphatase: 40 U/L (ref 38–126)
Anion gap: 11 (ref 5–15)
BUN: 48 mg/dL — ABNORMAL HIGH (ref 8–23)
CO2: 29 mmol/L (ref 22–32)
Calcium: 8.8 mg/dL — ABNORMAL LOW (ref 8.9–10.3)
Chloride: 100 mmol/L (ref 98–111)
Creatinine, Ser: 1.45 mg/dL — ABNORMAL HIGH (ref 0.44–1.00)
GFR, Estimated: 38 mL/min — ABNORMAL LOW (ref >60)
Glucose, Bld: 263 mg/dL — ABNORMAL HIGH (ref 70–99)
Potassium: 3.8 mmol/L (ref 3.5–5.1)
Sodium: 140 mmol/L (ref 135–145)
Total Bilirubin: 0.3 mg/dL (ref 0.3–1.2)
Total Protein: 6.8 g/dL (ref 6.5–8.1)

## 2021-04-08 LAB — GLUCOSE, CAPILLARY
Glucose-Capillary: 203 mg/dL — ABNORMAL HIGH (ref 70–99)
Glucose-Capillary: 198 mg/dL — ABNORMAL HIGH (ref 70–99)
Glucose-Capillary: 212 mg/dL — ABNORMAL HIGH (ref 70–99)
Glucose-Capillary: 211 mg/dL — ABNORMAL HIGH (ref 70–99)

## 2021-04-08 LAB — URINE CULTURE: Culture: <10000 — AB

## 2021-04-08 LAB — MRSA NEXT GEN BY PCR, NASAL: MRSA by PCR Next Gen: POSITIVE — AB

## 2021-04-08 LAB — CBC
HCT: 27 % — ABNORMAL LOW (ref 36.0–46.0)
Hemoglobin: 8.1 g/dL — ABNORMAL LOW (ref 12.0–15.0)
MCH: 27.2 pg (ref 26.0–34.0)
MCHC: 30 g/dL (ref 30.0–36.0)
MCV: 90.6 fL (ref 80.0–100.0)
Platelets: 257 10*3/uL (ref 150–400)
RBC: 2.98 MIL/uL — ABNORMAL LOW (ref 3.87–5.11)
RDW: 17.8 % — ABNORMAL HIGH (ref 11.5–15.5)
WBC: 9.1 10*3/uL (ref 4.0–10.5)
nRBC: 0 % (ref 0.0–0.2)

## 2021-04-08 LAB — TSH: TSH: 3.802 u[IU]/mL (ref 0.350–4.500)

## 2021-04-08 LAB — HEPARIN LEVEL (UNFRACTIONATED): Heparin Unfractionated: 0.54 IU/mL (ref 0.30–0.70)

## 2021-04-08 LAB — CORTISOL: Cortisol, Plasma: 19.3 ug/dL

## 2021-04-08 LAB — MAGNESIUM: Magnesium: 1.9 mg/dL (ref 1.7–2.4)

## 2021-04-08 MED ORDER — APIXABAN 5 MG PO TABS
5.0000 mg | ORAL_TABLET | Freq: Two times a day (BID) | ORAL | Status: DC
  Administered 2021-04-08 – 2021-04-17 (×19): 5 mg via ORAL
  Filled 2021-04-08 (×19): qty 1

## 2021-04-08 MED ORDER — DILTIAZEM HCL 60 MG PO TABS: 30.0000 mg | ORAL_TABLET | Freq: Every day | ORAL | Status: DC | PRN

## 2021-04-08 NOTE — Progress Notes (Signed)
ANTICOAGULATION CONSULT NOTE - Follow Up Consult ? ?Pharmacy Consult for heparin ?Indication: atrial fibrillation ? ?Allergies  ?Allergen Reactions  ? Metaxalone Anaphylaxis  ? Nitroglycerin   ?  BP bottoms out  ? Tramadol-Acetaminophen Anaphylaxis  ? Icosapent Ethyl Other (See Comments)  ?  Stomach pain, gas, diarrhea, headache from Vascepa  ? Brompheniramine-Pseudoeph   ?  Feels drunk  ? Meprobamate   ? Metformin Nausea Only  ?  "flushes"  ? Methyldopa   ?  Other reaction(s): Unknown  ? Metoprolol   ?  Other reaction(s): Unknown  ? Procaine   ?  Passes out  ? Pseudoephedrine Hcl   ?  "feel drunk"  ? Tramadol   ?  "Made me go wonky, got fluid in my lungs"  ? ? ?Patient Measurements: ?Height: '5\' 2"'$  (157.5 cm) ?Weight: (!) 153.5 kg (338 lb 6.5 oz) ?IBW/kg (Calculated) : 50.1 ?Heparin Dosing Weight: 90 kg ? ?Vital Signs: ?Temp: 98.6 ?F (37 ?C) (03/22 0302) ?Temp Source: Oral (03/22 0302) ?BP: 91/43 (03/22 0302) ?Pulse Rate: 90 (03/22 0310) ? ?Labs: ?Recent Labs  ?  04/06/21 ?2027 04/06/21 ?2033 04/07/21 ?0006 04/07/21 ?0950 04/07/21 ?2233 04/08/21 ?0428  ?HGB 7.9* 8.5*  --   --   --  8.1*  ?HCT 27.6* 25.0*  --   --   --  27.0*  ?PLT 249  --   --   --   --  257  ?LABPROT 16.0*  --   --   --   --   --   ?INR 1.3*  --   --   --   --   --   ?HEPARINUNFRC  --   --   --  0.10* 0.34 0.54  ?CREATININE 1.90* 1.90*  --   --   --  1.45*  ?TROPONINIHS 41*  --  45*  --   --   --   ? ? ?Estimated Creatinine Clearance: 49.2 mL/min (A) (by C-G formula based on SCr of 1.45 mg/dL (H)). ? ?Assessment: ?76yo female presents to ED from SNF w/ hypotension, found to have AKI and new-onset Afib on IV Heparin per pharmacy dosing consult.  ?  ?H/H/Plt stable ?Heparin level 0.54, therapeutic (~4.5 hr after rate change) ?No s/sx of bleeding, per RN ? ?Goal of Therapy:  ?Heparin level 0.3-0.7 units/ml ?Monitor platelets by anticoagulation protocol: Yes ?  ?Plan:  ?Continue heparin infusion at 1900 units/hr ?Check anti-Xa level in 8  hours ?Continue to monitor H&H, platelets, and heparin level daily along with s/sx of bleeding. ? ?Luisa Hart, PharmD, BCPS ?Clinical Pharmacist ?04/08/2021 8:17 AM  ? ?Please refer to [facility] for pharmacy phone number  ? ? ?

## 2021-04-08 NOTE — Progress Notes (Signed)
ANTICOAGULATION CONSULT NOTE - Follow Up Consult ? ?Pharmacy Consult for heparin transition to apixaban ?Indication: atrial fibrillation ? ?Allergies  ?Allergen Reactions  ? Metaxalone Anaphylaxis  ? Nitroglycerin   ?  BP bottoms out  ? Tramadol-Acetaminophen Anaphylaxis  ? Icosapent Ethyl Other (See Comments)  ?  Stomach pain, gas, diarrhea, headache from Vascepa  ? Brompheniramine-Pseudoeph   ?  Feels drunk  ? Meprobamate   ? Metformin Nausea Only  ?  "flushes"  ? Methyldopa   ?  Other reaction(s): Unknown  ? Metoprolol   ?  Other reaction(s): Unknown  ? Procaine   ?  Passes out  ? Pseudoephedrine Hcl   ?  "feel drunk"  ? Tramadol   ?  "Made me go wonky, got fluid in my lungs"  ? ? ?Patient Measurements: ?Height: '5\' 2"'$  (157.5 cm) ?Weight: (!) 153.5 kg (338 lb 6.5 oz) ?IBW/kg (Calculated) : 50.1 ?Heparin Dosing Weight: 90 kg ? ?Vital Signs: ?Temp: 98.7 ?F (37.1 ?C) (03/22 7425) ?Temp Source: Oral (03/22 0302) ?BP: 135/63 (03/22 9563) ?Pulse Rate: 89 (03/22 0837) ? ?Labs: ?Recent Labs  ?  04/06/21 ?2027 04/06/21 ?2033 04/07/21 ?0006 04/07/21 ?0950 04/07/21 ?2233 04/08/21 ?0428  ?HGB 7.9* 8.5*  --   --   --  8.1*  ?HCT 27.6* 25.0*  --   --   --  27.0*  ?PLT 249  --   --   --   --  257  ?LABPROT 16.0*  --   --   --   --   --   ?INR 1.3*  --   --   --   --   --   ?HEPARINUNFRC  --   --   --  0.10* 0.34 0.54  ?CREATININE 1.90* 1.90*  --   --   --  1.45*  ?TROPONINIHS 41*  --  45*  --   --   --   ? ? ?Estimated Creatinine Clearance: 49.2 mL/min (A) (by C-G formula based on SCr of 1.45 mg/dL (H)). ? ?Assessment: ?76yo female presents to ED from SNF w/ hypotension, found to have AKI and new-onset. Pt initiated on heparin infusion for Afib on 3/21. Pharmacy has been consulted to dose apixaban. ?  ?H/H/Plt stable ?Heparin infusion 1900 units/hr - last level therapeutic ?No s/sx of bleeding, per RN ? ?Goal of Therapy:  ?Monitor platelets by anticoagulation protocol: Yes ?  ?Plan:  ?Discontinue heparin infusion at the time of  apixaban administration. ?Start apixaban '5mg'$  po BID - first dose ~12pm ?Continue to monitor daily: H&H, platelets and s/sx of bleeding. ? ?Luisa Hart, PharmD, BCPS ?Clinical Pharmacist ?04/08/2021 11:30 AM  ? ?Please refer to [facility] for pharmacy phone number  ?

## 2021-04-08 NOTE — Progress Notes (Signed)
? [name]  SJG:[id number] DOB: Jan 09, 1946 DOA: 04/06/2021 ?PCP: [name], NP   ? ?Brief Narrative:  ?76 year old with a history of morbid obesity, chronic hypoxic respiratory failure on 6 L nasal cannula at baseline, chronic diastolic CHF, chronic venous stasis, DM2, HTN, HLD, and chronic anemia who was admitted to [facility] 3/13 > 04/02/2021 for AMS and AKI felt to be due to polypharmacy and who was returned to the ED from her SNF due to hypotension (64/42).  ? ?There was initial concern in the ED she was septic due to chronic B LE wounds, but on careful physical exam there was no evidence of an actual wound infection/cellulitis. While in the ED she was noted to be in Afib w/ RVR. This is a new finding for her.  ? ?Consultants:  ?cards ? ?Code Status: NO CODE BLUE ? ?DVT prophylaxis: ?eliquis ? ?Interim Hx: ?Concerned about her lower extremity weeping ? ?Assessment & Plan: ? ?Newly diagnosed atrial fibrillation ?-Was not present last week during admission  ?-rate presently well controlled  ?-TSH normal ?-echo ?-change heparin to eliquis ?-cards consult at patient's request ? ?Hypotension ?-appears resolved ? ?Possible UTI ?Urinalysis at admission significantly abnormal -on empiric antibiotic, but will narrow spectrum ?-treat for 3 days ? ?Acute kidney injury ?Creatinine had improved to 1.48 at time of discharge 3/16 from a peak of 3.53 3/13 -not surprisingly with episodes of hypotension creatinine has increased again  ?-daily labs ? ?Hx of Pheochromocytoma s/p remote right adrenalectomy ?-cortisol normal ? ?Normocytic anemia of chronic disease ?Hemoglobin 8-8.5 during recent hospitalization -no evidence of acute blood loss -monitor trend in serial fashion ? ?Multiple open leg wounds - chronic venous stasis bilateral lower extremities ?Was cared for by [facility] during prior admission with no evidence of active infection -still no evidence of active infection this admission -hold any further antibiotic therapy and  continue wound care ?-unable to elevate extremity or tolerate UNNA boots ? ?Chronic pain syndrome ?Care with narcotics as patient was found to be high risk for medication induced obtundation during last admission -patient did not feel Lyrica was helping her neuropathic pain therefore it has been discontinued ?-does not tolerate Neurontin ? ?Chronic diastolic CHF ?Suspect lower extremity edema is primarily related to venous stasis/obesity -ability to diuresis greatly limited by hypotension and probable intravascular volume depletion ? ?DM2 ?-SSI ? ?OSA on CPAP ?-does not wear ? ?Morbid obesity -  ?-Estimated body mass index is 61.9 kg/m? as calculated from the following: ?  Height as of this encounter: '5\' 2"'$  (1.575 m). ?  Weight as of this encounter: 153.5 kg.  ? ? ?Family Communication: updated sister at bedside ?Disposition: from SNF - anticipate need to return to SNF when medically stable  ? ?Objective: ?Blood pressure 135/63, pulse 89, temperature 98.7 ?F (37.1 ?C), resp. rate 17, height '5\' 2"'$  (1.575 m), weight (!) 153.5 kg, SpO2 100 %. ? ?Intake/Output Summary (Last 24 hours) at 04/08/2021 1425 ?Last data filed at 04/08/2021 0204 ?Gross per 24 hour  ?Intake 571.01 ml  ?Output 200 ml  ?Net 371.01 ml  ? ?Filed Weights  ? 04/07/21 0000  ?Weight: (!) 153.5 kg  ? ? ?Examination: ?General: in chair NAD ?Lungs: Breath sounds distant but equal with no wheezing ?Cardiovascular: Heart sounds distant but regular without murmur or rub ?Abdomen: Morbidly obese, soft, bowel sounds positive, no appreciable mass but exam difficult ?Extremities: 3+ pitting bilateral lower extremity edema with wounds, weeping  ? ?CBC: ?Recent Labs  ?Lab 04/06/21 ?2027 04/06/21 ?2033  04/08/21 ?0428  ?WBC 10.7*  --  9.1  ?NEUTROABS 8.9*  --   --   ?HGB 7.9* 8.5* 8.1*  ?HCT 27.6* 25.0* 27.0*  ?MCV 94.2  --  90.6  ?PLT 249  --  257  ? ?Basic Metabolic Panel: ?Recent Labs  ?Lab 04/02/21 ?0151 04/06/21 ?2027 04/06/21 ?2033 04/08/21 ?0428  ?NA 136 139 140  140  ?K 4.6 4.1 4.0 3.8  ?CL 98 96* 100 100  ?CO2 27 29  --  29  ?GLUCOSE 221* 132* 133* 263*  ?BUN 72* 55* 49* 48*  ?CREATININE 1.48* 1.90* 1.90* 1.45*  ?CALCIUM 9.1 8.7*  --  8.8*  ?MG  --   --   --  1.9  ? ?GFR: ?Estimated Creatinine Clearance: 49.2 mL/min (A) (by C-G formula based on SCr of 1.45 mg/dL (H)). ? ?Liver Function Tests: ?Recent Labs  ?Lab 04/06/21 ?2027 04/08/21 ?0428  ?AST 19 27  ?ALT 20 23  ?ALKPHOS 37* 40  ?BILITOT 0.4 0.3  ?PROT 6.5 6.8  ?ALBUMIN 3.0* 2.9*  ? ? ?HbA1C: ?Hgb A1c MFr Bld  ?Date/Time Value Ref Range Status  ?04/07/2021 12:06 AM 6.3 (H) 4.8 - 5.6 % Final  ?  Comment:  ?  (NOTE) ?Pre diabetes:          5.7%-6.4% ? ?Diabetes:              >6.4% ? ?Glycemic control for   <7.0% ?adults with diabetes ?  ?02/01/2021 03:25 AM 7.2 (H) 4.8 - 5.6 % Final  ?  Comment:  ?  (NOTE) ?Pre diabetes:          5.7%-6.4% ? ?Diabetes:              >6.4% ? ?Glycemic control for   <7.0% ?adults with diabetes ?  ? ? ?CBG: ?Recent Labs  ?Lab 04/07/21 ?1111 04/07/21 ?1708 04/07/21 ?2211 04/08/21 ?0734 04/08/21 ?1218  ?GLUCAP 147* 145* 244* 203* 198*  ? ? ?Scheduled Meds: ? apixaban  5 mg Oral BID  ? aspirin EC  81 mg Oral Daily  ? ferrous sulfate  325 mg Oral Q breakfast  ? fluticasone furoate-vilanterol  1 puff Inhalation Daily  ? insulin aspart  0-20 Units Subcutaneous TID WC  ? insulin aspart  0-5 Units Subcutaneous QHS  ? lidocaine  1 patch Transdermal Daily  ? rosuvastatin  20 mg Oral QHS  ? senna-docusate  1 tablet Oral BID  ? umeclidinium bromide  1 puff Inhalation Daily  ? ?Continuous Infusions: ? cefTRIAXone (ROCEPHIN)  IV 1 g (04/08/21 0902)  ? ? ? LOS: 1 day  ? ?Eulogio Bear DO ?[facility] ? ? ?If 7PM-7AM, please contact night-coverage per Amion ?04/08/2021, 2:25 PM ? ? ? ?

## 2021-04-08 NOTE — Consult Note (Addendum)
?Cardiology Consultation:  ? ?Patient ID: [name] ?MRN: [id number]; DOB: 20-Oct-1945 ? ?Admit date: 04/06/2021 ?Date of Consult: 04/08/2021 ? ?PCP:  [name], NP ?  ?[facility] ?Cardiologist:  Evalina Field, MD  ? ?Patient Profile:  ? ?[name] is a 76 y.o. female with a history of CAD with known CTO of RCA , chronic diastolic CHF, chronic lower extremity edema with chronic wounds,  hypertension, hyperlipidemia, type 2 diabetes mellitus on insulin, pheochromocytoma s/p resection, COPD, chronic hypoxic respiratory failure following COVID-19 infection on home O2, obstructive sleep apnea on CPAP, CKD stage III, chronic pain syndrome, anemia of chronic disease, and morbid obesity with BMI >60 who is being seen 04/08/2021 for the evaluation of new onset atrial fibrillation at the request of Dr. Eliseo Squires. ? ?History of Present Illness:  ? ?[name] is a 76 year old female with the above history who is followed by Dr. Audie Box. Patient was referred to Dr. Audie Box in 07/2019 after moving here from [place], [place]. She has a history of CAD with MI in the 1990s s/p angioplasty. Most recent angiogram from 2015 showed CTO of RCA with left to right collateral flow. Echo in 08/2019 showed LVEF of 50-55% with hypokinesis of the mid to apical inferior and inferolateral myocardium and grade 2 diastolic dysfunction.  ? ?Patient was admitted in 01/2021 with acute on chronic diastolic CHF. Echo was very limited but showed  LVEF >75%. She was diuresed with IV Lasix and then restarted on home Torsemide. Patient also reported some chest pain on admission; however, this occurred when she was anxious and she has a history of episodic chest pain associated with emotional stress. High-sensitivity was mildly elevated and peaked at 81. Chest pain was not felt to be ACS and no ischemic evaluation was recommended. ? ?She was recently admitted from 03/30/2021 to 04/02/2021 for acute metabolic encephalopathy felt to likely be due  to polypharmacy as well as hypotension and AKI felt to be due poor oral intake. Home diuretics were held and she was treated with IV fluids on admission. Renal function improved to baseline with improvement in oral intake. High-sensitivity troponin was again minimally elevated and peaked at 41. This was felt to be due to demand ischemia in setting of hypotension. Home Amlodipine, Lisinopril, Metolazone, and Ranexa were stopped. Cardiology was not consulted during admission. ? ?Patient represented to the ED on 04/06/2021 via EMS for further evaluation of hypotension with BP of 62/42 at SNF. Upon arrival to the ED, she was started on empiric antibiotics and fluids for possible sepsis due to chronic bilateral lower extremity wounds. She was also found to be in new onset atrial fibrillation with RVR and was started on IV Heparin. Echo was ordered and showed severely dilated LV with EF of 60-65% and moderate asymmetric LVH of the basal-septal segment. Cardiology consulted for further evaluation. ? ?At the time of this evaluation, patient resting comfortably no acute distress.  She states that she started working with PT at the SNF after most recent discharge and was noted to have orthostatic hypotension with mild lightheadedness.  On the evening of presentation, he got up to use the restroom and urinated and then states she began to feel bad.  She describes feeling woozy.  She had a chest tech check her vitals and she was noted to be very hypotensive.  She denies any palpitations, chest pain, or syncope.  She has chronic shortness of breath due to COPD and prior COVID and pneumonia.  She  states she has been on 4 to 6 L of O2 at home since around this time last year after being hospitalized for pneumonia.  She reports occasional brief episodes of chest pain that occur with exertion and resolves with rest.  She also notes chest pain when she is very stressed or anxious.  She states she has been under a lot of stress  recently but denies any exertional chest pain recently because she has not been very active.  She reports sleeping in a recliner but mainly due to pain issues rather than orthopnea.  No PND.  She states she has had problems with lower extremity edema over the last few months started on a medication for pain but she cannot member what this is.  Denies any recent fevers.  No no significant nasal congestion or cough.  She has had some constipation recently but no other GI symptoms.  No abnormal bleeding in urine or stools. She reports feeling better now than when she came in. ? ?Past Medical History:  ?Diagnosis Date  ? Coronary artery disease   ? Diabetes mellitus without complication ([place])   ? Hyperlipidemia   ? Hypertension   ? OSA (obstructive sleep apnea)   ? ? ?Past Surgical History:  ?Procedure Laterality Date  ? CARDIAC CATHETERIZATION    ? pheochromocytoma    ? TONSILLECTOMY    ?  ? ? ? ?Inpatient Medications: ?Scheduled Meds: ? apixaban  5 mg Oral BID  ? aspirin EC  81 mg Oral Daily  ? ferrous sulfate  325 mg Oral Q breakfast  ? fluticasone furoate-vilanterol  1 puff Inhalation Daily  ? insulin aspart  0-20 Units Subcutaneous TID WC  ? insulin aspart  0-5 Units Subcutaneous QHS  ? lidocaine  1 patch Transdermal Daily  ? rosuvastatin  20 mg Oral QHS  ? senna-docusate  1 tablet Oral BID  ? umeclidinium bromide  1 puff Inhalation Daily  ? ?Continuous Infusions: ? cefTRIAXone (ROCEPHIN)  IV 1 g (04/08/21 0902)  ? ?PRN Meds: ?[DISCONTINUED] acetaminophen **OR** acetaminophen, diltiazem, HYDROcodone-acetaminophen, ondansetron **OR** ondansetron (ZOFRAN) IV ? ?Allergies:    ?Allergies  ?Allergen Reactions  ? Metaxalone Anaphylaxis  ? Nitroglycerin   ?  BP bottoms out  ? Tramadol-Acetaminophen Anaphylaxis  ? Icosapent Ethyl Other (See Comments)  ?  Stomach pain, gas, diarrhea, headache from Vascepa  ? Brompheniramine-Pseudoeph   ?  Feels drunk  ? Meprobamate   ? Metformin Nausea Only  ?  "flushes"  ? Methyldopa   ?   Other reaction(s): Unknown  ? Metoprolol   ?  Other reaction(s): Unknown  ? Procaine   ?  Passes out  ? Pseudoephedrine Hcl   ?  "feel drunk"  ? Tramadol   ?  "Made me go wonky, got fluid in my lungs"  ? ? ?Social History:   ?Social History  ? ?Socioeconomic History  ? Marital status: Widowed  ?  Spouse name: Not on file  ? Number of children: Not on file  ? Years of education: Not on file  ? Highest education level: Not on file  ?Occupational History  ? Occupation: retired  ?Tobacco Use  ? Smoking status: Former  ?  Packs/day: 2.00  ?  Years: 10.00  ?  Pack years: 20.00  ?  Types: Cigarettes  ?  Quit date: 08/15/1989  ?  Years since quitting: 31.6  ? Smokeless tobacco: Never  ?Substance and Sexual Activity  ? Alcohol use: Not Currently  ? Drug use:  Not Currently  ? Sexual activity: Not on file  ?Other Topics Concern  ? Not on file  ?Social History Narrative  ? Not on file  ? ?Social Determinants of Health  ? ?Financial Resource Strain: Not on file  ?Food Insecurity: Not on file  ?Transportation Needs: Not on file  ?Physical Activity: Not on file  ?Stress: Not on file  ?Social Connections: Not on file  ?Intimate Partner Violence: Not on file  ?  ?Family History:   ?Family History  ?Problem Relation Age of Onset  ? Heart attack Mother   ? Stroke Mother   ? Heart attack Father   ?  ? ?ROS:  ?Please see the history of present illness.  ?Review of Systems  ?Constitutional:  Negative for fever.  ?HENT:  Positive for congestion (occasional nasal congestion/drainage).   ?Respiratory:  Positive for shortness of breath.   ?Cardiovascular:  Positive for chest pain and leg swelling. Negative for palpitations and PND.  ?Gastrointestinal:  Positive for constipation. Negative for blood in stool, melena, nausea and vomiting.  ?Genitourinary:  Negative for hematuria.  ?Musculoskeletal:  Negative for myalgias.  ?Neurological:  Positive for dizziness. Negative for loss of consciousness.  ?Endo/Heme/Allergies:  Does not bruise/bleed  easily.  ?Psychiatric/Behavioral:  Substance abuse: remote smoking history.    ? ?Physical Exam/Data:  ? ?Vitals:  ? 04/08/21 0302 04/08/21 0310 04/08/21 0837 04/08/21 1114  ?BP: (!) 91/43  135/63   ?Pulse: (

## 2021-04-08 NOTE — Progress Notes (Signed)
Patient refused CPAP for the night. Patient cannot tolerate our mask.  Patient wears nasal pillows with home CPAP.  Patient will attempt to get someone to bring her home machine tomorrow. RT available if needed. ?

## 2021-04-09 DIAGNOSIS — N179 Acute kidney failure, unspecified: Secondary | ICD-10-CM | POA: Diagnosis not present

## 2021-04-09 DIAGNOSIS — I4819 Other persistent atrial fibrillation: Secondary | ICD-10-CM

## 2021-04-09 DIAGNOSIS — I10 Essential (primary) hypertension: Secondary | ICD-10-CM | POA: Diagnosis not present

## 2021-04-09 LAB — CBC
HCT: 27.4 % — ABNORMAL LOW (ref 36.0–46.0)
Hemoglobin: 8.2 g/dL — ABNORMAL LOW (ref 12.0–15.0)
MCH: 27.2 pg (ref 26.0–34.0)
MCHC: 29.9 g/dL — ABNORMAL LOW (ref 30.0–36.0)
MCV: 91 fL (ref 80.0–100.0)
Platelets: 250 10*3/uL (ref 150–400)
RBC: 3.01 MIL/uL — ABNORMAL LOW (ref 3.87–5.11)
RDW: 17.4 % — ABNORMAL HIGH (ref 11.5–15.5)
WBC: 9 10*3/uL (ref 4.0–10.5)
nRBC: 0 % (ref 0.0–0.2)

## 2021-04-09 LAB — GLUCOSE, CAPILLARY
Glucose-Capillary: 162 mg/dL — ABNORMAL HIGH (ref 70–99)
Glucose-Capillary: 251 mg/dL — ABNORMAL HIGH (ref 70–99)
Glucose-Capillary: 234 mg/dL — ABNORMAL HIGH (ref 70–99)
Glucose-Capillary: 215 mg/dL — ABNORMAL HIGH (ref 70–99)

## 2021-04-09 MED ORDER — MUPIROCIN 2 % EX OINT
1.0000 "application " | TOPICAL_OINTMENT | Freq: Two times a day (BID) | CUTANEOUS | Status: AC
  Administered 2021-04-09 – 2021-04-13 (×10): 1 via NASAL
  Filled 2021-04-09 (×2): qty 22

## 2021-04-09 MED ORDER — CHLORHEXIDINE GLUCONATE CLOTH 2 % EX PADS
6.0000 | MEDICATED_PAD | Freq: Every day | CUTANEOUS | Status: DC
  Administered 2021-04-10 – 2021-04-14 (×4): 6 via TOPICAL

## 2021-04-09 MED ORDER — FUROSEMIDE 10 MG/ML IJ SOLN
20.0000 mg | Freq: Once | INTRAMUSCULAR | Status: AC
  Administered 2021-04-09: 20 mg via INTRAVENOUS
  Filled 2021-04-09: qty 2

## 2021-04-09 NOTE — Progress Notes (Signed)
? ?Progress Note ? ?Patient Name: [name] ?Date of Encounter: 04/09/2021 ? ?[facility] Cardiologist: Evalina Field, MD   ? ?Subjective  ? ?76 year old female with super morbid obesity, obesity hypoventilation, obstructive sleep apnea, new onset atrial fibrillation, chronic diastolic congestive heart failure ? ?She is admitted with progressive shortness of breath and lightheadedness. ?She is currently living in a skilled nursing facility or equivalent. ? ?She has multiple open wounds that are draining clear serous fluid. ? ?For her atrial fibrillation our plan is to continue rate control with diltiazem and perhaps other medications and anticoagulation with Eliquis. ? ?Inpatient Medications  ?  ?Scheduled Meds: ? apixaban  5 mg Oral BID  ? ferrous sulfate  325 mg Oral Q breakfast  ? fluticasone furoate-vilanterol  1 puff Inhalation Daily  ? insulin aspart  0-20 Units Subcutaneous TID WC  ? insulin aspart  0-5 Units Subcutaneous QHS  ? lidocaine  1 patch Transdermal Daily  ? rosuvastatin  20 mg Oral QHS  ? senna-docusate  1 tablet Oral BID  ? umeclidinium bromide  1 puff Inhalation Daily  ? ?Continuous Infusions: ? ?PRN Meds: ?[DISCONTINUED] acetaminophen **OR** acetaminophen, diltiazem, HYDROcodone-acetaminophen, ondansetron **OR** ondansetron (ZOFRAN) IV  ? ?Vital Signs  ?  ?Vitals:  ? 04/08/21 1654 04/08/21 2120 04/09/21 0631 04/09/21 1002  ?BP: 122/80 114/78 115/89 118/80  ?Pulse: 99 88 90 88  ?Resp: '18 18 18 18  '$ ?Temp: (!) 97.5 ?F (36.4 ?C) 98.3 ?F (36.8 ?C) 98 ?F (36.7 ?C) 98.2 ?F (36.8 ?C)  ?TempSrc: Oral  Oral Oral  ?SpO2: 100% 100% 100% 100%  ?Weight:      ?Height:      ? ? ?Intake/Output Summary (Last 24 hours) at 04/09/2021 1112 ?Last data filed at 04/09/2021 0900 ?Gross per 24 hour  ?Intake 1300 ml  ?Output 250 ml  ?Net 1050 ml  ? ? ?  04/07/2021  ? 12:00 AM 04/02/2021  ?  4:18 AM 04/01/2021  ?  6:35 AM  ?Last 3 Weights  ?Weight (lbs) 338 lb 6.5 oz 338 lb 6.5 oz 339 lb 11.7 oz  ?Weight (kg) 153.5 kg  153.5 kg 154.1 kg  ?   ? ?Telemetry  ?  ?She is currently off telemetry- Personally Reviewed ? ?ECG  ?  ?Atrial fibrillation with a rate of 99.  No ST or T wave changes.- Personally Reviewed ? ?Physical Exam  ?Morbidly obese female, sitting in the chair. ?GEN: No acute distress.   ?Neck: No JVD ?Cardiac: Irregularly irregular. ?Respiratory: Distant breath sounds. ?GI: Soft, nontender, non-distended  ?MS: 3-4+ pitting edema with weeping of her legs. ?Neuro:  Nonfocal  ?Psych: Normal affect  ? ?Labs  ?  ?High Sensitivity Troponin:   ?Recent Labs  ?Lab 03/30/21 ?2049 03/30/21 ?2250 04/06/21 ?2027 04/07/21 ?0006  ?TROPONINIHS 21* 22* 41* 45*  ?   ?Chemistry ?Recent Labs  ?Lab 04/06/21 ?2027 04/06/21 ?2033 04/08/21 ?0428  ?NA 139 140 140  ?K 4.1 4.0 3.8  ?CL 96* 100 100  ?CO2 29  --  29  ?GLUCOSE 132* 133* 263*  ?BUN 55* 49* 48*  ?CREATININE 1.90* 1.90* 1.45*  ?CALCIUM 8.7*  --  8.8*  ?MG  --   --  1.9  ?PROT 6.5  --  6.8  ?ALBUMIN 3.0*  --  2.9*  ?AST 19  --  27  ?ALT 20  --  23  ?ALKPHOS 37*  --  40  ?BILITOT 0.4  --  0.3  ?GFRNONAA 27*  --  38*  ?  ANIONGAP 14  --  11  ?  ?Lipids No results for input(s): CHOL, TRIG, HDL, LABVLDL, LDLCALC, CHOLHDL in the last 168 hours.  ?Hematology ?Recent Labs  ?Lab 04/06/21 ?2027 04/06/21 ?2033 04/08/21 ?0428 04/09/21 ?0410  ?WBC 10.7*  --  9.1 9.0  ?RBC 2.93*  --  2.98* 3.01*  ?HGB 7.9* 8.5* 8.1* 8.2*  ?HCT 27.6* 25.0* 27.0* 27.4*  ?MCV 94.2  --  90.6 91.0  ?MCH 27.0  --  27.2 27.2  ?MCHC 28.6*  --  30.0 29.9*  ?RDW 17.7*  --  17.8* 17.4*  ?PLT 249  --  257 250  ? ?Thyroid  ?Recent Labs  ?Lab 04/08/21 ?0428  ?TSH 3.802  ?  ?BNP ?Recent Labs  ?Lab 04/06/21 ?2027  ?BNP 180.2*  ?  ?DDimer No results for input(s): DDIMER in the last 168 hours.  ? ?Radiology  ?  ?ECHOCARDIOGRAM COMPLETE ? ?Result Date: 04/07/2021 ?   ECHOCARDIOGRAM REPORT   Patient Name:   [name] Date of Exam: 04/07/2021 Medical Rec #:  [id number]     Height:       62.0 in Accession #:    [id number]    Weight:        338.4 lb Date of Birth:  [birth year]     BSA:          2.391 m? Patient Age:    89 years      BP:           87/54 mmHg Patient Gender: F             HR:           90 bpm. Exam Location:  Inpatient Procedure: 2D Echo, Cardiac Doppler, Color Doppler and Intracardiac            Opacification Agent Indications:    Afib  History:        Patient has prior history of Echocardiogram examinations. CHF,                 CAD, TIA; Risk Factors:Hypertension, Diabetes and Sleep Apnea.  Sonographer:    Jyl Heinz Referring Phys: [phone] JARED M GARDNER  Sonographer Comments: Image acquisition challenging due to patient body habitus. IMPRESSIONS  1. Left ventricular ejection fraction, by estimation, is 60 to 65%. The left ventricle has normal function. Left ventricular endocardial border not optimally defined to evaluate regional wall motion. The left ventricular internal cavity size was severely dilated. There is moderate asymmetric left ventricular hypertrophy of the basal-septal segment. Left ventricular diastolic parameters are indeterminate.  2. Right ventricular systolic function was not well visualized. The right ventricular size is not well visualized. Tricuspid regurgitation signal is inadequate for assessing PA pressure.  3. The mitral valve is grossly normal. No evidence of mitral valve regurgitation.  4. The aortic valve was not well visualized. Aortic valve regurgitation is not visualized.  5. The inferior vena cava is dilated in size with >50% respiratory variability, suggesting right atrial pressure of 8 mmHg.  6. Technically difficult study. Comparison(s): No significant change from prior study. Still a very limited study due to image quality. FINDINGS  Left Ventricle: Left ventricular ejection fraction, by estimation, is 60 to 65%. The left ventricle has normal function. Left ventricular endocardial border not optimally defined to evaluate regional wall motion. Definity contrast agent was given IV to delineate the  left ventricular endocardial borders. The left ventricular internal cavity size was severely dilated. There is moderate asymmetric left ventricular hypertrophy  of the basal-septal segment. Left ventricular diastolic parameters are indeterminate. Right Ventricle: The right ventricular size is not well visualized. Right vetricular wall thickness was not well visualized. Right ventricular systolic function was not well visualized. Tricuspid regurgitation signal is inadequate for assessing PA pressure. Left Atrium: Left atrial size was not well visualized. Right Atrium: Right atrial size was not well visualized. Pericardium: Trivial pericardial effusion is present. Mitral Valve: The mitral valve is grossly normal. No evidence of mitral valve regurgitation. Tricuspid Valve: The tricuspid valve is grossly normal. Tricuspid valve regurgitation is not demonstrated. No evidence of tricuspid stenosis. Aortic Valve: The aortic valve was not well visualized. Aortic valve regurgitation is not visualized. Aortic valve peak gradient measures 12.5 mmHg. Pulmonic Valve: The pulmonic valve was not well visualized. Pulmonic valve regurgitation is not visualized. Aorta: The ascending aorta was not well visualized. Venous: The inferior vena cava is dilated in size with greater than 50% respiratory variability, suggesting right atrial pressure of 8 mmHg. IAS/Shunts: No atrial level shunt detected by color flow Doppler.  LEFT VENTRICLE PLAX 2D LVIDd:         6.10 cm      Diastology LVIDs:         4.10 cm      LV e' medial:    3.59 cm/s LV PW:         1.10 cm      LV E/e' medial:  28.4 LV IVS:        1.40 cm      LV e' lateral:   7.72 cm/s LVOT diam:     2.00 cm      LV E/e' lateral: 13.2 LV SV:         63 LV SV Index:   26 LVOT Area:     3.14 cm?  LV Volumes (MOD) LV vol d, MOD A2C: 156.0 ml LV vol d, MOD A4C: 151.0 ml LV vol s, MOD A2C: 78.0 ml LV vol s, MOD A4C: 82.2 ml LV SV MOD A2C:     78.0 ml LV SV MOD A4C:     151.0 ml LV SV MOD  BP:      73.5 ml RIGHT VENTRICLE            IVC RV S prime:     8.92 cm/s  IVC diam: 2.80 cm LEFT ATRIUM             Index LA diam:        4.40 cm 1.84 cm/m? LA Vol (A2C):   73.2 ml 30.62 ml/m? LA Vol (A4C)

## 2021-04-09 NOTE — TOC Progression Note (Signed)
Transition of Care (TOC) - Initial/Assessment Note  ? ? ?Patient Details  ?Name: [name] ?MRN: [id number] ?Date of Birth: [birth year] ? ?Transition of Care (TOC) CM/SW Contact:    ?Paulene Floor [place] Hert, LCSWA ?Phone Number: ?04/09/2021, 8:20 AM ? ?Clinical Narrative:                 ?CSW spoke with Claiborne Billings with admissions at [facility] and confirmed that the patient is a resident at their facility.  CSW will complete FL2 and fax to [facility]. ? ? ?Patient Goals and CMS Choice ?  ?  ?  ? ?Expected Discharge Plan and Services ?  ?  ?  ?  ?  ?                ?  ?  ?  ?  ?  ?  ?  ?  ?  ?  ? ?Prior Living Arrangements/Services ?  ?  ?  ?       ?  ?  ?  ?  ? ?Activities of Daily Living ?Home Assistive Devices/Equipment: Transport planner, CPAP, Eyeglasses, Hearing aid ?ADL Screening (condition at time of admission) ?Patient's cognitive ability adequate to safely complete daily activities?: Yes ?Is the patient deaf or have difficulty hearing?: Yes ?Does the patient have difficulty seeing, even when wearing glasses/contacts?: Yes ?Does the patient have difficulty concentrating, remembering, or making decisions?: No ?Patient able to express need for assistance with ADLs?: Yes ?Does the patient have difficulty dressing or bathing?: Yes ?Independently performs ADLs?: No ?Communication: Independent ?Dressing (OT): Needs assistance ?Is this a change from baseline?: Pre-admission baseline ?Grooming: Needs assistance ?Is this a change from baseline?: Pre-admission baseline ?Feeding: Independent ?Bathing: Needs assistance ?Is this a change from baseline?: Pre-admission baseline ?Toileting: Needs assistance ?Is this a change from baseline?: Pre-admission baseline ?In/Out Bed: Needs assistance ?Is this a change from baseline?: Pre-admission baseline ?Walks in Home: Needs assistance ?Is this a change from baseline?: Pre-admission baseline ?Does the patient have difficulty walking or climbing stairs?: Yes ?Weakness of Legs:  Both ?Weakness of Arms/Hands: Both ? ?Permission Sought/Granted ?  ?  ?   ?   ?   ?   ? ?Emotional Assessment ?  ?  ?  ?  ?  ?  ? ?Admission diagnosis:  AKI (acute kidney injury) ([place]) [N17.9] ?Hypotension, unspecified hypotension type [I95.9] ?Hypotension [I95.9] ?Patient Active Problem List  ? Diagnosis Date Noted  ? Chronic pain syndrome 04/06/2021  ? New onset atrial fibrillation ([place]) 04/06/2021  ? Multiple open wounds of lower leg 04/06/2021  ? Acute encephalopathy 03/31/2021  ? Hypoglycemia 03/31/2021  ? Hypotension 03/31/2021  ? Elevated troponin 03/31/2021  ? Acute on chronic anemia 03/31/2021  ? Chronic diastolic CHF (congestive heart failure) ([place])   ? AKI (acute kidney injury) ([place]) 03/30/2021  ? Leg wound, left 02/01/2021  ? SIRS (systemic inflammatory response syndrome) ([place]) 05/03/2020  ? DM2 (diabetes mellitus, type 2) ([place]) 05/03/2020  ? Chronic respiratory failure with hypoxia and hypercapnia ([place]) 05/03/2020  ? Cervical disc prolapse with radiculopathy 12/07/2019  ? History of pheochromocytoma 12/07/2019  ? 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 OSA on CPAP 12/07/2019  ? 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 Gait abnormality 06/19/2019  ? 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 TIA (transient ischemic attack) 03/22/2017  ? Acute on chronic diastolic CHF (congestive heart failure) ([place]) 03/09/2017  ? Diabetic hypoglycemia ([place]) 03/05/2014  ? 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 Dizzy 12/28/2011  ?  CAD (coronary artery disease), native coronary artery 12/05/2010  ? Morbid obesity ([place]) 12/05/2010  ? Essential hypertension 12/03/2010  ? Hyperlipidemia 12/03/2010  ? UI (urinary incontinence) 12/03/2010  ? Chest pain 05/31/2006  ? ?PCP:  [name], NP ?Pharmacy:   ?[place] [place] ?[place] ?[place] [address] ?Phone: [phone] Fax: [phone] ? ? ? ? ?Social Determinants  of Health (SDOH) Interventions ?  ? ?Readmission Risk Interventions ?   ? View : No data to display.  ?  ?  ?  ? ? ? ?

## 2021-04-09 NOTE — Progress Notes (Signed)
? Chaz Ronning  NID:[id number] DOB: 28-Jan-1945 DOA: 04/06/2021 ?PCP: [name], NP   ? ?Brief Narrative:  ?76 year old with a history of morbid obesity, chronic hypoxic respiratory failure on 6 L nasal cannula at baseline, chronic diastolic CHF, chronic venous stasis, DM2, HTN, HLD, and chronic anemia who was admitted to [facility] 3/13 > 04/02/2021 for AMS and AKI felt to be due to polypharmacy and who was returned to the ED from her SNF due to hypotension (64/42).  ? ?There was initial concern in the ED she was septic due to chronic B LE wounds, but on careful physical exam there was no evidence of an actual wound infection/cellulitis. While in the ED she was noted to be in Afib w/ RVR. This is a new finding for her.  ? ?Consultants:  ?cards ? ?Code Status: NO CODE BLUE ? ?DVT prophylaxis: ?eliquis ? ?Interim Hx: ?Urinating on her wounds ? ?Assessment & Plan: ? ?Newly diagnosed atrial fibrillation ?-Was not present last week during admission  ?-rate presently well controlled  ?-TSH normal ?-echo with preserved EF ?-change heparin to eliquis ?-cards consult appreciated  ? ?Hypotension ?-appears resolved ?-will attempt diuresis ? ?Possible UTI ?Urinalysis at admission significantly abnormal -on empiric antibiotic, but will narrow spectrum ?-treated for 3 days ? ?Acute kidney injury ?Creatinine had improved to 1.48 at time of discharge 3/16 from a peak of 3.53 3/13 -not surprisingly with episodes of hypotension creatinine has increased again  ?-daily labs ?-watch closely while getting diuresis ? ?Hx of Pheochromocytoma s/p remote right adrenalectomy ?-cortisol normal ? ?Normocytic anemia of chronic disease ?Hemoglobin 8-8.5 during recent hospitalization -no evidence of acute blood loss -monitor trend in serial fashion ? ?Multiple open leg wounds - chronic venous stasis bilateral lower extremities ?Was cared for by [facility] during prior admission with no evidence of active infection -still no evidence of active infection  this admission -hold any further antibiotic therapy and continue wound care ?-unable to elevate extremity or tolerate UNNA boots which limits her healing ? ?Chronic pain syndrome ?Care with narcotics as patient was found to be high risk for medication induced obtundation during last admission -patient did not feel Lyrica was helping her neuropathic pain therefore it has been discontinued ?-does not tolerate Neurontin ? ?Chronic diastolic CHF ?Suspect lower extremity edema is primarily related to venous stasis/obesity/hypoalbuminemia -ability to diuresis greatly limited by hypotension and probable intravascular volume depletion- will do IV lasix x 1 ? ?DM2 ?-SSI ? ?OSA on CPAP ?-does not wear ? ?Morbid obesity -  ?-Estimated body mass index is 61.9 kg/m? as calculated from the following: ?  Height as of this encounter: '5\' 2"'$  (1.575 m). ?  Weight as of this encounter: 153.5 kg.  ? ? ?Family Communication: updated sister at bedside- will call son ?Disposition: from SNF - anticipate need to return to SNF when medically stable  ? ?Objective: ?Blood pressure 118/80, pulse 88, temperature 98.2 ?F (36.8 ?C), temperature source Oral, resp. rate 18, height '5\' 2"'$  (1.575 m), weight (!) 153.5 kg, SpO2 100 %. ? ?Intake/Output Summary (Last 24 hours) at 04/09/2021 1147 ?Last data filed at 04/09/2021 0900 ?Gross per 24 hour  ?Intake 1300 ml  ?Output 250 ml  ?Net 1050 ml  ? ?Filed Weights  ? 04/07/21 0000  ?Weight: (!) 153.5 kg  ? ? ?Examination: ? ?General: Appearance:    Severely obese female in no acute distress  ?   ?Lungs:      respirations unlabored  ?Heart:    Normal heart rate.   ?  MS:   All extremities are intact. + edema ?  ?Neurologic:   Awake, alert- poor insight into disease process  ?  ? ?CBC: ?Recent Labs  ?Lab 04/06/21 ?2027 04/06/21 ?2033 04/08/21 ?0428 04/09/21 ?0410  ?WBC 10.7*  --  9.1 9.0  ?NEUTROABS 8.9*  --   --   --   ?HGB 7.9* 8.5* 8.1* 8.2*  ?HCT 27.6* 25.0* 27.0* 27.4*  ?MCV 94.2  --  90.6 91.0  ?PLT 249  --   257 250  ? ?Basic Metabolic Panel: ?Recent Labs  ?Lab 04/06/21 ?2027 04/06/21 ?2033 04/08/21 ?0428  ?NA 139 140 140  ?K 4.1 4.0 3.8  ?CL 96* 100 100  ?CO2 29  --  29  ?GLUCOSE 132* 133* 263*  ?BUN 55* 49* 48*  ?CREATININE 1.90* 1.90* 1.45*  ?CALCIUM 8.7*  --  8.8*  ?MG  --   --  1.9  ? ?GFR: ?Estimated Creatinine Clearance: 49.2 mL/min (A) (by C-G formula based on SCr of 1.45 mg/dL (H)). ? ?Liver Function Tests: ?Recent Labs  ?Lab 04/06/21 ?2027 04/08/21 ?0428  ?AST 19 27  ?ALT 20 23  ?ALKPHOS 37* 40  ?BILITOT 0.4 0.3  ?PROT 6.5 6.8  ?ALBUMIN 3.0* 2.9*  ? ? ?HbA1C: ?Hgb A1c MFr Bld  ?Date/Time Value Ref Range Status  ?04/07/2021 12:06 AM 6.3 (H) 4.8 - 5.6 % Final  ?  Comment:  ?  (NOTE) ?Pre diabetes:          5.7%-6.4% ? ?Diabetes:              >6.4% ? ?Glycemic control for   <7.0% ?adults with diabetes ?  ?02/01/2021 03:25 AM 7.2 (H) 4.8 - 5.6 % Final  ?  Comment:  ?  (NOTE) ?Pre diabetes:          5.7%-6.4% ? ?Diabetes:              >6.4% ? ?Glycemic control for   <7.0% ?adults with diabetes ?  ? ? ?CBG: ?Recent Labs  ?Lab 04/08/21 ?0734 04/08/21 ?1218 04/08/21 ?1653 04/08/21 ?2122 04/09/21 ?0732  ?GLUCAP 203* 198* 212* 211* 162*  ? ? ?Scheduled Meds: ? apixaban  5 mg Oral BID  ? ferrous sulfate  325 mg Oral Q breakfast  ? fluticasone furoate-vilanterol  1 puff Inhalation Daily  ? furosemide  20 mg Intravenous Once  ? insulin aspart  0-20 Units Subcutaneous TID WC  ? insulin aspart  0-5 Units Subcutaneous QHS  ? lidocaine  1 patch Transdermal Daily  ? rosuvastatin  20 mg Oral QHS  ? senna-docusate  1 tablet Oral BID  ? umeclidinium bromide  1 puff Inhalation Daily  ? ?Continuous Infusions: ? ? ? ? LOS: 2 days  ? ?Eulogio Bear DO ?[facility] ? ? ?If 7PM-7AM, please contact night-coverage per Amion ?04/09/2021, 11:47 AM ? ? ? ?

## 2021-04-09 NOTE — [place] FL2 (Signed)
?[place] MEDICAID FL2 LEVEL OF CARE SCREENING TOOL  ?  ? ?IDENTIFICATION  ?Patient Name: ?[name] Birthdate: 1945-10-06 Sex: female Admission Date (Current Location): ?04/06/2021  ?[place] and [place] Number: ? [place] ?  Facility and Address:  ?[facility], [place] [address], [place], [place] [address] ?     Provider Number: ?[id number]  ?Attending Physician Name and Address:  ?Geradine Girt, DO ? Relative Name and Phone Number:  ?Donnielle, Addison)   [phone] ?   ?Current Level of Care: ?Hospital Recommended Level of Care: ?[place] Prior Approval Number: ?  ? ?Date Approved/Denied: ?  PASRR Number: ?[id number] A ? ?Discharge Plan: ?  ?  ? ?Current Diagnoses: ?Patient Active Problem List  ? Diagnosis Date Noted  ? Chronic pain syndrome 04/06/2021  ? New onset atrial fibrillation ([place]) 04/06/2021  ? Multiple open wounds of lower leg 04/06/2021  ? Acute encephalopathy 03/31/2021  ? Hypoglycemia 03/31/2021  ? Hypotension 03/31/2021  ? Elevated troponin 03/31/2021  ? Acute on chronic anemia 03/31/2021  ? Chronic diastolic CHF (congestive heart failure) ([place])   ? AKI (acute kidney injury) ([place]) 03/30/2021  ? Leg wound, left 02/01/2021  ? SIRS (systemic inflammatory response syndrome) ([place]) 05/03/2020  ? DM2 (diabetes mellitus, type 2) ([employer] [place]) 05/03/2020  ? Chronic respiratory failure with hypoxia and hypercapnia ([place]) 05/03/2020  ? Cervical disc prolapse with radiculopathy 12/07/2019  ? History of pheochromocytoma 12/07/2019  ? 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 OSA on CPAP 12/07/2019  ? 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 Gait abnormality 06/19/2019  ? Psoriasis with arthropathy ([place]) 04/06/2017  ? TIA (transient ischemic attack) 03/22/2017  ? Acute on chronic diastolic CHF (congestive heart failure) ([place]) 03/09/2017  ? Diabetic hypoglycemia ([place]) 03/05/2014  ? Diabetic neuropathic arthropathy ([place]) 03/05/2014  ? Dizzy 12/28/2011  ? CAD  (coronary artery disease), native coronary artery 12/05/2010  ? Morbid obesity ([place]) 12/05/2010  ? Essential hypertension 12/03/2010  ? Hyperlipidemia 12/03/2010  ? UI (urinary incontinence) 12/03/2010  ? Chest pain 05/31/2006  ? ? ?Orientation RESPIRATION BLADDER Height & Weight   ?  ?Situation, Time, Self, Place ? Normal Incontinent Weight: (!) 338 lb 6.5 oz (153.5 kg) ?Height:  '5\' 2"'$  (157.5 cm)  ?BEHAVIORAL SYMPTOMS/MOOD NEUROLOGICAL BOWEL NUTRITION STATUS  ?    Continent Diet (See d/c summary)  ?AMBULATORY STATUS COMMUNICATION OF NEEDS Skin   ?Extensive Assist Verbally Skin abrasions (skin damage R breast) ?  ?  ?  ?    ?     ?     ? ? ?Personal Care Assistance Level of Assistance  ?Bathing, Feeding, Dressing Bathing Assistance: Limited assistance ?Feeding assistance: Limited assistance ?Dressing Assistance: Limited assistance ?   ? ?Functional Limitations Info  ?Hearing, Speech, Sight Sight Info: Adequate ?Hearing Info: Adequate ?Speech Info: Adequate  ? ? ?SPECIAL CARE FACTORS FREQUENCY  ?    ?  ?  ?  ?  ?  ?  ?   ? ? ?Contractures Contractures Info: Not present  ? ? ?Additional Factors Info  ?Code Status, Insulin Sliding Scale, Allergies, Isolation Precautions Code Status Info: DNR ?Allergies Info: Metaxalone   Nitroglycerin   Tramadol-acetaminophen   Icosapent Ethyl   Brompheniramine-pseudoeph   Meprobamate   Metformin   Methyldopa   Metoprolol   Procaine   Pseudoephedrine Hcl   Tramadol ?  ?Insulin Sliding Scale Info: see d/c med list ?Isolation Precautions Info: MRSA ?   ? ?Current Medications (04/09/2021):  This is the  current hospital active medication list ?Current Facility-Administered Medications  ?Medication Dose Route Frequency Provider Last Rate Last Admin  ? acetaminophen (TYLENOL) suppository 650 mg  650 mg Rectal Q6H PRN Etta Quill, DO      ? apixaban Arne Cleveland) tablet 5 mg  5 mg Oral BID Kaleen Mask, RPH   5 mg at 04/08/21 2142  ? cefTRIAXone (ROCEPHIN) 1 g in sodium chloride 0.9 % 100  mL IVPB  1 g Intravenous Q24H Cherene Altes, MD 200 mL/hr at 04/08/21 0902 1 g at 04/08/21 0902  ? diltiazem (CARDIZEM) tablet 30 mg  30 mg Oral Daily PRN Eulogio Bear U, DO      ? ferrous sulfate tablet 325 mg  325 mg Oral Q breakfast Jennette Kettle M, DO      ? fluticasone furoate-vilanterol (BREO ELLIPTA) 200-25 MCG/ACT 1 puff  1 puff Inhalation Daily Etta Quill, DO   1 puff at 04/08/21 1113  ? HYDROcodone-acetaminophen (NORCO/VICODIN) 5-325 MG per tablet 1 tablet  1 tablet Oral Q6H PRN Etta Quill, DO   1 tablet at 04/09/21 0222  ? insulin aspart (novoLOG) injection 0-20 Units  0-20 Units Subcutaneous TID WC Etta Quill, DO   7 Units at 04/08/21 1734  ? insulin aspart (novoLOG) injection 0-5 Units  0-5 Units Subcutaneous QHS Etta Quill, DO   2 Units at 04/08/21 2143  ? lidocaine (LIDODERM) 5 % 1 patch  1 patch Transdermal Daily Etta Quill, DO   1 patch at 04/08/21 3149  ? ondansetron (ZOFRAN) tablet 4 mg  4 mg Oral Q6H PRN Etta Quill, DO      ? Or  ? ondansetron (ZOFRAN) injection 4 mg  4 mg Intravenous Q6H PRN Etta Quill, DO      ? rosuvastatin (CRESTOR) tablet 20 mg  20 mg Oral QHS Jennette Kettle M, DO   20 mg at 04/08/21 2142  ? senna-docusate (Senokot-S) tablet 1 tablet  1 tablet Oral BID Etta Quill, DO   1 tablet at 04/08/21 2142  ? umeclidinium bromide (INCRUSE ELLIPTA) 62.5 MCG/ACT 1 puff  1 puff Inhalation Daily Sloan Leiter B, RPH   1 puff at 04/08/21 1113  ? ? ? ?Discharge Medications: ?Please see discharge summary for a list of discharge medications. ? ?Relevant Imaging Results: ? ?Relevant Lab Results: ? ? ?Additional Information ?SSN [id number] ? ?Paulene Floor Tamrah Victorino, LCSWA ? ? ? ? ?

## 2021-04-09 NOTE — Evaluation (Signed)
Physical Therapy Evaluation ?Patient Details ?Name: [name] ?MRN: [id number] ?DOB: 1945/03/29 ?Today's Date: 04/09/2021 ? ?History of Present Illness ? 76 year old admitted 3/20 to the ED from her SNF due to hypotension (64/42).  Recently admitted to [facility] 3/13 > 04/02/2021 for AMS and AKI felt to be due to polypharmacy. PMH:  morbid obesity, chronic hypoxic respiratory failure on 6 L nasal cannula at baseline, chronic diastolic CHF, chronic venous stasis, DM2, HTN, HLD, and chronic anemia  ?Clinical Impression ? Pt admitted with above diagnosis. Pt was able to stand and take pivotal steps to the 3n1 and then back to recliner with need for UE support and min guard assist. Needs continued therapy and plan is to return to SNF for therapy and then transition to A living.  Pt currently with functional limitations due to the deficits listed below (see PT Problem List). Pt will benefit from skilled PT to increase their independence and safety with mobility to allow discharge to the venue listed below.      ?   ? ?Recommendations for follow up therapy are one component of a multi-disciplinary discharge planning process, led by the attending physician.  Recommendations may be updated based on patient status, additional functional criteria and insurance authorization. ? ?Follow Up Recommendations Skilled nursing-short term rehab (<3 hours/day) ? ?  ?Assistance Recommended at Discharge Frequent or constant Supervision/Assistance  ?Patient can return home with the following ? A little help with walking and/or transfers;A little help with bathing/dressing/bathroom ? ?  ?Equipment Recommendations None recommended by PT  ?Recommendations for Other Services ?    ?  ?Functional Status Assessment Patient has had a recent decline in their functional status and demonstrates the ability to make significant improvements in function in a reasonable and predictable amount of time.  ? ?  ?Precautions / Restrictions  Precautions ?Precautions: Fall;Other (comment) ?Precaution Comments: LLE wound, 02, watch BP ?Restrictions ?Weight Bearing Restrictions: No  ? ?  ? ?Mobility ? Bed Mobility ?  ?  ?  ?  ?  ?  ?  ?General bed mobility comments: In recliner upon arrival ?  ? ?Transfers ?Overall transfer level: Needs assistance ?Equipment used: 2 person hand held assist ?Transfers: Sit to/from Stand, Bed to chair/wheelchair/BSC ?Sit to Stand: Min assist, Min guard ?  ?Step pivot transfers: Min assist, Min guard ?  ?  ?  ?General transfer comment: Min A to power to standing from recliner x1, step pivot transfer to 3N1 holding onto the bed rail  with Min guard A for balance and steadying support.  Pivot steps back to chair with min guard assist and use of rail again.  Declined a walk today due to fatigue from transfer. Needed total assist to clean herself. ?  ? ?Ambulation/Gait ?  ?  ?  ?  ?  ?  ?  ?General Gait Details: Deferred ? ?Stairs ?  ?  ?  ?  ?  ? ?Wheelchair Mobility ?  ? ?Modified Rankin (Stroke Patients Only) ?  ? ?  ? ?Balance Overall balance assessment: Needs assistance ?Sitting-balance support: No upper extremity supported, Feet supported ?Sitting balance-Leahy Scale: Fair ?  ?  ?Standing balance support: During functional activity, Bilateral upper extremity supported ?Standing balance-Leahy Scale: Poor ?Standing balance comment: relies with bil UE support to take pivtal steps ?  ?  ?  ?  ?  ?  ?  ?  ?  ?  ?  ?   ? ? ? ?Pertinent Vitals/Pain Pain Assessment ?Pain  Assessment: Faces ?Faces Pain Scale: Hurts whole lot ?Pain Location: LLE ?Pain Descriptors / Indicators: Grimacing, Guarding, Constant, Burning, Shooting ?Pain Intervention(s): Limited activity within patient's tolerance, Monitored during session, Repositioned  ? ? ?Home Living Family/patient expects to be discharged to:: Skilled nursing facility ?Living Arrangements: Alone ?Available Help at Discharge: Personal care attendant ?Type of Home: [place] ?Home Access: Elevator ?  ?  ?  ?Home Layout: One level ?Home Equipment: Rollator (4 wheels);Wheelchair - power;Tub bench;Hand held shower head;Adaptive equipment (4LO2) ?Additional Comments: [facility] - recently at rehab side but was at independent before.  Pt had planned to transition to A lving in Aug when it opens  ?  ?Prior Function Prior Level of Function : Needs assist ?  ?  ?  ?Physical Assist : ADLs (physical);Mobility (physical) ?Mobility (physical): Gait ?ADLs (physical): Bathing;Dressing;Toileting ?Mobility Comments: pt reports walking limited distance in room with rollator, power chair. Working with PT/OT ?ADLs Comments: meals brought to room, assist for laundry and cleaning, as well as cream for her feet. Pt bathes on bench with hand held shower and long handled sponge. Recently staff has been assisting with bathes due to wounds at BLEs and wrappings.  Had been using 3N1 beside the bed ?  ? ? ?Hand Dominance  ? Dominant Hand: Right ? ?  ?Extremity/Trunk Assessment  ? Upper Extremity Assessment ?Upper Extremity Assessment: Defer to OT evaluation ?  ? ?Lower Extremity Assessment ?Lower Extremity Assessment: RLE deficits/detail;LLE deficits/detail ?RLE Deficits / Details: grossly 2+/5 ?LLE Deficits / Details: grossly 2+/5 ?  ? ?Cervical / Trunk Assessment ?Cervical / Trunk Assessment: Other exceptions ?Cervical / Trunk Exceptions: increased body habitus  ?Communication  ? Communication: No difficulties  ?Cognition Arousal/Alertness: Awake/alert, Lethargic ?Behavior During Therapy: [facility] for tasks assessed/performed ?Overall Cognitive Status: Within Functional Limits for tasks assessed ?  ?  ?  ?  ?  ?  ?  ?  ?  ?  ?  ?  ?  ?  ?  ?  ?  ?  ?  ? ?  ?General Comments General comments (skin integrity, edema, etc.): Pt on 4LO2 with VSS ? ?  ?Exercises General Exercises - Lower Extremity ?Ankle Circles/Pumps: AROM, Both, 10 reps, Supine ?Long Arc Quad: AROM, Both, 5 reps, Seated  ? ?Assessment/Plan  ?   ?PT Assessment Patient needs continued PT services  ?PT Problem List Decreased strength;Decreased mobility;Cardiopulmonary status limiting activity;Decreased activity tolerance;Decreased balance;Pain;Decreased range of motion ? ?   ?  ?PT Treatment Interventions Therapeutic exercise;Patient/family education;Therapeutic activities;Functional mobility training;Balance training;Gait training;DME instruction   ? ?PT Goals (Current goals can be found in the Care Plan section)  ?Acute Rehab PT Goals ?Patient Stated Goal: go back to get more therapy ?PT Goal Formulation: With patient ?Time For Goal Achievement: 04/23/21 ?Potential to Achieve Goals: Fair ? ?  ?Frequency Min 2X/week ?  ? ? ?Co-evaluation   ?  ?  ?  ?  ? ? ?  ?AM-PAC PT "6 Clicks" Mobility  ?Outcome Measure Help needed turning from your back to your side while in a flat bed without using bedrails?: A Little ?Help needed moving from lying on your back to sitting on the side of a flat bed without using bedrails?: A Little ?Help needed moving to and from a bed to a chair (including a wheelchair)?: A Little ?Help needed standing up from a chair using your arms (e.g., wheelchair or bedside chair)?: A Little ?Help needed to walk in hospital room?: A Lot ?Help  needed climbing 3-5 steps with a railing? : A Lot ?6 Click Score: 16 ? ?  ?End of Session Equipment Utilized During Treatment: Oxygen ?Activity Tolerance: Patient limited by fatigue ?Patient left: in chair;with call bell/phone within reach;with chair alarm set ?Nurse Communication: Mobility status ?PT Visit Diagnosis: Pain;Muscle weakness (generalized) (M62.81);Difficulty in walking, not elsewhere classified (R26.2) ?Pain - Right/Left:  (bil) ?Pain - part of body: Leg ?  ? ?Time: 8341-9622 ?PT Time Calculation (min) (ACUTE ONLY): 29 min ? ? ?Charges:   PT Evaluation ?$PT Eval Moderate Complexity: 1 Mod ?PT Treatments ?$Therapeutic Activity: 8-22 mins ?  ?   ? ? ?Merick Kelleher M,PT ?Acute Rehab  Services ?[phone] (pager)  ? ?Alvira Philips ?04/09/2021, 2:26 PM ? ?

## 2021-04-09 NOTE — Progress Notes (Signed)
Friend of patient brought home CPAP.  RT setup/ added sterile water to CPAP.  Machine within reach of patient. ?

## 2021-04-09 NOTE — Plan of Care (Signed)
  Problem: Activity: Goal: Risk for activity intolerance will decrease Outcome: Not Progressing   

## 2021-04-10 DIAGNOSIS — N179 Acute kidney failure, unspecified: Secondary | ICD-10-CM | POA: Diagnosis not present

## 2021-04-10 DIAGNOSIS — I959 Hypotension, unspecified: Secondary | ICD-10-CM | POA: Diagnosis not present

## 2021-04-10 DIAGNOSIS — I4891 Unspecified atrial fibrillation: Secondary | ICD-10-CM | POA: Diagnosis not present

## 2021-04-10 LAB — GLUCOSE, CAPILLARY
Glucose-Capillary: 168 mg/dL — ABNORMAL HIGH (ref 70–99)
Glucose-Capillary: 191 mg/dL — ABNORMAL HIGH (ref 70–99)
Glucose-Capillary: 179 mg/dL — ABNORMAL HIGH (ref 70–99)
Glucose-Capillary: 246 mg/dL — ABNORMAL HIGH (ref 70–99)

## 2021-04-10 LAB — CBC
HCT: 28.5 % — ABNORMAL LOW (ref 36.0–46.0)
Hemoglobin: 8.4 g/dL — ABNORMAL LOW (ref 12.0–15.0)
MCH: 26.8 pg (ref 26.0–34.0)
MCHC: 29.5 g/dL — ABNORMAL LOW (ref 30.0–36.0)
MCV: 90.8 fL (ref 80.0–100.0)
Platelets: 240 10*3/uL (ref 150–400)
RBC: 3.14 MIL/uL — ABNORMAL LOW (ref 3.87–5.11)
RDW: 17.2 % — ABNORMAL HIGH (ref 11.5–15.5)
WBC: 7.8 10*3/uL (ref 4.0–10.5)
nRBC: 0 % (ref 0.0–0.2)

## 2021-04-10 LAB — BASIC METABOLIC PANEL
Anion gap: 10 (ref 5–15)
BUN: 32 mg/dL — ABNORMAL HIGH (ref 8–23)
CO2: 30 mmol/L (ref 22–32)
Calcium: 9.5 mg/dL (ref 8.9–10.3)
Chloride: 101 mmol/L (ref 98–111)
Creatinine, Ser: 1.12 mg/dL — ABNORMAL HIGH (ref 0.44–1.00)
GFR, Estimated: 52 mL/min — ABNORMAL LOW (ref >60)
Glucose, Bld: 175 mg/dL — ABNORMAL HIGH (ref 70–99)
Potassium: 4 mmol/L (ref 3.5–5.1)
Sodium: 141 mmol/L (ref 135–145)

## 2021-04-10 LAB — VITAMIN D 25 HYDROXY (VIT D DEFICIENCY, FRACTURES): Vit D, 25-Hydroxy: 50.18 ng/mL (ref 30–100)

## 2021-04-10 MED ORDER — HYDROCODONE-ACETAMINOPHEN 5-325 MG PO TABS
1.0000 | ORAL_TABLET | ORAL | Status: DC | PRN
  Administered 2021-04-10 – 2021-04-13 (×14): 1 via ORAL
  Filled 2021-04-10 (×14): qty 1

## 2021-04-10 MED ORDER — FUROSEMIDE 10 MG/ML IJ SOLN
20.0000 mg | Freq: Two times a day (BID) | INTRAMUSCULAR | Status: DC
  Administered 2021-04-10 (×2): 20 mg via INTRAVENOUS
  Filled 2021-04-10 (×2): qty 2

## 2021-04-10 MED ORDER — MIDODRINE HCL 5 MG PO TABS
5.0000 mg | ORAL_TABLET | Freq: Three times a day (TID) | ORAL | Status: DC
  Administered 2021-04-10 – 2021-04-13 (×11): 5 mg via ORAL
  Filled 2021-04-10 (×11): qty 1

## 2021-04-10 MED ORDER — BISACODYL 10 MG RE SUPP
10.0000 mg | RECTAL | Status: AC
  Filled 2021-04-10: qty 1

## 2021-04-10 NOTE — Plan of Care (Signed)

## 2021-04-10 NOTE — Progress Notes (Addendum)
? ?Progress Note ? ?Patient Name: [name] ?Date of Encounter: 04/10/2021 ? ?[facility] Cardiologist: Evalina Field, MD  ? ?Subjective  ? ?Occ brief chest pain with moving in chair ?No significant SOB.   ? ?Inpatient Medications  ?  ?Scheduled Meds: ? apixaban  5 mg Oral BID  ? Chlorhexidine Gluconate Cloth  6 each Topical Q0600  ? ferrous sulfate  325 mg Oral Q breakfast  ? fluticasone furoate-vilanterol  1 puff Inhalation Daily  ? furosemide  20 mg Intravenous BID  ? insulin aspart  0-20 Units Subcutaneous TID WC  ? insulin aspart  0-5 Units Subcutaneous QHS  ? lidocaine  1 patch Transdermal Daily  ? midodrine  5 mg Oral TID WC  ? mupirocin ointment  1 application. Nasal BID  ? rosuvastatin  20 mg Oral QHS  ? senna-docusate  1 tablet Oral BID  ? umeclidinium bromide  1 puff Inhalation Daily  ? ?Continuous Infusions: ? ?PRN Meds: ?[DISCONTINUED] acetaminophen **OR** acetaminophen, diltiazem, HYDROcodone-acetaminophen, ondansetron **OR** ondansetron (ZOFRAN) IV  ? ?Vital Signs  ?  ?Vitals:  ? 04/09/21 1002 04/09/21 1700 04/09/21 2100 04/10/21 0433  ?BP: 118/80 116/78 (!) 121/57 (!) 137/94  ?Pulse: 88 84 96 97  ?Resp: '18  19 20  '$ ?Temp: 98.2 ?F (36.8 ?C) 98 ?F (36.7 ?C) 98.4 ?F (36.9 ?C) 98.1 ?F (36.7 ?C)  ?TempSrc: Oral Oral Oral Oral  ?SpO2: 100% 100% 100% 100%  ?Weight:      ?Height:      ? ? ?Intake/Output Summary (Last 24 hours) at 04/10/2021 1030 ?Last data filed at 04/10/2021 0900 ?Gross per 24 hour  ?Intake 820 ml  ?Output 2475 ml  ?Net -1655 ml  ? ? ?  04/07/2021  ? 12:00 AM 04/02/2021  ?  4:18 AM 04/01/2021  ?  6:35 AM  ?Last 3 Weights  ?Weight (lbs) 338 lb 6.5 oz 338 lb 6.5 oz 339 lb 11.7 oz  ?Weight (kg) 153.5 kg 153.5 kg 154.1 kg  ?   ? ?Telemetry  ?  ?A fib  at times rate controlled and at times rapid- Personally Reviewed ? ?ECG  ?  ?No new - Personally Reviewed ? ?Physical Exam  ? ?GEN: No acute distress.   ?Neck: No JVD sitting up in chair ?Cardiac: irreg irreg, no murmurs, rubs, or gallops.   ?Respiratory: Clear to auscultation bilaterally.though diminished ?GI: Soft, nontender, non-distended  ?MS: + edema; legs wrapped due to ulcers ?Neuro:  Nonfocal  ?Psych: Normal affect  ? ?Labs  ?  ?High Sensitivity Troponin:   ?Recent Labs  ?Lab 03/30/21 ?2049 03/30/21 ?2250 04/06/21 ?2027 04/07/21 ?0006  ?TROPONINIHS 21* 22* 41* 45*  ?   ?Chemistry ?Recent Labs  ?Lab 04/06/21 ?2027 04/06/21 ?2033 04/08/21 ?0428 04/10/21 ?0332  ?NA 139 140 140 141  ?K 4.1 4.0 3.8 4.0  ?CL 96* 100 100 101  ?CO2 29  --  29 30  ?GLUCOSE 132* 133* 263* 175*  ?BUN 55* 49* 48* 32*  ?CREATININE 1.90* 1.90* 1.45* 1.12*  ?CALCIUM 8.7*  --  8.8* 9.5  ?MG  --   --  1.9  --   ?PROT 6.5  --  6.8  --   ?ALBUMIN 3.0*  --  2.9*  --   ?AST 19  --  27  --   ?ALT 20  --  23  --   ?ALKPHOS 37*  --  40  --   ?BILITOT 0.4  --  0.3  --   ?GFRNONAA 27*  --  38* 52*  ?ANIONGAP 14  --  11 10  ?  ?Lipids No results for input(s): CHOL, TRIG, HDL, LABVLDL, LDLCALC, CHOLHDL in the last 168 hours.  ?Hematology ?Recent Labs  ?Lab 04/08/21 ?0428 04/09/21 ?0410 04/10/21 ?3846  ?WBC 9.1 9.0 7.8  ?RBC 2.98* 3.01* 3.14*  ?HGB 8.1* 8.2* 8.4*  ?HCT 27.0* 27.4* 28.5*  ?MCV 90.6 91.0 90.8  ?MCH 27.2 27.2 26.8  ?MCHC 30.0 29.9* 29.5*  ?RDW 17.8* 17.4* 17.2*  ?PLT 257 250 240  ? ?Thyroid  ?Recent Labs  ?Lab 04/08/21 ?0428  ?TSH 3.802  ?  ?BNP ?Recent Labs  ?Lab 04/06/21 ?2027  ?BNP 180.2*  ?  ?DDimer No results for input(s): DDIMER in the last 168 hours.  ? ?Radiology  ?  ?No results found. ? ?Cardiac Studies  ? ?04/07/21 Echo ?FINDINGS  ? Left Ventricle: Left ventricular ejection fraction, by estimation, is 60  ?to 65%. The left ventricle has normal function. Left ventricular  ?endocardial border not optimally defined to evaluate regional wall motion.  ?Definity contrast agent was given IV to  ?delineate the left ventricular endocardial borders. The left ventricular  ?internal cavity size was severely dilated. There is moderate asymmetric  ?left ventricular hypertrophy of  the basal-septal segment. Left ventricular  ?diastolic parameters are  ?indeterminate.  ? ?Right Ventricle: The right ventricular size is not well visualized. Right  ?vetricular wall thickness was not well visualized. Right ventricular  ?systolic function was not well visualized. Tricuspid regurgitation signal  ?is inadequate for assessing PA  ?pressure.  ? ?Left Atrium: Left atrial size was not well visualized.  ? ?Right Atrium: Right atrial size was not well visualized.  ? ?Pericardium: Trivial pericardial effusion is present.  ? ?Mitral Valve: The mitral valve is grossly normal. No evidence of mitral  ?valve regurgitation.  ? ?Tricuspid Valve: The tricuspid valve is grossly normal. Tricuspid valve  ?regurgitation is not demonstrated. No evidence of tricuspid stenosis.  ? ?Aortic Valve: The aortic valve was not well visualized. Aortic valve  ?regurgitation is not visualized. Aortic valve peak gradient measures 12.5  ?mmHg.  ? ?Pulmonic Valve: The pulmonic valve was not well visualized. Pulmonic valve  ?regurgitation is not visualized.  ? ?Aorta: The ascending aorta was not well visualized.  ? ?Venous: The inferior vena cava is dilated in size with greater than 50%  ?respiratory variability, suggesting right atrial pressure of 8 mmHg.  ? ?IAS/Shunts: No atrial level shunt detected by color flow Doppler.  ? ?Patient Profile  ?   ?76 y.o. female history of CAD with known CTO of RCA , chronic diastolic CHF, chronic lower extremity edema with chronic wounds,  hypertension, hyperlipidemia, type 2 diabetes mellitus on insulin, pheochromocytoma s/p resection, COPD, chronic hypoxic respiratory failure following COVID-19 infection on home O2, obstructive sleep apnea on CPAP, CKD stage III, chronic pain syndrome, anemia of chronic disease, and morbid obesity with BMI >60 now with new atrial fib. ? ?Assessment & Plan  ?  ?Atrial fib on Eliquis, dilt Ef was 60-65% and with Definity contrast agent was given IV to delineate the  left ventricular endocardial borders. The left ventricular  ?internal cavity size was severely dilated. There is moderate asymmetric left ventricular hypertrophy of the basal-septal segment.  Echo difficult.    ?--continue anticoagulation and rate control.  Follow up in office and arrange DCCV in 3-4 weeks.   ? ? Morbid obesity: This is complicated by obstructive sleep apnea, obesity hypoventilation, acute on chronic diastolic dysfunction, severe leg edema  with weeping of serous fluid from her legs. Does not wear CPAP.   ? Pulmonary consult for sleep apnea and ? Inspire device she has not really followed anyone for her sleep apnea but with new a fib needs to be addressed, we discussed. ?She has been seen by the wound care team. ?She has been advised her to elevate her legs but she says that she is limited in how much she can elevate them because of arthritis ? ?Hypotension Midodrine added.  ?--BP improved 137/94 to 128/70  ? ?Chronic diastolic CHF  ?-+ 86 cc though improved from +1411.   no recent weights  on lasix 20 mg BID.   ?--placed on 1500 cc fluid restriction.  ? ?CKD 3A improved.  Cr down from 1.90 ? ?DM-2/leg wounds/UTI per IM has follow up with wound clinic.  ? ?   ? ?For questions or updates, please contact [place] ?Please consult [email or web address] for contact info under  ? ?  ?   ?Signed, ?Cecilie Kicks, NP  ?04/10/2021, 10:30 AM   ? ?Attending Note:  ? ?The patient was seen and examined.  Agree with assessment and plan as noted above.  Changes made to the above note as needed. ? ?Patient seen and independently examined with Cecilie Kicks, NP .   We discussed all aspects of the encounter. I agree with the assessment and plan as stated above.  ? ? Atrial fib:   HR is well controlled.  Continue anticoagulation  ? ?2.  Chronic diastolic chf:   also has obesity hypoventilation , uncontrolled OSA  ?Has diuresed some  ?Cont lasix  ?Midodrine for BP support ?Advised her to elevated legs as much as possible   ?Says its too uncomfortable  ? ?3.  Leg wounds:   having the wound care team address.  ? ? I have spent a total of 40 minutes with patient reviewing hospital  notes , telemetry, EKGs, labs and examining pati

## 2021-04-10 NOTE — Progress Notes (Signed)
Patient has home CPAP at bedside.  Patient stated that she will wear the oxygen tonight as she could not get any sleep last night with her CPAP.  RT will continue to monitor. ?

## 2021-04-10 NOTE — Progress Notes (Signed)
? [name]  DQQ:[id number] DOB: Mar 11, 1945 DOA: 04/06/2021 ?PCP: [name], NP   ? ?Brief Narrative:  ?76 year old with a history of morbid obesity, chronic hypoxic respiratory failure on 6 L nasal cannula at baseline, chronic diastolic CHF, chronic venous stasis, DM2, HTN, HLD, and chronic anemia who was admitted to [facility] 3/13 > 04/02/2021 for AMS and AKI felt to be due to polypharmacy and who was returned to the ED from her SNF due to hypotension (64/42).  ? ?There was initial concern in the ED she was septic due to chronic B LE wounds, but on careful physical exam there was no evidence of an actual wound infection/cellulitis. While in the ED she was noted to be in Afib w/ RVR. This is a new finding for her.  ? ?Consultants:  ?cards ? ?Code Status: NO CODE BLUE ? ?DVT prophylaxis: ?eliquis ? ?Interim Hx: ?Reported to nursing some difficulty remembering ? ? ?Assessment & Plan: ? ?Newly diagnosed atrial fibrillation ?-Was not present last week during admission  ?-rate presently well controlled  ?-TSH normal ?-echo with preserved EF ?-change heparin to eliquis ?-cards consult appreciated  ? ?Hypotension ?-appears resolved ?-will attempt diuresis with IV lasix ? ?Possible UTI ?Urinalysis at admission significantly abnormal -on empiric antibiotic, but will narrow spectrum ?-treated for 3 days ? ?Acute kidney injury ?Creatinine had improved to 1.48 at time of discharge 3/16 from a peak of 3.53 3/13 -not surprisingly with episodes of hypotension creatinine has increased again  ?-daily labs ?-watch closely while getting diuresis ? ?Hx of Pheochromocytoma s/p remote right adrenalectomy ?-cortisol normal ? ?Normocytic anemia of chronic disease ?Hemoglobin 8-8.5 during recent hospitalization -no evidence of acute blood loss -monitor trend in serial fashion ? ?Multiple open leg wounds - chronic venous stasis bilateral lower extremities ?Was cared for by [facility] during prior admission with no evidence of active infection  -still no evidence of active infection this admission -hold any further antibiotic therapy and continue wound care ?-unable to elevate extremity or tolerate UNNA boots which limits her healing ? ?Chronic pain syndrome ?Care with narcotics as patient was found to be high risk for medication induced obtundation during last admission -patient did not feel Lyrica was helping her neuropathic pain therefore it has been discontinued ?-does not tolerate Neurontin ? ?Chronic diastolic CHF ?Suspect lower extremity edema is primarily related to venous stasis/obesity/hypoalbuminemia -ability to diuresis greatly limited by hypotension and probable intravascular volume depletion- will do IV lasix BID for now ? ?DM2 ?-SSI ? ?OSA on CPAP ?-does not wear ? ?Morbid obesity -  ?-Estimated body mass index is 61.9 kg/m? as calculated from the following: ?  Height as of this encounter: '5\' 2"'$  (1.575 m). ?  Weight as of this encounter: 153.5 kg.  ? ? ?Family Communication: son 3/23 ?Disposition: from SNF - anticipate need to return to SNF when medically stable  ? ?Objective: ?Blood pressure 128/70, pulse 87, temperature 98 ?F (36.7 ?C), temperature source Oral, resp. rate 20, height '5\' 2"'$  (1.575 m), weight (!) 153.5 kg, SpO2 100 %. ? ?Intake/Output Summary (Last 24 hours) at 04/10/2021 1400 ?Last data filed at 04/10/2021 1254 ?Gross per 24 hour  ?Intake 580 ml  ?Output 3000 ml  ?Net -2420 ml  ? ?Filed Weights  ? 04/07/21 0000  ?Weight: (!) 153.5 kg  ? ? ?Examination: ? ? ?General: Appearance:    Severely obese female in no acute distress  ?   ?Lungs:      respirations unlabored  ?Heart:  Normal heart rate. Irregularly irregular rhythm.   ?MS:   All extremities are intact. Improved edema in LE ?  ?Neurologic:   Awake, alert, oriented x 3  ?  ?  ? ?CBC: ?Recent Labs  ?Lab 04/06/21 ?2027 04/06/21 ?2033 04/08/21 ?0428 04/09/21 ?0410 04/10/21 ?8466  ?WBC 10.7*  --  9.1 9.0 7.8  ?NEUTROABS 8.9*  --   --   --   --   ?HGB 7.9*   < > 8.1* 8.2* 8.4*   ?HCT 27.6*   < > 27.0* 27.4* 28.5*  ?MCV 94.2  --  90.6 91.0 90.8  ?PLT 249  --  257 250 240  ? < > = values in this interval not displayed.  ? ?Basic Metabolic Panel: ?Recent Labs  ?Lab 04/06/21 ?2027 04/06/21 ?2033 04/08/21 ?0428 04/10/21 ?0332  ?NA 139 140 140 141  ?K 4.1 4.0 3.8 4.0  ?CL 96* 100 100 101  ?CO2 29  --  29 30  ?GLUCOSE 132* 133* 263* 175*  ?BUN 55* 49* 48* 32*  ?CREATININE 1.90* 1.90* 1.45* 1.12*  ?CALCIUM 8.7*  --  8.8* 9.5  ?MG  --   --  1.9  --   ? ?GFR: ?Estimated Creatinine Clearance: 63.7 mL/min (A) (by C-G formula based on SCr of 1.12 mg/dL (H)). ? ?Liver Function Tests: ?Recent Labs  ?Lab 04/06/21 ?2027 04/08/21 ?0428  ?AST 19 27  ?ALT 20 23  ?ALKPHOS 37* 40  ?BILITOT 0.4 0.3  ?PROT 6.5 6.8  ?ALBUMIN 3.0* 2.9*  ? ? ?HbA1C: ?Hgb A1c MFr Bld  ?Date/Time Value Ref Range Status  ?04/07/2021 12:06 AM 6.3 (H) 4.8 - 5.6 % Final  ?  Comment:  ?  (NOTE) ?Pre diabetes:          5.7%-6.4% ? ?Diabetes:              >6.4% ? ?Glycemic control for   <7.0% ?adults with diabetes ?  ?02/01/2021 03:25 AM 7.2 (H) 4.8 - 5.6 % Final  ?  Comment:  ?  (NOTE) ?Pre diabetes:          5.7%-6.4% ? ?Diabetes:              >6.4% ? ?Glycemic control for   <7.0% ?adults with diabetes ?  ? ? ?CBG: ?Recent Labs  ?Lab 04/09/21 ?1149 04/09/21 ?1611 04/09/21 ?2145 04/10/21 ?0717 04/10/21 ?1210  ?GLUCAP 251* 234* 215* 168* 191*  ? ? ?Scheduled Meds: ? apixaban  5 mg Oral BID  ? Chlorhexidine Gluconate Cloth  6 each Topical Q0600  ? ferrous sulfate  325 mg Oral Q breakfast  ? fluticasone furoate-vilanterol  1 puff Inhalation Daily  ? furosemide  20 mg Intravenous BID  ? insulin aspart  0-20 Units Subcutaneous TID WC  ? insulin aspart  0-5 Units Subcutaneous QHS  ? lidocaine  1 patch Transdermal Daily  ? midodrine  5 mg Oral TID WC  ? mupirocin ointment  1 application. Nasal BID  ? rosuvastatin  20 mg Oral QHS  ? senna-docusate  1 tablet Oral BID  ? umeclidinium bromide  1 puff Inhalation Daily  ? ?Continuous Infusions: ? ? ? ?  LOS: 3 days  ? ?Eulogio Bear DO ?[facility] ? ? ?If 7PM-7AM, please contact night-coverage per Amion ?04/10/2021, 2:00 PM ? ? ? ?

## 2021-04-10 NOTE — Evaluation (Signed)
Occupational Therapy Evaluation ?Patient Details ?Name: [name] ?MRN: [id number] ?DOB: 1945/03/23 ?Today's Date: 04/10/2021 ? ? ?History of Present Illness 76 year old admitted 3/20 to the ED from her SNF due to hypotension (64/42).  Recently admitted to [facility] 3/13 > 04/02/2021 for AMS and AKI felt to be due to polypharmacy. PMH:  morbid obesity, chronic hypoxic respiratory failure on 6 L nasal cannula at baseline, chronic diastolic CHF, chronic venous stasis, DM2, HTN, HLD, and chronic anemia  ? ?Clinical Impression ?  ?Pt presents from SNF rehab with deficits in cardiopulmonary tolerance, dynamic standing balance, pain and strength. Pt hopeful to progress to transition to [place] new ALF through continued SNF rehab as good functional gains have been made in the past. Pt requires min guard for basic transfers limited by L LE deficits. Pt requires Min A for UB ADLs and up to Max A for LB ADLs. Plan to progress standing tolerance, energy conservation education and UE strength in next sessions. Recommend return to SNF for continued rehab at DC. ? ?SpO2 95% on 4 L O2 ?   ? ?Recommendations for follow up therapy are one component of a multi-disciplinary discharge planning process, led by the attending physician.  Recommendations may be updated based on patient status, additional functional criteria and insurance authorization.  ? ?Follow Up Recommendations ? Skilled nursing-short term rehab (<3 hours/day)  ?  ?Assistance Recommended at Discharge Frequent or constant Supervision/Assistance  ?Patient can return home with the following A little help with walking and/or transfers;A lot of help with bathing/dressing/bathroom ? ?  ?Functional Status Assessment ? Patient has had a recent decline in their functional status and demonstrates the ability to make significant improvements in function in a reasonable and predictable amount of time.  ?Equipment Recommendations ? None recommended by OT  ?  ?Recommendations for  Other Services   ? ? ?  ?Precautions / Restrictions Precautions ?Precautions: Fall;Other (comment) ?Precaution Comments: LLE wound, 02, watch BP ?Restrictions ?Weight Bearing Restrictions: No  ? ?  ? ?Mobility Bed Mobility ?  ?  ?  ?  ?  ?  ?  ?General bed mobility comments: on BSC on entry ?  ? ?Transfers ?Overall transfer level: Needs assistance ?Equipment used:  (bedrail) ?Transfers: Sit to/from Stand, Bed to chair/wheelchair/BSC ?Sit to Stand: Min guard ?  ?  ?Step pivot transfers: Min guard ?  ?  ?General transfer comment: increased time to stand from [facility], use bedrail to turn to recliner; reports difficulty picking up L foot ?  ? ?  ?Balance Overall balance assessment: Needs assistance ?Sitting-balance support: No upper extremity supported, Feet supported ?Sitting balance-Leahy Scale: Fair ?  ?  ?Standing balance support: During functional activity, Bilateral upper extremity supported ?Standing balance-Leahy Scale: Poor ?  ?  ?  ?  ?  ?  ?  ?  ?  ?  ?  ?  ?   ? ?ADL either performed or assessed with clinical judgement  ? ?ADL Overall ADL's : Needs assistance/impaired ?Eating/Feeding: Independent;Sitting ?  ?Grooming: Set up;Sitting ?  ?Upper Body Bathing: Minimal assistance;Sitting ?  ?Lower Body Bathing: Moderate assistance;Sit to/from stand;Sitting/lateral leans ?  ?Upper Body Dressing : Minimal assistance;Sitting ?  ?Lower Body Dressing: Maximal assistance;Sit to/from stand ?  ?Toilet Transfer: Min guard;Stand-pivot;BSC/3in1 ?  ?Toileting- Clothing Manipulation and Hygiene: Moderate assistance;Sit to/from stand ?  ?  ?  ?  ?General ADL Comments: Pt reporting L LE soreness/weeping and coordination deficits when stepping, able to perform [facility] transfer with bedrail turning  back to recliner.  ? ? ? ?Vision Ability to See in Adequate Light: 0 Adequate ?Patient Visual Report: No change from baseline ?Vision Assessment?: No apparent visual deficits  ?   ?Perception   ?  ?Praxis   ?  ? ?Pertinent Vitals/Pain Pain  Assessment ?Pain Assessment: Faces ?Faces Pain Scale: Hurts little more ?Pain Location: LLE ?Pain Descriptors / Indicators: Grimacing, Guarding, Constant, Burning, Shooting ?Pain Intervention(s): Limited activity within patient's tolerance, Monitored during session, Premedicated before session  ? ? ? ?Hand Dominance Right ?  ?Extremity/Trunk Assessment Upper Extremity Assessment ?Upper Extremity Assessment: RUE deficits/detail;LUE deficits/detail ?RUE Deficits / Details: strength 3+/5; reports difficulty reaching behind back for hygiene, pulling up pants and washing back ?LUE Deficits / Details: strength 3+/5; reports difficulty reaching behind back for hygiene, pulling up pants and washing back ?  ?Lower Extremity Assessment ?Lower Extremity Assessment: Defer to PT evaluation ?  ?Cervical / Trunk Assessment ?Cervical / Trunk Assessment: Other exceptions ?Cervical / Trunk Exceptions: increased body habitus ?  ?Communication Communication ?Communication: No difficulties ?  ?Cognition Arousal/Alertness: Awake/alert ?Behavior During Therapy: [facility] for tasks assessed/performed ?Overall Cognitive Status: Within Functional Limits for tasks assessed ?  ?  ?  ?  ?  ?  ?  ?  ?  ?  ?  ?  ?  ?  ?  ?  ?General Comments: accurate, detaled recall of timeline and functional declines/gains; hyperverbose ?  ?  ?General Comments  Pt on 4LO2, SpO2 95% after transfer though reports need to catch her breath. Pt also reports unable to wear grip socks, requesting shoe covers with AB pad in them (thouhg educated this could be slick) ? ?  ?Exercises   ?  ?Shoulder Instructions    ? ? ?Home Living Family/patient expects to be discharged to:: Skilled nursing facility ?Living Arrangements: Alone ?Available Help at Discharge: Personal care attendant ?Type of Home: [place] ?Home Access: Elevator ?  ?  ?Home Layout: One level ?  ?  ?Bathroom Shower/Tub: Walk-in shower ?  ?Bathroom Toilet: Handicapped height ?  ?  ?Home Equipment:  Rollator (4 wheels);Wheelchair - power;Tub bench;Hand held shower head;Adaptive equipment (4 L O2) ?Adaptive Equipment: Long-handled sponge;Reacher;Long-handled shoe horn ?Additional Comments: [facility] - recently at rehab side but was at independent before.  Pt had planned to transition to [facility] in Aug when it opens ?  ? ?  ?Prior Functioning/Environment Prior Level of Function : Needs assist ?  ?  ?  ?Physical Assist : ADLs (physical);Mobility (physical) ?Mobility (physical): Gait ?ADLs (physical): Bathing;Dressing;Toileting ?Mobility Comments: pt reports walking limited distance in room with rollator, power chair. Working with PT/OT ?ADLs Comments: meals brought to room, assist for laundry and cleaning, as well as cream for her feet. Pt bathes on bench with hand held shower and long handled sponge. Recently staff has been assisting with baths due to wounds at BLEs and wrappings.  Had been using 3N1 beside the bed ?  ? ?  ?  ?OT Problem List: Decreased strength;Decreased range of motion;Decreased activity tolerance;Impaired balance (sitting and/or standing);Decreased knowledge of precautions;Decreased knowledge of use of DME or AE;Cardiopulmonary status limiting activity;Obesity ?  ?   ?OT Treatment/Interventions: Self-care/ADL training;Therapeutic exercise;Energy conservation;DME and/or AE instruction;Therapeutic activities;Patient/family education  ?  ?OT Goals(Current goals can be found in the care plan section) Acute Rehab OT Goals ?Patient Stated Goal: improve strength ?OT Goal Formulation: With patient ?Time For Goal Achievement: 04/24/21 ?Potential to Achieve Goals: Good  ?OT Frequency: Min 2X/week ?  ? ?  Co-evaluation   ?  ?  ?  ?  ? ?  ?AM-PAC OT "6 Clicks" Daily Activity     ?Outcome Measure Help from another person eating meals?: None ?Help from another person taking care of personal grooming?: None ?Help from another person toileting, which includes using toliet, bedpan, or urinal?: A Lot ?Help from  another person bathing (including washing, rinsing, drying)?: A Lot ?Help from another person to put on and taking off regular upper body clothing?: A Little ?Help from another person to put on and taking o

## 2021-04-10 NOTE — Care Management Important Message (Signed)
Important Message ? ?Patient Details  ?Name: [name] ?MRN: [id number] ?Date of Birth: 1945/05/15 ? ? ?Medicare Important Message Given:  Yes ? ? ? ? ?Keala Drum ?04/10/2021, 11:58 AM ?

## 2021-04-11 DIAGNOSIS — I10 Essential (primary) hypertension: Secondary | ICD-10-CM | POA: Diagnosis not present

## 2021-04-11 DIAGNOSIS — G894 Chronic pain syndrome: Secondary | ICD-10-CM | POA: Diagnosis not present

## 2021-04-11 DIAGNOSIS — N179 Acute kidney failure, unspecified: Secondary | ICD-10-CM | POA: Diagnosis not present

## 2021-04-11 LAB — GLUCOSE, CAPILLARY
Glucose-Capillary: 187 mg/dL — ABNORMAL HIGH (ref 70–99)
Glucose-Capillary: 218 mg/dL — ABNORMAL HIGH (ref 70–99)
Glucose-Capillary: 215 mg/dL — ABNORMAL HIGH (ref 70–99)
Glucose-Capillary: 214 mg/dL — ABNORMAL HIGH (ref 70–99)

## 2021-04-11 LAB — BASIC METABOLIC PANEL
Anion gap: 8 (ref 5–15)
BUN: 34 mg/dL — ABNORMAL HIGH (ref 8–23)
CO2: 31 mmol/L (ref 22–32)
Calcium: 9.6 mg/dL (ref 8.9–10.3)
Chloride: 100 mmol/L (ref 98–111)
Creatinine, Ser: 1.2 mg/dL — ABNORMAL HIGH (ref 0.44–1.00)
GFR, Estimated: 48 mL/min — ABNORMAL LOW (ref >60)
Glucose, Bld: 179 mg/dL — ABNORMAL HIGH (ref 70–99)
Potassium: 3.8 mmol/L (ref 3.5–5.1)
Sodium: 139 mmol/L (ref 135–145)

## 2021-04-11 LAB — CBC
HCT: 28.4 % — ABNORMAL LOW (ref 36.0–46.0)
Hemoglobin: 8.3 g/dL — ABNORMAL LOW (ref 12.0–15.0)
MCH: 26.3 pg (ref 26.0–34.0)
MCHC: 29.2 g/dL — ABNORMAL LOW (ref 30.0–36.0)
MCV: 89.9 fL (ref 80.0–100.0)
Platelets: 255 10*3/uL (ref 150–400)
RBC: 3.16 MIL/uL — ABNORMAL LOW (ref 3.87–5.11)
RDW: 16.9 % — ABNORMAL HIGH (ref 11.5–15.5)
WBC: 8.5 10*3/uL (ref 4.0–10.5)
nRBC: 0.2 % (ref 0.0–0.2)

## 2021-04-11 LAB — CULTURE, BLOOD (ROUTINE X 2): Culture: NO GROWTH

## 2021-04-11 MED ORDER — FUROSEMIDE 10 MG/ML IJ SOLN
20.0000 mg | Freq: Every day | INTRAMUSCULAR | Status: DC
  Administered 2021-04-11 – 2021-04-12 (×2): 20 mg via INTRAVENOUS
  Filled 2021-04-11 (×2): qty 2

## 2021-04-11 NOTE — Progress Notes (Signed)
? [name]  VFI:[id number] DOB: [birth year] DOA: 04/06/2021 ?PCP: [name], NP   ? ?Brief Narrative:  ?76 year old with a history of morbid obesity, chronic hypoxic respiratory failure on 6 L nasal cannula at baseline, chronic diastolic CHF, chronic venous stasis, DM2, HTN, HLD, and chronic anemia who was admitted to [facility] 3/13 > 04/02/2021 for AMS and AKI felt to be due to polypharmacy and who was returned to the ED from her SNF due to hypotension (64/42).  ? ?There was initial concern in the ED she was septic due to chronic B LE wounds, but on careful physical exam there was no evidence of an actual wound infection/cellulitis. While in the ED she was noted to be in Afib w/ RVR. This is a new finding for her.  ? ?Consultants:  ?cards ? ?Code Status: NO CODE BLUE ? ?DVT prophylaxis: ?eliquis ? ?Interim Hx: ?Multiple complaints- wound cold, gown does not fit, not sleeping well ? ? ?Assessment & Plan: ? ?Newly diagnosed atrial fibrillation ?-Was not present last week during admission  ?-rate presently well controlled  ?-TSH normal ?-echo with preserved EF ?-change heparin to eliquis ?-cards consult appreciated: plan continue current medical therapy and anticoagulation for afib. Rate-controlled- would consider outpatient DCCV if she remains in afib in 3-4 weeks. ? ?Hypotension ?-appears resolved ?-will attempt diuresis with IV lasix ? ?Possible UTI ?Urinalysis at admission significantly abnormal -on empiric antibiotic, but will narrow spectrum ?-treated for 3 days ? ?Acute kidney injury ?Creatinine had improved to 1.48 at time of discharge 3/16 from a peak of 3.53 3/13 -not surprisingly with episodes of hypotension creatinine has increased again  ?-daily labs ?-watch closely while getting diuresis ? ?Hx of Pheochromocytoma s/p remote right adrenalectomy ?-cortisol normal ? ?Normocytic anemia of chronic disease ?Hemoglobin 8-8.5 during recent hospitalization -no evidence of acute blood loss -monitor trend in  serial fashion ? ?Multiple open leg wounds - chronic venous stasis bilateral lower extremities ?Was cared for by [facility] during prior admission with no evidence of active infection -still no evidence of active infection this admission -hold any further antibiotic therapy and continue wound care ?-unable to elevate extremity or tolerate UNNA boots which limits her healing ? ?Chronic pain syndrome ?Care with narcotics as patient was found to be high risk for medication induced obtundation during last admission -patient did not feel Lyrica was helping her neuropathic pain therefore it has been discontinued ?-does not tolerate Neurontin ? ?Chronic diastolic CHF ?Suspect lower extremity edema is primarily related to venous stasis/obesity/hypoalbuminemia -ability to diuresis greatly limited by hypotension and probable intravascular volume depletion- will do IV lasix  daily ? ?DM2 ?-SSI ? ?OSA on CPAP ?-does not wear ? ?Morbid obesity -  ?-Estimated body mass index is 58.93 kg/m? as calculated from the following: ?  Height as of this encounter: '5\' 2"'$  (1.575 m). ?  Weight as of this encounter: 146.1 kg.  ? ? ?Family Communication: son 3/23 ?Disposition: from SNF - anticipate need to return to SNF when medically stable  ? ?Objective: ?Blood pressure 127/66, pulse 99, temperature 98.2 ?F (36.8 ?C), temperature source Oral, resp. rate 18, height '5\' 2"'$  (1.575 m), weight (!) 146.1 kg, SpO2 100 %. ? ?Intake/Output Summary (Last 24 hours) at 04/11/2021 1430 ?Last data filed at 04/11/2021 1253 ?Gross per 24 hour  ?Intake 937 ml  ?Output 2825 ml  ?Net -1888 ml  ? ?Filed Weights  ? 04/07/21 0000 04/11/21 0741  ?Weight: (!) 153.5 kg (!) 146.1 kg  ? ? ?Examination: ? ?  General: Appearance:    Severely obese female in no acute distress  ?   ?Lungs:     respirations unlabored  ?Heart:    Normal heart rate.   ?MS:   All extremities are intact.  ?  ?Neurologic:   Awake, alert, oriented x 3. No apparent focal neurological           defect.   ?  ?   ?  ? ?CBC: ?Recent Labs  ?Lab 04/06/21 ?2027 04/06/21 ?2033 04/09/21 ?0410 04/10/21 ?5631 04/11/21 ?0251  ?WBC 10.7*   < > 9.0 7.8 8.5  ?NEUTROABS 8.9*  --   --   --   --   ?HGB 7.9*   < > 8.2* 8.4* 8.3*  ?HCT 27.6*   < > 27.4* 28.5* 28.4*  ?MCV 94.2   < > 91.0 90.8 89.9  ?PLT 249   < > 250 240 255  ? < > = values in this interval not displayed.  ? ?Basic Metabolic Panel: ?Recent Labs  ?Lab 04/08/21 ?0428 04/10/21 ?0332 04/11/21 ?0251  ?NA 140 141 139  ?K 3.8 4.0 3.8  ?CL 100 101 100  ?CO2 '29 30 31  '$ ?GLUCOSE 263* 175* 179*  ?BUN 48* 32* 34*  ?CREATININE 1.45* 1.12* 1.20*  ?CALCIUM 8.8* 9.5 9.6  ?MG 1.9  --   --   ? ?GFR: ?Estimated Creatinine Clearance: 57.5 mL/min (A) (by C-G formula based on SCr of 1.2 mg/dL (H)). ? ?Liver Function Tests: ?Recent Labs  ?Lab 04/06/21 ?2027 04/08/21 ?0428  ?AST 19 27  ?ALT 20 23  ?ALKPHOS 37* 40  ?BILITOT 0.4 0.3  ?PROT 6.5 6.8  ?ALBUMIN 3.0* 2.9*  ? ? ?HbA1C: ?Hgb A1c MFr Bld  ?Date/Time Value Ref Range Status  ?04/07/2021 12:06 AM 6.3 (H) 4.8 - 5.6 % Final  ?  Comment:  ?  (NOTE) ?Pre diabetes:          5.7%-6.4% ? ?Diabetes:              >6.4% ? ?Glycemic control for   <7.0% ?adults with diabetes ?  ?02/01/2021 03:25 AM 7.2 (H) 4.8 - 5.6 % Final  ?  Comment:  ?  (NOTE) ?Pre diabetes:          5.7%-6.4% ? ?Diabetes:              >6.4% ? ?Glycemic control for   <7.0% ?adults with diabetes ?  ? ? ?CBG: ?Recent Labs  ?Lab 04/10/21 ?1210 04/10/21 ?1701 04/10/21 ?2040 04/11/21 ?4970 04/11/21 ?1121  ?GLUCAP 191* 179* 246* 187* 218*  ? ? ?Scheduled Meds: ? apixaban  5 mg Oral BID  ? bisacodyl  10 mg Rectal STAT  ? Chlorhexidine Gluconate Cloth  6 each Topical Q0600  ? ferrous sulfate  325 mg Oral Q breakfast  ? fluticasone furoate-vilanterol  1 puff Inhalation Daily  ? furosemide  20 mg Intravenous Daily  ? insulin aspart  0-20 Units Subcutaneous TID WC  ? insulin aspart  0-5 Units Subcutaneous QHS  ? lidocaine  1 patch Transdermal Daily  ? midodrine  5 mg Oral TID WC  ? mupirocin  ointment  1 application. Nasal BID  ? rosuvastatin  20 mg Oral QHS  ? senna-docusate  1 tablet Oral BID  ? umeclidinium bromide  1 puff Inhalation Daily  ? ?Continuous Infusions: ? ? ? ? LOS: 4 days  ? ?Eulogio Bear DO ?[facility] ? ? ?If 7PM-7AM, please contact night-coverage per Amion ?04/11/2021, 2:30 PM ? ? ? ?

## 2021-04-11 NOTE — Final Progress Note (Signed)
? ? ?  Chart reviewed - plan continue current medical therapy and anticoagulation for afib. Rate-controlled- would consider outpatient DCCV if she remains in afib in 3-4 weeks. ? ?[facility] will sign off.   ?Medication Recommendations:  as above ?Other recommendations (labs, testing, etc):  none ?Follow up as an outpatient:  Dr. Audie Box ? ?Pixie Casino, MD, [facility], FACP  ?[place]  ?Medical Director of the Advanced Lipid Disorders &  ?Cardiovascular Risk Reduction Clinic ?Diplomate of the [employer] of Clinical Lipidology ?Attending Cardiologist  ?Direct Dial: [phone]  Fax: [phone]  ?Website:  [email or web address] ? ? ?

## 2021-04-12 DIAGNOSIS — N179 Acute kidney failure, unspecified: Secondary | ICD-10-CM | POA: Diagnosis not present

## 2021-04-12 LAB — CBC
HCT: 30 % — ABNORMAL LOW (ref 36.0–46.0)
Hemoglobin: 9 g/dL — ABNORMAL LOW (ref 12.0–15.0)
MCH: 26.7 pg (ref 26.0–34.0)
MCHC: 30 g/dL (ref 30.0–36.0)
MCV: 89 fL (ref 80.0–100.0)
Platelets: 225 10*3/uL (ref 150–400)
RBC: 3.37 MIL/uL — ABNORMAL LOW (ref 3.87–5.11)
RDW: 16.9 % — ABNORMAL HIGH (ref 11.5–15.5)
WBC: 9.1 10*3/uL (ref 4.0–10.5)
nRBC: 0 % (ref 0.0–0.2)

## 2021-04-12 LAB — GLUCOSE, CAPILLARY
Glucose-Capillary: 181 mg/dL — ABNORMAL HIGH (ref 70–99)
Glucose-Capillary: 259 mg/dL — ABNORMAL HIGH (ref 70–99)
Glucose-Capillary: 188 mg/dL — ABNORMAL HIGH (ref 70–99)
Glucose-Capillary: 221 mg/dL — ABNORMAL HIGH (ref 70–99)

## 2021-04-12 LAB — BASIC METABOLIC PANEL
Anion gap: 11 (ref 5–15)
BUN: 31 mg/dL — ABNORMAL HIGH (ref 8–23)
CO2: 30 mmol/L (ref 22–32)
Calcium: 9.4 mg/dL (ref 8.9–10.3)
Chloride: 97 mmol/L — ABNORMAL LOW (ref 98–111)
Creatinine, Ser: 1.13 mg/dL — ABNORMAL HIGH (ref 0.44–1.00)
GFR, Estimated: 51 mL/min — ABNORMAL LOW (ref >60)
Glucose, Bld: 218 mg/dL — ABNORMAL HIGH (ref 70–99)
Potassium: 3.9 mmol/L (ref 3.5–5.1)
Sodium: 138 mmol/L (ref 135–145)

## 2021-04-12 MED ORDER — FUROSEMIDE 10 MG/ML IJ SOLN
20.0000 mg | Freq: Two times a day (BID) | INTRAMUSCULAR | Status: DC
  Administered 2021-04-12: 20 mg via INTRAVENOUS
  Filled 2021-04-12 (×2): qty 2

## 2021-04-12 NOTE — Progress Notes (Signed)
? [name]  OIZ:[id number] DOB: 03/05/1945 DOA: 04/06/2021 ?PCP: [name], NP   ? ?Brief Narrative:  ?76 year old with a history of morbid obesity, chronic hypoxic respiratory failure on 6 L nasal cannula at baseline, chronic diastolic CHF, chronic venous stasis, DM2, HTN, HLD, and chronic anemia who was admitted to [facility] 3/13 > 04/02/2021 for AMS and AKI felt to be due to polypharmacy and who was returned to the ED from her SNF due to hypotension (64/42).  ? ?There was initial concern in the ED she was septic due to chronic B LE wounds, but on careful physical exam there was no evidence of an actual wound infection/cellulitis. While in the ED she was noted to be in Afib w/ RVR. This is a new finding for her.  ? ? ?Consultants:  ?cards ? ?Code Status: NO CODE BLUE ? ?DVT prophylaxis: ?eliquis ? ?Interim Hx: ?Says she has been urinating a lot ? ? ?Assessment & Plan: ? ?Newly diagnosed atrial fibrillation ?-Was not present last week during admission  ?-rate presently well controlled  ?-TSH normal ?-echo with preserved EF ?-change heparin to eliquis ?-cards consult appreciated: plan continue current medical therapy and anticoagulation for afib. Rate-controlled- would consider outpatient DCCV if she remains in afib in 3-4 weeks. ? ?Hypotension ?-appears resolved ?-will attempt diuresis with IV lasix- increase to BID ? ?Possible UTI ?Urinalysis at admission significantly abnormal -on empiric antibiotic, but will narrow spectrum ?-treated for 3 days ? ?Acute kidney injury ?Creatinine had improved to 1.48 at time of discharge 3/16 from a peak of 3.53 3/13 -not surprisingly with episodes of hypotension creatinine has increased again  ?-daily labs ?-watch closely while getting diuresis ? ?Hx of Pheochromocytoma s/p remote right adrenalectomy ?-cortisol normal ? ?Normocytic anemia of chronic disease ?Hemoglobin 8-8.5 during recent hospitalization -no evidence of acute blood loss -monitor trend in serial  fashion ? ?Multiple open leg wounds - chronic venous stasis bilateral lower extremities ?Was cared for by [facility] during prior admission with no evidence of active infection -still no evidence of active infection this admission -hold any further antibiotic therapy and continue wound care ?-unable to elevate extremity or tolerate UNNA boots which limits her healing ? ?Chronic pain syndrome ?Care with narcotics as patient was found to be high risk for medication induced obtundation during last admission -patient did not feel Lyrica was helping her neuropathic pain therefore it has been discontinued ?-does not tolerate Neurontin ? ?Chronic diastolic CHF ?Suspect lower extremity edema is primarily related to venous stasis/obesity/hypoalbuminemia -ability to diuresis greatly limited by hypotension and probable intravascular volume depletion- will do IV lasix  daily ? ?DM2 ?-SSI ? ?OSA on CPAP ?-does not wear ? ?Morbid obesity -  ?-Estimated body mass index is 58.93 kg/m? as calculated from the following: ?  Height as of this encounter: '5\' 2"'$  (1.575 m). ?  Weight as of this encounter: 146.1 kg.  ? ? ?Family Communication: son 3/23 ?Disposition: from SNF - anticipate need to return to SNF when medically stable  ? ?Objective: ?Blood pressure 132/78, pulse 100, temperature 98 ?F (36.7 ?C), temperature source Oral, resp. rate 20, height '5\' 2"'$  (1.575 m), weight (!) 146.1 kg, SpO2 100 %. ? ?Intake/Output Summary (Last 24 hours) at 04/12/2021 1330 ?Last data filed at 04/12/2021 0900 ?Gross per 24 hour  ?Intake 1320 ml  ?Output 2225 ml  ?Net -905 ml  ? ?Filed Weights  ? 04/07/21 0000 04/11/21 0741  ?Weight: (!) 153.5 kg (!) 146.1 kg  ? ? ?Examination: ? ? ?  General: Appearance:    Severely obese female in no acute distress  ?   ?Lungs:     Diminished, respirations unlabored  ?Heart:    Tachycardic.    ?MS:   All extremities are intact. + weeping edema in LE ?  ?Neurologic:   Awake, alert, oriented x 3. No apparent focal neurological            defect.   ?  ?  ?  ?  ? ?CBC: ?Recent Labs  ?Lab 04/06/21 ?2027 04/06/21 ?2033 04/10/21 ?0332 04/11/21 ?0251 04/12/21 ?0505  ?WBC 10.7*   < > 7.8 8.5 9.1  ?NEUTROABS 8.9*  --   --   --   --   ?HGB 7.9*   < > 8.4* 8.3* 9.0*  ?HCT 27.6*   < > 28.5* 28.4* 30.0*  ?MCV 94.2   < > 90.8 89.9 89.0  ?PLT 249   < > 240 255 225  ? < > = values in this interval not displayed.  ? ?Basic Metabolic Panel: ?Recent Labs  ?Lab 04/08/21 ?0428 04/10/21 ?0332 04/11/21 ?0251 04/12/21 ?0801  ?NA 140 141 139 138  ?K 3.8 4.0 3.8 3.9  ?CL 100 101 100 97*  ?CO2 '29 30 31 30  '$ ?GLUCOSE 263* 175* 179* 218*  ?BUN 48* 32* 34* 31*  ?CREATININE 1.45* 1.12* 1.20* 1.13*  ?CALCIUM 8.8* 9.5 9.6 9.4  ?MG 1.9  --   --   --   ? ?GFR: ?Estimated Creatinine Clearance: 61 mL/min (A) (by C-G formula based on SCr of 1.13 mg/dL (H)). ? ?Liver Function Tests: ?Recent Labs  ?Lab 04/06/21 ?2027 04/08/21 ?0428  ?AST 19 27  ?ALT 20 23  ?ALKPHOS 37* 40  ?BILITOT 0.4 0.3  ?PROT 6.5 6.8  ?ALBUMIN 3.0* 2.9*  ? ? ?HbA1C: ?Hgb A1c MFr Bld  ?Date/Time Value Ref Range Status  ?04/07/2021 12:06 AM 6.3 (H) 4.8 - 5.6 % Final  ?  Comment:  ?  (NOTE) ?Pre diabetes:          5.7%-6.4% ? ?Diabetes:              >6.4% ? ?Glycemic control for   <7.0% ?adults with diabetes ?  ?02/01/2021 03:25 AM 7.2 (H) 4.8 - 5.6 % Final  ?  Comment:  ?  (NOTE) ?Pre diabetes:          5.7%-6.4% ? ?Diabetes:              >6.4% ? ?Glycemic control for   <7.0% ?adults with diabetes ?  ? ? ?CBG: ?Recent Labs  ?Lab 04/11/21 ?1121 04/11/21 ?1619 04/11/21 ?2125 04/12/21 ?7829 04/12/21 ?1121  ?GLUCAP 218* 215* 214* 181* 259*  ? ? ?Scheduled Meds: ? apixaban  5 mg Oral BID  ? Chlorhexidine Gluconate Cloth  6 each Topical Q0600  ? ferrous sulfate  325 mg Oral Q breakfast  ? fluticasone furoate-vilanterol  1 puff Inhalation Daily  ? furosemide  20 mg Intravenous BID  ? insulin aspart  0-20 Units Subcutaneous TID WC  ? insulin aspart  0-5 Units Subcutaneous QHS  ? lidocaine  1 patch Transdermal Daily  ?  midodrine  5 mg Oral TID WC  ? mupirocin ointment  1 application. Nasal BID  ? rosuvastatin  20 mg Oral QHS  ? senna-docusate  1 tablet Oral BID  ? umeclidinium bromide  1 puff Inhalation Daily  ? ?Continuous Infusions: ? ? ? ? LOS: 5 days  ? ?Eulogio Bear DO ?[facility] ? ? ?  If 7PM-7AM, please contact night-coverage per Amion ?04/12/2021, 1:30 PM ? ? ? ?

## 2021-04-12 NOTE — Progress Notes (Signed)
RT note: ?Patient states her home CPAP is not working and does nor wish to wear a hospital CPAP. States she will wait to go home and have her replaced or fixed. ?

## 2021-04-13 ENCOUNTER — Encounter (HOSPITAL_BASED_OUTPATIENT_CLINIC_OR_DEPARTMENT_OTHER): Payer: Medicare Other | Admitting: General Surgery

## 2021-04-13 DIAGNOSIS — E1159 Type 2 diabetes mellitus with other circulatory complications: Secondary | ICD-10-CM | POA: Diagnosis not present

## 2021-04-13 DIAGNOSIS — I959 Hypotension, unspecified: Secondary | ICD-10-CM | POA: Diagnosis not present

## 2021-04-13 DIAGNOSIS — N179 Acute kidney failure, unspecified: Secondary | ICD-10-CM | POA: Diagnosis not present

## 2021-04-13 LAB — GLUCOSE, CAPILLARY
Glucose-Capillary: 193 mg/dL — ABNORMAL HIGH (ref 70–99)
Glucose-Capillary: 276 mg/dL — ABNORMAL HIGH (ref 70–99)
Glucose-Capillary: 217 mg/dL — ABNORMAL HIGH (ref 70–99)
Glucose-Capillary: 255 mg/dL — ABNORMAL HIGH (ref 70–99)

## 2021-04-13 LAB — BASIC METABOLIC PANEL
Anion gap: 10 (ref 5–15)
BUN: 31 mg/dL — ABNORMAL HIGH (ref 8–23)
CO2: 29 mmol/L (ref 22–32)
Calcium: 9.3 mg/dL (ref 8.9–10.3)
Chloride: 99 mmol/L (ref 98–111)
Creatinine, Ser: 1.25 mg/dL — ABNORMAL HIGH (ref 0.44–1.00)
GFR, Estimated: 45 mL/min — ABNORMAL LOW (ref >60)
Glucose, Bld: 196 mg/dL — ABNORMAL HIGH (ref 70–99)
Potassium: 3.9 mmol/L (ref 3.5–5.1)
Sodium: 138 mmol/L (ref 135–145)

## 2021-04-13 LAB — CBC
HCT: 29.5 % — ABNORMAL LOW (ref 36.0–46.0)
Hemoglobin: 8.8 g/dL — ABNORMAL LOW (ref 12.0–15.0)
MCH: 26.9 pg (ref 26.0–34.0)
MCHC: 29.8 g/dL — ABNORMAL LOW (ref 30.0–36.0)
MCV: 90.2 fL (ref 80.0–100.0)
Platelets: 218 10*3/uL (ref 150–400)
RBC: 3.27 MIL/uL — ABNORMAL LOW (ref 3.87–5.11)
RDW: 16.9 % — ABNORMAL HIGH (ref 11.5–15.5)
WBC: 10.3 10*3/uL (ref 4.0–10.5)
nRBC: 0 % (ref 0.0–0.2)

## 2021-04-13 MED ORDER — FERROUS SULFATE 325 (65 FE) MG PO TABS: 325.0000 mg | ORAL_TABLET | ORAL | Status: DC

## 2021-04-13 MED ORDER — HYDROCODONE-ACETAMINOPHEN 5-325 MG PO TABS
1.0000 | ORAL_TABLET | ORAL | Status: DC | PRN
  Administered 2021-04-13 – 2021-04-17 (×19): 2 via ORAL
  Filled 2021-04-13 (×20): qty 2

## 2021-04-13 MED ORDER — INSULIN ASPART 100 UNIT/ML IJ SOLN
5.0000 [IU] | Freq: Three times a day (TID) | INTRAMUSCULAR | Status: DC
  Administered 2021-04-13 – 2021-04-16 (×8): 5 [IU] via SUBCUTANEOUS

## 2021-04-13 MED ORDER — KETOROLAC TROMETHAMINE 15 MG/ML IJ SOLN
15.0000 mg | Freq: Once | INTRAMUSCULAR | Status: AC
  Administered 2021-04-13: 15 mg via INTRAVENOUS
  Filled 2021-04-13: qty 1

## 2021-04-13 MED ORDER — TORSEMIDE 20 MG PO TABS
40.0000 mg | ORAL_TABLET | Freq: Every day | ORAL | Status: DC
  Administered 2021-04-14 – 2021-04-17 (×4): 40 mg via ORAL
  Filled 2021-04-13 (×4): qty 2

## 2021-04-13 MED ORDER — METHOCARBAMOL 500 MG PO TABS
500.0000 mg | ORAL_TABLET | Freq: Four times a day (QID) | ORAL | Status: DC | PRN
  Administered 2021-04-14: 500 mg via ORAL
  Filled 2021-04-13 (×2): qty 1

## 2021-04-13 NOTE — Progress Notes (Signed)
Inpatient Diabetes Program Recommendations ? ?AACE/ADA: New Consensus Statement on Inpatient Glycemic Control (2015) ? ?Target Ranges:  Prepandial:   less than 140 mg/dL ?     Peak postprandial:   less than 180 mg/dL (1-2 hours) ?     Critically ill patients:  140 - 180 mg/dL  ? ?Lab Results  ?Component Value Date  ? GLUCAP 276 (H) 04/13/2021  ? HGBA1C 6.3 (H) 04/07/2021  ? ? ?Review of Glycemic Control ? Latest Reference Range & Units 04/12/21 07:13 04/12/21 11:21 04/12/21 17:00 04/12/21 21:12 04/13/21 07:46 04/13/21 11:19  ?Glucose-Capillary 70 - 99 mg/dL 181 (H) 259 (H) 188 (H) 221 (H) 193 (H) 276 (H)  ?(H): Data is abnormally high ? ?Diabetes history: DM2 ?Outpatient Diabetes medications: 70/30 60 units QAM & 73 QPM ?Current orders for Inpatient glycemic control: Novolog 0-20 units TID ? ?Inpatient Diabetes Program Recommendations:   ? ?Novolog 3 units TID with meals if eats at least 50% ? ?Will continue to follow while inpatient. ? ?Thank you, ?Reche Dixon, MSN, RN ?Diabetes Coordinator ?Inpatient Diabetes Program ?[phone] (team pager from 8a-5p) ? ? ? ?

## 2021-04-13 NOTE — Consult Note (Signed)
? ?  [facility] CM Inpatient Consult ? ? ?04/13/2021 ? ?[name] ?10-15-1945 ?[id number] ? ?[facility] [facility] [ACO] Patient: Medicare ACO Reach ? ?Primary Care Provider:  [name], NP at [facility],  ? ?Patient was reviewed for high risk score for unplanned readmission risk.  Patient is noted to be a long term resident at [facility] per inpatient LCSW notes. ? ?Plan:  Patient needs are to be met for post hospital with the return to skilled nursing facility care as a resident. ? ?For changes, questions or referrals, please contact: ? ? ?Natividad Brood, RN BSN CCM ?[facility] Liaison ? [phone] business mobile phone ?Toll free office [phone]  ?Fax number: [phone] ?[email or web address] ?[email or web address] ? ?  ? ?

## 2021-04-13 NOTE — Progress Notes (Signed)
Physical Therapy Treatment ?Patient Details ?Name: [name] ?MRN: [id number] ?DOB: 11/20/1945 ?Today's Date: 04/13/2021 ? ? ?History of Present Illness 76 year old admitted 3/20 to the ED from her SNF due to hypotension (64/42).  Recently admitted to [facility] 3/13 > 04/02/2021 for AMS and AKI felt to be due to polypharmacy. PMH:  morbid obesity, chronic hypoxic respiratory failure on 6 L nasal cannula at baseline, chronic diastolic CHF, chronic venous stasis, DM2, HTN, HLD, and chronic anemia ? ?  ?PT Comments  ? ? Pt on 4L continuous O2. Pt required min guard assist transfers. Able to stand statically x 30 seconds. Mobility limited by LLE pain. Pt performed LE exercises seated in recliner. Pt remained in recliner at end of session.   ?  ?Recommendations for follow up therapy are one component of a multi-disciplinary discharge planning process, led by the attending physician.  Recommendations may be updated based on patient status, additional functional criteria and insurance authorization. ? ?Follow Up Recommendations ? Skilled nursing-short term rehab (<3 hours/day) ?  ?  ?Assistance Recommended at Discharge Frequent or constant Supervision/Assistance  ?Patient can return home with the following A little help with walking and/or transfers;A little help with bathing/dressing/bathroom ?  ?Equipment Recommendations ? None recommended by PT  ?  ?Recommendations for Other Services   ? ? ?  ?Precautions / Restrictions Precautions ?Precautions: Fall;Other (comment) ?Precaution Comments: LLE wound, 02, watch BP  ?  ? ?Mobility ? Bed Mobility ?  ?  ?  ?  ?  ?  ?  ?General bed mobility comments: Pt up in recliner. ?  ? ?Transfers ?Overall transfer level: Needs assistance ?Equipment used: None ?Transfers: Sit to/from Stand, Bed to chair/wheelchair/BSC ?Sit to Stand: Min guard ?  ?Step pivot transfers: Min guard ?  ?  ?  ?General transfer comment: Pt using bedrail for support. ?  ? ?Ambulation/Gait ?  ?  ?  ?  ?  ?  ?  ?General  Gait Details: declining due to pain and feeling lightheaded after pain meds ? ? ?Stairs ?  ?  ?  ?  ?  ? ? ?Wheelchair Mobility ?  ? ?Modified Rankin (Stroke Patients Only) ?  ? ? ?  ?Balance Overall balance assessment: Needs assistance ?Sitting-balance support: No upper extremity supported, Feet supported ?Sitting balance-Leahy Scale: Fair ?  ?  ?Standing balance support: Single extremity supported, During functional activity, Bilateral upper extremity supported ?Standing balance-Leahy Scale: Poor ?  ?  ?  ?  ?  ?  ?  ?  ?  ?  ?  ?  ?  ? ?  ?Cognition Arousal/Alertness: Awake/alert ?Behavior During Therapy: [facility] for tasks assessed/performed ?Overall Cognitive Status: Within Functional Limits for tasks assessed ?  ?  ?  ?  ?  ?  ?  ?  ?  ?  ?  ?  ?  ?  ?  ?  ?General Comments: hyperverbose ?  ?  ? ?  ?Exercises General Exercises - Lower Extremity ?Ankle Circles/Pumps: AROM, Both, 10 reps, Seated ?Long Arc Quad: AROM, Right, Left, 10 reps, Seated ?Hip Flexion/Marching: AROM, Right, Left, 10 reps, Seated ? ?  ?General Comments General comments (skin integrity, edema, etc.): VSS on 4L O2 ?  ?  ? ?Pertinent Vitals/Pain Pain Assessment ?Pain Assessment: Faces ?Faces Pain Scale: Hurts little more ?Pain Location: L thigh ?Pain Descriptors / Indicators: Shooting, Grimacing, Guarding, Discomfort ?Pain Intervention(s): Limited activity within patient's tolerance, Monitored during session, Premedicated before session  ? ? ?Home  Living   ?  ?  ?  ?  ?  ?  ?  ?  ?  ?   ?  ?Prior Function    ?  ?  ?   ? ?PT Goals (current goals can now be found in the care plan section) Acute Rehab PT Goals ?Patient Stated Goal: [facility] SN[facility] then ALF ?Progress towards PT goals: Progressing toward goals ? ?  ?Frequency ? ? ? Min 2X/week ? ? ? ?  ?PT Plan Current plan remains appropriate  ? ? ?Co-evaluation   ?  ?  ?  ?  ? ?  ?AM-PAC PT "6 Clicks" Mobility   ?Outcome Measure ? Help needed turning from your back to your side while in a flat bed  without using bedrails?: A Little ?Help needed moving from lying on your back to sitting on the side of a flat bed without using bedrails?: A Little ?Help needed moving to and from a bed to a chair (including a wheelchair)?: A Little ?Help needed standing up from a chair using your arms (e.g., wheelchair or bedside chair)?: A Little ?Help needed to walk in hospital room?: A Lot ?Help needed climbing 3-5 steps with a railing? : Total ?6 Click Score: 15 ? ?  ?End of Session Equipment Utilized During Treatment: Oxygen ?Activity Tolerance: Patient limited by fatigue;Patient limited by pain ?Patient left: in chair;with call bell/phone within reach ?Nurse Communication: Mobility status ?PT Visit Diagnosis: Pain;Muscle weakness (generalized) (M62.81);Difficulty in walking, not elsewhere classified (R26.2) ?Pain - Right/Left: Left ?Pain - part of body: Leg ?  ? ? ?Time: 1000-1023 ?PT Time Calculation (min) (ACUTE ONLY): 23 min ? ?Charges:  $Therapeutic Exercise: 8-22 mins ?$Therapeutic Activity: 8-22 mins          ?          ? ?Lorrin Goodell, PT  ?Office # [phone] ?Pager [phone] ? ? ? ?Lorriane Shire ?04/13/2021, 12:06 PM ? ?

## 2021-04-13 NOTE — Discharge Instructions (Signed)

## 2021-04-13 NOTE — Progress Notes (Signed)
Patient transferred from chair to standing scale to [facility] on 4L nasal cannula. When patient sat down on the [facility], she stated "I'm lightheaded because that doctor turned down my oxygen." This nurse assess and reassured patient that she was still on 4L and the doctor (Dr. Eliseo Squires) placed the order to attempt to wean O2 to 2L. Patient verbalized understanding.  ?

## 2021-04-13 NOTE — Progress Notes (Signed)
?   04/12/21 2352  ?Pain Assessment  ?Pain Scale 0-10  ?Pain Score 10  ?Pain Type Chronic pain  ?Pain Location Leg  ?Pain Orientation Left  ?Pain Descriptors / Indicators Sharp  ?Pain Frequency Constant  ?Pain Onset On-going  ?Pain Intervention(s) MD notified (Comment);Emotional support  ?Provider Notification  ?Provider Name/Title Dr. Nevada Crane  ?Date Provider Notified 04/12/21  ?Time Provider Notified 2352  ?Notification Type Page  ?Notification Reason Change in status  ?Provider response See new orders  ? ?Patient is c/o of consistent severe pain to bilateral lower extremities.  She states tonight that the left leg is having sharp pain anteriorly.  Norco is not relieving the pain.  Dr. Nevada Crane called and made aware.  Order for Toradol received and given to patient.  Patient is now asleep and resting comfortably in the bariatric chair.  Will continue to monitor.  Earleen Reaper RN ?

## 2021-04-13 NOTE — Progress Notes (Signed)
? [name]  WUJ:[id number] DOB: 09-May-1945 DOA: 04/06/2021 ?PCP: [name], NP   ? ?Brief Narrative:  ?76 year old with a history of morbid obesity, chronic hypoxic respiratory failure on 6 L nasal cannula at baseline, chronic diastolic CHF, chronic venous stasis, DM2, HTN, HLD, and chronic anemia who was admitted to [facility] 3/13 > 04/02/2021 for AMS and AKI felt to be due to polypharmacy and who was returned to the ED from her SNF due to hypotension (64/42).  ? ?There was initial concern in the ED she was septic due to chronic B LE wounds, but on careful physical exam there was no evidence of an actual wound infection/cellulitis. While in the ED she was noted to be in Afib w/ RVR. This is a new finding for her.  ?Slow to improve with diuresis ? ? ?Consultants:  ?cards ? ?Code Status: NO CODE BLUE ? ?DVT prophylaxis: ?eliquis ? ?Interim Hx: ?C/o dizziness as she thought I lowered her O2 BEFORE the O2 was weaned ?Multiple other complaints about nursing ? ? ?Assessment & Plan: ? ?Newly diagnosed atrial fibrillation ?-Was not present last week during admission  ?-rate presently well controlled  ?-TSH normal ?-echo with preserved EF ?-change heparin to eliquis ?-cards consult appreciated: plan continue current medical therapy and anticoagulation for afib. Rate-controlled- would consider outpatient DCCV if she remains in afib in 3-4 weeks. ?-diuresing with IV lasix -- change to PO in AM ?-weight down and negative I/Os ? ?Hypotension ?-appears resolved ? ?Possible UTI ?Urinalysis at admission significantly abnormal -on empiric antibiotic, but will narrow spectrum ?-treated for 3 days ? ?Acute kidney injury ?Creatinine had improved to 1.48 at time of discharge 3/16 from a peak of 3.53 3/13 -not surprisingly with episodes of hypotension creatinine has increased again  ?-daily labs ?-watch closely while getting diuresis ? ?Hx of Pheochromocytoma s/p remote right adrenalectomy ?-cortisol normal ? ?Normocytic anemia of  chronic disease ?Hemoglobin 8-8.5 during recent hospitalization -no evidence of acute blood loss -monitor trend in serial fashion ? ?Multiple open leg wounds - chronic venous stasis bilateral lower extremities ?Was cared for by [facility] during prior admission with no evidence of active infection -still no evidence of active infection this admission -hold any further antibiotic therapy and continue wound care ?-unable to elevate extremity or tolerate UNNA boots which limits her healing ? ?Chronic pain syndrome ?Care with narcotics as patient was found to be high risk for medication induced obtundation during last admission -patient did not feel Lyrica was helping her neuropathic pain therefore it has been discontinued ?-does not tolerate Neurontin ? ?Acute on Chronic diastolic CHF ?Suspect lower extremity edema is primarily related to venous stasis/obesity/hypoalbuminemia -ability to diuresis greatly limited by hypotension and probable intravascular volume depletion ?-see above re: diuresis ? ?DM2 ?-SSI ? ?OSA on CPAP ?-does not wear ? ?Morbid obesity -  ?-Estimated body mass index is 58.78 kg/m? as calculated from the following: ?  Height as of this encounter: '5\' 2"'$  (1.575 m). ?  Weight as of this encounter: 145.8 kg.  ? ? ?Family Communication: son 3/25- LM ?Disposition: from SNF - anticipate need to return to SNF when medically stable  ? ?Objective: ?Blood pressure 130/81, pulse 94, temperature 98.1 ?F (36.7 ?C), temperature source Oral, resp. rate 17, height '5\' 2"'$  (1.575 m), weight (!) 145.8 kg, SpO2 100 %. ? ?Intake/Output Summary (Last 24 hours) at 04/13/2021 1316 ?Last data filed at 04/13/2021 0900 ?Gross per 24 hour  ?Intake 1540 ml  ?Output 1125 ml  ?Net 415  ml  ? ?Filed Weights  ? 04/07/21 0000 04/11/21 0741 04/13/21 0910  ?Weight: (!) 153.5 kg (!) 146.1 kg (!) 145.8 kg  ? ? ?Examination: ? ? ?General: Appearance:    Severely obese female in no acute distress  ?   ?Lungs:     Diminished, respirations unlabored   ?Heart:    Normal heart rate.  irregular  ?MS:   All extremities are intact. + weeping edema in LE but improved swelling ?  ?Neurologic:   Awake, alert  ?  ?  ?  ?  ? ?CBC: ?Recent Labs  ?Lab 04/06/21 ?2027 04/06/21 ?2033 04/11/21 ?0251 04/12/21 ?0505 04/13/21 ?6073  ?WBC 10.7*   < > 8.5 9.1 10.3  ?NEUTROABS 8.9*  --   --   --   --   ?HGB 7.9*   < > 8.3* 9.0* 8.8*  ?HCT 27.6*   < > 28.4* 30.0* 29.5*  ?MCV 94.2   < > 89.9 89.0 90.2  ?PLT 249   < > 255 225 218  ? < > = values in this interval not displayed.  ? ?Basic Metabolic Panel: ?Recent Labs  ?Lab 04/08/21 ?7106 04/10/21 ?2694 04/11/21 ?0251 04/12/21 ?0801 04/13/21 ?8546  ?NA 140   < > 139 138 138  ?K 3.8   < > 3.8 3.9 3.9  ?CL 100   < > 100 97* 99  ?CO2 29   < > '31 30 29  '$ ?GLUCOSE 263*   < > 179* 218* 196*  ?BUN 48*   < > 34* 31* 31*  ?CREATININE 1.45*   < > 1.20* 1.13* 1.25*  ?CALCIUM 8.8*   < > 9.6 9.4 9.3  ?MG 1.9  --   --   --   --   ? < > = values in this interval not displayed.  ? ?GFR: ?Estimated Creatinine Clearance: 55.1 mL/min (A) (by C-G formula based on SCr of 1.25 mg/dL (H)). ? ?Liver Function Tests: ?Recent Labs  ?Lab 04/06/21 ?2027 04/08/21 ?0428  ?AST 19 27  ?ALT 20 23  ?ALKPHOS 37* 40  ?BILITOT 0.4 0.3  ?PROT 6.5 6.8  ?ALBUMIN 3.0* 2.9*  ? ? ?HbA1C: ?Hgb A1c MFr Bld  ?Date/Time Value Ref Range Status  ?04/07/2021 12:06 AM 6.3 (H) 4.8 - 5.6 % Final  ?  Comment:  ?  (NOTE) ?Pre diabetes:          5.7%-6.4% ? ?Diabetes:              >6.4% ? ?Glycemic control for   <7.0% ?adults with diabetes ?  ?02/01/2021 03:25 AM 7.2 (H) 4.8 - 5.6 % Final  ?  Comment:  ?  (NOTE) ?Pre diabetes:          5.7%-6.4% ? ?Diabetes:              >6.4% ? ?Glycemic control for   <7.0% ?adults with diabetes ?  ? ? ?CBG: ?Recent Labs  ?Lab 04/12/21 ?1121 04/12/21 ?1700 04/12/21 ?2112 04/13/21 ?0746 04/13/21 ?1119  ?GLUCAP 259* 188* 221* 193* 276*  ? ? ?Scheduled Meds: ? apixaban  5 mg Oral BID  ? Chlorhexidine Gluconate Cloth  6 each Topical Q0600  ? [START ON 04/19/2021]  ferrous sulfate  325 mg Oral Weekly  ? fluticasone furoate-vilanterol  1 puff Inhalation Daily  ? insulin aspart  0-20 Units Subcutaneous TID WC  ? insulin aspart  0-5 Units Subcutaneous QHS  ? lidocaine  1 patch Transdermal Daily  ? mupirocin ointment  1 application. Nasal BID  ? rosuvastatin  20 mg Oral QHS  ? senna-docusate  1 tablet Oral BID  ? [START ON 04/14/2021] torsemide  40 mg Oral Daily  ? umeclidinium bromide  1 puff Inhalation Daily  ? ?Continuous Infusions: ? ? ? ? LOS: 6 days  ? ?Eulogio Bear DO ?[facility] ? ? ?If 7PM-7AM, please contact night-coverage per Amion ?04/13/2021, 1:16 PM ? ? ? ?

## 2021-04-13 NOTE — Progress Notes (Signed)
Pt has home cpap but it is not working.  She does not want to use ours.  Pt knows to call of she changes her mind. RT will monitor. ?

## 2021-04-14 DIAGNOSIS — N179 Acute kidney failure, unspecified: Secondary | ICD-10-CM | POA: Diagnosis not present

## 2021-04-14 LAB — BASIC METABOLIC PANEL
Anion gap: 11 (ref 5–15)
BUN: 29 mg/dL — ABNORMAL HIGH (ref 8–23)
CO2: 31 mmol/L (ref 22–32)
Calcium: 9.3 mg/dL (ref 8.9–10.3)
Chloride: 97 mmol/L — ABNORMAL LOW (ref 98–111)
Creatinine, Ser: 0.97 mg/dL (ref 0.44–1.00)
GFR, Estimated: >60 mL/min (ref >60)
Glucose, Bld: 176 mg/dL — ABNORMAL HIGH (ref 70–99)
Potassium: 3.8 mmol/L (ref 3.5–5.1)
Sodium: 139 mmol/L (ref 135–145)

## 2021-04-14 LAB — GLUCOSE, CAPILLARY
Glucose-Capillary: 176 mg/dL — ABNORMAL HIGH (ref 70–99)
Glucose-Capillary: 281 mg/dL — ABNORMAL HIGH (ref 70–99)
Glucose-Capillary: 300 mg/dL — ABNORMAL HIGH (ref 70–99)
Glucose-Capillary: 273 mg/dL — ABNORMAL HIGH (ref 70–99)

## 2021-04-14 MED ORDER — INSULIN GLARGINE-YFGN 100 UNIT/ML ~~LOC~~ SOLN
8.0000 [IU] | Freq: Every day | SUBCUTANEOUS | Status: DC
  Administered 2021-04-14 – 2021-04-15 (×2): 8 [IU] via SUBCUTANEOUS
  Filled 2021-04-14 (×2): qty 0.08

## 2021-04-14 MED ORDER — BISACODYL 10 MG RE SUPP
10.0000 mg | Freq: Once | RECTAL | Status: AC
  Administered 2021-04-14: 10 mg via RECTAL
  Filled 2021-04-14: qty 1

## 2021-04-14 MED ORDER — SORBITOL 70 % SOLN
300.0000 mL | TOPICAL_OIL | ORAL | Status: AC
  Filled 2021-04-14: qty 90

## 2021-04-14 MED ORDER — SENNA 8.6 MG PO TABS: 2.0000 | ORAL_TABLET | Freq: Two times a day (BID) | ORAL | Status: DC

## 2021-04-14 NOTE — Progress Notes (Signed)
Occupational Therapy Treatment ?Patient Details ?Name: [name] ?MRN: [id number] ?DOB: 21-Sep-1945 ?Today's Date: 04/14/2021 ? ? ?History of present illness 76 year old admitted 3/20 to the ED from her SNF due to hypotension (64/42).  Recently admitted to [facility] 3/13 > 04/02/2021 for AMS and AKI felt to be due to polypharmacy. PMH:  morbid obesity, chronic hypoxic respiratory failure on 6 L nasal cannula at baseline, chronic diastolic CHF, chronic venous stasis, DM2, HTN, HLD, and chronic anemia ?  ?OT comments ? Pt progressing towards acute OT goals. Focus of session was establishing HEP for seated UB strengthening exercises using level 1 theraband. Pt reports 8/10 BLE pain, premedicated; declined standing. Discussed seated pressure relief and pre-standing exercises towards building activity tolerance during basic ADLs. D/c recommendation remains appropriate.   ? ?Recommendations for follow up therapy are one component of a multi-disciplinary discharge planning process, led by the attending physician.  Recommendations may be updated based on patient status, additional functional criteria and insurance authorization. ?   ?Follow Up Recommendations ? Skilled nursing-short term rehab (<3 hours/day)  ?  ?Assistance Recommended at Discharge Frequent or constant Supervision/Assistance  ?Patient can return home with the following ? A little help with walking and/or transfers;A lot of help with bathing/dressing/bathroom ?  ?Equipment Recommendations ? None recommended by OT  ?  ?Recommendations for Other Services   ? ?  ?Precautions / Restrictions Precautions ?Precautions: Fall;Other (comment) ?Precaution Comments: LLE wound, 02, watch BP ?Restrictions ?Weight Bearing Restrictions: No  ? ? ?  ? ?Mobility Bed Mobility ?  ?  ?  ?  ?  ?  ?  ?General bed mobility comments: Pt up in recliner. Pt reports she does not use bed, sleeps in recliner at home ?  ? ?Transfers ?  ?  ?  ?  ?  ?  ?  ?  ?  ?General transfer comment: pt  declined. 8/10 BLE pain, premedicated. ?  ?  ?Balance Overall balance assessment: Needs assistance ?  ?Sitting balance-Leahy Scale: Fair ?  ?  ?  ?  ?  ?  ?  ?  ?  ?  ?  ?  ?  ?  ?  ?  ?   ? ?ADL either performed or assessed with clinical judgement  ? ?ADL Overall ADL's : Needs assistance/impaired ?  ?  ?  ?  ?  ?  ?  ?  ?  ?  ?  ?  ?  ?  ?  ?  ?  ?  ?  ?General ADL Comments: Pt reporting L LE soreness/weeping and coordination deficits when stepping, able to perform BSC transfer with bedrail turning back to recliner. ?  ? ?Extremity/Trunk Assessment Upper Extremity Assessment ?Upper Extremity Assessment: RUE deficits/detail;LUE deficits/detail ?RUE Deficits / Details: strength 3+/5; reports difficulty reaching behind back for hygiene, pulling up pants and washing back ?LUE Deficits / Details: strength 3+/5; reports difficulty reaching behind back for hygiene, pulling up pants and washing back ?  ?Lower Extremity Assessment ?Lower Extremity Assessment: Defer to PT evaluation ?  ?  ?  ? ?Vision   ?  ?  ?Perception   ?  ?Praxis   ?  ? ?Cognition Arousal/Alertness: Awake/alert ?Behavior During Therapy: [facility] for tasks assessed/performed ?Overall Cognitive Status: Within Functional Limits for tasks assessed ?  ?  ?  ?  ?  ?  ?  ?  ?  ?  ?  ?  ?  ?  ?  ?  ?General Comments:  hyperverbose ?  ?  ?   ?Exercises Exercises: Other exercises ?Other Exercises ?Other Exercises: provided level one theraband and printout of HEP for seated UB exercises. Pt completed 10 reps each exercise. ? ?  ?Shoulder Instructions   ? ? ?  ?General Comments    ? ? ?Pertinent Vitals/ Pain       Pain Assessment ?Pain Assessment: 0-10 ?Pain Score: 8  ?Pain Location: BLE ?Pain Descriptors / Indicators: Shooting, Grimacing, Guarding, Discomfort ?Pain Intervention(s): Premedicated before session, Monitored during session, Limited activity within patient's tolerance ? ?Home Living   ?  ?  ?  ?  ?  ?  ?  ?  ?  ?  ?  ?  ?  ?  ?  ?  ?  ?  ? ?  ?Prior  Functioning/Environment    ?  ?  ?  ?   ? ?Frequency ? Min 2X/week  ? ? ? ? ?  ?Progress Toward Goals ? ?OT Goals(current goals can now be found in the care plan section) ? Progress towards OT goals: Progressing toward goals ? ?Acute Rehab OT Goals ?Patient Stated Goal: improve strength ?OT Goal Formulation: With patient ?Time For Goal Achievement: 04/24/21 ?Potential to Achieve Goals: Good ?ADL Goals ?Pt Will Perform Lower Body Bathing: with set-up;sitting/lateral leans;sit to/from stand;with adaptive equipment ?Pt Will Perform Lower Body Dressing: with min assist;with adaptive equipment;sit to/from stand ?Pt Will Transfer to Toilet: with supervision;ambulating ?Pt Will Perform Toileting - Clothing Manipulation and hygiene: with min assist;sit to/from stand;sitting/lateral leans;with adaptive equipment ?Pt/caregiver will Perform Home Exercise Program: Increased strength;Increased ROM;Both right and left upper extremity;With written HEP provided ?Additional ADL Goal #1: Pt to increase standing activity tolerance > 7 min during functional tasks to maximize overall endurance  ?Plan Discharge plan remains appropriate   ? ?Co-evaluation ? ? ?   ?  ?  ?  ?  ? ?  ?AM-PAC OT "6 Clicks" Daily Activity     ?Outcome Measure ? ? Help from another person eating meals?: None ?Help from another person taking care of personal grooming?: None ?Help from another person toileting, which includes using toliet, bedpan, or urinal?: A Lot ?Help from another person bathing (including washing, rinsing, drying)?: A Lot ?Help from another person to put on and taking off regular upper body clothing?: A Little ?Help from another person to put on and taking off regular lower body clothing?: A Lot ?6 Click Score: 17 ? ?  ?End of Session Equipment Utilized During Treatment: Oxygen ? ?OT Visit Diagnosis: Unsteadiness on feet (R26.81);Other abnormalities of gait and mobility (R26.89);Muscle weakness (generalized) (M62.81) ?  ?Activity Tolerance  Patient limited by pain;Patient tolerated treatment well ?  ?Patient Left in chair;with call bell/phone within reach ?  ?Nurse Communication   ?  ? ?   ? ?Time: 3845-3646 ?OT Time Calculation (min): 25 min ? ?Charges: OT General Charges ?$OT Visit: 1 Visit ?OT Treatments ?$Therapeutic Activity: 8-22 mins ?$Therapeutic Exercise: 8-22 mins ? ?Tyrone Schimke, OT ?Acute Rehabilitation Services ?Office: [phone] ? ?04/14/2021, 12:12 PM ?

## 2021-04-14 NOTE — Plan of Care (Signed)
  Problem: Clinical Measurements: Goal: Diagnostic test results will improve Outcome: Progressing   

## 2021-04-14 NOTE — Progress Notes (Signed)
Patient refused cpap. She has her own home unit, but it is broken and not working properly.  She refused any other equipment. ?

## 2021-04-14 NOTE — Progress Notes (Addendum)
? Maezie Justin  HCW:[id number] DOB: [birth year] DOA: 04/06/2021 ?PCP: [name], NP   ? ?Brief Narrative:  ?76 year old with a history of morbid obesity, chronic hypoxic respiratory failure on 6 L nasal cannula at baseline, chronic diastolic CHF, chronic venous stasis, DM2, HTN, HLD, and chronic anemia who was admitted to [facility] 3/13 > 04/02/2021 for AMS and AKI felt to be due to polypharmacy and who was returned to the ED from her SNF due to hypotension (64/42).  ? ?There was initial concern in the ED she was septic due to chronic B LE wounds, but on careful physical exam there was no evidence of an actual wound infection/cellulitis. While in the ED she was noted to be in Afib w/ RVR. This is a new finding for her.  ?Slow to improve with diuresis.  Multiple complaints.   ? ? ?Consultants:  ?cards ? ?Code Status: NO CODE BLUE ? ?DVT prophylaxis: ?eliquis ? ?Interim Hx: ?Continues with multiple complaints - nursing care, [place] ?Admits she can move her legs better and they are less swollen ? ? ?Assessment & Plan: ? ?Newly diagnosed atrial fibrillation ?-rate presently well controlled  ?-TSH normal ?-echo with preserved EF ?-changed heparin to eliquis ?-cards consult appreciated: plan continue current medical therapy and anticoagulation for afib. Rate-controlled- would consider outpatient DCCV if she remains in afib in 3-4 weeks. ?-? Allergy to metoprolol ?-diuresing with IV lasix -- changed to PO on 3/28 ?-weight down and negative I/Os- weight close to 2021 low weight ?-BMP in AM ?-d/c foley in AM ? ?Hypotension ?-appears resolved ? ?Possible UTI ?Urinalysis at admission significantly abnormal -on empiric antibiotic, but will narrow spectrum ?-treated for 3 days ? ?Acute kidney injury ?-improved from prior ?-daily labs ?-watch closely while getting diuresis ? ?Hx of Pheochromocytoma s/p remote right adrenalectomy ?-cortisol normal ? ?Normocytic anemia of chronic disease ?Hemoglobin 8-8.5 during recent  hospitalization -no evidence of acute blood loss -monitor trend in serial fashion ? ?Multiple open leg wounds - chronic venous stasis bilateral lower extremities ?Was cared for by [facility] during prior admission with no evidence of active infection -still no evidence of active infection this admission -hold any further antibiotic therapy and continue wound care daily and PRN ?-unable to elevate extremity or tolerate UNNA boots which limits her healing ? ?Chronic pain syndrome ?Care with narcotics as patient was found to be high risk for medication induced obtundation during last admission -patient did not feel Lyrica was helping her neuropathic pain therefore it has been discontinued ?-does not tolerate Neurontin ? ?Acute on Chronic diastolic CHF ?Suspect lower extremity edema is primarily related to venous stasis/obesity/hypoalbuminemia -ability to diuresis greatly limited by hypotension and probable intravascular volume depletion ?-see above re: diuresis ? ?DM2 ?-SSI ? ?OSA on CPAP ?-does not wear ? ?Morbid obesity -  ?-Estimated body mass index is 58.78 kg/m? as calculated from the following: ?  Height as of this encounter: '5\' 2"'$  (1.575 m). ?  Weight as of this encounter: 145.8 kg.  ? ? ?Family Communication: son 3/25- LM ?Disposition: from SNF - anticipate need to return to SNF when medically stable - 24- 48 hours if Cr stable on demadex dose ? ?Objective: ?Blood pressure 122/71, pulse (!) 102, temperature 98 ?F (36.7 ?C), temperature source Oral, resp. rate 17, height '5\' 2"'$  (1.575 m), weight (!) 145.8 kg, SpO2 98 %. ? ?Intake/Output Summary (Last 24 hours) at 04/14/2021 1334 ?Last data filed at 04/14/2021 1000 ?Gross per 24 hour  ?Intake 1040 ml  ?Output 735  ml  ?Net 305 ml  ? ?Filed Weights  ? 04/07/21 0000 04/11/21 0741 04/13/21 0910  ?Weight: (!) 153.5 kg (!) 146.1 kg (!) 145.8 kg  ? ? ?Examination: ? ? ?General: Appearance:    Severely obese female in no acute distress  ?   ?Lungs:     Weaned from 6L to 2L,   respirations unlabored  ?Heart:    Irregular around 90.   ?MS:   All extremities are intact. B/l legs wrapped to knees ?  ?Neurologic:   Awake, alert, oriented x 3. anxious  ?  ?  ?  ?  ?  ? ?CBC: ?Recent Labs  ?Lab 04/11/21 ?0251 04/12/21 ?0505 04/13/21 ?9767  ?WBC 8.5 9.1 10.3  ?HGB 8.3* 9.0* 8.8*  ?HCT 28.4* 30.0* 29.5*  ?MCV 89.9 89.0 90.2  ?PLT 255 225 218  ? ?Basic Metabolic Panel: ?Recent Labs  ?Lab 04/08/21 ?3419 04/10/21 ?3790 04/12/21 ?0801 04/13/21 ?2409 04/14/21 ?0407  ?NA 140   < > 138 138 139  ?K 3.8   < > 3.9 3.9 3.8  ?CL 100   < > 97* 99 97*  ?CO2 29   < > '30 29 31  '$ ?GLUCOSE 263*   < > 218* 196* 176*  ?BUN 48*   < > 31* 31* 29*  ?CREATININE 1.45*   < > 1.13* 1.25* 0.97  ?CALCIUM 8.8*   < > 9.4 9.3 9.3  ?MG 1.9  --   --   --   --   ? < > = values in this interval not displayed.  ? ?GFR: ?Estimated Creatinine Clearance: 71 mL/min (by C-G formula based on SCr of 0.97 mg/dL). ? ?Liver Function Tests: ?Recent Labs  ?Lab 04/08/21 ?0428  ?AST 27  ?ALT 23  ?ALKPHOS 40  ?BILITOT 0.3  ?PROT 6.8  ?ALBUMIN 2.9*  ? ? ?HbA1C: ?Hgb A1c MFr Bld  ?Date/Time Value Ref Range Status  ?04/07/2021 12:06 AM 6.3 (H) 4.8 - 5.6 % Final  ?  Comment:  ?  (NOTE) ?Pre diabetes:          5.7%-6.4% ? ?Diabetes:              >6.4% ? ?Glycemic control for   <7.0% ?adults with diabetes ?  ?02/01/2021 03:25 AM 7.2 (H) 4.8 - 5.6 % Final  ?  Comment:  ?  (NOTE) ?Pre diabetes:          5.7%-6.4% ? ?Diabetes:              >6.4% ? ?Glycemic control for   <7.0% ?adults with diabetes ?  ? ? ?CBG: ?Recent Labs  ?Lab 04/13/21 ?1119 04/13/21 ?1702 04/13/21 ?2045 04/14/21 ?0723 04/14/21 ?1127  ?GLUCAP 276* 217* 255* 176* 281*  ? ? ?Scheduled Meds: ? apixaban  5 mg Oral BID  ? Chlorhexidine Gluconate Cloth  6 each Topical Q0600  ? [START ON 04/19/2021] ferrous sulfate  325 mg Oral Weekly  ? fluticasone furoate-vilanterol  1 puff Inhalation Daily  ? insulin aspart  0-20 Units Subcutaneous TID WC  ? insulin aspart  0-5 Units Subcutaneous QHS  ?  insulin aspart  5 Units Subcutaneous TID WC  ? lidocaine  1 patch Transdermal Daily  ? rosuvastatin  20 mg Oral QHS  ? senna-docusate  1 tablet Oral BID  ? torsemide  40 mg Oral Daily  ? umeclidinium bromide  1 puff Inhalation Daily  ? ?Continuous Infusions: ? ? ? ? LOS: 7 days  ? ?Eulogio Bear DO ?  [facility] ? ? ?If 7PM-7AM, please contact night-coverage per Amion ?04/14/2021, 1:34 PM ? ? ? ?

## 2021-04-14 NOTE — TOC Progression Note (Signed)
Transition of Care (TOC) - Initial/Assessment Note  ? ? ?Patient Details  ?Name: [name] ?MRN: [id number] ?Date of Birth: 01/17/1946 ? ?Transition of Care (TOC) CM/SW Contact:    ?Paulene Floor Maysa Lynn, LCSWA ?Phone Number: ?04/14/2021, 11:26 AM ? ?Clinical Narrative:                 ?Patient can return to [facility] when medically ready. ? ?TOC will continue to follow.  ? ? ?Patient Goals and CMS Choice ?  ?  ?  ? ?Expected Discharge Plan and Services ?  ?  ?  ?  ?  ?                ?  ?  ?  ?  ?  ?  ?  ?  ?  ?  ? ?Prior Living Arrangements/Services ?  ?  ?  ?       ?  ?  ?  ?  ? ?Activities of Daily Living ?Home Assistive Devices/Equipment: Transport planner, CPAP, Eyeglasses, Hearing aid ?ADL Screening (condition at time of admission) ?Patient's cognitive ability adequate to safely complete daily activities?: Yes ?Is the patient deaf or have difficulty hearing?: Yes ?Does the patient have difficulty seeing, even when wearing glasses/contacts?: Yes ?Does the patient have difficulty concentrating, remembering, or making decisions?: No ?Patient able to express need for assistance with ADLs?: Yes ?Does the patient have difficulty dressing or bathing?: Yes ?Independently performs ADLs?: No ?Communication: Independent ?Dressing (OT): Needs assistance ?Is this a change from baseline?: Pre-admission baseline ?Grooming: Needs assistance ?Is this a change from baseline?: Pre-admission baseline ?Feeding: Independent ?Bathing: Needs assistance ?Is this a change from baseline?: Pre-admission baseline ?Toileting: Needs assistance ?Is this a change from baseline?: Pre-admission baseline ?In/Out Bed: Needs assistance ?Is this a change from baseline?: Pre-admission baseline ?Walks in Home: Needs assistance ?Is this a change from baseline?: Pre-admission baseline ?Does the patient have difficulty walking or climbing stairs?: Yes ?Weakness of Legs: Both ?Weakness of Arms/Hands: Both ? ?Permission Sought/Granted ?  ?  ?   ?   ?   ?    ? ?Emotional Assessment ?  ?  ?  ?  ?  ?  ? ?Admission diagnosis:  AKI (acute kidney injury) ([place]) [N17.9] ?Hypotension, unspecified hypotension type [I95.9] ?Hypotension [I95.9] ?Patient Active Problem List  ? Diagnosis Date Noted  ? Chronic pain syndrome 04/06/2021  ? New onset atrial fibrillation ([place]) 04/06/2021  ? Multiple open wounds of lower leg 04/06/2021  ? Acute encephalopathy 03/31/2021  ? Hypoglycemia 03/31/2021  ? Hypotension 03/31/2021  ? Elevated troponin 03/31/2021  ? Acute on chronic anemia 03/31/2021  ? Chronic diastolic CHF (congestive heart failure) ([place])   ? AKI (acute kidney injury) ([place]) 03/30/2021  ? Leg wound, left 02/01/2021  ? SIRS (systemic inflammatory response syndrome) ([place]) 05/03/2020  ? DM2 (diabetes mellitus, type 2) ([place]) 05/03/2020  ? Chronic respiratory failure with hypoxia and hypercapnia ([place]) 05/03/2020  ? Cervical disc prolapse with radiculopathy 12/07/2019  ? History of pheochromocytoma 12/07/2019  ? 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 OSA on CPAP 12/07/2019  ? 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 Gait abnormality 06/19/2019  ? 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 TIA (transient ischemic attack) 03/22/2017  ? Acute on chronic diastolic CHF (congestive heart failure) ([place]) 03/09/2017  ? Diabetic hypoglycemia ([place]) 03/05/2014  ? 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 Dizzy 12/28/2011  ? CAD (coronary artery disease), native coronary artery 12/05/2010  ? Morbid obesity (  [place]) 12/05/2010  ? Essential hypertension 12/03/2010  ? Hyperlipidemia 12/03/2010  ? UI (urinary incontinence) 12/03/2010  ? Chest pain 05/31/2006  ? ?PCP:  [name], NP ?Pharmacy:  No Pharmacies Listed ? ? ? ?Social Determinants of Health (SDOH) Interventions ?  ? ?Readmission Risk Interventions ?   ? View : No data to display.  ?  ?  ?  ? ? ? ?

## 2021-04-15 DIAGNOSIS — G894 Chronic pain syndrome: Secondary | ICD-10-CM | POA: Diagnosis not present

## 2021-04-15 DIAGNOSIS — N179 Acute kidney failure, unspecified: Secondary | ICD-10-CM | POA: Diagnosis not present

## 2021-04-15 LAB — GLUCOSE, CAPILLARY
Glucose-Capillary: 239 mg/dL — ABNORMAL HIGH (ref 70–99)
Glucose-Capillary: 228 mg/dL — ABNORMAL HIGH (ref 70–99)
Glucose-Capillary: 291 mg/dL — ABNORMAL HIGH (ref 70–99)
Glucose-Capillary: 308 mg/dL — ABNORMAL HIGH (ref 70–99)

## 2021-04-15 LAB — BASIC METABOLIC PANEL
Anion gap: 12 (ref 5–15)
BUN: 35 mg/dL — ABNORMAL HIGH (ref 8–23)
CO2: 31 mmol/L (ref 22–32)
Calcium: 9.5 mg/dL (ref 8.9–10.3)
Chloride: 94 mmol/L — ABNORMAL LOW (ref 98–111)
Creatinine, Ser: 1.34 mg/dL — ABNORMAL HIGH (ref 0.44–1.00)
GFR, Estimated: 42 mL/min — ABNORMAL LOW (ref >60)
Glucose, Bld: 250 mg/dL — ABNORMAL HIGH (ref 70–99)
Potassium: 3.8 mmol/L (ref 3.5–5.1)
Sodium: 137 mmol/L (ref 135–145)

## 2021-04-15 MED ORDER — POLYETHYLENE GLYCOL 3350 17 G PO PACK
17.0000 g | PACK | Freq: Every day | ORAL | Status: DC
  Administered 2021-04-15 – 2021-04-17 (×3): 17 g via ORAL
  Filled 2021-04-15 (×3): qty 1

## 2021-04-15 MED ORDER — SENNOSIDES-DOCUSATE SODIUM 8.6-50 MG PO TABS
2.0000 | ORAL_TABLET | Freq: Two times a day (BID) | ORAL | Status: DC
  Administered 2021-04-15 – 2021-04-17 (×5): 2 via ORAL
  Filled 2021-04-15 (×5): qty 2

## 2021-04-15 MED ORDER — INSULIN GLARGINE-YFGN 100 UNIT/ML ~~LOC~~ SOLN
15.0000 [IU] | Freq: Every day | SUBCUTANEOUS | Status: DC
  Administered 2021-04-16: 15 [IU] via SUBCUTANEOUS
  Filled 2021-04-15: qty 0.15

## 2021-04-15 NOTE — Progress Notes (Signed)
Inpatient Diabetes Program Recommendations ? ?AACE/ADA: New Consensus Statement on Inpatient Glycemic Control (2015) ? ?Target Ranges:  Prepandial:   less than 140 mg/dL ?     Peak postprandial:   less than 180 mg/dL (1-2 hours) ?     Critically ill patients:  140 - 180 mg/dL  ? ?Lab Results  ?Component Value Date  ? GLUCAP 239 (H) 04/15/2021  ? HGBA1C 6.3 (H) 04/07/2021  ? ? ?Review of Glycemic Control ? Latest Reference Range & Units 04/13/21 17:02 04/13/21 20:45 04/14/21 07:23 04/14/21 11:27 04/14/21 15:57 04/14/21 21:37 04/15/21 07:40  ?Glucose-Capillary 70 - 99 mg/dL 217 (H) 255 (H) 176 (H) 281 (H) 300 (H) 273 (H) 239 (H)  ? ?Diabetes history: DM 2 ?Diabetes history: DM2 ?Outpatient Diabetes medications: 70/30 60 units QAM & 73 units QPM ?Current orders for Inpatient glycemic control:  ?Novolog resistant tid with meals and HS ?Novolog 5 units tid with meals ?Semglee 8 units daily ? ?Inpatient Diabetes Program Recommendations:   ? ?Consider increasing Semglee to 15 units daily.  ? ?Thanks,  ?Adah Perl, RN, BC-ADM ?Inpatient Diabetes Coordinator ?Pager [phone]  (8a-5p) ? ? ? ?

## 2021-04-15 NOTE — Plan of Care (Signed)
  Problem: Clinical Measurements: Goal: Respiratory complications will improve Outcome: Adequate for Discharge   

## 2021-04-15 NOTE — Progress Notes (Signed)
?   04/14/21 2138 04/15/21 0144  ?Assess: MEWS Score  ?Temp 98.5 ?F (36.9 ?C) 98.3 ?F (36.8 ?C)  ?BP (!) 146/87 135/80  ?Pulse Rate (!) 131 74  ?Resp 19 18  ?SpO2 99 %  --   ?O2 Device Nasal Cannula  --   ?O2 Flow Rate (L/min) 2 L/min  --   ?Assess: MEWS Score  ?MEWS Temp 0 0  ?MEWS Systolic 0 0  ?MEWS Pulse 3 0  ?MEWS RR 0 0  ?MEWS LOC 0 0  ?MEWS Score 3 0  ?MEWS Score Color Yellow Green  ?Assess: if the MEWS score is Yellow or Red  ?Were vital signs taken at a resting state? Yes  --   ?Focused Assessment Change from prior assessment (see assessment flowsheet)  --   ?Early Detection of Sepsis Score *See Row Information* Low  --   ?MEWS guidelines implemented *See Row Information* Yes  --   ?Treat  ?MEWS Interventions Administered scheduled meds/treatments  --   ?Pain Scale 0-10  --   ?Pain Score 7  --   ?Faces Pain Scale 4  --   ?Pain Type Chronic pain  --   ?Pain Location Leg  --   ?Pain Orientation Right;Left  --   ?Take Vital Signs  ?Increase Vital Sign Frequency  Yellow: Q 2hr X 2 then Q 4hr X 2, if remains yellow, continue Q 4hrs  --   ?Escalate  ?MEWS: Escalate Yellow: discuss with charge nurse/RN and consider discussing with provider and RRT  --   ?Notify: Charge Nurse/RN  ?Name of Charge Nurse/RN Notified Nikki RN  --   ?Date Charge Nurse/RN Notified 04/14/21  --   ?Time Charge Nurse/RN Notified 2140  --   ?Notify: Provider  ?Provider Name/Title N/A  --   ?Notify: Rapid Response  ?Date Rapid Response Notified 04/14/21  --   ?Time Rapid Response Notified 2140  --   ?Document  ?Patient Outcome Stabilized after interventions  --   ?Progress note created (see row info) Yes  --   ? ? ?

## 2021-04-15 NOTE — Progress Notes (Signed)
?PROGRESS NOTE ? ?Lior Cartelli PPJ:[id number] DOB: May 19, 1945 DOA: 04/06/2021 ?PCP: [name], NP ? ? LOS: 8 days  ? ?Brief Narrative / Interim history: ?76 year old with a history of morbid obesity, chronic hypoxic respiratory failure on 6 L nasal cannula at baseline, chronic diastolic CHF, chronic venous stasis, DM2, HTN, HLD, and chronic anemia who was admitted to [facility] 3/13 > 04/02/2021 for AMS and AKI felt to be due to polypharmacy and who was returned to the ED from her SNF due to hypotension (64/42). There was initial concern in the ED she was septic due to chronic B LE wounds, but on careful physical exam there was no evidence of an actual wound infection/cellulitis. While in the ED she was noted to be in Afib w/ RVR. This is a new finding for her. ? ?Subjective / 24h Interval events: ?Denies any chest pain, denies any palpitations.  Complains of chronic leg pain. ? ?Assesement and Plan: ?Principal Problem: ?  AKI (acute kidney injury) ([place]) ?Active Problems: ?  Hypotension ?  New onset atrial fibrillation ([place]) ?  Chronic pain syndrome ?  Multiple open wounds of lower leg ?  Essential hypertension ?  Morbid obesity ([place]) ?  OSA on CPAP ?  DM2 (diabetes mellitus, type 2) ([place]) ? ? ?Assessment and Plan: ?Principal problem ?PAF, new onset -rate presently well controlled. TSH normal. Echo with preserved EF.  Initially she was placed on heparin, but now changed to Eliquis.  Cardiology consulted and followed patient, plan is to continue current medical therapy, and will have follow-up as an outpatient and consider DCCV if she remains in A-fib 3-4 weeks down the road.  She was also diuresed with IV Lasix and has been changed to p.o. on 3/28. ? ?Active problems  ?Hypotension -appears resolved.  Tolerating diuresis ?  ?Possible UTI - Urinalysis at admission significantly abnormal, treated for 3 days ?  ?Acute kidney injury on CKD 3a -quite variable baseline but going back as far as 2022 can range between 0.9 as  well as 1.3.  Increased a little bit today to 1.34, BUN 35.  Suspect due to diuresis ? ?Hx of Pheochromocytoma s/p remote right adrenalectomy -cortisol normal ?  ?Normocytic anemia of chronic disease- Hemoglobin 8-8.5 during recent hospitalization -no evidence of acute blood loss -monitor trend in serial fashion ?  ?Multiple open leg wounds - chronic venous stasis bilateral lower extremities - Was cared for by [facility] during prior admission with no evidence of active infection -still no evidence of active infection this admission -hold any further antibiotic therapy and continue wound care daily and PRN. Unable to elevate extremity or tolerate UNNA boots which limits her healing ?  ?Chronic pain syndrome- Care with narcotics as patient was found to be high risk for medication induced obtundation during last admission -patient did not feel Lyrica was helping her neuropathic pain therefore it has been discontinued. Does not tolerate Neurontin ?  ?Acute on Chronic diastolic CHF - Suspect lower extremity edema is primarily related to venous stasis/obesity/hypoalbuminemia -ability to diuresis greatly limited by hypotension and probable intravascular volume depletion.  Currently on torsemide.  Creatinine slightly up today but would favor to continue torsemide and repeat renal function tomorrow morning.  She was on IV diuresis up until 3/28 ?  ?DM2 -SSI, poorly controlled, with hyperglycemia.  Increase glargine today ? ?CBG (last 3)  ?Recent Labs  ?  04/14/21 ?1557 04/14/21 ?2137 04/15/21 ?0740  ?GLUCAP 300* 273* 239*  ?  ? ?OSA on CPAP -does  not wear ?  ?Morbid obesity - BMI 58, she would benefit from weight loss ? ? ?Scheduled Meds: ? apixaban  5 mg Oral BID  ? [START ON 04/19/2021] ferrous sulfate  325 mg Oral Weekly  ? fluticasone furoate-vilanterol  1 puff Inhalation Daily  ? insulin aspart  0-20 Units Subcutaneous TID WC  ? insulin aspart  0-5 Units Subcutaneous QHS  ? insulin aspart  5 Units Subcutaneous TID WC  ? [START  ON 04/16/2021] insulin glargine-yfgn  15 Units Subcutaneous Daily  ? lidocaine  1 patch Transdermal Daily  ? polyethylene glycol  17 g Oral Daily  ? rosuvastatin  20 mg Oral QHS  ? senna-docusate  2 tablet Oral BID  ? sorbitol, milk of mag, mineral oil, glycerin (SMOG) enema  300 mL Rectal NOW  ? torsemide  40 mg Oral Daily  ? umeclidinium bromide  1 puff Inhalation Daily  ? ?Continuous Infusions: ?PRN Meds:.HYDROcodone-acetaminophen, methocarbamol, ondansetron **OR** ondansetron (ZOFRAN) IV ? ?Diet Orders (From admission, onward)  ? ?  Start     Ordered  ? 04/10/21 1049  Diet Carb Modified Fluid consistency: Thin; Room service appropriate? Yes; Fluid restriction: 1500 mL Fluid  Diet effective now       ?Question Answer Comment  ?Diet-HS Snack? Nothing   ?Calorie Level Medium 1600-2000   ?Fluid consistency: Thin   ?Room service appropriate? Yes   ?Fluid restriction: 1500 mL Fluid   ?  ? 04/10/21 1049  ? ?  ?  ? ?  ? ? ?DVT prophylaxis:  ?apixaban (ELIQUIS) tablet 5 mg  ? ?Lab Results  ?Component Value Date  ? PLT 218 04/13/2021  ? ? ?  Code Status: DNR ? ?Family Communication: no family at bedside ? ?Status is: Inpatient ? ?Remains inpatient appropriate because: Cr elevation ? ? ?Level of care: Telemetry Medical ? ?Consultants:  ?Cardiology  ? ?Procedures:  ?none ? ?Microbiology  ?none ? ?Antimicrobials: ?none  ? ? ?Objective: ?Vitals:  ? 04/15/21 0144 04/15/21 2694 04/15/21 0745 04/15/21 8546  ?BP: 135/80 135/90  130/76  ?Pulse: 74 90  90  ?Resp: '18 20  16  '$ ?Temp: 98.3 ?F (36.8 ?C) 97.6 ?F (36.4 ?C)  98.8 ?F (37.1 ?C)  ?TempSrc:  Oral  Oral  ?SpO2: 100% 98% 98% 100%  ?Weight:      ?Height:      ? ? ?Intake/Output Summary (Last 24 hours) at 04/15/2021 1050 ?Last data filed at 04/15/2021 0900 ?Gross per 24 hour  ?Intake 660 ml  ?Output 450 ml  ?Net 210 ml  ? ?Wt Readings from Last 3 Encounters:  ?04/13/21 (!) 145.8 kg  ?04/02/21 (!) 153.5 kg  ?03/10/21 (!) 152.3 kg  ? ? ?Examination: ? ?Constitutional: NAD ?Eyes: no  scleral icterus ?ENMT: Mucous membranes are moist.  ?Neck: normal, supple ?Respiratory: clear to auscultation bilaterally, no wheezing, no crackles. Normal respiratory effort.  ?Cardiovascular: Regular rate and rhythm, no murmurs / rubs / gallops.  ?Abdomen: non distended, no tenderness. Bowel sounds positive.  ?Musculoskeletal: no clubbing / cyanosis.  ?Skin: no rashes ?Neurologic: non focal  ? ?Data Reviewed: I have independently reviewed following labs and imaging studies ? ?CBC ?Recent Labs  ?Lab 04/09/21 ?0410 04/10/21 ?0332 04/11/21 ?0251 04/12/21 ?0505 04/13/21 ?2703  ?WBC 9.0 7.8 8.5 9.1 10.3  ?HGB 8.2* 8.4* 8.3* 9.0* 8.8*  ?HCT 27.4* 28.5* 28.4* 30.0* 29.5*  ?PLT 250 240 255 225 218  ?MCV 91.0 90.8 89.9 89.0 90.2  ?MCH 27.2 26.8 26.3 26.7 26.9  ?MCHC  29.9* 29.5* 29.2* 30.0 29.8*  ?RDW 17.4* 17.2* 16.9* 16.9* 16.9*  ? ? ?Recent Labs  ?Lab 04/11/21 ?0251 04/12/21 ?0801 04/13/21 ?6433 04/14/21 ?0407 04/15/21 ?2951  ?NA 139 138 138 139 137  ?K 3.8 3.9 3.9 3.8 3.8  ?CL 100 97* 99 97* 94*  ?CO2 '31 30 29 31 31  '$ ?GLUCOSE 179* 218* 196* 176* 250*  ?BUN 34* 31* 31* 29* 35*  ?CREATININE 1.20* 1.13* 1.25* 0.97 1.34*  ?CALCIUM 9.6 9.4 9.3 9.3 9.5  ? ? ?------------------------------------------------------------------------------------------------------------------ ?No results for input(s): CHOL, HDL, LDLCALC, TRIG, CHOLHDL, LDLDIRECT in the last 72 hours. ? ?Lab Results  ?Component Value Date  ? HGBA1C 6.3 (H) 04/07/2021  ? ?------------------------------------------------------------------------------------------------------------------ ?No results for input(s): TSH, T4TOTAL, T3FREE, THYROIDAB in the last 72 hours. ? ?Invalid input(s): FREET3 ? ?Cardiac Enzymes ?No results for input(s): CKMB, TROPONINI, MYOGLOBIN in the last 168 hours. ? ?Invalid input(s): CK ?------------------------------------------------------------------------------------------------------------------ ?   ?Component Value Date/Time  ? BNP 180.2  (H) 04/06/2021 2027  ? ? ?CBG: ?Recent Labs  ?Lab 04/14/21 ?0723 04/14/21 ?1127 04/14/21 ?1557 04/14/21 ?2137 04/15/21 ?0740  ?GLUCAP 176* 281* 300* 273* 239*  ? ? ?Recent Results (from the past 240 hour(

## 2021-04-15 NOTE — Progress Notes (Signed)
Nutrition Brief Note ? ?RD consulted to discuss weight loss with patient. ? ?Obesity is a complex, chronic medical condition that is optimally managed by a multidisciplinary care team. Weight loss is not an ideal goal for an acute inpatient hospitalization. However, if further work-up for obesity is warranted, consider outpatient referral to outpatient bariatric service and/or [facility].  ? ?Admitting Dx: AKI (acute kidney injury) ([place]) [N17.9] ?Hypotension, unspecified hypotension type [I95.9] ?Hypotension [I95.9] ?PMH:  ?Past Medical History:  ?Diagnosis Date  ? Coronary artery disease   ? Diabetes mellitus without complication ([place])   ? Hyperlipidemia   ? Hypertension   ? OSA (obstructive sleep apnea)   ? ?Medications:  ? apixaban  5 mg Oral BID  ? [START ON 04/19/2021] ferrous sulfate  325 mg Oral Weekly  ? fluticasone furoate-vilanterol  1 puff Inhalation Daily  ? insulin aspart  0-20 Units Subcutaneous TID WC  ? insulin aspart  0-5 Units Subcutaneous QHS  ? insulin aspart  5 Units Subcutaneous TID WC  ? [START ON 04/16/2021] insulin glargine-yfgn  15 Units Subcutaneous Daily  ? lidocaine  1 patch Transdermal Daily  ? polyethylene glycol  17 g Oral Daily  ? rosuvastatin  20 mg Oral QHS  ? senna-docusate  2 tablet Oral BID  ? sorbitol, milk of mag, mineral oil, glycerin (SMOG) enema  300 mL Rectal NOW  ? torsemide  40 mg Oral Daily  ? umeclidinium bromide  1 puff Inhalation Daily  ? ?Labs: ?Recent Labs  ?Lab 04/13/21 ?0721 04/14/21 ?0407 04/15/21 ?8889  ?NA 138 139 137  ?K 3.9 3.8 3.8  ?CL 99 97* 94*  ?CO2 '29 31 31  '$ ?BUN 31* 29* 35*  ?CREATININE 1.25* 0.97 1.34*  ?CALCIUM 9.3 9.3 9.5  ?GLUCOSE 196* 176* 250*  ? ?Wt Readings from Last 15 Encounters:  ?04/13/21 (!) 145.8 kg  ?04/02/21 (!) 153.5 kg  ?03/10/21 (!) 152.3 kg  ?03/04/21 (!) 152.2 kg  ?02/05/21 (!) 150.5 kg  ?05/03/20 (!) 151.2 kg  ?02/22/20 (!) 145.2 kg  ?10/03/19 (!) 143.8 kg  ?08/16/19 (!) 146 kg  ?Body mass  index is 58.78 kg/m?Marland Kitchen Patient meets criteria for morbid obesity based on current BMI.  ? ?Current diet order is carb modified, patient is consuming approximately 75-100% of meals at this time.  ? ?No nutrition interventions warranted at this time. If nutrition issues arise, please consult RD.  ? ? ?Theone Stanley., MS, RD, LDN (she/her/hers) ?RD pager number and weekend/on-call pager number located in [place]. ? ? ?

## 2021-04-16 DIAGNOSIS — G894 Chronic pain syndrome: Secondary | ICD-10-CM | POA: Diagnosis not present

## 2021-04-16 DIAGNOSIS — N179 Acute kidney failure, unspecified: Secondary | ICD-10-CM | POA: Diagnosis not present

## 2021-04-16 LAB — BASIC METABOLIC PANEL
Anion gap: 12 (ref 5–15)
BUN: 34 mg/dL — ABNORMAL HIGH (ref 8–23)
CO2: 31 mmol/L (ref 22–32)
Calcium: 9.3 mg/dL (ref 8.9–10.3)
Chloride: 96 mmol/L — ABNORMAL LOW (ref 98–111)
Creatinine, Ser: 1.24 mg/dL — ABNORMAL HIGH (ref 0.44–1.00)
GFR, Estimated: 46 mL/min — ABNORMAL LOW (ref >60)
Glucose, Bld: 233 mg/dL — ABNORMAL HIGH (ref 70–99)
Potassium: 3.8 mmol/L (ref 3.5–5.1)
Sodium: 139 mmol/L (ref 135–145)

## 2021-04-16 LAB — CBC
HCT: 30 % — ABNORMAL LOW (ref 36.0–46.0)
Hemoglobin: 9 g/dL — ABNORMAL LOW (ref 12.0–15.0)
MCH: 26.2 pg (ref 26.0–34.0)
MCHC: 30 g/dL (ref 30.0–36.0)
MCV: 87.2 fL (ref 80.0–100.0)
Platelets: 224 10*3/uL (ref 150–400)
RBC: 3.44 MIL/uL — ABNORMAL LOW (ref 3.87–5.11)
RDW: 17.1 % — ABNORMAL HIGH (ref 11.5–15.5)
WBC: 7.4 10*3/uL (ref 4.0–10.5)
nRBC: 0 % (ref 0.0–0.2)

## 2021-04-16 LAB — GLUCOSE, CAPILLARY
Glucose-Capillary: 279 mg/dL — ABNORMAL HIGH (ref 70–99)
Glucose-Capillary: 260 mg/dL — ABNORMAL HIGH (ref 70–99)
Glucose-Capillary: 275 mg/dL — ABNORMAL HIGH (ref 70–99)
Glucose-Capillary: 208 mg/dL — ABNORMAL HIGH (ref 70–99)

## 2021-04-16 MED ORDER — INSULIN GLARGINE-YFGN 100 UNIT/ML ~~LOC~~ SOLN
18.0000 [IU] | Freq: Every day | SUBCUTANEOUS | Status: DC
  Administered 2021-04-17: 18 [IU] via SUBCUTANEOUS
  Filled 2021-04-16: qty 0.18

## 2021-04-16 MED ORDER — INSULIN ASPART 100 UNIT/ML IJ SOLN
7.0000 [IU] | Freq: Three times a day (TID) | INTRAMUSCULAR | Status: DC
  Administered 2021-04-16 – 2021-04-17 (×3): 7 [IU] via SUBCUTANEOUS

## 2021-04-16 MED ORDER — DILTIAZEM HCL ER 90 MG PO CP12: 90.0000 mg | ORAL_CAPSULE | Freq: Two times a day (BID) | ORAL | Status: DC

## 2021-04-16 MED ORDER — DILTIAZEM HCL ER 60 MG PO CP12
60.0000 mg | ORAL_CAPSULE | Freq: Two times a day (BID) | ORAL | Status: DC
  Administered 2021-04-16 – 2021-04-17 (×3): 60 mg via ORAL
  Filled 2021-04-16 (×4): qty 1

## 2021-04-16 NOTE — Progress Notes (Signed)
Physical Therapy Treatment ?Patient Details ?Name: [name] ?MRN: [id number] ?DOB: 09-29-1945 ?Today's Date: 04/16/2021 ? ? ?History of Present Illness 76 year old admitted 3/20 to the ED from her SNF due to hypotension (64/42).  Recently admitted to [facility] 3/13 > 04/02/2021 for AMS and AKI felt to be due to polypharmacy. PMH:  morbid obesity, chronic hypoxic respiratory failure on 6 L nasal cannula at baseline, chronic diastolic CHF, chronic venous stasis, DM2, HTN, HLD, and chronic anemia ? ?  ?PT Comments  ? ? Pt is demonstrating ability to stand and transfer, but with pain in LLE she is limited even with pain meds having been given to her.  Pt is tolerating exercises in very limited quantity  esp as knee and ankle ROM is performed.  Pt is appropriate for SNF care since she is hoping to recover her independence with gait and transfers, and will need help with her ongoing cellulitis care to complete healing process.  Follow along as goals of acute PT outline.   ?Recommendations for follow up therapy are one component of a multi-disciplinary discharge planning process, led by the attending physician.  Recommendations may be updated based on patient status, additional functional criteria and insurance authorization. ? ?Follow Up Recommendations ? Skilled nursing-short term rehab (<3 hours/day) ?  ?  ?Assistance Recommended at Discharge Frequent or constant Supervision/Assistance  ?Patient can return home with the following A little help with walking and/or transfers;A little help with bathing/dressing/bathroom ?  ?Equipment Recommendations ? None recommended by PT  ?  ?Recommendations for Other Services   ? ? ?  ?Precautions / Restrictions Precautions ?Precautions: Fall;Other (comment) ?Precaution Comments: LLE wound, 02, watch BP ?Restrictions ?Weight Bearing Restrictions: No  ?  ? ?Mobility ? Bed Mobility ?Overal bed mobility: Needs Assistance ?  ?  ?  ?  ?  ?  ?General bed mobility comments: in chair ?   ? ?Transfers ?Overall transfer level: Needs assistance ?Equipment used: None ?Transfers: Sit to/from Stand, Bed to chair/wheelchair/BSC ?Sit to Stand: Min guard ?  ?Step pivot transfers: Min guard ?  ?  ?  ?  ?  ? ?Ambulation/Gait ?  ?  ?  ?  ?  ?  ?  ?General Gait Details: pain in LLE prevents much standing time ? ? ?Stairs ?  ?  ?  ?  ?  ? ? ?Wheelchair Mobility ?  ? ?Modified Rankin (Stroke Patients Only) ?  ? ? ?  ?Balance Overall balance assessment: Needs assistance ?Sitting-balance support: Feet supported ?Sitting balance-Leahy Scale: Fair ?  ?  ?Standing balance support: Single extremity supported, During functional activity, Bilateral upper extremity supported ?Standing balance-Leahy Scale: Poor ?  ?  ?  ?  ?  ?  ?  ?  ?  ?  ?  ?  ?  ? ?  ?Cognition Arousal/Alertness: Awake/alert ?Behavior During Therapy: [facility] for tasks assessed/performed ?Overall Cognitive Status: Within Functional Limits for tasks assessed ?  ?  ?  ?  ?  ?  ?  ?  ?  ?  ?  ?  ?  ?  ?  ?  ?  ?  ?  ? ?  ?Exercises General Exercises - Lower Extremity ?Ankle Circles/Pumps: AROM, 5 reps ?Long Arc Quad: AAROM, 10 reps ?Heel Slides: AROM, 10 reps ?Hip ABduction/ADduction: 20 reps, AROM ?Hip Flexion/Marching: AROM, 10 reps ? ?  ?General Comments General comments (skin integrity, edema, etc.): Pt is in pain and requires help to move to chair from [facility],  but is able to stand well despite her pain.  Declining to stand longer due to her discomfort ?  ?  ? ?Pertinent Vitals/Pain Pain Assessment ?Pain Assessment: Faces ?Faces Pain Scale: Hurts even more ?Pain Location: LLE more than RLE ?Pain Descriptors / Indicators: Guarding, Grimacing, Sharp ?Pain Intervention(s): Limited activity within patient's tolerance, Monitored during session, Premedicated before session, Repositioned  ? ? ?Home Living   ?  ?  ?  ?  ?  ?  ?  ?  ?  ?   ?  ?Prior Function    ?  ?  ?   ? ?PT Goals (current goals can now be found in the care plan section) Acute Rehab PT  Goals ?Patient Stated Goal: [place] SNF then ALF ? ?  ?Frequency ? ? ? Min 2X/week ? ? ? ?  ?PT Plan Current plan remains appropriate  ? ? ?Co-evaluation   ?  ?  ?  ?  ? ?  ?AM-PAC PT "6 Clicks" Mobility   ?Outcome Measure ? Help needed turning from your back to your side while in a flat bed without using bedrails?: A Little ?Help needed moving from lying on your back to sitting on the side of a flat bed without using bedrails?: A Little ?Help needed moving to and from a bed to a chair (including a wheelchair)?: A Little ?Help needed standing up from a chair using your arms (e.g., wheelchair or bedside chair)?: A Little ?Help needed to walk in hospital room?: A Lot ?Help needed climbing 3-5 steps with a railing? : Total ?6 Click Score: 15 ? ?  ?End of Session Equipment Utilized During Treatment: Oxygen ?Activity Tolerance: Patient limited by fatigue;Patient limited by pain ?Patient left: in chair;with call bell/phone within reach ?Nurse Communication: Mobility status ?PT Visit Diagnosis: Pain;Muscle weakness (generalized) (M62.81);Difficulty in walking, not elsewhere classified (R26.2) ?Pain - Right/Left: Left ?Pain - part of body: Leg ?  ? ? ?Time: 0177-9390 ?PT Time Calculation (min) (ACUTE ONLY): 39 min ? ?Charges:  $Therapeutic Exercise: 8-22 mins ?$Therapeutic Activity: 23-37 mins    ?Ramond Dial ?04/16/2021, 7:06 PM ? ?Mee Hives, PT PhD ?Acute Rehab Dept. Number: [facility] [phone] and [place] [phone] ? ? ?

## 2021-04-16 NOTE — Progress Notes (Addendum)
?PROGRESS NOTE ? ?[name] RCV:[id number] DOB: August 21, 1945 DOA: 04/06/2021 ?PCP: [name], NP ? ? LOS: 9 days  ? ?Brief Narrative / Interim history: ?76 year old with a history of morbid obesity, chronic hypoxic respiratory failure on 6 L nasal cannula at baseline, chronic diastolic CHF, chronic venous stasis, DM2, HTN, HLD, and chronic anemia who was admitted to [facility] 3/13 > 04/02/2021 for AMS and AKI felt to be due to polypharmacy and who was returned to the ED from her SNF due to hypotension (64/42). There was initial concern in the ED she was septic due to chronic B LE wounds, but on careful physical exam there was no evidence of an actual wound infection/cellulitis. While in the ED she was noted to be in Afib w/ RVR. This is a new finding for her. ? ?Subjective / 24h Interval events: ?She is concerned about her heart rate this morning, someone walked in this morning and checked on her and she was told that her heart rate was very high ? ?Assesement and Plan: ?Principal Problem: ?  AKI (acute kidney injury) ([place]) ?Active Problems: ?  Hypotension ?  New onset atrial fibrillation ([place]) ?  Chronic pain syndrome ?  Multiple open wounds of lower leg ?  Essential hypertension ?  Morbid obesity ([place]) ?  OSA on CPAP ?  DM2 (diabetes mellitus, type 2) ([place]) ? ? ?Assessment and Plan: ?Principal problem ?PAF, new onset, A-fib with RVR-telemetry reviewed, she has been having increased rates up to the 150s.  She does not seem to have significant symptoms with, but seems to be quite frightened when he has been told that the heart rate is elevated. TSH normal. Echo with preserved EF.  Initially she was placed on heparin, but now changed to Eliquis.  Cardiology consulted and followed patient, plan is to continue current medical therapy, and will have follow-up as an outpatient and consider DCCV if she remains in A-fib 3-4 weeks down the road.  She was also diuresed with IV Lasix and has been changed to p.o. on 3/28.   Given elevated rates, add Cardizem.  She is allergic to metoprolol ? ?Active problems  ?Hypotension -appears resolved.  Monitor while on Cardizem ?  ?Possible UTI - Urinalysis at admission significantly abnormal, treated for 3 days ?  ?Acute kidney injury on CKD 3a -quite variable baseline but going back as far as 2022 can range between 0.9 as well as 1.3.  Creatinine overall stable, she is tolerating torsemide ? ?Hx of Pheochromocytoma s/p remote right adrenalectomy -cortisol normal ?  ?Normocytic anemia of chronic disease- Hemoglobin 8-8.5 during recent hospitalization -no evidence of acute blood loss -monitor trend in serial fashion ?  ?Multiple open leg wounds - chronic venous stasis bilateral lower extremities - Was cared for by [facility] during prior admission with no evidence of active infection -still no evidence of active infection this admission -hold any further antibiotic therapy and continue wound care daily and PRN. Unable to elevate extremity or tolerate UNNA boots which limits her healing ?  ?Chronic pain syndrome- Care with narcotics as patient was found to be high risk for medication induced obtundation during last admission -patient did not feel Lyrica was helping her neuropathic pain therefore it has been discontinued. Does not tolerate Neurontin ?  ?Acute on Chronic diastolic CHF - Suspect lower extremity edema is primarily related to venous stasis/obesity/hypoalbuminemia -ability to diuresis greatly limited by hypotension and probable intravascular volume depletion.  Continue torsemide, renal function remained stable ?  ?DM2 -SSI,  poorly controlled, with hyperglycemia.  Continue to increase glargine as well as mealtime insulin ? ?CBG (last 3)  ?Recent Labs  ?  04/15/21 ?1630 04/15/21 ?2111 04/16/21 ?0721  ?GLUCAP 291* 308* 279*  ? ?  ? ?OSA on CPAP -does not wear ?  ?Morbid obesity - BMI 58, she would benefit from weight loss ? ? ?Scheduled Meds: ? apixaban  5 mg Oral BID  ? diltiazem  60 mg Oral  Q12H  ? [START ON 04/19/2021] ferrous sulfate  325 mg Oral Weekly  ? fluticasone furoate-vilanterol  1 puff Inhalation Daily  ? insulin aspart  0-20 Units Subcutaneous TID WC  ? insulin aspart  0-5 Units Subcutaneous QHS  ? insulin aspart  5 Units Subcutaneous TID WC  ? insulin glargine-yfgn  15 Units Subcutaneous Daily  ? lidocaine  1 patch Transdermal Daily  ? polyethylene glycol  17 g Oral Daily  ? rosuvastatin  20 mg Oral QHS  ? senna-docusate  2 tablet Oral BID  ? torsemide  40 mg Oral Daily  ? umeclidinium bromide  1 puff Inhalation Daily  ? ?Continuous Infusions: ?PRN Meds:.HYDROcodone-acetaminophen, methocarbamol, ondansetron **OR** ondansetron (ZOFRAN) IV ? ?Diet Orders (From admission, onward)  ? ?  Start     Ordered  ? 04/10/21 1049  Diet Carb Modified Fluid consistency: Thin; Room service appropriate? Yes; Fluid restriction: 1500 mL Fluid  Diet effective now       ?Question Answer Comment  ?Diet-HS Snack? Nothing   ?Calorie Level Medium 1600-2000   ?Fluid consistency: Thin   ?Room service appropriate? Yes   ?Fluid restriction: 1500 mL Fluid   ?  ? 04/10/21 1049  ? ?  ?  ? ?  ? ? ?DVT prophylaxis:  ?apixaban (ELIQUIS) tablet 5 mg  ? ?Lab Results  ?Component Value Date  ? PLT 224 04/16/2021  ? ? ?  Code Status: Full Code ? ?Family Communication: no family at bedside ? ?Status is: Inpatient ? ?Remains inpatient appropriate because: Cr elevation ? ? ?Level of care: Telemetry Medical ? ?Consultants:  ?Cardiology  ? ?Procedures:  ?none ? ?Microbiology  ?none ? ?Antimicrobials: ?none  ? ? ?Objective: ?Vitals:  ? 04/15/21 2111 04/16/21 0507 04/16/21 0510 04/16/21 0836  ?BP: (!) 131/52 (!) 178/94    ?Pulse: (!) 101 (!) 54    ?Resp: 19 17    ?Temp: 98.7 ?F (37.1 ?C) 98.4 ?F (36.9 ?C)    ?TempSrc: Oral Oral    ?SpO2: 99% 99%  98%  ?Weight:   (!) 141.9 kg   ?Height:      ? ? ?Intake/Output Summary (Last 24 hours) at 04/16/2021 1059 ?Last data filed at 04/16/2021 6144 ?Gross per 24 hour  ?Intake 880 ml  ?Output 500 ml   ?Net 380 ml  ? ? ?Wt Readings from Last 3 Encounters:  ?04/16/21 (!) 141.9 kg  ?04/02/21 (!) 153.5 kg  ?03/10/21 (!) 152.3 kg  ? ? ?Examination: ? ?Constitutional: NAD ?Eyes: lids and conjunctivae normal, no scleral icterus ?ENMT: mmm ?Neck: normal, supple ?Respiratory: clear to auscultation bilaterally, no wheezing, no crackles. Normal respiratory effort.  ?Cardiovascular: Irregularly irregular, no murmurs / rubs / gallops.  Wrapped lower extremities ?Abdomen: soft, no distention, no tenderness. Bowel sounds positive.  ?Skin: no rashes ?Neurologic: no focal deficits, equal strength ? ?Data Reviewed: I have independently reviewed following labs and imaging studies ? ?CBC ?Recent Labs  ?Lab 04/10/21 ?0332 04/11/21 ?0251 04/12/21 ?0505 04/13/21 ?3154 04/16/21 ?0086  ?WBC 7.8 8.5 9.1 10.3 7.4  ?  HGB 8.4* 8.3* 9.0* 8.8* 9.0*  ?HCT 28.5* 28.4* 30.0* 29.5* 30.0*  ?PLT 240 255 225 218 224  ?MCV 90.8 89.9 89.0 90.2 87.2  ?MCH 26.8 26.3 26.7 26.9 26.2  ?MCHC 29.5* 29.2* 30.0 29.8* 30.0  ?RDW 17.2* 16.9* 16.9* 16.9* 17.1*  ? ? ? ?Recent Labs  ?Lab 04/12/21 ?0801 04/13/21 ?0721 04/14/21 ?0407 04/15/21 ?6812 04/16/21 ?7517  ?NA 138 138 139 137 139  ?K 3.9 3.9 3.8 3.8 3.8  ?CL 97* 99 97* 94* 96*  ?CO2 '30 29 31 31 31  '$ ?GLUCOSE 218* 196* 176* 250* 233*  ?BUN 31* 31* 29* 35* 34*  ?CREATININE 1.13* 1.25* 0.97 1.34* 1.24*  ?CALCIUM 9.4 9.3 9.3 9.5 9.3  ? ? ? ?------------------------------------------------------------------------------------------------------------------ ?No results for input(s): CHOL, HDL, LDLCALC, TRIG, CHOLHDL, LDLDIRECT in the last 72 hours. ? ?Lab Results  ?Component Value Date  ? HGBA1C 6.3 (H) 04/07/2021  ? ?------------------------------------------------------------------------------------------------------------------ ?No results for input(s): TSH, T4TOTAL, T3FREE, THYROIDAB in the last 72 hours. ? ?Invalid input(s): FREET3 ? ?Cardiac Enzymes ?No results for input(s): CKMB, TROPONINI, MYOGLOBIN in the last  168 hours. ? ?Invalid input(s): CK ?------------------------------------------------------------------------------------------------------------------ ?   ?Component Value Date/Time  ? BNP 180.2 (H) 03/20/2

## 2021-04-16 NOTE — Plan of Care (Signed)
  Problem: Health Behavior/Discharge Planning: Goal: Ability to manage health-related needs will improve Outcome: Progressing   Problem: Elimination: Goal: Will not experience complications related to bowel motility Outcome: Progressing Goal: Will not experience complications related to urinary retention Outcome: Progressing   Problem: Pain Managment: Goal: General experience of comfort will improve Outcome: Progressing   Problem: Safety: Goal: Ability to remain free from injury will improve Outcome: Progressing   

## 2021-04-17 DIAGNOSIS — N179 Acute kidney failure, unspecified: Secondary | ICD-10-CM | POA: Diagnosis not present

## 2021-04-17 LAB — GLUCOSE, CAPILLARY: Glucose-Capillary: 262 mg/dL — ABNORMAL HIGH (ref 70–99)

## 2021-04-17 MED ORDER — DILTIAZEM HCL ER 60 MG PO CP12: 60.0000 mg | ORAL_CAPSULE | Freq: Two times a day (BID) | ORAL | Status: AC

## 2021-04-17 MED ORDER — APIXABAN 5 MG PO TABS: 5.0000 mg | ORAL_TABLET | Freq: Two times a day (BID) | ORAL | Status: AC

## 2021-04-17 MED ORDER — TORSEMIDE 40 MG PO TABS
40.0000 mg | ORAL_TABLET | Freq: Every day | ORAL | Status: AC
Start: 2021-04-17 — End: ?

## 2021-04-17 MED ORDER — HYDROCODONE-ACETAMINOPHEN 5-325 MG PO TABS: 1.0000 | ORAL_TABLET | Freq: Four times a day (QID) | ORAL | 0 refills | Status: AC | PRN

## 2021-04-17 NOTE — Discharge Summary (Signed)
? ?Physician Discharge Summary  ?[name] DHR:[id number] DOB: 1945/12/21 DOA: 04/06/2021 ? ?PCP: [name], NP ? ?Admit date: 04/06/2021 ?Discharge date: 04/17/2021 ? ?Admitted From: SNF ?Disposition:  SNF ? ?Recommendations for Outpatient Follow-up:  ?Follow up with PCP in 1-2 weeks ?Recommend sleep study set up as an outpatient ?Check BMP / CBC in 3-4 days  ?Cardiology follow up in 3 weeks ? ?Home Health: none ?Equipment/Devices: none ? ?Discharge Condition: stable ?CODE STATUS: Full code ?Diet recommendation: low sodium, heart healthy ? ?HPI: Per admitting MD, ?[name] is a 76 y.o. female with medical history significant of morbid obesity, CPS, OSA, DM2, HTN. Pt with recent admit from 3/14-3/16 with hypotension, AKI, secondary to polypharmacy.  Pt improved with reduction in lyrica and opiates.  Pt discharged to SNF. Pt in to ED today from SNF:  Staff called EMS for blood pressure reading of 62/42, last EMS blood pressure 96/48.  ?Pt with weeping wounds on BLE that were recently dressed by wound care. Pt given empiric ABx and 1.5L IVF for possible sepsis in ED. ? ?Hospital Course / Discharge diagnoses: ?Principal Problem: ?  AKI (acute kidney injury) ([place]) ?Active Problems: ?  Hypotension ?  New onset atrial fibrillation ([place]) ?  Chronic pain syndrome ?  Multiple open wounds of lower leg ?  Essential hypertension ?  Morbid obesity ([place]) ?  OSA on CPAP ?  DM2 (diabetes mellitus, type 2) ([place]) ? ? ?Assessment and Plan: ?Principal problem ?PAF, new onset, A-fib with RVR-cardiology consulted and followed patient while hospitalized.  TSH was normal.  Echo showed preserved EF.  She was initially anticoagulated with heparin then changed to Eliquis.  She was initially hypertensive on admission but blood pressure has normalized.  She has been having intermittent RVR then eventually was able to be started on diltiazem with improvement in her rates.  She was also diuresed with IV Lasix but eventually transition  to torsemide which she seems to be tolerating well.  Plan is to continue current medical therapy, follow-up with cardiology as an outpatient and consider DCCV if he remains in A-fib 3 weeks from now ? ?Active problems  ?Hypotension -appears resolved.  Monitor while on Cardizem ?Possible UTI - Urinalysis at admission significantly abnormal, treated for 3 days ?Acute kidney injury on CKD 3a -quite variable baseline but going back as far as 2022 can range between 0.9 as well as 1.3.  Creatinine overall stable, she is tolerating torsemide, at baseline at the time of discharge  ?Hx of Pheochromocytoma s/p remote right adrenalectomy -cortisol normal ?Normocytic anemia of chronic disease- Hemoglobin 8-8.5 during recent hospitalization -no evidence of acute blood loss -monitor trend in serial fashion ?Multiple open leg wounds - chronic venous stasis bilateral lower extremities - Was cared for by [facility] during prior admission with no evidence of active infection -still no evidence of active infection this admission -hold any further antibiotic therapy and continue wound care daily and PRN. Unable to elevate extremity or tolerate UNNA boots which limits her healing ?Chronic pain syndrome- Care with narcotics as patient was found to be high risk for medication induced obtundation during last admission -patient did not feel Lyrica was helping her neuropathic pain therefore it has been discontinued. Does not tolerate Neurontin ?Acute on Chronic diastolic CHF - Suspect lower extremity edema is primarily related to venous stasis/obesity/hypoalbuminemia -ability to diuresis greatly limited by hypotension and probable intravascular volume depletion.  Continue torsemide, renal function remained stable ?DM2 -SSI, poorly controlled, with hyperglycemia.  Continue to  increase glargine as well as mealtime insulin ?OSA on CPAP -does not wear because she needs a repeat sleep study.  Please address this ASAP as an outpatient as having untreated  sleep apnea for prolonged period of time is detrimental  ?Morbid obesity - BMI 58, she would benefit from weight loss ? ?Sepsis ruled out ? ? ?Discharge Instructions ? ? ?Allergies as of 04/17/2021   ? ?   Reactions  ? Metaxalone Anaphylaxis  ? Nitroglycerin   ? BP bottoms out  ? Tramadol-acetaminophen Anaphylaxis  ? Icosapent Ethyl Other (See Comments)  ? Stomach pain, gas, diarrhea, headache from Vascepa  ? Brompheniramine-pseudoeph   ? Feels drunk  ? Meprobamate Other (See Comments)  ? unknown  ? Metformin Nausea Only  ? "flushes"  ? Methyldopa   ? Other reaction(s): Unknown  ? Metoprolol   ? Other reaction(s): Unknown  ? Procaine   ? Passes out  ? Pseudoephedrine Hcl   ? "feel drunk"  ? Tramadol   ? "Made me go wonky, got fluid in my lungs"  ? ?  ? ?  ?Medication List  ?  ? ?STOP taking these medications   ? ?carvedilol 25 MG tablet ?Commonly known as: COREG ?  ?lisinopril 10 MG tablet ?Commonly known as: ZESTRIL ?  ?metolazone 2.5 MG tablet ?Commonly known as: ZAROXOLYN ?  ? ?  ? ?TAKE these medications   ? ?acetaminophen 500 MG tablet ?Commonly known as: TYLENOL ?Take 500 mg by mouth every 8 (eight) hours as needed for moderate pain or headache. ?  ?albuterol 108 (90 Base) MCG/ACT inhaler ?Commonly known as: VENTOLIN HFA ?Inhale 2 puffs into the lungs every 6 (six) hours as needed for wheezing or shortness of breath. ?  ?apixaban 5 MG Tabs tablet ?Commonly known as: ELIQUIS ?Take 1 tablet (5 mg total) by mouth 2 (two) times daily. ?  ?aspirin EC 81 MG tablet ?Take 81 mg by mouth daily. Swallow whole. ?  ?Breo Ellipta 200-25 MCG/ACT Aepb ?Generic drug: fluticasone furoate-vilanterol ?Inhale 1 puff into the lungs daily. ?  ?cetirizine 10 MG tablet ?Commonly known as: ZYRTEC ?Take 10 mg by mouth daily. ?  ?diclofenac Sodium 1 % Gel ?Commonly known as: VOLTAREN ?Apply 2-4 g topically in the morning, at noon, and at bedtime. ?  ?diltiazem 60 MG 12 hr capsule ?Commonly known as: CARDIZEM SR ?Take 1 capsule (60 mg  total) by mouth every 12 (twelve) hours. ?  ?ferrous sulfate 325 (65 FE) MG tablet ?Take 1 tablet (325 mg total) by mouth daily with breakfast. ?  ?fluticasone 50 MCG/ACT nasal spray ?Commonly known as: FLONASE ?Place 1 spray into both nostrils daily. ?  ?glucose 4 GM chewable tablet ?Chew 1 tablet by mouth daily as needed for low blood sugar. ?  ?HYDROcodone-acetaminophen 5-325 MG tablet ?Commonly known as: NORCO/VICODIN ?Take 1 tablet by mouth every 6 (six) hours as needed for moderate pain. ?  ?insulin NPH-regular Human (70-30) 100 UNIT/ML injection ?Inject 73 Units into the skin with breakfast, with lunch, and with evening meal. Hold if CBG less than 120 or if resident is going to eat less than 50 % of meal. ?  ?ipratropium-albuterol 0.5-2.5 (3) MG/3ML Soln ?Commonly known as: DUONEB ?Take 3 mLs by nebulization every 6 (six) hours as needed (wheezing). ?  ?Ixekizumab 80 MG/ML Soaj ?Inject 1 Dose into the skin every 30 (thirty) days. ?  ?Lidocaine 4 % Ptch ?Apply 1 application. topically daily. Left shoulder ?  ?liver oil-zinc oxide 40 %  ointment ?Commonly known as: DESITIN ?Apply 1 application. topically daily. ?  ?melatonin 5 MG Tabs ?Take 1 tablet (5 mg total) by mouth at bedtime. ?  ?methocarbamol 500 MG tablet ?Commonly known as: ROBAXIN ?Take 500 mg by mouth every 6 (six) hours as needed for muscle spasms. ?  ?MULTIVITAMIN ADULT PO ?Take 1 tablet by mouth daily. ?  ?ondansetron 4 MG tablet ?Commonly known as: ZOFRAN ?Take 4 mg by mouth every 8 (eight) hours as needed for nausea or vomiting. ?  ?polyethylene glycol 17 g packet ?Commonly known as: MIRALAX / GLYCOLAX ?Take 17 g by mouth daily as needed for mild constipation. ?  ?pregabalin 25 MG capsule ?Commonly known as: LYRICA ?Take 1 capsule (25 mg total) by mouth 2 (two) times daily. ?  ?rOPINIRole 0.25 MG tablet ?Commonly known as: REQUIP ?Take 0.25 mg by mouth daily as needed (RLS). ?  ?rosuvastatin 20 MG tablet ?Commonly known as: CRESTOR ?Take 20 mg  by mouth at bedtime. ?  ?senna-docusate 8.6-50 MG tablet ?Commonly known as: Senokot-S ?Take 1 tablet by mouth 2 (two) times daily. ?  ?Spiriva HandiHaler 18 MCG inhalation capsule ?Generic drug: tiotro

## 2021-04-17 NOTE — Progress Notes (Signed)
OT Cancellation Note ? ?Patient Details ?Name: [name] ?MRN: [id number] ?DOB: [birth year] ? ? ?Cancelled Treatment:    Reason Eval/Treat Not Completed: Patient declined, no reason specified (Patient states she is leaving to go to SNF and is expected to leave soon) ?Lodema Hong, OTA ?Acute Rehabilitation Services  ?Pager [phone] ?Office [phone] ? ?[place] ?04/17/2021, 1:07 PM ?

## 2021-04-17 NOTE — TOC Transition Note (Signed)
Transition of Care (TOC) - CM/SW Discharge Note ? ? ?Patient Details  ?Name: [name] ?MRN: [id number] ?Date of Birth: 1945-07-29 ? ?Transition of Care (TOC) CM/SW Contact:  ?Paulene Floor Deysi Soldo, LCSWA ?Phone Number: ?04/17/2021, 10:13 AM ? ? ?Clinical Narrative:    ?Patient will DC to: [facility] ?Anticipated DC date:  04/17/2021 ?Family notified:  Yes ?Transport by: Corey Harold ? ? ?Per MD patient ready for DC to SNF. RN to call report prior to discharge [phone] room 2500. RN, patient, patient's family, and facility notified of DC. Discharge Summary and FL2 sent to facility. DC packet on chart. Ambulance transport will be requested for patient.  ? ?CSW will sign off for now as social work intervention is no longer needed. Please consult [place] again if new needs arise. ?  ? ? ?Final next level of care: [place] ?  ? ? ?Patient Goals and CMS Choice ?  ?  ?  ? ?Discharge Placement ?  ?           ?Patient chooses bed at:  [facility]) ?Patient to be transferred to facility by: [facility] ?Name of family member notified: Aalani, Aikens (Son)   [phone] ?Patient and family notified of of transfer: 04/17/21 ? ?Discharge Plan and Services ?  ?  ?           ?  ?  ?  ?  ?  ?  ?  ?  ?  ?  ? ?Social Determinants of Health (SDOH) Interventions ?  ? ? ?Readmission Risk Interventions ?   ? View : No data to display.  ?  ?  ?  ? ? ? ? ? ?

## 2021-04-17 NOTE — Progress Notes (Signed)
Patient refused CPAP hs patient stated her home unit CPAP not working properly and she says she needs another sleep study. Patient on [place] tolerating well.  ?

## 2021-04-22 NOTE — Progress Notes (Signed)
?Cardiology Clinic Note  ? ?Patient Name: [name] ?Date of Encounter: 04/24/2021 ? ?Primary Care Provider:  [name], NP ?Primary Cardiologist:  Evalina Field, MD ? ?Patient Profile  ?  ?76 y.o. female history of CAD with known CTO of RCA , chronic diastolic CHF, chronic lower extremity edema with chronic wounds,  hypertension, hyperlipidemia, type 2 diabetes mellitus on insulin, pheochromocytoma s/p resection, COPD, chronic hypoxic respiratory failure following COVID-19 infection on home O2, obstructive sleep apnea on CPAP, CKD stage III, chronic pain syndrome, anemia of chronic disease, and morbid obesity with BMI >60 now with new atrial fib. Recently discharged from hospital on 04/17/2021 after admission for hypotension, new onset atrial fibrillation, acute kidney injury, chronic pain syndrome with multiple wounds on lower leg. ? ?Past Medical History  ?  ?Past Medical History:  ?Diagnosis Date  ? Coronary artery disease   ? Diabetes mellitus without complication ([place])   ? Hyperlipidemia   ? Hypertension   ? OSA (obstructive sleep apnea)   ? ?Past Surgical History:  ?Procedure Laterality Date  ? CARDIAC CATHETERIZATION    ? pheochromocytoma    ? TONSILLECTOMY    ? ? ?Allergies ? ?Allergies  ?Allergen Reactions  ? Metaxalone Anaphylaxis  ? Nitroglycerin   ?  BP bottoms out  ? Tramadol-Acetaminophen Anaphylaxis  ? Icosapent Ethyl Other (See Comments)  ?  Stomach pain, gas, diarrhea, headache from Vascepa  ? Brompheniramine-Pseudoeph   ?  Feels drunk  ? Meprobamate Other (See Comments)  ?  unknown  ? Metformin Nausea Only  ?  "flushes"  ? Methyldopa   ?  Other reaction(s): Unknown  ? Metoprolol   ?  Other reaction(s): Unknown  ? Procaine   ?  Passes out  ? Pseudoephedrine Hcl   ?  "feel drunk"  ? Tramadol   ?  "Made me go wonky, got fluid in my lungs"  ? ? ?History of Present Illness  ?  ?[name] is a 76 year old female we are seeing on follow-up after discharge from the hospital on 04/17/2021 in  the setting of acute kidney injury, hypotension, new onset atrial fibrillation, hypertension, with other history to include chronic pain syndrome, OSA on CPAP, and type 2 diabetes.  She was started on anticoagulation with Eliquis, CHADS VASC Score of 4. Plan for OP DCCV after 3-4 weeks of anticoagulation. She was also treated for multiple leg wounds without infection. ? ?She comes today very anxious, requesting a lengthy explanation concerning atrial fibrillation to increase her understanding of this diagnosis.  She denies any palpitations or chest pain at this time.  She does have chronic pain in bilateral lower extremities as result of cellulitis and wound care.  She continues to be a resident of [facility] since discharge 1 week ago.  She is tolerating medication regimen without evidence of bleeding (hemoptysis, melena, epistaxis, or excessive bruising). ? ?Home Medications  ?  ?Current Outpatient Medications  ?Medication Sig Dispense Refill  ? acetaminophen (TYLENOL) 500 MG tablet Take 500 mg by mouth every 8 (eight) hours as needed for moderate pain or headache.    ? albuterol (VENTOLIN HFA) 108 (90 Base) MCG/ACT inhaler Inhale 2 puffs into the lungs every 6 (six) hours as needed for wheezing or shortness of breath.    ? apixaban (ELIQUIS) 5 MG TABS tablet Take 1 tablet (5 mg total) by mouth 2 (two) times daily. 60 tablet   ? aspirin EC 81 MG tablet Take 81 mg by mouth daily. Swallow  whole.    ? BREO ELLIPTA 200-25 MCG/INH AEPB Inhale 1 puff into the lungs daily.    ? cetirizine (ZYRTEC) 10 MG tablet Take 10 mg by mouth daily.    ? Cholecalciferol (VITAMIN D3) 125 MCG (5000 UT) CAPS Take 5,000 Units by mouth.    ? diclofenac Sodium (VOLTAREN) 1 % GEL Apply 2-4 g topically in the morning, at noon, and at bedtime.    ? diltiazem (CARDIZEM SR) 60 MG 12 hr capsule Take 1 capsule (60 mg total) by mouth every 12 (twelve) hours.    ? ferrous sulfate 325 (65 FE) MG tablet Take 1 tablet (325 mg total) by mouth  daily with breakfast.  3  ? fluticasone (FLONASE) 50 MCG/ACT nasal spray Place 1 spray into both nostrils daily.    ? glucose 4 GM chewable tablet Chew 1 tablet by mouth daily as needed for low blood sugar.    ? HYDROcodone-acetaminophen (NORCO/VICODIN) 5-325 MG tablet Take 1 tablet by mouth every 6 (six) hours as needed for moderate pain. 10 tablet 0  ? insulin NPH-regular Human (70-30) 100 UNIT/ML injection Inject 73 Units into the skin with breakfast, with lunch, and with evening meal. Hold if CBG less than 120 or if resident is going to eat less than 50 % of meal.    ? ipratropium-albuterol (DUONEB) 0.5-2.5 (3) MG/3ML SOLN Take 3 mLs by nebulization every 6 (six) hours as needed (wheezing).    ? Ixekizumab 80 MG/ML SOAJ Inject 1 Dose into the skin every 30 (thirty) days.    ? Lidocaine 4 % PTCH Apply 1 application. topically daily. Left shoulder    ? liver oil-zinc oxide (DESITIN) 40 % ointment Apply 1 application. topically daily.    ? melatonin 5 MG TABS Take 1 tablet (5 mg total) by mouth at bedtime. 30 tablet 0  ? methocarbamol (ROBAXIN) 500 MG tablet Take 500 mg by mouth every 6 (six) hours as needed for muscle spasms.    ? Multiple Vitamin (MULTIVITAMIN ADULT PO) Take 1 tablet by mouth daily.    ? ondansetron (ZOFRAN) 4 MG tablet Take 4 mg by mouth every 8 (eight) hours as needed for nausea or vomiting.    ? polyethylene glycol (MIRALAX / GLYCOLAX) 17 g packet Take 17 g by mouth daily as needed for mild constipation.    ? pregabalin (LYRICA) 25 MG capsule Take 1 capsule (25 mg total) by mouth 2 (two) times daily. 60 capsule 1  ? rOPINIRole (REQUIP) 0.25 MG tablet Take 0.25 mg by mouth daily as needed (RLS).    ? rosuvastatin (CRESTOR) 20 MG tablet Take 20 mg by mouth at bedtime.    ? senna-docusate (SENOKOT-S) 8.6-50 MG tablet Take 1 tablet by mouth 2 (two) times daily.    ? tiotropium (SPIRIVA HANDIHALER) 18 MCG inhalation capsule Place 18 mcg into inhaler and inhale daily.    ? torsemide 40 MG TABS Take  40 mg by mouth daily.    ? vitamin B-12 (CYANOCOBALAMIN) 1000 MCG tablet Take 1,000 mcg by mouth daily.    ? ?No current facility-administered medications for this visit.  ?  ? ?Family History  ?  ?Family History  ?Problem Relation Age of Onset  ? Heart attack Mother   ? Stroke Mother   ? Heart attack Father   ? ?She indicated that the status of her mother is unknown. She indicated that the status of her father is unknown. ? ?Social History  ?  ?Social History  ? ?  Socioeconomic History  ? Marital status: Widowed  ?  Spouse name: Not on file  ? Number of children: Not on file  ? Years of education: Not on file  ? Highest education level: Not on file  ?Occupational History  ? Occupation: retired  ?Tobacco Use  ? Smoking status: Former  ?  Packs/day: 2.00  ?  Years: 10.00  ?  Pack years: 20.00  ?  Types: Cigarettes  ?  Quit date: 08/15/1989  ?  Years since quitting: 31.7  ? Smokeless tobacco: Never  ?Substance and Sexual Activity  ? Alcohol use: Not Currently  ? Drug use: Not Currently  ? Sexual activity: Not on file  ?Other Topics Concern  ? Not on file  ?Social History Narrative  ? Not on file  ? ?Social Determinants of Health  ? ?Financial Resource Strain: Not on file  ?Food Insecurity: Not on file  ?Transportation Needs: Not on file  ?Physical Activity: Not on file  ?Stress: Not on file  ?Social Connections: Not on file  ?Intimate Partner Violence: Not on file  ?  ? ?Review of Systems  ?  ?General:  No chills, fever, night sweats or weight changes.  ?Cardiovascular:  No chest pain, dyspnea on exertion, + edema bilateral lower extremities with weeping, pain, no orthopnea, palpitations, paroxysmal nocturnal dyspnea. ?Dermatological: No rash, lesions/masses.  Pain with wounds . ?respiratory: No cough, dyspnea ?Urologic: No hematuria, dysuria ?Abdominal:   No nausea, vomiting, diarrhea, bright red blood per rectum, melena, or hematemesis ?Neurologic:  No visual changes, wkns, changes in mental status. ?All other systems  reviewed and are otherwise negative except as noted above. ? ?  ?Physical Exam  ?  ?VS:  BP 112/62   Pulse 95   Ht 5' 2.5" (1.588 m)   Wt (!) 311 lb 3.2 oz (141.2 kg)   SpO2 98%   BMI 56.01 kg/m?  , BMI Bo

## 2021-04-24 ENCOUNTER — Encounter: Payer: Self-pay | Admitting: Adult Health

## 2021-04-24 ENCOUNTER — Ambulatory Visit (INDEPENDENT_AMBULATORY_CARE_PROVIDER_SITE_OTHER): Payer: Medicare Other | Admitting: Adult Health

## 2021-04-24 VITALS — BP 112/62 | HR 95 | Ht 62.5 in | Wt 311.2 lb

## 2021-04-24 DIAGNOSIS — J9611 Chronic respiratory failure with hypoxia: Secondary | ICD-10-CM | POA: Diagnosis not present

## 2021-04-24 DIAGNOSIS — E11649 Type 2 diabetes mellitus with hypoglycemia without coma: Secondary | ICD-10-CM

## 2021-04-24 DIAGNOSIS — Z9981 Dependence on supplemental oxygen: Secondary | ICD-10-CM

## 2021-04-24 DIAGNOSIS — U099 Post covid-19 condition, unspecified: Secondary | ICD-10-CM

## 2021-04-24 DIAGNOSIS — Z9989 Dependence on other enabling machines and devices: Secondary | ICD-10-CM

## 2021-04-24 DIAGNOSIS — Z86018 Personal history of other benign neoplasm: Secondary | ICD-10-CM

## 2021-04-24 DIAGNOSIS — I5032 Chronic diastolic (congestive) heart failure: Secondary | ICD-10-CM | POA: Diagnosis not present

## 2021-04-24 DIAGNOSIS — I10 Essential (primary) hypertension: Secondary | ICD-10-CM | POA: Diagnosis not present

## 2021-04-24 DIAGNOSIS — G4733 Obstructive sleep apnea (adult) (pediatric): Secondary | ICD-10-CM

## 2021-04-24 DIAGNOSIS — E7800 Pure hypercholesterolemia, unspecified: Secondary | ICD-10-CM

## 2021-04-24 DIAGNOSIS — I2510 Atherosclerotic heart disease of native coronary artery without angina pectoris: Secondary | ICD-10-CM

## 2021-04-24 NOTE — Patient Instructions (Addendum)
Medication Instructions:  ?No changes ?*If you need a refill on your cardiac medications before your next appointment, please call your pharmacy* ? ? ?Lab Work: ?BMET, CBC In 1 month. ?If you have labs (blood work) drawn today and your tests are completely normal, you will receive your results only by: ?MyChart Message (if you have MyChart) OR ?A paper copy in the mail ?If you have any lab test that is abnormal or we need to change your treatment, we will call you to review the results. ? ? ?Testing/Procedures: ?No Testing ? ? ?Follow-Up: ?At [facility], you and your health needs are our priority.  As part of our continuing mission to provide you with exceptional heart care, we have created designated Provider Care Teams.  These Care Teams include your primary Cardiologist (physician) and Advanced Practice Providers (APPs -  Physician Assistants and Nurse Practitioners) who all work together to provide you with the care you need, when you need it. ? ?We recommend signing up for the patient portal called "MyChart".  Sign up information is provided on this After Visit Summary.  MyChart is used to connect with patients for Virtual Visits (Telemedicine).  Patients are able to view lab/test results, encounter notes, upcoming appointments, etc.  Non-urgent messages can be sent to your provider as well.   ?To learn more about what you can do with MyChart, go to [email or web address].   ? ?Your next appointment:   ?1 month(s) ? ?The format for your next appointment:   ?In Person ? ?Provider:   ?Evalina Field, MD   ? ? ?  ?

## 2021-04-28 ENCOUNTER — Telehealth: Payer: Self-pay | Admitting: Cardiovascular Disease

## 2021-04-28 NOTE — Telephone Encounter (Signed)
Spoke with patient who resides at [facility]. She reports her wounds are being dressed by a nurse and she is concerned that she may have another infection. She will see the PA tomorrow at her facility.  ? ?From 04/24/21 Curt Bears NP note: ?6.  Bilateral cellulitis lower extremity: The patient is having a lot of pain in her lower extremities bilaterally, dressing has been replaced as it was weeping.  We will follow-up with wound care at skilled nursing facility.  Currently not on antibiotics for this as she completed her regimen during hospitalization.  She would like to be connected with a vascular cardiologist for further evaluation.  She has had trouble with nonhealing wounds in the past, per patient there was concern about circulation and decreased blood flow in the setting of insulin-dependent diabetes.  I feel that we should wait until full healing has occurred with lower extremity cellulitis before moving forward with any vascular testing to include ABIs at this time.  However if she is not improving may need to consider evaluation.  Will defer to Dr. Audie Box to discuss further with her at his discretion. ? ?Advised will route to Dr. Audie Box to review ?

## 2021-04-28 NOTE — Telephone Encounter (Signed)
Pt states that she would like a referral to see Dr. Gwenlyn Found. Pt states that she has severe cellulitis, and afraid of it getting infected. Please advise ?

## 2021-04-29 ENCOUNTER — Other Ambulatory Visit: Payer: Self-pay

## 2021-04-29 DIAGNOSIS — M79605 Pain in left leg: Secondary | ICD-10-CM

## 2021-04-29 NOTE — Telephone Encounter (Signed)
Spoke to the patient. She stated that she is being treated for an infection. Her facility is starting antibiotics for her.  ? ?She was wondering if she could be seen sooner than 4/27 with Dr. Gwenlyn Found. She was advised that was the first available. She was offered a sooner appointment with Dr. Fletcher Anon but declined stating that she was referred to Dr. Gwenlyn Found only.  ? ?She has been advised to call back if she changes her mind.  ?  ?

## 2021-04-29 NOTE — Telephone Encounter (Signed)
Referral placed to see Dr.Berry for PV consult.  ? ?

## 2021-05-05 ENCOUNTER — Other Ambulatory Visit: Payer: Self-pay

## 2021-05-05 ENCOUNTER — Emergency Department (HOSPITAL_BASED_OUTPATIENT_CLINIC_OR_DEPARTMENT_OTHER): Payer: Medicare Other | Admitting: Radiology

## 2021-05-05 ENCOUNTER — Observation Stay (HOSPITAL_BASED_OUTPATIENT_CLINIC_OR_DEPARTMENT_OTHER)
Admission: EM | Admit: 2021-05-05 | Discharge: 2021-05-06 | Disposition: A | Discharge status: Skilled Nursing Facility | Payer: Medicare Other | Attending: Internal Medicine | Admitting: Internal Medicine

## 2021-05-05 ENCOUNTER — Encounter (HOSPITAL_BASED_OUTPATIENT_CLINIC_OR_DEPARTMENT_OTHER): Payer: Self-pay

## 2021-05-05 DIAGNOSIS — Z7901 Long term (current) use of anticoagulants: Secondary | ICD-10-CM | POA: Diagnosis not present

## 2021-05-05 DIAGNOSIS — I872 Venous insufficiency (chronic) (peripheral): Secondary | ICD-10-CM | POA: Diagnosis present

## 2021-05-05 DIAGNOSIS — I87313 Chronic venous hypertension (idiopathic) with ulcer of bilateral lower extremity: Secondary | ICD-10-CM | POA: Diagnosis not present

## 2021-05-05 DIAGNOSIS — J9611 Chronic respiratory failure with hypoxia: Secondary | ICD-10-CM

## 2021-05-05 DIAGNOSIS — Z87891 Personal history of nicotine dependence: Secondary | ICD-10-CM | POA: Insufficient documentation

## 2021-05-05 DIAGNOSIS — G4733 Obstructive sleep apnea (adult) (pediatric): Secondary | ICD-10-CM

## 2021-05-05 DIAGNOSIS — Z794 Long term (current) use of insulin: Secondary | ICD-10-CM

## 2021-05-05 DIAGNOSIS — I110 Hypertensive heart disease with heart failure: Secondary | ICD-10-CM | POA: Diagnosis not present

## 2021-05-05 DIAGNOSIS — G894 Chronic pain syndrome: Secondary | ICD-10-CM | POA: Diagnosis present

## 2021-05-05 DIAGNOSIS — E876 Hypokalemia: Secondary | ICD-10-CM | POA: Diagnosis not present

## 2021-05-05 DIAGNOSIS — E119 Type 2 diabetes mellitus without complications: Secondary | ICD-10-CM | POA: Diagnosis not present

## 2021-05-05 DIAGNOSIS — J9612 Chronic respiratory failure with hypercapnia: Secondary | ICD-10-CM | POA: Diagnosis not present

## 2021-05-05 DIAGNOSIS — L97929 Non-pressure chronic ulcer of unspecified part of left lower leg with unspecified severity: Secondary | ICD-10-CM | POA: Insufficient documentation

## 2021-05-05 DIAGNOSIS — J449 Chronic obstructive pulmonary disease, unspecified: Secondary | ICD-10-CM | POA: Diagnosis not present

## 2021-05-05 DIAGNOSIS — L98499 Non-pressure chronic ulcer of skin of other sites with unspecified severity: Secondary | ICD-10-CM | POA: Diagnosis not present

## 2021-05-05 DIAGNOSIS — Z7982 Long term (current) use of aspirin: Secondary | ICD-10-CM | POA: Diagnosis not present

## 2021-05-05 DIAGNOSIS — I5032 Chronic diastolic (congestive) heart failure: Secondary | ICD-10-CM

## 2021-05-05 DIAGNOSIS — I482 Chronic atrial fibrillation, unspecified: Secondary | ICD-10-CM | POA: Diagnosis not present

## 2021-05-05 DIAGNOSIS — I2510 Atherosclerotic heart disease of native coronary artery without angina pectoris: Secondary | ICD-10-CM | POA: Insufficient documentation

## 2021-05-05 DIAGNOSIS — L03116 Cellulitis of left lower limb: Principal | ICD-10-CM

## 2021-05-05 DIAGNOSIS — I10 Essential (primary) hypertension: Secondary | ICD-10-CM

## 2021-05-05 DIAGNOSIS — Z6841 Body Mass Index (BMI) 40.0 and over, adult: Secondary | ICD-10-CM | POA: Diagnosis not present

## 2021-05-05 DIAGNOSIS — E1159 Type 2 diabetes mellitus with other circulatory complications: Secondary | ICD-10-CM

## 2021-05-05 DIAGNOSIS — Z9989 Dependence on other enabling machines and devices: Secondary | ICD-10-CM

## 2021-05-05 LAB — BASIC METABOLIC PANEL
Anion gap: 15 (ref 5–15)
BUN: 39 mg/dL — ABNORMAL HIGH (ref 8–23)
CO2: 35 mmol/L — ABNORMAL HIGH (ref 22–32)
Calcium: 9.6 mg/dL (ref 8.9–10.3)
Chloride: 93 mmol/L — ABNORMAL LOW (ref 98–111)
Creatinine, Ser: 1.2 mg/dL — ABNORMAL HIGH (ref 0.44–1.00)
GFR, Estimated: 48 mL/min — ABNORMAL LOW (ref >60)
Glucose, Bld: 139 mg/dL — ABNORMAL HIGH (ref 70–99)
Potassium: 2.6 mmol/L — CL (ref 3.5–5.1)
Sodium: 143 mmol/L (ref 135–145)

## 2021-05-05 LAB — GLUCOSE, CAPILLARY: Glucose-Capillary: 153 mg/dL — ABNORMAL HIGH (ref 70–99)

## 2021-05-05 LAB — CBC WITH DIFFERENTIAL/PLATELET
Abs Immature Granulocytes: 0.05 10*3/uL (ref 0.00–0.07)
Basophils Absolute: 0.1 10*3/uL (ref 0.0–0.1)
Basophils Relative: 0 %
Eosinophils Absolute: 0.1 10*3/uL (ref 0.0–0.5)
Eosinophils Relative: 1 %
HCT: 33.8 % — ABNORMAL LOW (ref 36.0–46.0)
Hemoglobin: 9.9 g/dL — ABNORMAL LOW (ref 12.0–15.0)
Immature Granulocytes: 0 %
Lymphocytes Relative: 9 %
Lymphs Abs: 1.1 10*3/uL (ref 0.7–4.0)
MCH: 24.4 pg — ABNORMAL LOW (ref 26.0–34.0)
MCHC: 29.3 g/dL — ABNORMAL LOW (ref 30.0–36.0)
MCV: 83.5 fL (ref 80.0–100.0)
Monocytes Absolute: 0.9 10*3/uL (ref 0.1–1.0)
Monocytes Relative: 7 %
Neutro Abs: 10.7 10*3/uL — ABNORMAL HIGH (ref 1.7–7.7)
Neutrophils Relative %: 83 %
Platelets: 346 10*3/uL (ref 150–400)
RBC: 4.05 MIL/uL (ref 3.87–5.11)
RDW: 18.4 % — ABNORMAL HIGH (ref 11.5–15.5)
WBC: 12.8 10*3/uL — ABNORMAL HIGH (ref 4.0–10.5)
nRBC: 0 % (ref 0.0–0.2)

## 2021-05-05 LAB — TROPONIN I (HIGH SENSITIVITY): Troponin I (High Sensitivity): 61 ng/L — ABNORMAL HIGH (ref <18)

## 2021-05-05 LAB — MAGNESIUM: Magnesium: 1.6 mg/dL — ABNORMAL LOW (ref 1.7–2.4)

## 2021-05-05 MED ORDER — POTASSIUM CHLORIDE 10 MEQ/100ML IV SOLN
10.0000 meq | INTRAVENOUS | Status: AC
  Administered 2021-05-05 (×2): 10 meq via INTRAVENOUS
  Filled 2021-05-05 (×3): qty 100

## 2021-05-05 MED ORDER — HYDROMORPHONE HCL 1 MG/ML IJ SOLN
1.0000 mg | Freq: Once | INTRAMUSCULAR | Status: AC
  Administered 2021-05-05: 1 mg via INTRAVENOUS
  Filled 2021-05-05: qty 1

## 2021-05-05 MED ORDER — APIXABAN 5 MG PO TABS
5.0000 mg | ORAL_TABLET | Freq: Two times a day (BID) | ORAL | Status: DC
  Administered 2021-05-05 – 2021-05-06 (×2): 5 mg via ORAL
  Filled 2021-05-05 (×2): qty 1

## 2021-05-05 MED ORDER — DILTIAZEM HCL 25 MG/5ML IV SOLN
10.0000 mg | Freq: Once | INTRAVENOUS | Status: AC
  Administered 2021-05-05: 10 mg via INTRAVENOUS
  Filled 2021-05-05: qty 5

## 2021-05-05 MED ORDER — INSULIN ASPART 100 UNIT/ML IJ SOLN
0.0000 [IU] | Freq: Every day | INTRAMUSCULAR | Status: DC
  Administered 2021-05-05: 2 [IU] via SUBCUTANEOUS

## 2021-05-05 MED ORDER — HYDROCODONE-ACETAMINOPHEN 5-325 MG PO TABS
1.0000 | ORAL_TABLET | Freq: Four times a day (QID) | ORAL | Status: DC | PRN
  Administered 2021-05-06 (×2): 1 via ORAL
  Filled 2021-05-05 (×2): qty 1

## 2021-05-05 MED ORDER — MAGNESIUM SULFATE 2 GM/50ML IV SOLN
2.0000 g | Freq: Once | INTRAVENOUS | Status: AC
  Administered 2021-05-05: 2 g via INTRAVENOUS
  Filled 2021-05-05: qty 50

## 2021-05-05 MED ORDER — POTASSIUM CHLORIDE CRYS ER 20 MEQ PO TBCR
40.0000 meq | EXTENDED_RELEASE_TABLET | Freq: Once | ORAL | Status: AC
Start: 2021-05-05 — End: 2021-05-05
  Administered 2021-05-05: 40 meq via ORAL
  Filled 2021-05-05: qty 2

## 2021-05-05 MED ORDER — INSULIN ASPART 100 UNIT/ML IJ SOLN
0.0000 [IU] | Freq: Three times a day (TID) | INTRAMUSCULAR | Status: DC
  Administered 2021-05-06: 11 [IU] via SUBCUTANEOUS
  Administered 2021-05-06: 4 [IU] via SUBCUTANEOUS
  Administered 2021-05-06: 7 [IU] via SUBCUTANEOUS

## 2021-05-05 MED ORDER — METHYLPREDNISOLONE 2 MG PO TABS
4.0000 mg | ORAL_TABLET | Freq: Every day | ORAL | Status: DC
  Filled 2021-05-05: qty 2

## 2021-05-05 MED ORDER — ROPINIROLE HCL 0.25 MG PO TABS
0.2500 mg | ORAL_TABLET | Freq: Every day | ORAL | Status: DC
  Administered 2021-05-05: 0.25 mg via ORAL
  Filled 2021-05-05: qty 1

## 2021-05-05 MED ORDER — INSULIN DETEMIR 100 UNIT/ML ~~LOC~~ SOLN
10.0000 [IU] | Freq: Every day | SUBCUTANEOUS | Status: DC
  Administered 2021-05-05: 10 [IU] via SUBCUTANEOUS
  Filled 2021-05-05 (×2): qty 0.1

## 2021-05-05 MED ORDER — SODIUM CHLORIDE 0.9 % IV SOLN
2.0000 g | Freq: Once | INTRAVENOUS | Status: AC
  Administered 2021-05-05: 2 g via INTRAVENOUS
  Filled 2021-05-05: qty 20

## 2021-05-05 MED ORDER — MAGNESIUM OXIDE -MG SUPPLEMENT 400 (240 MG) MG PO TABS
400.0000 mg | ORAL_TABLET | Freq: Once | ORAL | Status: AC
  Administered 2021-05-05: 400 mg via ORAL
  Filled 2021-05-05: qty 1

## 2021-05-05 MED ORDER — ONDANSETRON HCL 4 MG/2ML IJ SOLN
4.0000 mg | Freq: Once | INTRAMUSCULAR | Status: AC
  Administered 2021-05-05: 4 mg via INTRAVENOUS
  Filled 2021-05-05: qty 2

## 2021-05-05 MED ORDER — UMECLIDINIUM BROMIDE 62.5 MCG/ACT IN AEPB
1.0000 | INHALATION_SPRAY | Freq: Every day | RESPIRATORY_TRACT | Status: DC
  Administered 2021-05-06: 1 via RESPIRATORY_TRACT
  Filled 2021-05-05: qty 7

## 2021-05-05 MED ORDER — ROSUVASTATIN CALCIUM 10 MG PO TABS
20.0000 mg | ORAL_TABLET | Freq: Every day | ORAL | Status: DC
  Administered 2021-05-05: 20 mg via ORAL
  Filled 2021-05-05: qty 2

## 2021-05-05 MED ORDER — MORPHINE SULFATE (PF) 4 MG/ML IV SOLN
4.0000 mg | Freq: Once | INTRAVENOUS | Status: AC
  Administered 2021-05-05: 4 mg via INTRAVENOUS
  Filled 2021-05-05: qty 1

## 2021-05-05 MED ORDER — PREGABALIN 25 MG PO CAPS
25.0000 mg | ORAL_CAPSULE | Freq: Two times a day (BID) | ORAL | Status: DC
  Administered 2021-05-05: 25 mg via ORAL
  Filled 2021-05-05: qty 1

## 2021-05-05 MED ORDER — ACETAMINOPHEN 650 MG RE SUPP: 650.0000 mg | Freq: Four times a day (QID) | RECTAL | Status: DC | PRN

## 2021-05-05 MED ORDER — ONDANSETRON HCL 4 MG/2ML IJ SOLN: 4.0000 mg | Freq: Four times a day (QID) | INTRAMUSCULAR | Status: DC | PRN

## 2021-05-05 MED ORDER — METHYLPREDNISOLONE 4 MG PO TABS
8.0000 mg | ORAL_TABLET | Freq: Every day | ORAL | Status: DC
  Administered 2021-05-06: 8 mg via ORAL
  Filled 2021-05-05: qty 2
  Filled 2021-05-05: qty 4
  Filled 2021-05-05: qty 2

## 2021-05-05 MED ORDER — ACETAMINOPHEN 325 MG PO TABS
650.0000 mg | ORAL_TABLET | Freq: Four times a day (QID) | ORAL | Status: DC | PRN
  Administered 2021-05-06: 650 mg via ORAL
  Filled 2021-05-05: qty 2

## 2021-05-05 MED ORDER — IPRATROPIUM-ALBUTEROL 0.5-2.5 (3) MG/3ML IN SOLN: 3.0000 mL | Freq: Four times a day (QID) | RESPIRATORY_TRACT | Status: DC | PRN

## 2021-05-05 MED ORDER — DILTIAZEM HCL 60 MG PO TABS
60.0000 mg | ORAL_TABLET | Freq: Two times a day (BID) | ORAL | Status: DC
Start: 2021-05-05 — End: 2021-05-06
  Administered 2021-05-05 – 2021-05-06 (×2): 60 mg via ORAL
  Filled 2021-05-05 (×3): qty 1

## 2021-05-05 MED ORDER — ONDANSETRON HCL 4 MG PO TABS: 4.0000 mg | ORAL_TABLET | Freq: Four times a day (QID) | ORAL | Status: DC | PRN

## 2021-05-05 MED ORDER — ASPIRIN EC 81 MG PO TBEC
81.0000 mg | DELAYED_RELEASE_TABLET | Freq: Every day | ORAL | Status: DC
  Administered 2021-05-06: 81 mg via ORAL
  Filled 2021-05-05: qty 1

## 2021-05-05 MED ORDER — TIOTROPIUM BROMIDE MONOHYDRATE 18 MCG IN CAPS: 18.0000 ug | ORAL_CAPSULE | Freq: Every day | RESPIRATORY_TRACT | Status: DC

## 2021-05-05 MED ORDER — FLUTICASONE FUROATE-VILANTEROL 200-25 MCG/ACT IN AEPB
1.0000 | INHALATION_SPRAY | Freq: Every day | RESPIRATORY_TRACT | Status: DC
  Administered 2021-05-06: 1 via RESPIRATORY_TRACT
  Filled 2021-05-05: qty 28

## 2021-05-05 NOTE — ED Notes (Signed)
Attempted to get bloodwork without success ?

## 2021-05-05 NOTE — ED Provider Notes (Signed)
?[facility] DE[facility] ?Provider Note ? ? ?CSN: [id number] ?Arrival date & time: 05/05/21  1215 ? ?  ? ?History ? ?Chief Complaint  ?Patient presents with  ? Wound Check  ? ? ?[name] is a 76 y.o. female. ? ?Patient with history of CHF, COPD, diabetes, atrial fibrillation on anticoagulation, chronic open wounds on lower extremities --presents to the emergency department for worsening pain and drainage, especially of the left lower extremity.  Patient states that her symptoms have been worse for about 1 week.  She has been on scheduled doxycycline over the past 6 days since 4/12.  No fevers, chest pain or shortness of breath.  She has been taking hydrocodone prescribed at her facility every 4 hours without improvement.  She states difficulty sleeping due to severe pain.  At baseline, she has bandaging on the lower leg wounds due to weeping.  Left leg especially is more red over the past few days.  Sent in by her facility today for worsening symptoms.  She also reports recent UTI, on fosfomycin for this. ? ? ?  ? ?Home Medications ?Prior to Admission medications   ?Medication Sig Start Date End Date Taking? Authorizing Provider  ?acetaminophen (TYLENOL) 500 MG tablet Take 500 mg by mouth every 8 (eight) hours as needed for moderate pain or headache.    [provider]  ?albuterol (VENTOLIN HFA) 108 (90 Base) MCG/ACT inhaler Inhale 2 puffs into the lungs every 6 (six) hours as needed for wheezing or shortness of breath.    [provider]  ?apixaban (ELIQUIS) 5 MG TABS tablet Take 1 tablet (5 mg total) by mouth 2 (two) times daily. 04/17/21   Caren Griffins, MD  ?aspirin EC 81 MG tablet Take 81 mg by mouth daily. Swallow whole.    [provider]  ?[place] Patter 200-25 MCG/INH AEPB Inhale 1 puff into the lungs daily. 04/22/20   [provider]  ?cetirizine (ZYRTEC) 10 MG tablet Take 10 mg by mouth daily.    [provider]  ?Cholecalciferol (VITAMIN D3)  125 MCG (5000 UT) CAPS Take 5,000 Units by mouth.    [provider]  ?diclofenac Sodium (VOLTAREN) 1 % GEL Apply 2-4 g topically in the morning, at noon, and at bedtime.    [provider]  ?diltiazem (CARDIZEM SR) 60 MG 12 hr capsule Take 1 capsule (60 mg total) by mouth every 12 (twelve) hours. 04/17/21   Caren Griffins, MD  ?ferrous sulfate 325 (65 FE) MG tablet Take 1 tablet (325 mg total) by mouth daily with breakfast. 02/06/21   [name], MD  ?fluticasone (FLONASE) 50 MCG/ACT nasal spray Place 1 spray into both nostrils daily.    [provider]  ?glucose 4 GM chewable tablet Chew 1 tablet by mouth daily as needed for low blood sugar.    [provider]  ?HYDROcodone-acetaminophen (NORCO/VICODIN) 5-325 MG tablet Take 1 tablet by mouth every 6 (six) hours as needed for moderate pain. 04/17/21   Caren Griffins, MD  ?insulin NPH-regular Human (70-30) 100 UNIT/ML injection Inject 73 Units into the skin with breakfast, with lunch, and with evening meal. Hold if CBG less than 120 or if resident is going to eat less than 50 % of meal.    [provider]  ?ipratropium-albuterol (DUONEB) 0.5-2.5 (3) MG/3ML SOLN Take 3 mLs by nebulization every 6 (six) hours as needed (wheezing).    [provider]  ?Ixekizumab 80 MG/ML SOAJ Inject 1 Dose into  the skin every 30 (thirty) days.    [provider]  ?Lidocaine 4 % PTCH Apply 1 application. topically daily. Left shoulder    [provider]  ?liver oil-zinc oxide (DESITIN) 40 % ointment Apply 1 application. topically daily.    [provider]  ?melatonin 5 MG TABS Take 1 tablet (5 mg total) by mouth at bedtime. 02/05/21   [name], MD  ?methocarbamol (ROBAXIN) 500 MG tablet Take 500 mg by mouth every 6 (six) hours as needed for muscle spasms.    [provider]  ?Multiple Vitamin (MULTIVITAMIN ADULT PO) Take 1 tablet by mouth daily.    [provider]   ?ondansetron (ZOFRAN) 4 MG tablet Take 4 mg by mouth every 8 (eight) hours as needed for nausea or vomiting.    [provider]  ?polyethylene glycol (MIRALAX / GLYCOLAX) 17 g packet Take 17 g by mouth daily as needed for mild constipation.    [provider]  ?pregabalin (LYRICA) 25 MG capsule Take 1 capsule (25 mg total) by mouth 2 (two) times daily. 04/02/21   Cherene Altes, MD  ?rOPINIRole (REQUIP) 0.25 MG tablet Take 0.25 mg by mouth daily as needed (RLS).    [provider]  ?rosuvastatin (CRESTOR) 20 MG tablet Take 20 mg by mouth at bedtime.    [provider]  ?senna-docusate (SENOKOT-S) 8.6-50 MG tablet Take 1 tablet by mouth 2 (two) times daily. 02/05/21   [name], MD  ?tiotropium (SPIRIVA HANDIHALER) 18 MCG inhalation capsule Place 18 mcg into inhaler and inhale daily.    [provider]  ?torsemide 40 MG TABS Take 40 mg by mouth daily. 04/17/21   Caren Griffins, MD  ?vitamin B-12 (CYANOCOBALAMIN) 1000 MCG tablet Take 1,000 mcg by mouth daily.    [provider]  ?   ? ?Allergies    ?Metaxalone, Nitroglycerin, Tramadol-acetaminophen, Icosapent ethyl, Brompheniramine-pseudoeph, Meprobamate, Metformin, Methyldopa, Metoprolol, Procaine, Pseudoephedrine hcl, and Tramadol   ? ?Review of Systems   ?Review of Systems ? ?Physical Exam ?Updated Vital Signs ?BP 127/84 (BP Location: Right Wrist)   Pulse 98   Temp 97.8 ?F (36.6 ?C) (Oral)   Resp 18   Ht 5' 2.5" (1.588 m)   Wt (!) 141.2 kg   SpO2 99%   BMI 56.03 kg/m?  ?Physical Exam ?Vitals and nursing note reviewed.  ?Constitutional:   ?   General: She is not in acute distress. ?   Appearance: She is well-developed.  ?HENT:  ?   Head: Normocephalic and atraumatic.  ?   Right Ear: External ear normal.  ?   Left Ear: External ear normal.  ?   Nose: Nose normal.  ?Eyes:  ?   Conjunctiva/sclera: Conjunctivae normal.  ?Cardiovascular:  ?   Rate and Rhythm: Normal rate and regular rhythm.  ?    Pulses:     ?     Dorsalis pedis pulses are detected w/ Doppler on the right side and detected w/ Doppler on the left side.  ?   Heart sounds: No murmur heard. ?   Comments: With pitting edema of the bilateral lower extremities and feet.  Bedside Doppler ultrasound used to find DP pulses bilaterally. ?Pulmonary:  ?   Effort: No respiratory distress.  ?   Breath sounds: No wheezing, rhonchi or rales.  ?Abdominal:  ?   Palpations: Abdomen is soft.  ?   Tenderness: There is no abdominal tenderness. There is no guarding or rebound.  ?Musculoskeletal:     ?  General: Tenderness present.  ?   Cervical back: Normal range of motion and neck supple.  ?   Right lower leg: Pitting Edema present.  ?   Left lower leg: Pitting Edema present.  ?   Comments: Patient with open wounds of the bilateral ankles.  She is particularly tender on the left side, especially posterior ankle and calf.  There is erythema extending proximally with a wound on the left leg with associated warmth.  No lymphangitis.  Honey colored crusting both lower extremity wounds.   ?Skin: ?   General: Skin is warm and dry.  ?   Findings: No rash.  ?Neurological:  ?   General: No focal deficit present.  ?   Mental Status: She is alert. Mental status is at baseline.  ?   Motor: No weakness.  ?Psychiatric:     ?   Mood and Affect: Mood normal.  ? ?Left lower leg: ? ? ?Left lower leg:  ? ? ?Right lower leg: ? ? ? ?ED Results / Procedures / Treatments   ?Labs ?(all labs ordered are listed, but only abnormal results are displayed) ?Labs Reviewed  ?CBC WITH DIFFERENTIAL/PLATELET - Abnormal; Notable for the following components:  ?    Result Value  ? WBC 12.8 (*)   ? Hemoglobin 9.9 (*)   ? HCT 33.8 (*)   ? MCH 24.4 (*)   ? MCHC 29.3 (*)   ? RDW 18.4 (*)   ? Neutro Abs 10.7 (*)   ? All other components within normal limits  ?BASIC METABOLIC PANEL - Abnormal; Notable for the following components:  ? Potassium 2.6 (*)   ? Chloride 93 (*)   ? CO2 35 (*)   ? Glucose, Bld  139 (*)   ? BUN 39 (*)   ? Creatinine, Ser 1.20 (*)   ? GFR, Estimated 48 (*)   ? All other components within normal limits  ?MAGNESIUM  ? ? ?EKG ?None ? ?Radiology ?DG Tibia/Fibula Left ? ?Result Date: 05/05/2021 ?CLINICAL

## 2021-05-05 NOTE — ED Notes (Signed)
Pharmacy contacted regarding Verification of Dilaudid for Administration. Pharmacist to verify shortly for this RN to administer. ?

## 2021-05-05 NOTE — Assessment & Plan Note (Addendum)
Presented in rapid afib, pt without her afib meds most of the day of presentation. Given 1 dose IV cardizem and restart her po cardizem. Continued eliquis. Electrolytes corrected ?

## 2021-05-05 NOTE — Progress Notes (Signed)
?   05/05/21 2123  ?Assess: MEWS Score  ?BP 114/86  ?Pulse Rate (!) 113  ?ECG Heart Rate (!) 114  ?Resp (!) 7 ?(pt sleep and manually counted by RN)  ?SpO2 96 %  ?Patient Activity (if Appropriate) In bed  ?O2 Flow Rate (L/min) 3.5 L/min  ?Assess: MEWS Score  ?MEWS Temp 0  ?MEWS Systolic 0  ?MEWS Pulse 2  ?MEWS RR 2  ?MEWS LOC 0  ?MEWS Score 4  ?MEWS Score Color Red  ?Treat  ?MEWS Interventions Escalated (See documentation below)  ?Pain Scale 0-10  ?Pain Score Asleep  ?Take Vital Signs  ?Increase Vital Sign Frequency  Red: Q 1hr X 4 then Q 4hr X 4, if remains red, continue Q 4hrs  ?Escalate  ?MEWS: Escalate Red: discuss with charge nurse/RN and provider, consider discussing with RRT  ?Notify: Charge Nurse/RN  ?Date Charge Nurse/RN Notified 05/05/21  ?Time Charge Nurse/RN Notified 2030  ?Notify: Provider  ?Provider Name/Title Dr. Bridgett Larsson  ?Date Provider Notified 05/05/21  ?Time Provider Notified 2000  ?Notification Type Face-to-face  ?Notification Reason Change in status  ?Provider response At bedside;See new orders  ?Date of Provider Response 05/05/21  ?Time of Provider Response 2000  ?Notify: Rapid Response  ?Name of Rapid Response RN Notified Diego Cory  ?Date Rapid Response Notified 05/05/21  ?Time Rapid Response Notified 2035  ?Document  ?Patient Outcome Stabilized after interventions ?(at 2030 patient showing afib with rvr on monitor.  nurse went to check on patient and she stated she had chest pressure and shob.  vital signs and EKG obtained.  rapid nurse called at 2035.  pt given cardizem '10mg'$  IV. pt resting quietly with no c/o pain.)  ?Assess: SIRS CRITERIA  ?SIRS Temperature  0  ?SIRS Pulse 1  ?SIRS Respirations  0  ?SIRS WBC 1  ?SIRS Score Sum  2  ? ? ?

## 2021-05-05 NOTE — Assessment & Plan Note (Signed)
Chronic. 

## 2021-05-05 NOTE — Assessment & Plan Note (Signed)
Continue cpap.  

## 2021-05-05 NOTE — Assessment & Plan Note (Addendum)
BP stable and controlled ?

## 2021-05-05 NOTE — Assessment & Plan Note (Addendum)
Pt had altered mental status resulting in hospitalization in 03-30-2021 due to overly aggressive opiate prescribing at the SNF. ?

## 2021-05-05 NOTE — H&P (Signed)
?History and Physical  ? ? ?[name] JIR:[id number] DOB: 12/18/1945 DOA: 05/05/2021 ? ?DOS: the patient was seen and examined on 05/05/2021 ? ?PCP: [name], NP  ? ?Patient coming from: SNF [facility] ? ?I have personally briefly reviewed patient's old medical records in [place] ? ?CC: leg wounds ?HPI: ?76 year old white female with a history of super morbid obesity BMI greater than 50, type 2 diabetes, hypertension, OSA on CPAP, chronic hypoxic and hypercapnic respiratory failure.  On 3 to 6 L/min of home O2.  Chronic pain syndrome on opiates, chronic bilateral lower extremity wounds felt to be due to venous insufficiency/stasis dermatitis, chronic diastolic heart failure, A-fib on Cardizem and Eliquis presents to the ER with reported draining of her wounds.  Wounds have been present for at least 8 months now.  She has been seen by wound care clinic by Dr. Fredirick Maudlin on 04/06/2021.  During that visit, her impression was the patient had stasis dermatitis/venous insufficiency.  There was dopplerable pulses in both the dorsalis pedis and the posterior tibialis bilaterally.  Patient states that she is unable to tolerate compression stockings/Unna boots.  Dr. Celine Ahr did not feel very hopeful that these wounds would heal without compression. ? ?Patient went to the ER today as she has been on a round of doxycycline without improvement of her wounds. ? ?On arrival temp 97.8 heart rate 98 blood pressure 02/13/1982 satting 99%. ? ?Labs White count 12.8, hemoglobin of 9.9, platelets 346 ? ?Sodium of 143, potassium 2.6, bicarb of 35, BUN of 39 creatinine of 1.2 magnesium 1.6 ? ?[facility] hospitalist contacted for admission for "cellulitis".  ? ?ED Course: Noted to be hypokalemic, hypomagnesemic.  No fever. ? ?Review of Systems:  ?Review of Systems  ?Unable to perform ROS: Mental acuity patient been given sedation in the emergency department with IV opiates.  Patient unable to stay awake to have a  conversation. ? ?Past Medical History:  ?Diagnosis Date  ? Coronary artery disease   ? Diabetes mellitus without complication ([place])   ? Hyperlipidemia   ? Hypertension   ? OSA (obstructive sleep apnea)   ? ? ?Past Surgical History:  ?Procedure Laterality Date  ? CARDIAC CATHETERIZATION    ? pheochromocytoma    ? TONSILLECTOMY    ? ? ? reports that she quit smoking about 31 years ago. Her smoking use included cigarettes. She has a 20.00 pack-year smoking history. She has never used smokeless tobacco. She reports that she does not currently use alcohol. She reports that she does not currently use drugs. ? ?Allergies  ?Allergen Reactions  ? Metaxalone Anaphylaxis  ? Nitroglycerin   ?  BP bottoms out  ? Tramadol-Acetaminophen Anaphylaxis  ? Icosapent Ethyl Other (See Comments)  ?  Stomach pain, gas, diarrhea, headache from Vascepa  ? Brompheniramine-Pseudoeph   ?  Feels drunk  ? Meprobamate Other (See Comments)  ?  unknown  ? Metformin Nausea Only  ?  "flushes"  ? Methyldopa   ?  Other reaction(s): Unknown  ? Metoprolol   ?  Other reaction(s): Unknown  ? Procaine   ?  Passes out  ? Pseudoephedrine Hcl   ?  "feel drunk"  ? Tramadol   ?  "Made me go wonky, got fluid in my lungs"  ? ? ?Family History  ?Problem Relation Age of Onset  ? Heart attack Mother   ? Stroke Mother   ? Heart attack Father   ? ? ?Prior to Admission medications   ?Medication Sig  Start Date End Date Taking? Authorizing Provider  ?acetaminophen (TYLENOL) 500 MG tablet Take 500 mg by mouth every 8 (eight) hours as needed for moderate pain or headache.   Yes [provider]  ?apixaban (ELIQUIS) 5 MG TABS tablet Take 1 tablet (5 mg total) by mouth 2 (two) times daily. 04/17/21  Yes Caren Griffins, MD  ?aspirin EC 81 MG tablet Take 81 mg by mouth daily. Swallow whole.   Yes [provider]  ?[place] Patter 200-25 MCG/INH AEPB Inhale 1 puff into the lungs daily. 04/22/20  Yes [provider]  ?cefdinir (OMNICEF) 300 MG capsule Take  300 mg by mouth 2 (two) times daily.   Yes [provider]  ?cetirizine (ZYRTEC) 10 MG tablet Take 10 mg by mouth daily.   Yes [provider]  ?Cholecalciferol (VITAMIN D3) 125 MCG (5000 UT) CAPS Take 5,000 Units by mouth daily.   Yes [provider]  ?diclofenac Sodium (VOLTAREN) 1 % GEL Apply 4 g topically in the morning, at noon, and at bedtime. Apply to bilateral knees for arthritic pain   Yes [provider]  ?diltiazem (CARDIZEM SR) 60 MG 12 hr capsule Take 1 capsule (60 mg total) by mouth every 12 (twelve) hours. 04/17/21  Yes Caren Griffins, MD  ?doxycycline (VIBRA-TABS) 100 MG tablet Take 100 mg by mouth 2 (two) times daily.   Yes [provider]  ?estradiol (ESTRACE) 0.1 MG/GM vaginal cream Place 1 Applicatorful vaginally daily.   Yes [provider]  ?ferrous sulfate 325 (65 FE) MG tablet Take 1 tablet (325 mg total) by mouth daily with breakfast. 02/06/21  Yes [name], MD  ?fluticasone (FLONASE) 50 MCG/ACT nasal spray Place 1 spray into both nostrils daily.   Yes [provider]  ?HYDROcodone-acetaminophen (NORCO/VICODIN) 5-325 MG tablet Take 1 tablet by mouth every 6 (six) hours as needed for moderate pain. ?Patient taking differently: Take 1 tablet by mouth every 4 (four) hours as needed for severe pain. 04/17/21  Yes Caren Griffins, MD  ?insulin NPH-regular Human (70-30) 100 UNIT/ML injection Inject 73 Units into the skin with breakfast, with lunch, and with evening meal. Hold if CBG less than 120 or if resident is going to eat less than 50 % of meal.   Yes [provider]  ?Magnesium Hydroxide (MILK OF MAGNESIA PO) Take 30 mLs by mouth daily. Every 24 hrs for constipation   Yes [provider]  ?methylPREDNISolone (MEDROL) 4 MG tablet Take 4 mg by mouth daily. 4 mg qam and 8 mg hs per paperwork from [facility]   Yes [provider]  ?Multiple Vitamin (MULTIVITAMIN ADULT PO) Take 1 tablet by mouth  daily.   Yes [provider]  ?pregabalin (LYRICA) 25 MG capsule Take 1 capsule (25 mg total) by mouth 2 (two) times daily. 04/02/21  Yes Cherene Altes, MD  ?rOPINIRole (REQUIP) 0.25 MG tablet Take 0.25 mg by mouth daily as needed (RLS).   Yes [provider]  ?rosuvastatin (CRESTOR) 20 MG tablet Take 20 mg by mouth at bedtime.   Yes [provider]  ?senna-docusate (SENOKOT-S) 8.6-50 MG tablet Take 1 tablet by mouth 2 (two) times daily. 02/05/21  Yes [name], MD  ?Sodium Phosphates (FLEET ENEMA RE) Place rectally See admin instructions. Administer x 1 dose now for constipation,every 24 hrs as needed for constipation not relieved by MOM or dulcolax   Yes [provider]  ?tiotropium (SPIRIVA HANDIHALER) 18 MCG inhalation capsule Place 18  mcg into inhaler and inhale daily.   Yes [provider]  ?torsemide 40 MG TABS Take 40 mg by mouth daily. ?Patient taking differently: Take 60 mg by mouth daily. For chf 04/17/21  Yes Gherghe, Vella Redhead, MD  ?vitamin B-12 (CYANOCOBALAMIN) 1000 MCG tablet Take 1,000 mcg by mouth daily.   Yes [provider]  ?Wound Dressings (MEDIHONEY CA ALGINATE 4"X5") PADS Apply topically See admin instructions. Cleanse wound to lle and rle with ns,pat dry,apply medihoney,cover with protective dressing and ace bandage,change twice daily and prn for soiled or dislodged dressing   Yes [provider]  ?Zinc Oxide 40 % PSTE Apply topically See admin instructions. Apply to reddened area on groin topically everyday shift for redness irritation   Yes [provider]  ?albuterol (VENTOLIN HFA) 108 (90 Base) MCG/ACT inhaler Inhale 2 puffs into the lungs every 6 (six) hours as needed for wheezing or shortness of breath.    [provider]  ?glucose 4 GM chewable tablet Chew 1 tablet by mouth daily as needed for low blood sugar.    [provider]  ?ipratropium-albuterol (DUONEB) 0.5-2.5 (3) MG/3ML SOLN Take  3 mLs by nebulization every 6 (six) hours as needed (wheezing).    [provider]  ?Ixekizumab 80 MG/ML SOAJ Inject 1 Dose into the skin every 30 (thirty) days.    [provider]  ?Lidocaine 4 % P

## 2021-05-05 NOTE — Plan of Care (Signed)
?  Called to admit this patient for left lower extremity infection who is presenting from a nursing facility. She has chronic weeping wounds on her legs and has had increasing redness and pain in left leg. She was receiving Doxycycline at the SNF for the past 6 days and Cefdinir was started yesterday but she has not been improving. Ceftriaxone was ordered by the EDP. K+ is low and 40 meq oral and 30 meq of IV replacement has been ordered. I will accept her to a telemetry bed in light of her hypokalemia.  ?

## 2021-05-05 NOTE — Progress Notes (Addendum)
Rapid Response Event Note  ? ?Reason for Call : c/o chest pain & Afib RVR w/ PMH ? ? ?Initial Focused Assessment:  ?Pt a&Ox4. Lethargic (recent administration of '1mg'$  dilaudid @ 1779). Pt able to speak in complete sentence w/o distress. C/o 8/10 pain in center of chest described as pressure. Denies pain in arms/back. Pt on 3 1/2L [place] (3L @ baseline). Pt reports feeling anxious and feelings of panic. Pt reports not taking anything for anxiety at home. ? ?IV Cardizem was effective - BP initially dropped from 155/109 (115) to 98/61 (72) causing pt to feel dizzy. BP recovered to 114/86 (94) 10 min later. HR went from 138 to 87 then leveled out at 114. ?Dizziness has resolved once BP returned. ? ?Interventions: ?'10mg'$  Cardizem IV push x1 ?Mg+ & K+ replacement ?Stat troponin labs taken (result: 61) ? ?Plan of Care: ?Give PO Cardizem & continue to monitor vitals ?Give PM meds then attempt CPAP to combat OSA ? ? ?Event Summary:  ?Pt reported anxiety so this RR RN stayed w/ pt until she was able to fall asleep ? ?MD Notified: E.Chen & J. Olena Heckle ?Call Time: 2035p ?Arrival Time: 2043p ?End Time: 2145p ? ?Quincy Simmonds, RN ?

## 2021-05-05 NOTE — Assessment & Plan Note (Addendum)
Continue supplemental oxygen.  Keep O2 sats 88 to 92%. ?Pt weaned to baseline 3LNC ?

## 2021-05-05 NOTE — Assessment & Plan Note (Signed)
Euvolemic. 

## 2021-05-05 NOTE — Assessment & Plan Note (Signed)
Chronic. BMI 56. Weight 141.2 kg. ?

## 2021-05-05 NOTE — Progress Notes (Signed)
Spoke with pt regarding cpap use.  Pt stated she is very claustrophobic and can only tolerate nasal pillow.  Pt initially refused but is willing to try but unsure if she will be able to wear it long.  This Probation officer encouraged its use and benefits.  Pt is currently wearing cpap and tolerating ok for now.  RN aware. ?

## 2021-05-05 NOTE — Assessment & Plan Note (Addendum)
Observation medical bed. Pt's ulcers noted to be chronic and did not appear infected. Pt has been seen by wound care. Pt with stasis dermatitis/venous insufficiency. Pt refuses to wear compression bandages. Wound care was not hopeful these wounds would heal without compression. I agree. Pt doesn't seem to understand that she needs compression to help with edema in her legs. Pt has remained afebrile and wounds again appear chronic. WBC noted to be mildly elevated at 15k. Discussed case with ID who recommends 2 weeks of cephalexin ?

## 2021-05-05 NOTE — ED Triage Notes (Signed)
Patient BIB [facility] from [facility] for Wounds. ? ?Endorses Bilateral Lower Extremity Wounds that have been present and ongoing for approximately 8 months per [facility]. Wounds have been draining and more painful as of recently and Emergency Services was contacted. ? ?VSS with [facility] besides BP of 98 Systolic Palp. Patient wears 3L [place] at Baseline. ? ?NAD Noted during Triage. A&Ox4. GCS 15. BIB [facility]. ? ? ?

## 2021-05-05 NOTE — Subjective & Objective (Addendum)
CC: leg wounds ?HPI: ?76 year old white female with a history of super morbid obesity BMI greater than 50, type 2 diabetes, hypertension, OSA on CPAP, chronic hypoxic and hypercapnic respiratory failure.  On 3 to 6 L/min of home O2.  Chronic pain syndrome on opiates, chronic bilateral lower extremity wounds felt to be due to venous insufficiency/stasis dermatitis, chronic diastolic heart failure, A-fib on Cardizem and Eliquis presents to the ER with reported draining of her wounds.  Wounds have been present for at least 8 months now.  She has been seen by wound care clinic by Dr. Fredirick Maudlin on 04/06/2021.  During that visit, her impression was the patient had stasis dermatitis/venous insufficiency.  There was dopplerable pulses in both the dorsalis pedis and the posterior tibialis bilaterally.  Patient states that she is unable to tolerate compression stockings/Unna boots.  Dr. Celine Ahr did not feel very hopeful that these wounds would heal without compression. ? ?Patient went to the ER today as she has been on a round of doxycycline without improvement of her wounds. ? ?On arrival temp 97.8 heart rate 98 blood pressure 02/13/1982 satting 99%. ? ?Labs White count 12.8, hemoglobin of 9.9, platelets 346 ? ?Sodium of 143, potassium 2.6, bicarb of 35, BUN of 39 creatinine of 1.2 magnesium 1.6 ? ?[facility] hospitalist contacted for admission for "cellulitis". ?

## 2021-05-06 DIAGNOSIS — I872 Venous insufficiency (chronic) (peripheral): Secondary | ICD-10-CM | POA: Diagnosis not present

## 2021-05-06 DIAGNOSIS — L98499 Non-pressure chronic ulcer of skin of other sites with unspecified severity: Secondary | ICD-10-CM

## 2021-05-06 DIAGNOSIS — G894 Chronic pain syndrome: Secondary | ICD-10-CM | POA: Diagnosis not present

## 2021-05-06 DIAGNOSIS — L03116 Cellulitis of left lower limb: Secondary | ICD-10-CM | POA: Diagnosis not present

## 2021-05-06 LAB — COMPREHENSIVE METABOLIC PANEL
ALT: 27 U/L (ref 0–44)
AST: 30 U/L (ref 15–41)
Albumin: 3.1 g/dL — ABNORMAL LOW (ref 3.5–5.0)
Alkaline Phosphatase: 49 U/L (ref 38–126)
Anion gap: 11 (ref 5–15)
BUN: 42 mg/dL — ABNORMAL HIGH (ref 8–23)
CO2: 32 mmol/L (ref 22–32)
Calcium: 8.6 mg/dL — ABNORMAL LOW (ref 8.9–10.3)
Chloride: 97 mmol/L — ABNORMAL LOW (ref 98–111)
Creatinine, Ser: 1.29 mg/dL — ABNORMAL HIGH (ref 0.44–1.00)
GFR, Estimated: 44 mL/min — ABNORMAL LOW (ref >60)
Glucose, Bld: 195 mg/dL — ABNORMAL HIGH (ref 70–99)
Potassium: 3.8 mmol/L (ref 3.5–5.1)
Sodium: 140 mmol/L (ref 135–145)
Total Bilirubin: 0.4 mg/dL (ref 0.3–1.2)
Total Protein: 7.5 g/dL (ref 6.5–8.1)

## 2021-05-06 LAB — CBC WITH DIFFERENTIAL/PLATELET
Abs Immature Granulocytes: 0.14 10*3/uL — ABNORMAL HIGH (ref 0.00–0.07)
Basophils Absolute: 0.1 10*3/uL (ref 0.0–0.1)
Basophils Relative: 1 %
Eosinophils Absolute: 0.1 10*3/uL (ref 0.0–0.5)
Eosinophils Relative: 1 %
HCT: 35.8 % — ABNORMAL LOW (ref 36.0–46.0)
Hemoglobin: 10.4 g/dL — ABNORMAL LOW (ref 12.0–15.0)
Immature Granulocytes: 1 %
Lymphocytes Relative: 7 %
Lymphs Abs: 1.1 10*3/uL (ref 0.7–4.0)
MCH: 24.8 pg — ABNORMAL LOW (ref 26.0–34.0)
MCHC: 29.1 g/dL — ABNORMAL LOW (ref 30.0–36.0)
MCV: 85.2 fL (ref 80.0–100.0)
Monocytes Absolute: 1.1 10*3/uL — ABNORMAL HIGH (ref 0.1–1.0)
Monocytes Relative: 7 %
Neutro Abs: 12.8 10*3/uL — ABNORMAL HIGH (ref 1.7–7.7)
Neutrophils Relative %: 83 %
Platelets: 345 10*3/uL (ref 150–400)
RBC: 4.2 MIL/uL (ref 3.87–5.11)
RDW: 18.2 % — ABNORMAL HIGH (ref 11.5–15.5)
WBC: 15.3 10*3/uL — ABNORMAL HIGH (ref 4.0–10.5)
nRBC: 0 % (ref 0.0–0.2)

## 2021-05-06 LAB — GLUCOSE, CAPILLARY
Glucose-Capillary: 159 mg/dL — ABNORMAL HIGH (ref 70–99)
Glucose-Capillary: 249 mg/dL — ABNORMAL HIGH (ref 70–99)
Glucose-Capillary: 291 mg/dL — ABNORMAL HIGH (ref 70–99)

## 2021-05-06 LAB — MAGNESIUM: Magnesium: 2.2 mg/dL (ref 1.7–2.4)

## 2021-05-06 MED ORDER — CEPHALEXIN 500 MG PO CAPS
500.0000 mg | ORAL_CAPSULE | Freq: Four times a day (QID) | ORAL | 0 refills | Status: AC
Start: 2021-05-06 — End: 2021-05-20

## 2021-05-06 MED ORDER — HYDROCERIN EX CREA
TOPICAL_CREAM | Freq: Two times a day (BID) | CUTANEOUS | Status: DC
  Filled 2021-05-06: qty 113

## 2021-05-06 MED ORDER — POTASSIUM CHLORIDE 10 MEQ/100ML IV SOLN
10.0000 meq | INTRAVENOUS | Status: AC
  Administered 2021-05-06 (×2): 10 meq via INTRAVENOUS
  Filled 2021-05-06 (×2): qty 100

## 2021-05-06 NOTE — NC FL2 (Signed)
?[place] MEDICAID FL2 LEVEL OF CARE SCREENING TOOL  ?  ? ?IDENTIFICATION  ?Patient Name: ?[name] Birthdate: 1945/03/06 Sex: female Admission Date (Current Location): ?05/05/2021  ?[place] and [place] Number: ? [place] ?  Facility and Address:  ?[facility],  [place], [place] ?     Provider Number: ?[id number]  ?Attending Physician Name and Address:  ?Donne Hazel, MD ? Relative Name and Phone Number:  ?Ruthanna Macchia  [facility])   [phone] ?   ?Current Level of Care: ?Hospital Recommended Level of Care: ?[place] Prior Approval Number: ?  ? ?Date Approved/Denied: ?02/02/21 PASRR Number: ?[id number] ? ?Discharge Plan: ?SNF ?  ? ?Current Diagnoses: ?Patient Active Problem List  ? Diagnosis Date Noted  ? Ulcer of extremity due to chronic venous insufficiency ([place]) 05/05/2021  ? Chronic pain syndrome 04/06/2021  ? Atrial fibrillation, chronic ([place]) 04/06/2021  ? Multiple open wounds of lower leg 04/06/2021  ? Chronic diastolic CHF (congestive heart failure) ([place])   ? Leg wound, left 02/01/2021  ? DM2 (diabetes mellitus, type 2) ([place]) 05/03/2020  ? Chronic respiratory failure with hypoxia and hypercapnia ([place]) 05/03/2020  ? Cervical disc prolapse with radiculopathy 12/07/2019  ? History of pheochromocytoma 12/07/2019  ? 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 OSA on CPAP 12/07/2019  ? 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 Gait abnormality 06/19/2019  ? 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 TIA (transient ischemic attack) 03/22/2017  ? 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 Dizzy 12/28/2011  ? CAD (coronary artery disease), native coronary artery 12/05/2010  ? Morbid obesity ([place]) 12/05/2010  ? Essential hypertension 12/03/2010  ? Hyperlipidemia 12/03/2010  ? UI (urinary incontinence) 12/03/2010  ? ? ?Orientation RESPIRATION BLADDER Height & Weight   ?  ?Self, Time, Situation, Place ? Normal Incontinent Weight: (!)  141.2 kg ?Height:  5' 2.5" (158.8 cm)  ?BEHAVIORAL SYMPTOMS/MOOD NEUROLOGICAL BOWEL NUTRITION STATUS  ?    Continent Diet (heart healthy/carb modified)  ?AMBULATORY STATUS COMMUNICATION OF NEEDS Skin   ?Extensive Assist Verbally Other (Comment) (lower extremity wounds) ?  ?  ?  ?    ?     ?     ? ? ?Personal Care Assistance Level of Assistance  ?Bathing, Feeding, Dressing Bathing Assistance: Limited assistance ?Feeding assistance: Limited assistance ?Dressing Assistance: Limited assistance ?   ? ?Functional Limitations Info  ?Sight, Hearing, Speech Sight Info: Adequate ?Hearing Info: Adequate ?Speech Info: Adequate  ? ? ?SPECIAL CARE FACTORS FREQUENCY  ?PT (By licensed PT), OT (By licensed OT)   ?  ?PT Frequency: 5x/week ?OT Frequency: 5x/week ?  ?  ?  ?   ? ? ?Contractures Contractures Info: Not present  ? ? ?Additional Factors Info  ?Code Status, Allergies, Isolation Precautions Code Status Info: Full ?Allergies Info: Metaxalone, Nitroglycerin, Tramadol-acetaminophen, Icosapent Ethyl, Brompheniramine-pseudoeph, Meprobamate, Metformin, Methyldopa, Metoprolol, Procaine, Pseudoephedrine Hcl, Tramadol ?  ?  ?Isolation Precautions Info: Nasal swab + MRSA ?   ? ?Current Medications (05/06/2021):  This is the current hospital active medication list ?Current Facility-Administered Medications  ?Medication Dose Route Frequency Provider Last Rate Last Admin  ? acetaminophen (TYLENOL) tablet 650 mg  650 mg Oral Q6H PRN Kristopher Oppenheim, DO   650 mg at 05/06/21 0455  ? Or  ? acetaminophen (TYLENOL) suppository 650 mg  650 mg Rectal Q6H PRN Kristopher Oppenheim, DO      ? apixaban (ELIQUIS) tablet 5 mg  5 mg Oral BID Kristopher Oppenheim, DO   5  mg at 05/05/21 2144  ? aspirin EC tablet 81 mg  81 mg Oral Daily Kristopher Oppenheim, DO      ? diltiazem (CARDIZEM) tablet 60 mg  60 mg Oral Q12H Kristopher Oppenheim, DO   60 mg at 05/05/21 2145  ? fluticasone furoate-vilanterol (BREO ELLIPTA) 200-25 MCG/ACT 1 puff  1 puff Inhalation Daily Kristopher Oppenheim, DO   1 puff at 05/06/21 3716   ? hydrocerin (EUCERIN) cream   Topical BID Kristopher Oppenheim, DO      ? HYDROcodone-acetaminophen (NORCO/VICODIN) 5-325 MG per tablet 1 tablet  1 tablet Oral Q6H PRN Kristopher Oppenheim, DO   1 tablet at 05/06/21 0114  ? insulin aspart (novoLOG) injection 0-20 Units  0-20 Units Subcutaneous TID WC Kristopher Oppenheim, DO   4 Units at 05/06/21 9678  ? insulin aspart (novoLOG) injection 0-5 Units  0-5 Units Subcutaneous QHS Kristopher Oppenheim, DO   2 Units at 05/05/21 2146  ? insulin detemir (LEVEMIR) injection 10 Units  10 Units Subcutaneous QHS Kristopher Oppenheim, DO   10 Units at 05/05/21 2324  ? ipratropium-albuterol (DUONEB) 0.5-2.5 (3) MG/3ML nebulizer solution 3 mL  3 mL Nebulization Q6H PRN Kristopher Oppenheim, DO      ? methylPREDNISolone (MEDROL) tablet 4 mg  4 mg Oral Daily Kristopher Oppenheim, DO      ? methylPREDNISolone (MEDROL) tablet 8 mg  8 mg Oral QHS Kristopher Oppenheim, DO   8 mg at 05/06/21 0114  ? ondansetron (ZOFRAN) tablet 4 mg  4 mg Oral Q6H PRN Kristopher Oppenheim, DO      ? Or  ? ondansetron (ZOFRAN) injection 4 mg  4 mg Intravenous Q6H PRN Kristopher Oppenheim, DO      ? rOPINIRole (REQUIP) tablet 0.25 mg  0.25 mg Oral QHS Kristopher Oppenheim, DO   0.25 mg at 05/05/21 2145  ? rosuvastatin (CRESTOR) tablet 20 mg  20 mg Oral QHS Kristopher Oppenheim, DO   20 mg at 05/05/21 2144  ? umeclidinium bromide (INCRUSE ELLIPTA) 62.5 MCG/ACT 1 puff  1 puff Inhalation Daily Kristopher Oppenheim, DO   1 puff at 05/06/21 0908  ? ? ? ?Discharge Medications: ?Please see discharge summary for a list of discharge medications. ? ?Relevant Imaging Results: ? ?Relevant Lab Results: ? ? ?Additional Information ?SS# [id number]; covid-19 vaccine - Pfizer x2 ? ?Tawanna Cooler, RN ? ? ? ? ?

## 2021-05-06 NOTE — TOC Progression Note (Signed)
Transition of Care (TOC) - Progression Note  ? ? ?Patient Details  ?Name: Neeley Sedivy ?MRN: [id number] ?Date of Birth: September 08, 1945 ? ?Transition of Care (TOC) CM/SW Contact  ?Tawanna Cooler, RN ?Phone Number: ?05/06/2021, 10:40 AM ? ?Clinical Narrative:    ? ?Spoke with Claiborne Billings at [facility].  Patient is currently in the SNF level at their facility.  States if patient returns by 2 pm today, they will not need a PT eval or new admission, can continue her SNF stay under the previous admission.  If stays in the hospital past 2, will be a new admission and will need PT eval.   ? ?

## 2021-05-06 NOTE — Evaluation (Signed)
Occupational Therapy Evaluation ?Patient Details ?Name: [name] ?MRN: [id number] ?DOB: 06/13/1945 ?Today's Date: 05/06/2021 ? ? ?History of Present Illness Patient is a 76 year old female admitted with lower extremity wound drainage. PMH: super morbid obesity BMI greater than 50, type 2 diabetes, hypertension, OSA on CPAP, chronic hypoxic and hypercapnic respiratory failure.  On 3 to 6 L/min of home O2.  Chronic pain syndrome on opiates, chronic bilateral lower extremity wounds felt to be due to venous insufficiency/stasis dermatitis, chronic diastolic heart failure, A-fib  ? ?Clinical Impression ?  ?Patient reports she has been at skilled nursing side of [facility] for few months, working with both PT and OT services. Patient states she ambulates short distances with therapy using rollator, otherwise uses motorized wheelchair. Does need assistance with bathing and perianal care, can ambulate to/from bathroom and wears gowns mostly which she dons herself. Currently patient limited by pain in lower extremities, however with encouragement was able to transfer to recliner chair at supervision level using rolling walker. Acute OT to follow to work toward patient goals of maximizing upper body ROM in order to increase independence with self care tasks such as perianal care. Recommend return to SNF at D/C.   ?   ? ?Recommendations for follow up therapy are one component of a multi-disciplinary discharge planning process, led by the attending physician.  Recommendations may be updated based on patient status, additional functional criteria and insurance authorization.  ? ?Follow Up Recommendations ? Other (comment) (Return to SNF)  ?  ?Assistance Recommended at Discharge Frequent or constant Supervision/Assistance  ?Patient can return home with the following A little help with walking and/or transfers;A lot of help with bathing/dressing/bathroom ? ?  ?Functional Status Assessment ? Patient has had a recent decline in  their functional status and demonstrates the ability to make significant improvements in function in a reasonable and predictable amount of time.  ?Equipment Recommendations ? None recommended by OT  ?  ?   ?Precautions / Restrictions Precautions ?Precautions: Fall;Other (comment) ?Precaution Comments: LLE wound, 02, ?Restrictions ?Weight Bearing Restrictions: No  ? ?  ? ?Mobility Bed Mobility ?Overal bed mobility: Modified Independent ?  ?  ?  ?  ?  ?  ?General bed mobility comments: Increased time, use of bed rails and HOB elevated ?  ? ? ? ?  ?Balance Overall balance assessment: Needs assistance ?Sitting-balance support: Feet supported ?Sitting balance-Leahy Scale: Fair ?  ?  ?Standing balance support: Reliant on assistive device for balance ?Standing balance-Leahy Scale: Poor ?  ?  ?  ?  ?  ?  ?  ?  ?  ?  ?  ?  ?   ? ?ADL either performed or assessed with clinical judgement  ? ?ADL Overall ADL's : Needs assistance/impaired ?Eating/Feeding: Independent;Sitting ?  ?Grooming: Set up;Sitting ?  ?Upper Body Bathing: Minimal assistance;Sitting ?  ?Lower Body Bathing: Moderate assistance;Sit to/from stand;Sitting/lateral leans ?  ?Upper Body Dressing : Minimal assistance;Sitting ?  ?Lower Body Dressing: Total assistance;Sit to/from stand;Sitting/lateral leans ?Lower Body Dressing Details (indicate cue type and reason): To don socks ?Toilet Transfer: Supervision/safety;Stand-pivot;Rolling walker (2 wheels) ?Toilet Transfer Details (indicate cue type and reason): With increased time due to pain patient is able to power up to standing and pivot to recliner chair using walking without physical assistance ?Toileting- Clothing Manipulation and Hygiene: Moderate assistance;Sit to/from stand ?Toileting - Clothing Manipulation Details (indicate cue type and reason): Patient reports at baseline difficulty with perianal care "I can't reach" ?  ?  ?  Functional mobility during ADLs: Supervision/safety;Rolling walker (2  wheels) ?General ADL Comments: Patient appears close to her baseline with transfers and self care  ? ? ? ? ?Pertinent Vitals/Pain Pain Assessment ?Pain Assessment: Faces ?Faces Pain Scale: Hurts whole lot ?Pain Location: lower extremities ?Pain Descriptors / Indicators: Crying, Moaning ?Pain Intervention(s): Patient requesting pain meds-RN notified  ? ? ? ?Hand Dominance Right ?  ?Extremity/Trunk Assessment Upper Extremity Assessment ?Upper Extremity Assessment: RUE deficits/detail;LUE deficits/detail ?RUE Deficits / Details: AROM grossly within functional limits ?LUE Deficits / Details: AROM shoulder flexion limited ~130 degrees, exteral rotation limited ?  ?Lower Extremity Assessment ?Lower Extremity Assessment: Defer to PT evaluation ?  ?Cervical / Trunk Assessment ?Cervical / Trunk Assessment: Other exceptions ?Cervical / Trunk Exceptions: increased body habitus ?  ?Communication Communication ?Communication: No difficulties ?  ?Cognition Arousal/Alertness: Awake/alert ?Behavior During Therapy: [facility] for tasks assessed/performed ?Overall Cognitive Status: Within Functional Limits for tasks assessed ?  ?  ?  ?  ?  ?  ?  ?  ?  ?  ?  ?  ?  ?  ?  ?  ?General Comments: Initially stating its summer time but able to self correct. ?  ?  ?   ?   ?   ? ? ?Home Living Family/patient expects to be discharged to:: Skilled nursing facility ?Living Arrangements: Alone ?  ?  ?  ?  ?  ?  ?  ?  ?  ?  ?  ?  ?  ?  ?  ?Additional Comments: [facility] at rehab/skilled nursing. Has a power chair and rollator ?  ? ?  ?Prior Functioning/Environment Prior Level of Function : Needs assist ?  ?  ?  ?  ?  ?  ?Mobility Comments: Patient reports working with physical therapy. Walking limited distances with rollator with PT ?ADLs Comments: Patient reports dresses herself, has assistance with bathing, patient reports she can ambulate to toilet and get on/off but has assist with perianal care. OT working on range of motion ?  ? ?  ?  ?OT Problem  List: Decreased strength;Decreased range of motion;Decreased activity tolerance;Impaired balance (sitting and/or standing);Decreased knowledge of precautions;Decreased knowledge of use of DME or AE;Cardiopulmonary status limiting activity;Obesity;Pain ?  ?   ?OT Treatment/Interventions: Self-care/ADL training;Therapeutic exercise;Energy conservation;DME and/or AE instruction;Therapeutic activities;Patient/family education  ?  ?OT Goals(Current goals can be found in the care plan section) Acute Rehab OT Goals ?Patient Stated Goal: Less pain ?OT Goal Formulation: With patient ?Time For Goal Achievement: 05/20/21 ?Potential to Achieve Goals: Fair  ?OT Frequency: Min 2X/week ?  ? ?   ?AM-PAC OT "6 Clicks" Daily Activity     ?Outcome Measure Help from another person eating meals?: None ?Help from another person taking care of personal grooming?: A Little ?Help from another person toileting, which includes using toliet, bedpan, or urinal?: A Lot ?Help from another person bathing (including washing, rinsing, drying)?: A Lot ?Help from another person to put on and taking off regular upper body clothing?: A Little ?Help from another person to put on and taking off regular lower body clothing?: Total ?6 Click Score: 15 ?  ?End of Session Equipment Utilized During Treatment: Rolling walker (2 wheels);Oxygen ?Nurse Communication: Mobility status;Patient requests pain meds ? ?Activity Tolerance: Patient limited by pain ?Patient left: in chair;with call bell/phone within reach ? ?OT Visit Diagnosis: Unsteadiness on feet (R26.81);Other abnormalities of gait and mobility (R26.89);Muscle weakness (generalized) (M62.81)  ?              ?  Time: 1030-1056 ?OT Time Calculation (min): 26 min ?Charges:  OT General Charges ?$OT Visit: 1 Visit ?OT Treatments ?$Self Care/Home Management : 8-22 mins ? ?Delbert Phenix OT ?OT pager: [phone] ? ?Rosemary Holms ?05/06/2021, 11:33 AM ?

## 2021-05-06 NOTE — Hospital Course (Signed)
76 year old white female with a history of super morbid obesity BMI greater than 50, type 2 diabetes, hypertension, OSA on CPAP, chronic hypoxic and hypercapnic respiratory failure.  On 3 to 6 L/min of home O2.  Chronic pain syndrome on opiates, chronic bilateral lower extremity wounds felt to be due to venous insufficiency/stasis dermatitis, chronic diastolic heart failure, A-fib on Cardizem and Eliquis presents to the ER with reported draining of her wounds.  Wounds have been present for at least 8 months now.  She has been seen by wound care clinic by Dr. Fredirick Maudlin on 04/06/2021.  During that visit, her impression was the patient had stasis dermatitis/venous insufficiency.  There was dopplerable pulses in both the dorsalis pedis and the posterior tibialis bilaterally.  Patient states that she is unable to tolerate compression stockings/Unna boots.  Dr. Celine Ahr did not feel very hopeful that these wounds would heal without compression. ?  ?Patient presented to the ER today as she has been on a round of doxycycline without improvement of her wounds. ?

## 2021-05-06 NOTE — Evaluation (Signed)
Physical Therapy Evaluation ?Patient Details ?Name: [name] ?MRN: [id number] ?DOB: November 03, 1945 ?Today's Date: 05/06/2021 ? ?History of Present Illness ? Patient is a 76 year old female admitted with lower extremity wound drainage on 05/05/21. PMH: super morbid obesity BMI greater than 50, type 2 diabetes, hypertension, OSA on CPAP, chronic hypoxic and hypercapnic respiratory failure.  On 3 to 6 L/min of home O2.  Chronic pain syndrome on opiates, chronic bilateral lower extremity wounds felt to be due to venous insufficiency/stasis dermatitis, chronic diastolic heart failure, A-fib  ?Clinical Impression ? Pt admitted with above diagnosis. Pt was at SNF prior to admission working with therapy.  Today, pt ambulated short distance but significantly limited by LE pain.  Recommend pt return to SNF for continued therapy. Pt currently with functional limitations due to the deficits listed below (see PT Problem List). Pt will benefit from skilled PT to increase their independence and safety with mobility to allow discharge to the venue listed below.   ?   ?   ? ?Recommendations for follow up therapy are one component of a multi-disciplinary discharge planning process, led by the attending physician.  Recommendations may be updated based on patient status, additional functional criteria and insurance authorization. ? ?Follow Up Recommendations Skilled nursing-short term rehab (<3 hours/day) ? ?  ?Assistance Recommended at Discharge Frequent or constant Supervision/Assistance  ?Patient can return home with the following ? A little help with walking and/or transfers;A little help with bathing/dressing/bathroom;Assistance with cooking/housework ? ?  ?Equipment Recommendations None recommended by PT  ?Recommendations for Other Services ?    ?  ?Functional Status Assessment Patient has had a recent decline in their functional status and demonstrates the ability to make significant improvements in function in a reasonable and  predictable amount of time.  ? ?  ?Precautions / Restrictions Precautions ?Precautions: Fall;Other (comment) ?Precaution Comments: LLE wound, 02, ?Restrictions ?Weight Bearing Restrictions: No  ? ?  ? ?Mobility ? Bed Mobility ?  ?  ?  ?  ?  ?  ?  ?General bed mobility comments: in chair at arrival ; was Mod I with OT ?  ? ?Transfers ?Overall transfer level: Needs assistance ?Equipment used: Rolling walker (2 wheels) ?Transfers: Sit to/from Stand ?Sit to Stand: Min guard ?  ?  ?  ?  ?  ?General transfer comment: min guard for safety ?  ? ?Ambulation/Gait ?Ambulation/Gait assistance: Min guard ?Gait Distance (Feet): 18 Feet (18'x2) ?Assistive device: Rolling walker (2 wheels) ?Gait Pattern/deviations: Step-to pattern, Decreased stride length, Shuffle ?Gait velocity: decreased ?  ?  ?General Gait Details: Pt wearing bedroom shoes and shuffling feet; limited distance due to pain; ambulated to bathroom and back ? ?Stairs ?  ?  ?  ?  ?  ? ?Wheelchair Mobility ?  ? ?Modified Rankin (Stroke Patients Only) ?  ? ?  ? ?Balance Overall balance assessment: Needs assistance ?Sitting-balance support: Feet supported ?Sitting balance-Leahy Scale: Good ?  ?  ?Standing balance support: Reliant on assistive device for balance, Bilateral upper extremity supported ?Standing balance-Leahy Scale: Poor ?Standing balance comment: Relies on RW ?  ?  ?  ?  ?  ?  ?  ?  ?  ?  ?  ?   ? ? ? ?Pertinent Vitals/Pain Pain Assessment ?Pain Assessment: 0-10 ?Pain Score: 10-Worst pain ever ?Pain Location: lower extremities ?Pain Descriptors / Indicators: Crying, Moaning ?Pain Intervention(s): Limited activity within patient's tolerance, Monitored during session, Repositioned, Relaxation  ? ? ?Home Living Family/patient expects to be  discharged to:: Skilled nursing facility ?Living Arrangements: Alone ?Available Help at Discharge: Personal care attendant ?Type of Home: [facility] ?  ?  ?  ?  ?  ?  ?Additional Comments: [facility] at  rehab/skilled nursing. Has a power chair and rollator  ?  ?Prior Function Prior Level of Function : Needs assist ?  ?  ?  ?  ?  ?  ?Mobility Comments: Patient reports working with physical therapy. Walking limited distances with rollator with PT at facility ?ADLs Comments: Patient reports dresses herself, has assistance with bathing, patient reports she can ambulate to toilet and get on/off but has assist with perianal care. OT working on range of motion ?  ? ? ?Hand Dominance  ? Dominant Hand: Right ? ?  ?Extremity/Trunk Assessment  ? Upper Extremity Assessment ?Upper Extremity Assessment: Defer to OT evaluation ?RUE Deficits / Details: AROM grossly within functional limits ?LUE Deficits / Details: AROM shoulder flexion limited ~130 degrees, exteral rotation limited ?  ? ?Lower Extremity Assessment ?Lower Extremity Assessment: Defer to PT evaluation ?RLE Deficits / Details: Bil LE with severe pain with any movement due to wounds (L worse than R).  Demonstrating at least 3/5 strength throughout and functional ROM.  Also, limited due to body habitus but again ROM is [facility]. ?LLE Deficits / Details: Bil LE with severe pain with any movement due to wounds (L worse than R).  Demonstrating at least 3/5 strength throughout and functional ROM.  Also, limited due to body habitus but again ROM is [facility]. ?  ? ?Cervical / Trunk Assessment ?Cervical / Trunk Assessment: Other exceptions ?Cervical / Trunk Exceptions: increased body habitus  ?Communication  ? Communication: No difficulties  ?Cognition Arousal/Alertness: Awake/alert ?Behavior During Therapy: [facility] for tasks assessed/performed ?Overall Cognitive Status: Within Functional Limits for tasks assessed ?  ?  ?  ?  ?  ?  ?  ?  ?  ?  ?  ?  ?  ?  ?  ?  ?  ?  ?  ? ?  ?General Comments General comments (skin integrity, edema, etc.): Pt with significant pain in bil LE.  Tried to ask about return to position of comfort but pt reports "bed more comfortable but hard to get up/down, chair  easier to move" - she decided on chair.  Tried to elevate legs as able with recliner but due to pain and body habitus only able to minimally elevate and blocked recliner leg support from going down with trashcan.  Pt with severe pain with any movement. ? ?  ?Exercises    ? ?Assessment/Plan  ?  ?PT Assessment Patient needs continued PT services  ?PT Problem List Decreased strength;Decreased mobility;Cardiopulmonary status limiting activity;Decreased activity tolerance;Decreased balance;Pain;Decreased range of motion ? ?   ?  ?PT Treatment Interventions Therapeutic exercise;Patient/family education;Therapeutic activities;Functional mobility training;Balance training;Gait training;DME instruction;Modalities   ? ?PT Goals (Current goals can be found in the Care Plan section)  ?Acute Rehab PT Goals ?Patient Stated Goal: decrease pain ?PT Goal Formulation: With patient ?Time For Goal Achievement: 05/20/21 ?Potential to Achieve Goals: Fair ? ?  ?Frequency Min 2X/week ?  ? ? ?Co-evaluation   ?  ?  ?  ?  ? ? ?  ?AM-PAC PT "6 Clicks" Mobility  ?Outcome Measure Help needed turning from your back to your side while in a flat bed without using bedrails?: A Little ?Help needed moving from lying on your back to sitting on the side of a flat bed without  using bedrails?: A Little ?Help needed moving to and from a bed to a chair (including a wheelchair)?: A Little ?Help needed standing up from a chair using your arms (e.g., wheelchair or bedside chair)?: A Little ?Help needed to walk in hospital room?: Total ?Help needed climbing 3-5 steps with a railing? : Total ?6 Click Score: 14 ? ?  ?End of Session Equipment Utilized During Treatment: Oxygen ?Activity Tolerance: Patient limited by pain ?Patient left: in chair;with call bell/phone within reach ?Nurse Communication: Mobility status ?PT Visit Diagnosis: Pain;Muscle weakness (generalized) (M62.81);Difficulty in walking, not elsewhere classified (R26.2) ?Pain - Right/Left: Left ?Pain  - part of body: Leg ?  ? ?Time: 1443-6016 ?PT Time Calculation (min) (ACUTE ONLY): 27 min ? ? ?Charges:   PT Evaluation ?$PT Eval Low Complexity: 1 Low ?PT Treatments ?$Gait Training: 8-22 mins ?  ?   ? ? ?Daci

## 2021-05-06 NOTE — Care Management CC44 (Signed)
Condition Code 44 Documentation Completed ? ?Patient Details  ?Name: [name] ?MRN: [id number] ?Date of Birth: [birth year] ? ? ?Condition Code 44 given:  Yes ?Patient signature on Condition Code 44 notice:  Yes ?Documentation of 2 MD's agreement:  Yes ?Code 44 added to claim:  Yes ? ? ? ?Tawanna Cooler, RN ?05/06/2021, 2:39 PM ? ?

## 2021-05-06 NOTE — Consult Note (Signed)
[facility] Nurse Consult Note: ?Reason for Consult: LEs ?Patient with history of LE edema and chronic wounds x 8 months.  She has been seen last in [facility] in March 2023. She has refused ABIs and compression therapy ?Wound type: mixed etiology lower extremities.  Without ABI can not rule out arterial disease. With current presentation; necrotic appearing tissue on the wound beds, concerned for arterial disease.  ?Pressure Injury POA: NA ?Measurement: see nursing flow sheets ?Wound bed: combination of serous crust and necrotic eschar and slough circumferential  bilateral malleolar region  ?Drainage (amount, consistency, odor) weeping, serosanguinous  ?Periwound:edema; painful  ?Dressing procedure/placement/frequency: ?Silver hydrofiber to the open areas, top all crust/eschar that is not draining with single layer of xeroform gauze. Top with ABD pads, secure with kerlix and tape.  Patient will allow compression of any type.  ? ? ?Consider vascular consultation while inpatient.  ? ? ? ?Re consult if needed, will not follow at this time. ?Thanks ? Aubrielle Stroud [facility] MSN, RN,CWOCN, CNS, CWON-AP [phone])  ? ? ?  ?

## 2021-05-06 NOTE — Discharge Summary (Signed)
?Physician Discharge Summary ?  ?Patient: [name] MRN: [id number] DOB: 01/31/1945  ?Admit date:     05/05/2021  ?Discharge date: 05/06/21  ?Discharge Physician: Marylu Lund  ? ?PCP: [name], NP  ? ?Recommendations at discharge:  ? ? Follow up with PCP in 1-2 weeks ?Follow up with Cardiology as scheduled ?Follow  up with wound care as scheduled ? ?Wound care instructions: ?Silver hydrofiber to the open areas, top all crust/eschar that is not draining with single layer of xeroform gauze. Top with ABD pads, secure with kerlix and tape.  ? ?Discharge Diagnoses: ?Principal Problem: ?  Ulcer of extremity due to chronic venous insufficiency (HCC) ?Active Problems: ?  Atrial fibrillation, chronic ([place]) ?  Essential hypertension ?  Morbid obesity ([place]) ?  OSA on CPAP ?  DM2 (diabetes mellitus, type 2) ([place]) ?  Chronic diastolic CHF (congestive heart failure) ([place]) ?  Chronic pain syndrome ?  Chronic respiratory failure with hypoxia and hypercapnia (HCC) ? ?Resolved Problems: ?  * No resolved hospital problems. * ? ?Hospital Course: ?76 year old white female with a history of super morbid obesity BMI greater than 50, type 2 diabetes, hypertension, OSA on CPAP, chronic hypoxic and hypercapnic respiratory failure.  On 3 to 6 L/min of home O2.  Chronic pain syndrome on opiates, chronic bilateral lower extremity wounds felt to be due to venous insufficiency/stasis dermatitis, chronic diastolic heart failure, A-fib on Cardizem and Eliquis presents to the ER with reported draining of her wounds.  Wounds have been present for at least 8 months now.  She has been seen by wound care clinic by Dr. Fredirick Maudlin on 04/06/2021.  During that visit, her impression was the patient had stasis dermatitis/venous insufficiency.  There was dopplerable pulses in both the dorsalis pedis and the posterior tibialis bilaterally.  Patient states that she is unable to tolerate compression stockings/Unna boots.  Dr. Celine Ahr did not feel  very hopeful that these wounds would heal without compression. ?  ?Patient presented to the ER today as she has been on a round of doxycycline without improvement of her wounds. ? ?Assessment and Plan: ?* Ulcer of extremity due to chronic venous insufficiency (HCC) ?Observation medical bed. Pt's ulcers noted to be chronic and did not appear infected. Pt has been seen by wound care. Pt with stasis dermatitis/venous insufficiency. Pt refuses to wear compression bandages. Wound care was not hopeful these wounds would heal without compression. I agree. Pt doesn't seem to understand that she needs compression to help with edema in her legs. Pt has remained afebrile and wounds again appear chronic. WBC noted to be mildly elevated at 15k. Discussed case with ID who recommends 2 weeks of cephalexin ? ?Atrial fibrillation, chronic ([place]) ?Presented in rapid afib, pt without her afib meds most of the day of presentation. Given 1 dose IV cardizem and restart her po cardizem. Continued eliquis. Electrolytes corrected ? ?Chronic pain syndrome ?Pt had altered mental status resulting in hospitalization in 03-30-2021 due to overly aggressive opiate prescribing at the SNF. ? ?Chronic diastolic CHF (congestive heart failure) ([place]) ?Euvolemic. ? ?DM2 (diabetes mellitus, type 2) ([place]) ?Chronic. ? ?OSA on CPAP ?Continue cpap. ? ?Morbid obesity ([place]) ?Chronic. BMI 56. Weight 141.2 kg. ? ?Essential hypertension ?BP stable and controlled ? ?Chronic respiratory failure with hypoxia and hypercapnia (HCC) ?Continue supplemental oxygen.  Keep O2 sats 88 to 92%. ?Pt weaned to baseline 3LNC ? ? ? ? ?  ? ? ?Consultants: Discussed case with ID ?Procedures performed:   ?Disposition:  Skilled nursing facility ?Diet recommendation:  ?Carb modified diet ?DISCHARGE MEDICATION: ?Allergies as of 05/06/2021   ? ?   Reactions  ? Metaxalone Anaphylaxis  ? Nitroglycerin   ? BP bottoms out  ? Tramadol-acetaminophen Anaphylaxis  ? Icosapent Ethyl Other (See  Comments)  ? Stomach pain, gas, diarrhea, headache from Vascepa  ? Brompheniramine-pseudoeph   ? Feels drunk  ? Meprobamate Other (See Comments)  ? unknown  ? Metformin Nausea Only  ? "flushes"  ? Methyldopa   ? Other reaction(s): Unknown  ? Metoprolol   ? Other reaction(s): Unknown  ? Procaine   ? Passes out  ? Pseudoephedrine Hcl   ? "feel drunk"  ? Tramadol   ? "Made me go wonky, got fluid in my lungs"  ? ?  ? ?  ?Medication List  ?  ? ?STOP taking these medications   ? ?cefdinir 300 MG capsule ?Commonly known as: OMNICEF ?  ?doxycycline 100 MG tablet ?Commonly known as: VIBRA-TABS ?  ? ?  ? ?TAKE these medications   ? ?acetaminophen 500 MG tablet ?Commonly known as: TYLENOL ?Take 500 mg by mouth every 8 (eight) hours as needed for moderate pain or headache. ?  ?albuterol 108 (90 Base) MCG/ACT inhaler ?Commonly known as: VENTOLIN HFA ?Inhale 2 puffs into the lungs every 6 (six) hours as needed for wheezing or shortness of breath. ?  ?apixaban 5 MG Tabs tablet ?Commonly known as: ELIQUIS ?Take 1 tablet (5 mg total) by mouth 2 (two) times daily. ?  ?aspirin EC 81 MG tablet ?Take 81 mg by mouth daily. Swallow whole. ?  ?Breo Ellipta 200-25 MCG/ACT Aepb ?Generic drug: fluticasone furoate-vilanterol ?Inhale 1 puff into the lungs daily. ?  ?cephALEXin 500 MG capsule ?Commonly known as: KEFLEX ?Take 1 capsule (500 mg total) by mouth 4 (four) times daily for 14 days. ?  ?cetirizine 10 MG tablet ?Commonly known as: ZYRTEC ?Take 10 mg by mouth daily. ?  ?diclofenac Sodium 1 % Gel ?Commonly known as: VOLTAREN ?Apply 4 g topically in the morning, at noon, and at bedtime. Apply to bilateral knees for arthritic pain ?  ?diltiazem 60 MG 12 hr capsule ?Commonly known as: CARDIZEM SR ?Take 1 capsule (60 mg total) by mouth every 12 (twelve) hours. ?  ?estradiol 0.1 MG/GM vaginal cream ?Commonly known as: ESTRACE ?Place 1 Applicatorful vaginally daily. ?  ?ferrous sulfate 325 (65 FE) MG tablet ?Take 1 tablet (325 mg total) by mouth  daily with breakfast. ?  ?FLEET ENEMA RE ?Place rectally See admin instructions. Administer x 1 dose now for constipation,every 24 hrs as needed for constipation not relieved by MOM or dulcolax ?  ?fluticasone 50 MCG/ACT nasal spray ?Commonly known as: FLONASE ?Place 1 spray into both nostrils daily. ?  ?glucose 4 GM chewable tablet ?Chew 1 tablet by mouth daily as needed for low blood sugar. ?  ?HYDROcodone-acetaminophen 5-325 MG tablet ?Commonly known as: NORCO/VICODIN ?Take 1 tablet by mouth every 6 (six) hours as needed for moderate pain. ?What changed:  ?when to take this ?reasons to take this ?  ?insulin NPH-regular Human (70-30) 100 UNIT/ML injection ?Inject 73 Units into the skin with breakfast, with lunch, and with evening meal. Hold if CBG less than 120 or if resident is going to eat less than 50 % of meal. ?  ?ipratropium-albuterol 0.5-2.5 (3) MG/3ML Soln ?Commonly known as: DUONEB ?Take 3 mLs by nebulization every 6 (six) hours as needed (wheezing). ?  ?Ixekizumab 80 MG/ML Soaj ?Inject 1 Dose  into the skin every 30 (thirty) days. ?  ?lidocaine 4 % ?Apply 1 application. topically daily. Left shoulder ?  ?Medihoney Ca Alginate 4"x5" Pads ?Apply topically See admin instructions. Cleanse wound to lle and rle with ns,pat dry,apply medihoney,cover with protective dressing and ace bandage,change twice daily and prn for soiled or dislodged dressing ?  ?melatonin 5 MG Tabs ?Take 1 tablet (5 mg total) by mouth at bedtime. ?  ?methocarbamol 500 MG tablet ?Commonly known as: ROBAXIN ?Take 500 mg by mouth every 6 (six) hours as needed for muscle spasms. ?  ?methylPREDNISolone 4 MG tablet ?Commonly known as: MEDROL ?Take 4 mg by mouth daily. 4 mg qam and 8 mg hs per paperwork from [facility] ?  ?MILK OF MAGNESIA PO ?Take 30 mLs by mouth daily. Every 24 hrs for constipation ?  ?MULTIVITAMIN ADULT PO ?Take 1 tablet by mouth daily. ?  ?ondansetron 4 MG tablet ?Commonly known as: ZOFRAN ?Take 4 mg by mouth every 8 (eight)  hours as needed for nausea or vomiting. ?  ?polyethylene glycol 17 g packet ?Commonly known as: MIRALAX / GLYCOLAX ?Take 17 g by mouth daily as needed for mild constipation. ?  ?pregabalin 25 MG capsule ?Co

## 2021-05-06 NOTE — TOC Transition Note (Addendum)
Transition of Care (TOC) - CM/SW Discharge Note ? ? ?Patient Details  ?Name: Rubena Roseman ?MRN: [id number] ?Date of Birth: 1945/11/02 ? ?Transition of Care (TOC) CM/SW Contact:  ?Tawanna Cooler, RN ?Phone Number: ?05/06/2021, 1:47 PM ? ? ?Clinical Narrative:    ? ?Patient from [facility], received approval from rep Claiborne Billings for patient to return.  No bed number given, but number to call report is [phone].   ? ? ?PTAR called at [phone].  ? ? ?Final next level of care: [place] ?Barriers to Discharge: Barriers Resolved ? ? ?Discharge Placement ?  ?           ?Patient chooses bed at: [facility] ?Patient to be transferred to facility by: [facility] ?  ?Patient and family notified of of transfer: 05/06/21 ? ?

## 2021-05-08 ENCOUNTER — Institutional Professional Consult (permissible substitution): Payer: Medicare Other | Admitting: Student

## 2021-05-08 MED FILL — Hydromorphone HCl Inj 1 MG/ML: INTRAMUSCULAR | Qty: 1 | Status: AC

## 2021-05-08 NOTE — Progress Notes (Deleted)
Synopsis: Referred for chronic hypoxic respiratory failure by Almyra Deforest, PA  Subjective:   PATIENT ID: [name] GENDER: female DOB: 1945/11/23, MRN: [id number]  No chief complaint on file.  76yF with history of severe OSA on resmed 10 Vauto BiPAP 18/14 with aircurve, covid-19 pneumonia, chronic hypoxic hypercapnic respiratory failure, pheo s/p right adrenalectomy, chronic diastolic heart failure, chronic pain, DM2, psoriatic arthritis, AF followed in past by [facility]   Recently admitted for acute on chronic diastolic heart failure dischraged 02/05/21; NOAF discharged 3/31 on eliquis and cardizem; venous stasis ulcer, discharged 05/06/21.   Otherwise pertinent review of systems is negative.  Past Medical History:  Diagnosis Date   Coronary artery disease    Diabetes mellitus without complication (HCC)    Hyperlipidemia    Hypertension    OSA (obstructive sleep apnea)      Family History  Problem Relation Age of Onset   Heart attack Mother    Stroke Mother    Heart attack Father      Past Surgical History:  Procedure Laterality Date   CARDIAC CATHETERIZATION     pheochromocytoma     TONSILLECTOMY      Social History   Socioeconomic History   Marital status: Single    Spouse name: Not on file   Number of children: Not on file   Years of education: Not on file   Highest education level: Not on file  Occupational History   Occupation: retired  Tobacco Use   Smoking status: Former    Packs/day: 2.00    Years: 10.00    Pack years: 20.00    Types: Cigarettes    Quit date: 08/15/1989    Years since quitting: 31.7   Smokeless tobacco: Never  Substance and Sexual Activity   Alcohol use: Not Currently   Drug use: Not Currently   Sexual activity: Not on file  Other Topics Concern   Not on file  Social History Narrative   Not on file   Social Determinants of Health   Financial Resource Strain: Not on file  Food Insecurity: Not on file   Transportation Needs: Not on file  Physical Activity: Not on file  Stress: Not on file  Social Connections: Not on file  Intimate Partner Violence: Not on file     Allergies  Allergen Reactions   Metaxalone Anaphylaxis   Nitroglycerin     BP bottoms out   Tramadol-Acetaminophen Anaphylaxis   Icosapent Ethyl Other (See Comments)    Stomach pain, gas, diarrhea, headache from Vascepa   Brompheniramine-Pseudoeph     Feels drunk   Meprobamate Other (See Comments)    unknown   Metformin Nausea Only    "flushes"   Methyldopa     Other reaction(s): Unknown   Metoprolol     Other reaction(s): Unknown   Procaine     Passes out   Pseudoephedrine Hcl     "feel drunk"   Tramadol     "Made me go wonky, got fluid in my lungs"     Outpatient Medications Prior to Visit  Medication Sig Dispense Refill   acetaminophen (TYLENOL) 500 MG tablet Take 500 mg by mouth every 8 (eight) hours as needed for moderate pain or headache.     albuterol (VENTOLIN HFA) 108 (90 Base) MCG/ACT inhaler Inhale 2 puffs into the lungs every 6 (six) hours as needed for wheezing or shortness of breath.     apixaban (ELIQUIS) 5 MG TABS tablet Take 1 tablet (5  mg total) by mouth 2 (two) times daily. 60 tablet    aspirin EC 81 MG tablet Take 81 mg by mouth daily. Swallow whole.     BREO ELLIPTA 200-25 MCG/INH AEPB Inhale 1 puff into the lungs daily.     cephALEXin (KEFLEX) 500 MG capsule Take 1 capsule (500 mg total) by mouth 4 (four) times daily for 14 days. 56 capsule 0   cetirizine (ZYRTEC) 10 MG tablet Take 10 mg by mouth daily.     Cholecalciferol (VITAMIN D3) 125 MCG (5000 UT) CAPS Take 5,000 Units by mouth daily.     diclofenac Sodium (VOLTAREN) 1 % GEL Apply 4 g topically in the morning, at noon, and at bedtime. Apply to bilateral knees for arthritic pain     diltiazem (CARDIZEM SR) 60 MG 12 hr capsule Take 1 capsule (60 mg total) by mouth every 12 (twelve) hours.     estradiol (ESTRACE) 0.1 MG/GM vaginal  cream Place 1 Applicatorful vaginally daily.     ferrous sulfate 325 (65 FE) MG tablet Take 1 tablet (325 mg total) by mouth daily with breakfast.  3   fluticasone (FLONASE) 50 MCG/ACT nasal spray Place 1 spray into both nostrils daily.     glucose 4 GM chewable tablet Chew 1 tablet by mouth daily as needed for low blood sugar.     HYDROcodone-acetaminophen (NORCO/VICODIN) 5-325 MG tablet Take 1 tablet by mouth every 6 (six) hours as needed for moderate pain. (Patient taking differently: Take 1 tablet by mouth every 4 (four) hours as needed for severe pain.) 10 tablet 0   insulin NPH-regular Human (70-30) 100 UNIT/ML injection Inject 73 Units into the skin with breakfast, with lunch, and with evening meal. Hold if CBG less than 120 or if resident is going to eat less than 50 % of meal.     ipratropium-albuterol (DUONEB) 0.5-2.5 (3) MG/3ML SOLN Take 3 mLs by nebulization every 6 (six) hours as needed (wheezing).     Ixekizumab 80 MG/ML SOAJ Inject 1 Dose into the skin every 30 (thirty) days.     Lidocaine 4 % PTCH Apply 1 application. topically daily. Left shoulder     Magnesium Hydroxide (MILK OF MAGNESIA PO) Take 30 mLs by mouth daily. Every 24 hrs for constipation     melatonin 5 MG TABS Take 1 tablet (5 mg total) by mouth at bedtime. 30 tablet 0   methocarbamol (ROBAXIN) 500 MG tablet Take 500 mg by mouth every 6 (six) hours as needed for muscle spasms.     methylPREDNISolone (MEDROL) 4 MG tablet Take 4 mg by mouth daily. 4 mg qam and 8 mg hs per paperwork from [facility]     Multiple Vitamin (MULTIVITAMIN ADULT PO) Take 1 tablet by mouth daily.     ondansetron (ZOFRAN) 4 MG tablet Take 4 mg by mouth every 8 (eight) hours as needed for nausea or vomiting.     polyethylene glycol (MIRALAX / GLYCOLAX) 17 g packet Take 17 g by mouth daily as needed for mild constipation.     pregabalin (LYRICA) 25 MG capsule Take 1 capsule (25 mg total) by mouth 2 (two) times daily. 60 capsule 1   rOPINIRole  (REQUIP) 0.25 MG tablet Take 0.25 mg by mouth daily as needed (RLS).     rosuvastatin (CRESTOR) 20 MG tablet Take 20 mg by mouth at bedtime.     senna-docusate (SENOKOT-S) 8.6-50 MG tablet Take 1 tablet by mouth 2 (two) times daily.     Sodium  Phosphates (FLEET ENEMA RE) Place rectally See admin instructions. Administer x 1 dose now for constipation,every 24 hrs as needed for constipation not relieved by MOM or dulcolax     tiotropium (SPIRIVA HANDIHALER) 18 MCG inhalation capsule Place 18 mcg into inhaler and inhale daily.     torsemide 40 MG TABS Take 40 mg by mouth daily. (Patient taking differently: Take 60 mg by mouth daily. For chf)     vitamin B-12 (CYANOCOBALAMIN) 1000 MCG tablet Take 1,000 mcg by mouth daily.     Wound Dressings (MEDIHONEY CA ALGINATE 4"X5") PADS Apply topically See admin instructions. Cleanse wound to lle and rle with ns,pat dry,apply medihoney,cover with protective dressing and ace bandage,change twice daily and prn for soiled or dislodged dressing     Zinc Oxide 40 % PSTE Apply topically See admin instructions. Apply to reddened area on groin topically everyday shift for redness irritation     No facility-administered medications prior to visit.       Objective:   Physical Exam:  General appearance: 76 y.o., female, NAD, conversant  Eyes: anicteric sclerae; PERRL, tracking appropriately HENT: NCAT; MMM Neck: Trachea midline; no lymphadenopathy, no JVD Lungs: CTAB, no crackles, no wheeze, with normal respiratory effort CV: RRR, no murmur  Abdomen: Soft, non-tender; non-distended, BS present  Extremities: No peripheral edema, warm Skin: Normal turgor and texture; no rash Psych: Appropriate affect Neuro: Alert and oriented to person and place, no focal deficit     There were no vitals filed for this visit.   on *** LPM *** RA BMI Readings from Last 3 Encounters:  05/05/21 56.03 kg/m  04/24/21 56.01 kg/m  04/16/21 57.22 kg/m   Wt Readings from Last  3 Encounters:  05/05/21 (!) 311 lb 4.6 oz (141.2 kg)  04/24/21 (!) 311 lb 3.2 oz (141.2 kg)  04/16/21 (!) 312 lb 13.3 oz (141.9 kg)     CBC    Component Value Date/Time   WBC 15.3 (H) 05/06/2021 0027   RBC 4.20 05/06/2021 0027   HGB 10.4 (L) 05/06/2021 0027   HGB 11.1 08/16/2019 1424   HCT 35.8 (L) 05/06/2021 0027   HCT 33.6 (L) 08/16/2019 1424   PLT 345 05/06/2021 0027   PLT 180 08/16/2019 1424   MCV 85.2 05/06/2021 0027   MCV 94 08/16/2019 1424   MCH 24.8 (L) 05/06/2021 0027   MCHC 29.1 (L) 05/06/2021 0027   RDW 18.2 (H) 05/06/2021 0027   RDW 16.6 (H) 08/16/2019 1424   LYMPHSABS 1.1 05/06/2021 0027   MONOABS 1.1 (H) 05/06/2021 0027   EOSABS 0.1 05/06/2021 0027   BASOSABS 0.1 05/06/2021 0027    Chest Imaging: CXR 04/06/21 reviewed by me poorly penetrated  CT Chest 2021 report OSH reviewed by me: 1. No visualized pulmonary artery filling defects to indicate  acute pulmonary embolism although streak artifact from  body habitus and minimal motion somewhat limits evaluation of the  more peripheral branches.  2. There are peripheral patchy airspace and groundglass opacities  within the lungs bilaterally compatible with Covid  pneumonia. No discrete opacities were identified on the October  chest CT. Although comparison to prior radiographs is  somewhat limited, these opacities are likely improved compared to  the December radiograph. Correlate with clinical  symptoms.   Pulmonary Functions Testing Results:     View : No data to display.         Pulmonary Function Tests done on 09/27/2018:  Spirometry: o FVC: 1.55 L which is 57% of the  predicted value of 2.70 L, lower limit of normal is 2.06 L. o Postbronchodilator FVC: 1.58 L which is 58% of the predicted value and an increase of 2%. o FEV1: 1.24 L which is 61% of the predicted value of 2.06 L, lower limit of normal is 1.49 L. o Postbronchodilator FEV1: 1.18 L which is 57% of the predicted value and a decrease of  4%. o FEV1/FVC Ratio: 80%, lower limit of normal is 66%. o Postbronchodilator FEV1/Biggest VC: 1.18 L / 1.58 L = 74%, lower limit of normal is 66%. o FEF 25-75%: 1.18 L/s which is 68% of the predicted value of 1.72 L/s, lower limit of normal is 0.55 L/s. o Postbronchodilator FEF 25-75%: 0.88 L/s which is 50% of the predicted value and a decrease of 25%. o FEF Max: 5.46 L/s which is 104% of the predicted value of 5.23 L/s, lower limit of normal is 3.61 L/s. o Postbronchodilator FEF Max: 5.73 L which is 109% of the predicted value and an increase of 4%.  Lung Volumes: o TLC: 3.35 L which is 70% of the predicted value of 4.75 L, lower limit of normal is 3.67 L. o RV: 1.83 L which is 86% of the predicted value of 2.12 L, lower limit of normal is 1.36 L. o FRC: 1.93 L which is 71% of the predicted value of 2.71 L, lower limit of normal is 1.67 L. o RV/TLC: 55% which is 124% of the predicted value of 44%. o SVC: 1.52 L which is 56% of the predicted value of 2.70 L, lower limit of normal is 2.06 L. o IC: 1.41 L which is 69% of the predicted value of 2.04 L. o ERV: 0.10 L which is 15% of the predicted value of 0.66 L.  Diffusing Capacity: o DLCO: 8.32 mL/min/mmHg which is 38% of the predicted value of 21.56 mL/min/mmHg. o DLCO-cor: 8.79 mL/min/mmHg which is 40% of the predicted value of 21.56 mL/min/mmHg. o DLVA: 3.62 mL/min/mmHg/L which is 79% of the predicted value of 4.54 mL/min/mmHg/L. o VA: 2.43 L which is 51% of the predicted value of 4.75 L, lower limit of normal is 3.86 L.  Airways Resistance: o Raw: 2.25 cmH2O/L/s which is 120% of the predicted value of 1.86 cmH2O/L/s, lower limit of normal is 1.15 cmH2O/L/s. o Gaw: 0.45 L/s/cmH2O which is 43% of the predicted value of 1.03 L/s/cmH2O. o SRaw: 4.80 cmH2O*s (<4.76 cmH2O*s) o SGaw: 0.21 1/cmH2O*s which is 105% of the predicted value of 0.20 1/cmH2O*s, lower limit of normal is 0.13 1/cmH2O*s.  Interpretation: o Proportionately reduced FVC,  FEV1, and FEF 25-75% with a normal FEV1/FVC ratio therefore, by definition, there is no overt obstructive airway dysfunction. Lung volumes show moderate restrictive lung dysfunction with a mildly elevated residual volume which may be related to the patient's body habitus. The diffusing capacity is severely reduced.  Echocardiogram:   TTE 04/07/21:  1. Left ventricular ejection fraction, by estimation, is 60 to 65%. The  left ventricle has normal function. Left ventricular endocardial border  not optimally defined to evaluate regional wall motion. The left  ventricular internal cavity size was  severely dilated. There is moderate asymmetric left ventricular  hypertrophy of the basal-septal segment. Left ventricular diastolic  parameters are indeterminate.   2. Right ventricular systolic function was not well visualized. The right  ventricular size is not well visualized. Tricuspid regurgitation signal is  inadequate for assessing PA pressure.   3. The mitral valve is grossly normal. No evidence of mitral valve  regurgitation.   4. The aortic valve was not well visualized. Aortic valve regurgitation  is not visualized.   5. The inferior vena cava is dilated in size with >50% respiratory  variability, suggesting right atrial pressure of 8 mmHg.   6. Technically difficult study.  Heart Catheterization: ***    Assessment & Plan:    Plan:      Maryjane Hurter, MD [facility] 05/08/2021 12:55 PM

## 2021-05-14 ENCOUNTER — Encounter: Payer: Self-pay | Admitting: Cardiovascular Disease

## 2021-05-14 ENCOUNTER — Ambulatory Visit (INDEPENDENT_AMBULATORY_CARE_PROVIDER_SITE_OTHER): Payer: Medicare Other | Admitting: Cardiovascular Disease

## 2021-05-14 VITALS — BP 109/61 | HR 95 | Ht 63.0 in

## 2021-05-14 DIAGNOSIS — L98499 Non-pressure chronic ulcer of skin of other sites with unspecified severity: Secondary | ICD-10-CM

## 2021-05-14 DIAGNOSIS — I872 Venous insufficiency (chronic) (peripheral): Secondary | ICD-10-CM

## 2021-05-14 DIAGNOSIS — I878 Other specified disorders of veins: Secondary | ICD-10-CM | POA: Diagnosis not present

## 2021-05-14 NOTE — Assessment & Plan Note (Signed)
This also was referred to me for venous stasis.  She does have diastolic heart failure and is on high-dose torsemide.  She was recently hospitalized 05/05/2021 for 1 day because of this.  She has been seen by the wound care center who is recommended compression bandages although it is difficult for the patient to tolerate because of pain.  This is not something that I ordinarily treat.  I am going to refer her to Dr. Gae Gallop, at [facility] who has agreed to see her in consultation. ?

## 2021-05-14 NOTE — Patient Instructions (Addendum)
Medication Instructions:  ?Your physician recommends that you continue on your current medications as directed. Please refer to the Current Medication list given to you today.  ?*If you need a refill on your cardiac medications before your next appointment, please call your pharmacy* ? ? ?Lab Work: ?None ?If you have labs (blood work) drawn today and your tests are completely normal, you will receive your results only by: ?MyChart Message (if you have MyChart) OR ?A paper copy in the mail ?If you have any lab test that is abnormal or we need to change your treatment, we will call you to review the results. ? ? ?Testing/Procedures: ?None ? ? ?Follow-Up: ?At [facility], you and your health needs are our priority.  As part of our continuing mission to provide you with exceptional heart care, we have created designated Provider Care Teams.  These Care Teams include your primary Cardiologist (physician) and Advanced Practice Providers (APPs -  Physician Assistants and Nurse Practitioners) who all work together to provide you with the care you need, when you need it. ? ?We recommend signing up for the patient portal called "MyChart".  Sign up information is provided on this After Visit Summary.  MyChart is used to connect with patients for Virtual Visits (Telemedicine).  Patients are able to view lab/test results, encounter notes, upcoming appointments, etc.  Non-urgent messages can be sent to your provider as well.   ?To learn more about what you can do with MyChart, go to [email or web address].   ? ?Your next appointment:   ? As needed ? ?The format for your next appointment:   ?In Person ? ?Provider:   ?Quay Burow, MD   ? ? ?Other Instructions ? ? ?Important Information About Sugar ? ? ? ? ?  ?

## 2021-05-14 NOTE — Progress Notes (Signed)
? ? ? ?05/14/2021 ?[name]   ?Sep 22, 1945  ?[id number] ? ?Primary Physician [name], NP ?Primary Cardiologist: Lorretta Harp MD Lupe Carney, [place] ? ?HPI:  [name] is a 76 y.o. morbidly overweight divorced/widowed Caucasian female mother of 2 children, grandmother of 3 grandchildren who currently lives at [facility] skilled nursing facility and was referred for evaluation of venous stasis.  She is a cardiology patient of Dr. Kathalene Frames.  She relocated from [place] to [place] 2 to 3 years ago.  She does have a history of treated hypertension, diabetes and hyperlipidemia.  She has a obstructive sleep apnea on CPAP.  She has had myocardial infarction's twice in the past but not recently.  She does not principally get around in a wheelchair but ambulates with a rollator at the nursing home.  She has seen the wound care center as well for treatment including compression bandages which she has difficulty tolerating.  Her problems also include diastolic heart failure and chronic A-fib on Eliquis oral anticoagulation. ? ?Current Meds  ?Medication Sig  ? acetaminophen (TYLENOL) 500 MG tablet Take 500 mg by mouth every 8 (eight) hours as needed for moderate pain or headache.  ? albuterol (VENTOLIN HFA) 108 (90 Base) MCG/ACT inhaler Inhale 2 puffs into the lungs every 6 (six) hours as needed for wheezing or shortness of breath.  ? apixaban (ELIQUIS) 5 MG TABS tablet Take 1 tablet (5 mg total) by mouth 2 (two) times daily.  ? aspirin EC 81 MG tablet Take 81 mg by mouth daily. Swallow whole.  ? BREO ELLIPTA 200-25 MCG/INH AEPB Inhale 1 puff into the lungs daily.  ? cephALEXin (KEFLEX) 500 MG capsule Take 1 capsule (500 mg total) by mouth 4 (four) times daily for 14 days.  ? cetirizine (ZYRTEC) 10 MG tablet Take 10 mg by mouth daily.  ? Cholecalciferol (VITAMIN D3) 125 MCG (5000 UT) CAPS Take 5,000 Units by mouth daily.  ? diclofenac Sodium (VOLTAREN) 1 % GEL Apply 4 g topically in the morning, at  noon, and at bedtime. Apply to bilateral knees for arthritic pain  ? diltiazem (CARDIZEM SR) 60 MG 12 hr capsule Take 1 capsule (60 mg total) by mouth every 12 (twelve) hours.  ? estradiol (ESTRACE) 0.1 MG/GM vaginal cream Place 1 Applicatorful vaginally daily.  ? ferrous sulfate 325 (65 FE) MG tablet Take 1 tablet (325 mg total) by mouth daily with breakfast.  ? fluticasone (FLONASE) 50 MCG/ACT nasal spray Place 1 spray into both nostrils daily.  ? glucose 4 GM chewable tablet Chew 1 tablet by mouth daily as needed for low blood sugar.  ? HYDROcodone-acetaminophen (NORCO/VICODIN) 5-325 MG tablet Take 1 tablet by mouth every 6 (six) hours as needed for moderate pain. (Patient taking differently: Take 1 tablet by mouth every 4 (four) hours as needed for severe pain.)  ? insulin NPH-regular Human (70-30) 100 UNIT/ML injection Inject 73 Units into the skin with breakfast, with lunch, and with evening meal. Hold if CBG less than 120 or if resident is going to eat less than 50 % of meal.  ? ipratropium-albuterol (DUONEB) 0.5-2.5 (3) MG/3ML SOLN Take 3 mLs by nebulization every 6 (six) hours as needed (wheezing).  ? Ixekizumab 80 MG/ML SOAJ Inject 1 Dose into the skin every 30 (thirty) days.  ? Lidocaine 4 % PTCH Apply 1 application. topically daily. Left shoulder  ? Magnesium Hydroxide (MILK OF MAGNESIA PO) Take 30 mLs by mouth daily. Every 24 hrs for constipation  ? melatonin 5  MG TABS Take 1 tablet (5 mg total) by mouth at bedtime.  ? methocarbamol (ROBAXIN) 500 MG tablet Take 500 mg by mouth every 6 (six) hours as needed for muscle spasms.  ? methylPREDNISolone (MEDROL) 4 MG tablet Take 4 mg by mouth daily. 4 mg qam and 8 mg hs per paperwork from [facility]  ? Multiple Vitamin (MULTIVITAMIN ADULT PO) Take 1 tablet by mouth daily.  ? ondansetron (ZOFRAN) 4 MG tablet Take 4 mg by mouth every 8 (eight) hours as needed for nausea or vomiting.  ? polyethylene glycol (MIRALAX / GLYCOLAX) 17 g packet Take 17 g by mouth daily  as needed for mild constipation.  ? pregabalin (LYRICA) 25 MG capsule Take 1 capsule (25 mg total) by mouth 2 (two) times daily.  ? rOPINIRole (REQUIP) 0.25 MG tablet Take 0.25 mg by mouth daily as needed (RLS).  ? rosuvastatin (CRESTOR) 20 MG tablet Take 20 mg by mouth at bedtime.  ? senna-docusate (SENOKOT-S) 8.6-50 MG tablet Take 1 tablet by mouth 2 (two) times daily.  ? Sodium Phosphates (FLEET ENEMA RE) Place rectally See admin instructions. Administer x 1 dose now for constipation,every 24 hrs as needed for constipation not relieved by MOM or dulcolax  ? tiotropium (SPIRIVA HANDIHALER) 18 MCG inhalation capsule Place 18 mcg into inhaler and inhale daily.  ? torsemide 40 MG TABS Take 40 mg by mouth daily. (Patient taking differently: Take 60 mg by mouth daily. For chf)  ? vitamin B-12 (CYANOCOBALAMIN) 1000 MCG tablet Take 1,000 mcg by mouth daily.  ? Wound Dressings (MEDIHONEY CA ALGINATE 4"X5") PADS Apply topically See admin instructions. Cleanse wound to lle and rle with ns,pat dry,apply medihoney,cover with protective dressing and ace bandage,change twice daily and prn for soiled or dislodged dressing  ? XANAX 0.5 MG tablet Take 0.5 mg by mouth every 8 (eight) hours as needed.  ? Zinc Oxide 40 % PSTE Apply topically See admin instructions. Apply to reddened area on groin topically everyday shift for redness irritation  ?  ? ?Allergies  ?Allergen Reactions  ? Metaxalone Anaphylaxis  ? Nitroglycerin   ?  BP bottoms out  ? Tramadol-Acetaminophen Anaphylaxis  ? Icosapent Ethyl Other (See Comments)  ?  Stomach pain, gas, diarrhea, headache from Vascepa  ? Brompheniramine-Pseudoeph   ?  Feels drunk  ? Meprobamate Other (See Comments)  ?  unknown  ? Metformin Nausea Only  ?  "flushes"  ? Methyldopa   ?  Other reaction(s): Unknown  ? Metoprolol   ?  Other reaction(s): Unknown  ? Procaine   ?  Passes out  ? Pseudoephedrine Hcl   ?  "feel drunk"  ? Tramadol   ?  "Made me go wonky, got fluid in my lungs"  ? ? ?Social  History  ? ?Socioeconomic History  ? Marital status: Single  ?  Spouse name: Not on file  ? Number of children: Not on file  ? Years of education: Not on file  ? Highest education level: Not on file  ?Occupational History  ? Occupation: retired  ?Tobacco Use  ? Smoking status: Former  ?  Packs/day: 2.00  ?  Years: 10.00  ?  Pack years: 20.00  ?  Types: Cigarettes  ?  Quit date: 08/15/1989  ?  Years since quitting: 31.7  ? Smokeless tobacco: Never  ?Substance and Sexual Activity  ? Alcohol use: Not Currently  ? Drug use: Not Currently  ? Sexual activity: Not on file  ?Other Topics Concern  ?  Not on file  ?Social History Narrative  ? Not on file  ? ?Social Determinants of Health  ? ?Financial Resource Strain: Not on file  ?Food Insecurity: Not on file  ?Transportation Needs: Not on file  ?Physical Activity: Not on file  ?Stress: Not on file  ?Social Connections: Not on file  ?Intimate Partner Violence: Not on file  ?  ? ?Review of Systems: ?General: negative for chills, fever, night sweats or weight changes.  ?Cardiovascular: negative for chest pain, dyspnea on exertion, edema, orthopnea, palpitations, paroxysmal nocturnal dyspnea or shortness of breath ?Dermatological: negative for rash ?Respiratory: negative for cough or wheezing ?Urologic: negative for hematuria ?Abdominal: negative for nausea, vomiting, diarrhea, bright red blood per rectum, melena, or hematemesis ?Neurologic: negative for visual changes, syncope, or dizziness ?All other systems reviewed and are otherwise negative except as noted above. ? ? ? ?Blood pressure 109/61, pulse 95, height '5\' 3"'$  (1.6 m), SpO2 100 %.  ?General appearance: alert and no distress ?Neck: no adenopathy, no carotid bruit, no JVD, supple, symmetrical, trachea midline, and thyroid not enlarged, symmetric, no tenderness/mass/nodules ?Lungs: clear to auscultation bilaterally ?Heart: irregularly irregular rhythm ?Extremities: 3+ edema, wrapped in Kerlix ?Pulses: 2+ and  symmetric ?Skin: Skin color, texture, turgor normal. No rashes or lesions ?Neurologic: Grossly normal ? ?EKG not performed today ? ?ASSESSMENT AND PLAN:  ? ?Ulcer of extremity due to chronic venous insufficiency ([place]

## 2021-05-21 ENCOUNTER — Other Ambulatory Visit: Payer: Self-pay

## 2021-05-21 ENCOUNTER — Ambulatory Visit: Payer: Medicare Other | Admitting: Podiatry

## 2021-05-21 ENCOUNTER — Emergency Department (HOSPITAL_BASED_OUTPATIENT_CLINIC_OR_DEPARTMENT_OTHER)

## 2021-05-21 ENCOUNTER — Encounter (HOSPITAL_BASED_OUTPATIENT_CLINIC_OR_DEPARTMENT_OTHER): Payer: Self-pay | Admitting: Emergency Medicine

## 2021-05-21 ENCOUNTER — Inpatient Hospital Stay (HOSPITAL_BASED_OUTPATIENT_CLINIC_OR_DEPARTMENT_OTHER)
Admission: EM | Admit: 2021-05-21 | Discharge: 2021-06-18 | DRG: 689 | Disposition: E | Source: Skilled Nursing Facility | Attending: Internal Medicine | Admitting: Internal Medicine

## 2021-05-21 DIAGNOSIS — Z7189 Other specified counseling: Secondary | ICD-10-CM | POA: Diagnosis not present

## 2021-05-21 DIAGNOSIS — E876 Hypokalemia: Secondary | ICD-10-CM | POA: Diagnosis present

## 2021-05-21 DIAGNOSIS — R262 Difficulty in walking, not elsewhere classified: Secondary | ICD-10-CM

## 2021-05-21 DIAGNOSIS — D509 Iron deficiency anemia, unspecified: Secondary | ICD-10-CM | POA: Diagnosis present

## 2021-05-21 DIAGNOSIS — I252 Old myocardial infarction: Secondary | ICD-10-CM

## 2021-05-21 DIAGNOSIS — E1151 Type 2 diabetes mellitus with diabetic peripheral angiopathy without gangrene: Secondary | ICD-10-CM | POA: Diagnosis present

## 2021-05-21 DIAGNOSIS — D631 Anemia in chronic kidney disease: Secondary | ICD-10-CM | POA: Diagnosis present

## 2021-05-21 DIAGNOSIS — J9620 Acute and chronic respiratory failure, unspecified whether with hypoxia or hypercapnia: Secondary | ICD-10-CM | POA: Diagnosis not present

## 2021-05-21 DIAGNOSIS — Z6841 Body Mass Index (BMI) 40.0 and over, adult: Secondary | ICD-10-CM

## 2021-05-21 DIAGNOSIS — N1831 Chronic kidney disease, stage 3a: Secondary | ICD-10-CM | POA: Diagnosis not present

## 2021-05-21 DIAGNOSIS — I5033 Acute on chronic diastolic (congestive) heart failure: Secondary | ICD-10-CM | POA: Diagnosis not present

## 2021-05-21 DIAGNOSIS — Z9981 Dependence on supplemental oxygen: Secondary | ICD-10-CM

## 2021-05-21 DIAGNOSIS — N390 Urinary tract infection, site not specified: Principal | ICD-10-CM | POA: Diagnosis present

## 2021-05-21 DIAGNOSIS — E785 Hyperlipidemia, unspecified: Secondary | ICD-10-CM | POA: Diagnosis present

## 2021-05-21 DIAGNOSIS — J9621 Acute and chronic respiratory failure with hypoxia: Secondary | ICD-10-CM | POA: Diagnosis not present

## 2021-05-21 DIAGNOSIS — I4811 Longstanding persistent atrial fibrillation: Secondary | ICD-10-CM | POA: Diagnosis not present

## 2021-05-21 DIAGNOSIS — I878 Other specified disorders of veins: Secondary | ICD-10-CM | POA: Diagnosis present

## 2021-05-21 DIAGNOSIS — R627 Adult failure to thrive: Secondary | ICD-10-CM | POA: Diagnosis present

## 2021-05-21 DIAGNOSIS — J9602 Acute respiratory failure with hypercapnia: Secondary | ICD-10-CM | POA: Diagnosis not present

## 2021-05-21 DIAGNOSIS — G2581 Restless legs syndrome: Secondary | ICD-10-CM | POA: Diagnosis present

## 2021-05-21 DIAGNOSIS — Z515 Encounter for palliative care: Secondary | ICD-10-CM | POA: Diagnosis not present

## 2021-05-21 DIAGNOSIS — E1122 Type 2 diabetes mellitus with diabetic chronic kidney disease: Secondary | ICD-10-CM | POA: Diagnosis present

## 2021-05-21 DIAGNOSIS — Z823 Family history of stroke: Secondary | ICD-10-CM

## 2021-05-21 DIAGNOSIS — E1165 Type 2 diabetes mellitus with hyperglycemia: Secondary | ICD-10-CM | POA: Diagnosis present

## 2021-05-21 DIAGNOSIS — Z885 Allergy status to narcotic agent status: Secondary | ICD-10-CM

## 2021-05-21 DIAGNOSIS — I4891 Unspecified atrial fibrillation: Secondary | ICD-10-CM | POA: Diagnosis not present

## 2021-05-21 DIAGNOSIS — N179 Acute kidney failure, unspecified: Secondary | ICD-10-CM | POA: Diagnosis present

## 2021-05-21 DIAGNOSIS — Z87891 Personal history of nicotine dependence: Secondary | ICD-10-CM

## 2021-05-21 DIAGNOSIS — N1832 Chronic kidney disease, stage 3b: Secondary | ICD-10-CM | POA: Diagnosis present

## 2021-05-21 DIAGNOSIS — J9811 Atelectasis: Secondary | ICD-10-CM | POA: Diagnosis not present

## 2021-05-21 DIAGNOSIS — G929 Unspecified toxic encephalopathy: Secondary | ICD-10-CM | POA: Diagnosis not present

## 2021-05-21 DIAGNOSIS — Z66 Do not resuscitate: Secondary | ICD-10-CM | POA: Diagnosis not present

## 2021-05-21 DIAGNOSIS — R609 Edema, unspecified: Secondary | ICD-10-CM

## 2021-05-21 DIAGNOSIS — J449 Chronic obstructive pulmonary disease, unspecified: Secondary | ICD-10-CM | POA: Diagnosis present

## 2021-05-21 DIAGNOSIS — I130 Hypertensive heart and chronic kidney disease with heart failure and stage 1 through stage 4 chronic kidney disease, or unspecified chronic kidney disease: Secondary | ICD-10-CM | POA: Diagnosis present

## 2021-05-21 DIAGNOSIS — E860 Dehydration: Secondary | ICD-10-CM | POA: Diagnosis present

## 2021-05-21 DIAGNOSIS — G894 Chronic pain syndrome: Secondary | ICD-10-CM | POA: Diagnosis present

## 2021-05-21 DIAGNOSIS — S30811A Abrasion of abdominal wall, initial encounter: Secondary | ICD-10-CM | POA: Diagnosis present

## 2021-05-21 DIAGNOSIS — E1169 Type 2 diabetes mellitus with other specified complication: Secondary | ICD-10-CM | POA: Diagnosis present

## 2021-05-21 DIAGNOSIS — Z794 Long term (current) use of insulin: Secondary | ICD-10-CM

## 2021-05-21 DIAGNOSIS — Z886 Allergy status to analgesic agent status: Secondary | ICD-10-CM

## 2021-05-21 DIAGNOSIS — D649 Anemia, unspecified: Secondary | ICD-10-CM | POA: Diagnosis not present

## 2021-05-21 DIAGNOSIS — Z79899 Other long term (current) drug therapy: Secondary | ICD-10-CM

## 2021-05-21 DIAGNOSIS — R451 Restlessness and agitation: Secondary | ICD-10-CM | POA: Diagnosis not present

## 2021-05-21 DIAGNOSIS — L600 Ingrowing nail: Secondary | ICD-10-CM | POA: Diagnosis present

## 2021-05-21 DIAGNOSIS — I872 Venous insufficiency (chronic) (peripheral): Secondary | ICD-10-CM | POA: Diagnosis present

## 2021-05-21 DIAGNOSIS — R531 Weakness: Secondary | ICD-10-CM | POA: Diagnosis present

## 2021-05-21 DIAGNOSIS — Z7901 Long term (current) use of anticoagulants: Secondary | ICD-10-CM

## 2021-05-21 DIAGNOSIS — Z7982 Long term (current) use of aspirin: Secondary | ICD-10-CM

## 2021-05-21 DIAGNOSIS — G4733 Obstructive sleep apnea (adult) (pediatric): Secondary | ICD-10-CM | POA: Diagnosis present

## 2021-05-21 DIAGNOSIS — I4821 Permanent atrial fibrillation: Secondary | ICD-10-CM | POA: Diagnosis present

## 2021-05-21 DIAGNOSIS — R32 Unspecified urinary incontinence: Secondary | ICD-10-CM | POA: Diagnosis present

## 2021-05-21 DIAGNOSIS — F419 Anxiety disorder, unspecified: Secondary | ICD-10-CM | POA: Diagnosis present

## 2021-05-21 DIAGNOSIS — Z888 Allergy status to other drugs, medicaments and biological substances status: Secondary | ICD-10-CM

## 2021-05-21 DIAGNOSIS — I4819 Other persistent atrial fibrillation: Secondary | ICD-10-CM | POA: Diagnosis not present

## 2021-05-21 DIAGNOSIS — I2510 Atherosclerotic heart disease of native coronary artery without angina pectoris: Secondary | ICD-10-CM | POA: Diagnosis present

## 2021-05-21 DIAGNOSIS — L304 Erythema intertrigo: Secondary | ICD-10-CM | POA: Diagnosis present

## 2021-05-21 DIAGNOSIS — Z7951 Long term (current) use of inhaled steroids: Secondary | ICD-10-CM

## 2021-05-21 DIAGNOSIS — Z781 Physical restraint status: Secondary | ICD-10-CM

## 2021-05-21 DIAGNOSIS — I5032 Chronic diastolic (congestive) heart failure: Secondary | ICD-10-CM | POA: Diagnosis not present

## 2021-05-21 DIAGNOSIS — Z8249 Family history of ischemic heart disease and other diseases of the circulatory system: Secondary | ICD-10-CM

## 2021-05-21 HISTORY — DX: Restless legs syndrome: G25.81

## 2021-05-21 HISTORY — DX: Morbid (severe) obesity due to excess calories: E66.01

## 2021-05-21 HISTORY — DX: Anemia in other chronic diseases classified elsewhere: D63.8

## 2021-05-21 HISTORY — DX: Other persistent atrial fibrillation: I48.19

## 2021-05-21 HISTORY — DX: Iron deficiency anemia, unspecified: D50.9

## 2021-05-21 HISTORY — DX: Heart failure, unspecified: I50.9

## 2021-05-21 HISTORY — DX: Benign neoplasm of unspecified adrenal gland: D35.00

## 2021-05-21 HISTORY — DX: Other specified disorders of veins: I87.8

## 2021-05-21 HISTORY — DX: Other chronic pain: G89.29

## 2021-05-21 HISTORY — DX: Chronic kidney disease, stage 3b: N18.32

## 2021-05-21 HISTORY — DX: Type 2 diabetes mellitus with other specified complication: E11.69

## 2021-05-21 HISTORY — DX: Chronic diastolic (congestive) heart failure: I50.32

## 2021-05-21 LAB — I-STAT ARTERIAL BLOOD GAS, ED
Acid-Base Excess: 15 mmol/L — ABNORMAL HIGH (ref 0.0–2.0)
Bicarbonate: 39.1 mmol/L — ABNORMAL HIGH (ref 20.0–28.0)
Calcium, Ion: 1.14 mmol/L — ABNORMAL LOW (ref 1.15–1.40)
HCT: 26 % — ABNORMAL LOW (ref 36.0–46.0)
Hemoglobin: 8.8 g/dL — ABNORMAL LOW (ref 12.0–15.0)
O2 Saturation: 99 %
Patient temperature: 98.2
Potassium: 2.8 mmol/L — ABNORMAL LOW (ref 3.5–5.1)
Sodium: 140 mmol/L (ref 135–145)
TCO2: 40 mmol/L — ABNORMAL HIGH (ref 22–32)
pCO2 arterial: 44.6 mmHg (ref 32–48)
pH, Arterial: 7.55 — ABNORMAL HIGH (ref 7.35–7.45)
pO2, Arterial: 115 mmHg — ABNORMAL HIGH (ref 83–108)

## 2021-05-21 LAB — URINALYSIS, ROUTINE W REFLEX MICROSCOPIC
Bilirubin Urine: NEGATIVE
Glucose, UA: NEGATIVE mg/dL
Ketones, ur: NEGATIVE mg/dL
Nitrite: NEGATIVE
Protein, ur: 30 mg/dL — AB
Specific Gravity, Urine: 1.015 (ref 1.005–1.030)
pH: 6 (ref 5.0–8.0)

## 2021-05-21 LAB — COMPREHENSIVE METABOLIC PANEL
ALT: 14 U/L (ref 0–44)
AST: 18 U/L (ref 15–41)
Albumin: 3.3 g/dL — ABNORMAL LOW (ref 3.5–5.0)
Alkaline Phosphatase: 43 U/L (ref 38–126)
Anion gap: 10 (ref 5–15)
BUN: 26 mg/dL — ABNORMAL HIGH (ref 8–23)
CO2: 37 mmol/L — ABNORMAL HIGH (ref 22–32)
Calcium: 9.1 mg/dL (ref 8.9–10.3)
Chloride: 96 mmol/L — ABNORMAL LOW (ref 98–111)
Creatinine, Ser: 1.12 mg/dL — ABNORMAL HIGH (ref 0.44–1.00)
GFR, Estimated: 52 mL/min — ABNORMAL LOW (ref >60)
Glucose, Bld: 149 mg/dL — ABNORMAL HIGH (ref 70–99)
Potassium: 3 mmol/L — ABNORMAL LOW (ref 3.5–5.1)
Sodium: 143 mmol/L (ref 135–145)
Total Bilirubin: 0.4 mg/dL (ref 0.3–1.2)
Total Protein: 6.6 g/dL (ref 6.5–8.1)

## 2021-05-21 LAB — CBC WITH DIFFERENTIAL/PLATELET
Abs Immature Granulocytes: 0.02 10*3/uL (ref 0.00–0.07)
Basophils Absolute: 0 10*3/uL (ref 0.0–0.1)
Basophils Relative: 1 %
Eosinophils Absolute: 0.2 10*3/uL (ref 0.0–0.5)
Eosinophils Relative: 3 %
HCT: 29 % — ABNORMAL LOW (ref 36.0–46.0)
Hemoglobin: 8.1 g/dL — ABNORMAL LOW (ref 12.0–15.0)
Immature Granulocytes: 0 %
Lymphocytes Relative: 12 %
Lymphs Abs: 0.9 10*3/uL (ref 0.7–4.0)
MCH: 23.5 pg — ABNORMAL LOW (ref 26.0–34.0)
MCHC: 27.9 g/dL — ABNORMAL LOW (ref 30.0–36.0)
MCV: 84.1 fL (ref 80.0–100.0)
Monocytes Absolute: 0.4 10*3/uL (ref 0.1–1.0)
Monocytes Relative: 6 %
Neutro Abs: 6 10*3/uL (ref 1.7–7.7)
Neutrophils Relative %: 78 %
Platelets: 259 10*3/uL (ref 150–400)
RBC: 3.45 MIL/uL — ABNORMAL LOW (ref 3.87–5.11)
RDW: 19.2 % — ABNORMAL HIGH (ref 11.5–15.5)
WBC: 7.5 10*3/uL (ref 4.0–10.5)
nRBC: 0 % (ref 0.0–0.2)

## 2021-05-21 LAB — GLUCOSE, CAPILLARY: Glucose-Capillary: 180 mg/dL — ABNORMAL HIGH (ref 70–99)

## 2021-05-21 LAB — MAGNESIUM: Magnesium: 1.8 mg/dL (ref 1.7–2.4)

## 2021-05-21 LAB — BRAIN NATRIURETIC PEPTIDE: B Natriuretic Peptide: 85.3 pg/mL (ref 0.0–100.0)

## 2021-05-21 MED ORDER — ADULT MULTIVITAMIN W/MINERALS CH
1.0000 | ORAL_TABLET | Freq: Every day | ORAL | Status: DC
  Administered 2021-05-22 – 2021-05-24 (×3): 1 via ORAL
  Filled 2021-05-21 (×3): qty 1

## 2021-05-21 MED ORDER — MORPHINE SULFATE (PF) 4 MG/ML IV SOLN
4.0000 mg | Freq: Once | INTRAVENOUS | Status: AC
  Administered 2021-05-21: 4 mg via INTRAVENOUS
  Filled 2021-05-21: qty 1

## 2021-05-21 MED ORDER — UMECLIDINIUM BROMIDE 62.5 MCG/ACT IN AEPB
1.0000 | INHALATION_SPRAY | Freq: Every day | RESPIRATORY_TRACT | Status: DC
  Administered 2021-05-22 – 2021-05-24 (×3): 1 via RESPIRATORY_TRACT
  Filled 2021-05-21: qty 7

## 2021-05-21 MED ORDER — POLYETHYLENE GLYCOL 3350 17 G PO PACK: 17.0000 g | PACK | Freq: Every day | ORAL | Status: DC | PRN

## 2021-05-21 MED ORDER — TIOTROPIUM BROMIDE MONOHYDRATE 18 MCG IN CAPS: 18.0000 ug | ORAL_CAPSULE | Freq: Every day | RESPIRATORY_TRACT | Status: DC

## 2021-05-21 MED ORDER — POTASSIUM CHLORIDE CRYS ER 20 MEQ PO TBCR
40.0000 meq | EXTENDED_RELEASE_TABLET | Freq: Once | ORAL | Status: AC
Start: 2021-05-21 — End: 2021-05-21
  Administered 2021-05-21: 40 meq via ORAL
  Filled 2021-05-21: qty 2

## 2021-05-21 MED ORDER — ASPIRIN EC 81 MG PO TBEC
81.0000 mg | DELAYED_RELEASE_TABLET | Freq: Every day | ORAL | Status: DC
  Administered 2021-05-21 – 2021-05-23 (×3): 81 mg via ORAL
  Filled 2021-05-21 (×3): qty 1

## 2021-05-21 MED ORDER — ALBUTEROL SULFATE HFA 108 (90 BASE) MCG/ACT IN AERS: 2.0000 | INHALATION_SPRAY | Freq: Four times a day (QID) | RESPIRATORY_TRACT | Status: DC | PRN

## 2021-05-21 MED ORDER — FUROSEMIDE 10 MG/ML IJ SOLN
40.0000 mg | Freq: Once | INTRAMUSCULAR | Status: AC
  Administered 2021-05-21: 40 mg via INTRAVENOUS
  Filled 2021-05-21: qty 4

## 2021-05-21 MED ORDER — OXYCODONE HCL 5 MG PO TABS: 5.0000 mg | ORAL_TABLET | Freq: Four times a day (QID) | ORAL | Status: DC | PRN

## 2021-05-21 MED ORDER — INSULIN ASPART 100 UNIT/ML IJ SOLN
0.0000 [IU] | Freq: Every day | INTRAMUSCULAR | Status: DC
  Administered 2021-05-22: 2 [IU] via SUBCUTANEOUS
  Administered 2021-05-24: 3 [IU] via SUBCUTANEOUS

## 2021-05-21 MED ORDER — MELATONIN 3 MG PO TABS
3.0000 mg | ORAL_TABLET | Freq: Every evening | ORAL | Status: DC | PRN
  Administered 2021-05-24 – 2021-05-25 (×2): 3 mg via ORAL
  Filled 2021-05-21 (×4): qty 1

## 2021-05-21 MED ORDER — DILTIAZEM HCL ER 60 MG PO CP12: 60.0000 mg | ORAL_CAPSULE | Freq: Two times a day (BID) | ORAL | Status: DC

## 2021-05-21 MED ORDER — MULTIVITAMIN ADULT PO TABS
ORAL_TABLET | Freq: Every day | ORAL | Status: DC
Start: 2021-05-21 — End: 2021-05-21

## 2021-05-21 MED ORDER — ONDANSETRON HCL 4 MG/2ML IJ SOLN
4.0000 mg | Freq: Four times a day (QID) | INTRAMUSCULAR | Status: DC | PRN
  Filled 2021-05-21: qty 2

## 2021-05-21 MED ORDER — ROPINIROLE HCL 0.25 MG PO TABS
0.2500 mg | ORAL_TABLET | Freq: Every day | ORAL | Status: DC | PRN
  Administered 2021-05-25: 0.25 mg via ORAL
  Filled 2021-05-21 (×3): qty 1

## 2021-05-21 MED ORDER — APIXABAN 5 MG PO TABS
5.0000 mg | ORAL_TABLET | Freq: Two times a day (BID) | ORAL | Status: DC
  Administered 2021-05-21 – 2021-05-24 (×7): 5 mg via ORAL
  Filled 2021-05-21 (×7): qty 1

## 2021-05-21 MED ORDER — IPRATROPIUM-ALBUTEROL 0.5-2.5 (3) MG/3ML IN SOLN
3.0000 mL | Freq: Four times a day (QID) | RESPIRATORY_TRACT | Status: DC | PRN
  Administered 2021-05-25: 3 mL via RESPIRATORY_TRACT
  Filled 2021-05-21: qty 3

## 2021-05-21 MED ORDER — ONDANSETRON HCL 4 MG/2ML IJ SOLN
4.0000 mg | Freq: Once | INTRAMUSCULAR | Status: AC
  Administered 2021-05-21: 4 mg via INTRAVENOUS
  Filled 2021-05-21: qty 2

## 2021-05-21 MED ORDER — POTASSIUM CHLORIDE CRYS ER 20 MEQ PO TBCR
40.0000 meq | EXTENDED_RELEASE_TABLET | Freq: Two times a day (BID) | ORAL | Status: AC
  Administered 2021-05-21 – 2021-05-22 (×2): 40 meq via ORAL
  Filled 2021-05-21 (×2): qty 2

## 2021-05-21 MED ORDER — IPRATROPIUM-ALBUTEROL 0.5-2.5 (3) MG/3ML IN SOLN
3.0000 mL | Freq: Once | RESPIRATORY_TRACT | Status: AC
  Administered 2021-05-21: 3 mL via RESPIRATORY_TRACT
  Filled 2021-05-21: qty 3

## 2021-05-21 MED ORDER — LACTATED RINGERS IV SOLN
INTRAVENOUS | Status: DC
Start: 2021-05-21 — End: 2021-05-21
  Filled 2021-05-21: qty 1000

## 2021-05-21 MED ORDER — FLUTICASONE FUROATE-VILANTEROL 200-25 MCG/ACT IN AEPB
1.0000 | INHALATION_SPRAY | Freq: Every day | RESPIRATORY_TRACT | Status: DC
  Administered 2021-05-22 – 2021-05-24 (×3): 1 via RESPIRATORY_TRACT
  Filled 2021-05-21: qty 28

## 2021-05-21 MED ORDER — FLUCONAZOLE 150 MG PO TABS
150.0000 mg | ORAL_TABLET | Freq: Once | ORAL | Status: AC
  Administered 2021-05-21: 150 mg via ORAL
  Filled 2021-05-21: qty 1

## 2021-05-21 MED ORDER — ROSUVASTATIN CALCIUM 20 MG PO TABS
20.0000 mg | ORAL_TABLET | Freq: Every day | ORAL | Status: DC
  Administered 2021-05-21 – 2021-05-24 (×4): 20 mg via ORAL
  Filled 2021-05-21 (×4): qty 1

## 2021-05-21 MED ORDER — SENNOSIDES-DOCUSATE SODIUM 8.6-50 MG PO TABS
2.0000 | ORAL_TABLET | Freq: Two times a day (BID) | ORAL | Status: DC
  Administered 2021-05-21 – 2021-05-24 (×6): 2 via ORAL
  Filled 2021-05-21 (×6): qty 2

## 2021-05-21 MED ORDER — FERROUS SULFATE 325 (65 FE) MG PO TABS
325.0000 mg | ORAL_TABLET | Freq: Every day | ORAL | Status: DC
  Administered 2021-05-22 – 2021-05-24 (×3): 325 mg via ORAL
  Filled 2021-05-21 (×3): qty 1

## 2021-05-21 MED ORDER — VITAMIN B-12 1000 MCG PO TABS
1000.0000 ug | ORAL_TABLET | Freq: Every day | ORAL | Status: DC
  Administered 2021-05-22 – 2021-05-24 (×3): 1000 ug via ORAL
  Filled 2021-05-21 (×3): qty 1

## 2021-05-21 MED ORDER — ALBUTEROL SULFATE (2.5 MG/3ML) 0.083% IN NEBU: 2.5000 mg | INHALATION_SOLUTION | Freq: Four times a day (QID) | RESPIRATORY_TRACT | Status: DC | PRN

## 2021-05-21 MED ORDER — INSULIN ASPART 100 UNIT/ML IJ SOLN
0.0000 [IU] | Freq: Three times a day (TID) | INTRAMUSCULAR | Status: DC
  Administered 2021-05-22: 4 [IU] via SUBCUTANEOUS
  Administered 2021-05-22: 7 [IU] via SUBCUTANEOUS
  Administered 2021-05-22: 15 [IU] via SUBCUTANEOUS
  Administered 2021-05-23 (×2): 11 [IU] via SUBCUTANEOUS
  Administered 2021-05-23 – 2021-05-24 (×2): 15 [IU] via SUBCUTANEOUS
  Administered 2021-05-24 (×2): 11 [IU] via SUBCUTANEOUS
  Administered 2021-05-25: 20 [IU] via SUBCUTANEOUS

## 2021-05-21 MED ORDER — SODIUM CHLORIDE 0.9 % IV SOLN
2.0000 g | INTRAVENOUS | Status: DC
  Administered 2021-05-21 – 2021-05-24 (×4): 2 g via INTRAVENOUS
  Filled 2021-05-21 (×4): qty 20

## 2021-05-21 NOTE — ED Notes (Signed)
[facility] arrived to transport pt. Pt stable at time of departure ?

## 2021-05-21 NOTE — ED Provider Notes (Signed)
?  Physical Exam  ?BP (!) 106/59   Pulse (!) 113   Temp 98.2 ?F (36.8 ?C) (Oral)   Resp 15   SpO2 90%  ? ?Physical Exam ? ?Procedures  ?Procedures ? ?ED Course / MDM  ?  ?Medical Decision Making ?Amount and/or Complexity of Data Reviewed ?Labs: ordered. ?Radiology: ordered. ? ?Risk ?Prescription drug management. ?Decision regarding hospitalization. ? ? ?76 yo female seen and evaluated by Dr. Gilford Raid.  Patient admitted to hospitalist team.  Awaiting bed. ?Hypokalemia ?Anemia ?AKI ?A fib- on eliquis ? ? ? ? ? ?  ?Pattricia Boss, MD ?05/22/21 0013 ? ?

## 2021-05-21 NOTE — Progress Notes (Signed)
Manufacturing engineer [facility]) Hospital Liaison Note ? ?This is a current hospice patient with [facility]. Please call with any questions or concerns. We will follow for coordination of hospice at discharge.  ? ?Roselee Nova, LCSW ?[place] [facility] Liaison ?[phone] ?

## 2021-05-21 NOTE — ED Triage Notes (Signed)
Pt brought in by [facility], pt states she has been generally weak the past 4 days and realized she hasnt urinated for 2 days. While cleaning patient, RN noted excoriated abdominal folds bilaterally, with white residue ,pt voices that is cream that was put on her few weeks ago. Pt also is very tender in vaginal area, states she hasnt been right down there since foley was placed during her last hospitization. 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  ?

## 2021-05-21 NOTE — ED Notes (Signed)
RT note: Pt. arrived [facility] via [facility] on 3lpm n/c, "uses all the time" per pt., also uses X breathing medicine, inhalers. ?

## 2021-05-21 NOTE — ED Provider Notes (Signed)
?[facility] ?Provider Note ? ? ?CSN: [id number] ?Arrival date & time: 05/22/2021  0856 ? ?  ? ?History ? ?Chief Complaint  ?Patient presents with  ? Urinary Retention  ? ? ?[name] is a 76 y.o. female. ? ?Pt is a 76 yo female with a pmhx significant for cad, htn, dm2, hyperlipidemia, chf, chronic resp failure (on 3L), atrial fib (on Eliquis) and morbid obesity.  Pt said she has been weak the last 4 days.  Pt does not think she's urinated in 2 days.  She does not have any urge to urinate.  She has been eating and drinking.  She denies fevers. ? ? ?  ? ?Home Medications ?Prior to Admission medications   ?Medication Sig Start Date End Date Taking? Authorizing Provider  ?acetaminophen (TYLENOL) 500 MG tablet Take 500 mg by mouth every 8 (eight) hours as needed for moderate pain or headache.   Yes [provider]  ?albuterol (VENTOLIN HFA) 108 (90 Base) MCG/ACT inhaler Inhale 2 puffs into the lungs every 6 (six) hours as needed for wheezing or shortness of breath.   Yes [provider]  ?apixaban (ELIQUIS) 5 MG TABS tablet Take 1 tablet (5 mg total) by mouth 2 (two) times daily. 04/17/21  Yes Caren Griffins, MD  ?aspirin EC 81 MG tablet Take 81 mg by mouth daily. Swallow whole.   Yes [provider]  ?Adair Patter 200-25 MCG/INH AEPB Inhale 1 puff into the lungs daily. 04/22/20  Yes [provider]  ?cetirizine (ZYRTEC) 10 MG tablet Take 10 mg by mouth daily.   Yes [provider]  ?Cholecalciferol (VITAMIN D3) 125 MCG (5000 UT) CAPS Take 5,000 Units by mouth daily.   Yes [provider]  ?ciprofloxacin (CIPRO) 500 MG tablet Take 500 mg by mouth every 12 (twelve) hours. 05/04/21  Yes [provider]  ?DEXTROMETHORPHAN HBR PO Take 5 mg by mouth See admin instructions. Every 2 hours as needed for cough suppressant/sore throat relief   Yes [provider]  ?diclofenac Sodium (VOLTAREN) 1 % GEL Apply 4 g topically in the  morning, at noon, and at bedtime. Apply to bilateral knees for arthritic pain   Yes [provider]  ?diltiazem (CARDIZEM SR) 60 MG 12 hr capsule Take 1 capsule (60 mg total) by mouth every 12 (twelve) hours. 04/17/21  Yes Caren Griffins, MD  ?doxycycline (VIBRA-TABS) 100 MG tablet Take 100 mg by mouth 2 (two) times daily. 05/07/21  Yes [provider]  ?estradiol (ESTRACE) 0.1 MG/GM vaginal cream Place 1 Applicatorful vaginally daily.   Yes [provider]  ?ferrous sulfate 325 (65 FE) MG tablet Take 1 tablet (325 mg total) by mouth daily with breakfast. 02/06/21  Yes [name], MD  ?fluticasone (FLONASE) 50 MCG/ACT nasal spray Place 1 spray into both nostrils daily.   Yes [provider]  ?glucose 4 GM chewable tablet Chew 1 tablet by mouth daily as needed for low blood sugar.   Yes [provider]  ?HYDROcodone-acetaminophen (NORCO/VICODIN) 5-325 MG tablet Take 1 tablet by mouth every 6 (six) hours as needed for moderate pain. ?Patient taking differently: Take 1 tablet by mouth every 4 (four) hours as needed for severe pain. 04/17/21  Yes Caren Griffins, MD  ?insulin NPH-regular Human (70-30) 100 UNIT/ML injection Inject 73 Units into the skin with breakfast, with lunch, and with evening meal. Hold if CBG less than 120 or if resident is going to eat less than 50 %  of meal.   Yes [provider]  ?ipratropium-albuterol (DUONEB) 0.5-2.5 (3) MG/3ML SOLN Take 3 mLs by nebulization every 6 (six) hours as needed (wheezing).   Yes [provider]  ?Ixekizumab 80 MG/ML SOAJ Inject 1 Dose into the skin every 30 (thirty) days.   Yes [provider]  ?Magnesium Hydroxide (MILK OF MAGNESIA PO) Take 30 mLs by mouth daily. Every 24 hrs for constipation   Yes [provider]  ?methocarbamol (ROBAXIN) 500 MG tablet Take 500 mg by mouth every 6 (six) hours as needed for muscle spasms.   Yes [provider]  ?methylPREDNISolone  (MEDROL) 4 MG tablet Take 4 mg by mouth daily. 4 mg qam and 8 mg hs per paperwork from [facility]   Yes [provider]  ?Multiple Vitamin (MULTIVITAMIN ADULT PO) Take 1 tablet by mouth daily.   Yes [provider]  ?ondansetron (ZOFRAN) 4 MG tablet Take 4 mg by mouth every 8 (eight) hours as needed for nausea or vomiting.   Yes [provider]  ?polyethylene glycol (MIRALAX / GLYCOLAX) 17 g packet Take 17 g by mouth daily as needed for mild constipation.   Yes [provider]  ?pregabalin (LYRICA) 25 MG capsule Take 1 capsule (25 mg total) by mouth 2 (two) times daily. 04/02/21  Yes Cherene Altes, MD  ?rOPINIRole (REQUIP) 0.25 MG tablet Take 0.25 mg by mouth daily as needed (RLS).   Yes [provider]  ?rosuvastatin (CRESTOR) 20 MG tablet Take 20 mg by mouth at bedtime.   Yes [provider]  ?senna-docusate (SENOKOT-S) 8.6-50 MG tablet Take 1 tablet by mouth 2 (two) times daily. 02/05/21  Yes [name], MD  ?Sodium Phosphates (FLEET ENEMA RE) Place rectally See admin instructions. Administer x 1 dose now for constipation,every 24 hrs as needed for constipation not relieved by MOM or dulcolax   Yes [provider]  ?tiotropium (SPIRIVA HANDIHALER) 18 MCG inhalation capsule Place 18 mcg into inhaler and inhale daily.   Yes [provider]  ?tiZANidine (ZANAFLEX) 2 MG tablet Take by mouth every 8 (eight) hours as needed for muscle spasms. 05/11/21  Yes [provider]  ?torsemide 40 MG TABS Take 40 mg by mouth daily. ?Patient taking differently: Take 60 mg by mouth daily. For chf 04/17/21  Yes Gherghe, Vella Redhead, MD  ?vitamin B-12 (CYANOCOBALAMIN) 1000 MCG tablet Take 1,000 mcg by mouth daily.   Yes [provider]  ?Wound Dressings (MEDIHONEY CA ALGINATE 4"X5") PADS Apply topically See admin instructions. Cleanse wound to lle and rle with ns,pat dry,apply medihoney,cover with protective dressing and ace bandage,change  twice daily and prn for soiled or dislodged dressing   Yes [provider]  ?Zinc Oxide 40 % PSTE Apply topically See admin instructions. Apply to reddened area on groin topically everyday shift for redness irritation   Yes [provider]  ?Lidocaine 4 % PTCH Apply 1 application. topically daily. Left shoulder    [provider]  ?melatonin 5 MG TABS Take 1 tablet (5 mg total) by mouth at bedtime. 02/05/21   [name], MD  ?Duanne Moron 0.5 MG tablet Take 0.5 mg by mouth every 8 (eight) hours as needed. 05/11/21   [provider]  ?   ? ?Allergies    ?Metaxalone, Nitroglycerin, Tramadol-acetaminophen, Icosapent ethyl, Brompheniramine-pseudoeph, Meprobamate, Metformin, Methyldopa, Metoprolol, Procaine, Pseudoephedrine hcl, and Tramadol   ? ?Review of Systems   ?Review of Systems  ?Genitourinary:  Positive for difficulty urinating.  ?Neurological:  Positive for weakness.  ?All other systems reviewed and are negative. ? ?Physical Exam ?Updated Vital Signs ?BP (!) 106/59   Pulse (!) 113   Temp 98.2 ?F (36.8 ?C) (Oral)   Resp 15   SpO2 90%  ?Physical Exam ?Vitals and nursing note reviewed.  ?Constitutional:   ?   Appearance: Normal appearance. She is obese.  ?HENT:  ?   Head: Normocephalic and atraumatic.  ?   Right Ear: External ear normal.  ?   Left Ear: External ear normal.  ?   Nose: Nose normal.  ?   Mouth/Throat:  ?   Mouth: Mucous membranes are moist.  ?   Pharynx: Oropharynx is clear.  ?Eyes:  ?   Extraocular Movements: Extraocular movements intact.  ?   Conjunctiva/sclera: Conjunctivae normal.  ?   Pupils: Pupils are equal, round, and reactive to light.  ?Cardiovascular:  ?   Rate and Rhythm: Normal rate. Rhythm irregular.  ?   Pulses: Normal pulses.  ?   Heart sounds: Normal heart sounds.  ?Pulmonary:  ?   Effort: Pulmonary effort is normal.  ?   Breath sounds: Normal breath sounds.  ?Abdominal:  ?   General: Abdomen is flat. Bowel sounds are normal.  ?   Palpations:  Abdomen is soft.  ?Genitourinary: ?   Comments: Excoriation in the GU area ?Musculoskeletal:  ?   Cervical back: Normal range of motion and neck supple.  ?   Right lower leg: Edema present.  ?   Left lower leg: Edema pres

## 2021-05-21 NOTE — ED Notes (Signed)
Pt. Refused catheter.  ?

## 2021-05-21 NOTE — Progress Notes (Signed)
Pt set up on cpap for the night. ?

## 2021-05-21 NOTE — H&P (Signed)
?History and Physical ? ?[name] IRJ:[id number] DOB: [birth year] DOA: 06/05/2021 ? ?Referring physician: Accepted by Dr. Louanne Belton, [facility], hospitalist service. ?PCP: Carrie Simmer, NP  ?Outpatient Specialists: Cardiology. ?Patient coming from: SN[facility] through [facility] ED ? ?Chief Complaint: Generalized weakness and urinary retention of the past 2-3 days. ? ?HPI: [name] is a 76 y.o. female with medical history significant for severe morbid obesity, type 2 diabetes, hypertension, OSA on CPAP, chronic hypoxic respiratory failure on 3 L [place], chronic pain syndrome on opiates, chronic bilateral lower extremity wounds, chronic diastolic CHF, permanent A-fib on Eliquis, current hospice patient with [facility], who presented to [facility] ED from SNF due to generalized weakness and urinary retention for the past 2-3 days.  She has been eating and drinking without difficulty.  Has been avoiding salt intake as instructed.  No fevers.  In the ED, during her physical exam she was noted to have abdominal edema and extensive perineal area excoriation and oozing of blood from that area.  Also noted to have electrolytes abnormalities.  Patient declined Foley catheter placement.  EDP requested admission for further evaluation and management of her symptomatology.  Patient was accepted as a direct admit by Dr. Louanne Belton, [facility], hospitalist service to [facility] long hospital telemetry unit.   ? ?At the time of this visit, she endorses that she has been getting weaker to the point that she could not walk yesterday.  At baseline, she ambulates with a walker.  She currently resides at [facility] for physical rehab. ? ?ED Course: Tmax 98.8.  BP 110/68, pulse 70, respiration rate 17.  Oxygen saturation 97% on 2 L.  Lab studies remarkable for serum potassium 2.8, creatinine 1.12 with GFR 52.  BNP 85.  Hemoglobin 8.8K, WBC 7.5, UA positive for pyuria.  Urine culture in process. ? ?Review of Systems: ?Review of systems as noted in the HPI. All  other systems reviewed and are negative. ? ? ?Past Medical History:  ?Diagnosis Date  ? Coronary artery disease   ? Diabetes mellitus without complication ([place])   ? Heart failure ([place])   ? chronic  oxygen 3 liters  ? Hyperlipidemia   ? Hypertension   ? OSA (obstructive sleep apnea)   ? ?Past Surgical History:  ?Procedure Laterality Date  ? CARDIAC CATHETERIZATION    ? pheochromocytoma    ? TONSILLECTOMY    ? ? ?Social History:  reports that she quit smoking about 31 years ago. Her smoking use included cigarettes. She has a 20.00 pack-year smoking history. She has never used smokeless tobacco. She reports that she does not currently use alcohol. She reports that she does not currently use drugs. ? ? ?Allergies  ?Allergen Reactions  ? Metaxalone Anaphylaxis  ? Nitroglycerin   ?  BP bottoms out  ? Tramadol-Acetaminophen Anaphylaxis  ? Icosapent Ethyl Other (See Comments)  ?  Stomach pain, gas, diarrhea, headache from Vascepa  ? Brompheniramine-Pseudoeph   ?  Feels drunk  ? Meprobamate Other (See Comments)  ?  unknown  ? Metformin Nausea Only  ?  "flushes"  ? Methyldopa   ?  Other reaction(s): Unknown  ? Metoprolol   ?  Other reaction(s): Unknown  ? Procaine   ?  Passes out  ? Pseudoephedrine Hcl   ?  "feel drunk"  ? Tramadol   ?  "Made me go wonky, got fluid in my lungs"  ? ? ?Family History  ?Problem Relation Age of Onset  ? Heart attack Mother   ? Stroke Mother   ?  Heart attack Father   ?  ? ? ?Prior to Admission medications   ?Medication Sig Start Date End Date Taking? Authorizing Provider  ?acetaminophen (TYLENOL) 500 MG tablet Take 500 mg by mouth every 8 (eight) hours as needed for moderate pain or headache.   Yes [provider]  ?albuterol (VENTOLIN HFA) 108 (90 Base) MCG/ACT inhaler Inhale 2 puffs into the lungs every 6 (six) hours as needed for wheezing or shortness of breath.   Yes [provider]  ?apixaban (ELIQUIS) 5 MG TABS tablet Take 1 tablet (5 mg total) by mouth 2 (two) times daily.  04/17/21  Yes Caren Griffins, MD  ?aspirin EC 81 MG tablet Take 81 mg by mouth daily. Swallow whole.   Yes [provider]  ?[place]air Patter 200-25 MCG/INH AEPB Inhale 1 puff into the lungs daily. 04/22/20  Yes [provider]  ?cetirizine (ZYRTEC) 10 MG tablet Take 10 mg by mouth daily.   Yes [provider]  ?Cholecalciferol (VITAMIN D3) 125 MCG (5000 UT) CAPS Take 5,000 Units by mouth daily.   Yes [provider]  ?ciprofloxacin (CIPRO) 500 MG tablet Take 500 mg by mouth every 12 (twelve) hours. 05/04/21  Yes [provider]  ?DEXTROMETHORPHAN HBR PO Take 5 mg by mouth See admin instructions. Every 2 hours as needed for cough suppressant/sore throat relief   Yes [provider]  ?diclofenac Sodium (VOLTAREN) 1 % GEL Apply 4 g topically in the morning, at noon, and at bedtime. Apply to bilateral knees for arthritic pain   Yes [provider]  ?diltiazem (CARDIZEM SR) 60 MG 12 hr capsule Take 1 capsule (60 mg total) by mouth every 12 (twelve) hours. 04/17/21  Yes Caren Griffins, MD  ?doxycycline (VIBRA-TABS) 100 MG tablet Take 100 mg by mouth 2 (two) times daily. 05/07/21  Yes [provider]  ?estradiol (ESTRACE) 0.1 MG/GM vaginal cream Place 1 Applicatorful vaginally daily.   Yes [provider]  ?ferrous sulfate 325 (65 FE) MG tablet Take 1 tablet (325 mg total) by mouth daily with breakfast. 02/06/21  Yes [name], MD  ?fluticasone (FLONASE) 50 MCG/ACT nasal spray Place 1 spray into both nostrils daily.   Yes [provider]  ?glucose 4 GM chewable tablet Chew 1 tablet by mouth daily as needed for low blood sugar.   Yes [provider]  ?HYDROcodone-acetaminophen (NORCO/VICODIN) 5-325 MG tablet Take 1 tablet by mouth every 6 (six) hours as needed for moderate pain. ?Patient taking differently: Take 1 tablet by mouth every 4 (four) hours as needed for severe pain. 04/17/21  Yes Caren Griffins, MD  ?insulin  NPH-regular Human (70-30) 100 UNIT/ML injection Inject 73 Units into the skin with breakfast, with lunch, and with evening meal. Hold if CBG less than 120 or if resident is going to eat less than 50 % of meal.   Yes [provider]  ?ipratropium-albuterol (DUONEB) 0.5-2.5 (3) MG/3ML SOLN Take 3 mLs by nebulization every 6 (six) hours as needed (wheezing).   Yes [provider]  ?Ixekizumab 80 MG/ML SOAJ Inject 1 Dose into the skin every 30 (thirty) days.   Yes [provider]  ?Magnesium Hydroxide (MILK OF MAGNESIA PO) Take 30 mLs by mouth daily. Every 24 hrs for constipation   Yes [provider]  ?methocarbamol (ROBAXIN) 500 MG tablet Take 500 mg by mouth every 6 (six) hours as needed for muscle spasms.   Yes [provider]  ?methylPREDNISolone (MEDROL) 4 MG  tablet Take 4 mg by mouth daily. 4 mg qam and 8 mg hs per paperwork from [facility]   Yes [provider]  ?Multiple Vitamin (MULTIVITAMIN ADULT PO) Take 1 tablet by mouth daily.   Yes [provider]  ?ondansetron (ZOFRAN) 4 MG tablet Take 4 mg by mouth every 8 (eight) hours as needed for nausea or vomiting.   Yes [provider]  ?polyethylene glycol (MIRALAX / GLYCOLAX) 17 g packet Take 17 g by mouth daily as needed for mild constipation.   Yes [provider]  ?pregabalin (LYRICA) 25 MG capsule Take 1 capsule (25 mg total) by mouth 2 (two) times daily. 04/02/21  Yes Cherene Altes, MD  ?rOPINIRole (REQUIP) 0.25 MG tablet Take 0.25 mg by mouth daily as needed (RLS).   Yes [provider]  ?rosuvastatin (CRESTOR) 20 MG tablet Take 20 mg by mouth at bedtime.   Yes [provider]  ?senna-docusate (SENOKOT-S) 8.6-50 MG tablet Take 1 tablet by mouth 2 (two) times daily. 02/05/21  Yes [name], MD  ?Sodium Phosphates (FLEET ENEMA RE) Place rectally See admin instructions. Administer x 1 dose now for constipation,every 24 hrs as needed for constipation not  relieved by MOM or dulcolax   Yes [provider]  ?tiotropium (SPIRIVA HANDIHALER) 18 MCG inhalation capsule Place 18 mcg into inhaler and inhale daily.   Yes [provider]  ?Leo Rod

## 2021-05-21 NOTE — ED Notes (Signed)
Called [facility] to transport patient to  ?[place] 4E room# 6219 ?

## 2021-05-21 NOTE — Progress Notes (Signed)
@  approx 2335 Pt removed CPAP endorsing it didn't feel right. Of note: home settings are unknown. [place] replaced. Will continue to monitor. ?

## 2021-05-22 ENCOUNTER — Inpatient Hospital Stay (HOSPITAL_COMMUNITY)

## 2021-05-22 DIAGNOSIS — D649 Anemia, unspecified: Secondary | ICD-10-CM

## 2021-05-22 DIAGNOSIS — R531 Weakness: Secondary | ICD-10-CM | POA: Diagnosis not present

## 2021-05-22 DIAGNOSIS — I4811 Longstanding persistent atrial fibrillation: Secondary | ICD-10-CM | POA: Diagnosis not present

## 2021-05-22 LAB — COMPREHENSIVE METABOLIC PANEL
ALT: 18 U/L (ref 0–44)
AST: 21 U/L (ref 15–41)
Albumin: 2.9 g/dL — ABNORMAL LOW (ref 3.5–5.0)
Alkaline Phosphatase: 43 U/L (ref 38–126)
Anion gap: 12 (ref 5–15)
BUN: 22 mg/dL (ref 8–23)
CO2: 32 mmol/L (ref 22–32)
Calcium: 8.6 mg/dL — ABNORMAL LOW (ref 8.9–10.3)
Chloride: 97 mmol/L — ABNORMAL LOW (ref 98–111)
Creatinine, Ser: 1.02 mg/dL — ABNORMAL HIGH (ref 0.44–1.00)
GFR, Estimated: 58 mL/min — ABNORMAL LOW (ref >60)
Glucose, Bld: 190 mg/dL — ABNORMAL HIGH (ref 70–99)
Potassium: 3.6 mmol/L (ref 3.5–5.1)
Sodium: 141 mmol/L (ref 135–145)
Total Bilirubin: 0.6 mg/dL (ref 0.3–1.2)
Total Protein: 6.7 g/dL (ref 6.5–8.1)

## 2021-05-22 LAB — CBC WITH DIFFERENTIAL/PLATELET
Abs Immature Granulocytes: 0.03 10*3/uL (ref 0.00–0.07)
Basophils Absolute: 0.1 10*3/uL (ref 0.0–0.1)
Basophils Relative: 1 %
Eosinophils Absolute: 0.2 10*3/uL (ref 0.0–0.5)
Eosinophils Relative: 3 %
HCT: 28.6 % — ABNORMAL LOW (ref 36.0–46.0)
Hemoglobin: 8.2 g/dL — ABNORMAL LOW (ref 12.0–15.0)
Immature Granulocytes: 0 %
Lymphocytes Relative: 14 %
Lymphs Abs: 1 10*3/uL (ref 0.7–4.0)
MCH: 24.8 pg — ABNORMAL LOW (ref 26.0–34.0)
MCHC: 28.7 g/dL — ABNORMAL LOW (ref 30.0–36.0)
MCV: 86.4 fL (ref 80.0–100.0)
Monocytes Absolute: 0.5 10*3/uL (ref 0.1–1.0)
Monocytes Relative: 7 %
Neutro Abs: 5.3 10*3/uL (ref 1.7–7.7)
Neutrophils Relative %: 75 %
Platelets: 239 10*3/uL (ref 150–400)
RBC: 3.31 MIL/uL — ABNORMAL LOW (ref 3.87–5.11)
RDW: 19.1 % — ABNORMAL HIGH (ref 11.5–15.5)
WBC: 7.2 10*3/uL (ref 4.0–10.5)
nRBC: 0 % (ref 0.0–0.2)

## 2021-05-22 LAB — TROPONIN I (HIGH SENSITIVITY)
Troponin I (High Sensitivity): 35 ng/L — ABNORMAL HIGH (ref <18)
Troponin I (High Sensitivity): 39 ng/L — ABNORMAL HIGH (ref <18)

## 2021-05-22 LAB — MAGNESIUM: Magnesium: 2 mg/dL (ref 1.7–2.4)

## 2021-05-22 LAB — GLUCOSE, CAPILLARY
Glucose-Capillary: 198 mg/dL — ABNORMAL HIGH (ref 70–99)
Glucose-Capillary: 249 mg/dL — ABNORMAL HIGH (ref 70–99)
Glucose-Capillary: 304 mg/dL — ABNORMAL HIGH (ref 70–99)
Glucose-Capillary: 248 mg/dL — ABNORMAL HIGH (ref 70–99)

## 2021-05-22 LAB — MRSA NEXT GEN BY PCR, NASAL: MRSA by PCR Next Gen: DETECTED — AB

## 2021-05-22 LAB — PHOSPHORUS: Phosphorus: 3.8 mg/dL (ref 2.5–4.6)

## 2021-05-22 MED ORDER — ORAL CARE MOUTH RINSE
15.0000 mL | Freq: Two times a day (BID) | OROMUCOSAL | Status: DC
  Administered 2021-05-22 – 2021-05-25 (×6): 15 mL via OROMUCOSAL

## 2021-05-22 MED ORDER — AMIODARONE HCL IN DEXTROSE 360-4.14 MG/200ML-% IV SOLN
60.0000 mg/h | INTRAVENOUS | Status: AC
  Administered 2021-05-22: 60 mg/h via INTRAVENOUS
  Filled 2021-05-22: qty 200

## 2021-05-22 MED ORDER — LEVALBUTEROL HCL 0.63 MG/3ML IN NEBU
0.6300 mg | INHALATION_SOLUTION | Freq: Four times a day (QID) | RESPIRATORY_TRACT | Status: DC | PRN
  Administered 2021-05-22 – 2021-05-24 (×3): 0.63 mg via RESPIRATORY_TRACT
  Filled 2021-05-22 (×2): qty 3

## 2021-05-22 MED ORDER — MORPHINE SULFATE (PF) 2 MG/ML IV SOLN
1.0000 mg | INTRAVENOUS | Status: DC | PRN
  Administered 2021-05-22 – 2021-05-25 (×9): 1 mg via INTRAVENOUS
  Filled 2021-05-22 (×9): qty 1

## 2021-05-22 MED ORDER — ALPRAZOLAM 0.5 MG PO TABS
0.5000 mg | ORAL_TABLET | Freq: Three times a day (TID) | ORAL | Status: DC | PRN
  Administered 2021-05-22 – 2021-05-25 (×7): 0.5 mg via ORAL
  Filled 2021-05-22 (×7): qty 1

## 2021-05-22 MED ORDER — LEVALBUTEROL HCL 0.63 MG/3ML IN NEBU
INHALATION_SOLUTION | RESPIRATORY_TRACT | Status: AC
  Filled 2021-05-22: qty 3

## 2021-05-22 MED ORDER — FUROSEMIDE 10 MG/ML IJ SOLN
40.0000 mg | Freq: Once | INTRAMUSCULAR | Status: AC
  Administered 2021-05-22: 40 mg via INTRAVENOUS
  Filled 2021-05-22: qty 4

## 2021-05-22 MED ORDER — DILTIAZEM HCL ER 60 MG PO CP12
60.0000 mg | ORAL_CAPSULE | Freq: Two times a day (BID) | ORAL | Status: DC
Start: 2021-05-22 — End: 2021-05-24
  Administered 2021-05-22 – 2021-05-24 (×6): 60 mg via ORAL
  Filled 2021-05-22 (×6): qty 1

## 2021-05-22 MED ORDER — SIMETHICONE 80 MG PO CHEW
80.0000 mg | CHEWABLE_TABLET | Freq: Once | ORAL | Status: AC
  Administered 2021-05-22: 80 mg via ORAL
  Filled 2021-05-22: qty 1

## 2021-05-22 MED ORDER — AMIODARONE HCL IN DEXTROSE 360-4.14 MG/200ML-% IV SOLN
30.0000 mg/h | INTRAVENOUS | Status: DC
Start: 2021-05-22 — End: 2021-05-25
  Administered 2021-05-22 – 2021-05-25 (×7): 30 mg/h via INTRAVENOUS
  Filled 2021-05-22 (×10): qty 200

## 2021-05-22 MED ORDER — ZINC OXIDE 40 % EX OINT
TOPICAL_OINTMENT | Freq: Every day | CUTANEOUS | Status: DC
  Filled 2021-05-22: qty 57

## 2021-05-22 MED ORDER — HYDROCODONE-ACETAMINOPHEN 5-325 MG PO TABS
1.0000 | ORAL_TABLET | Freq: Four times a day (QID) | ORAL | Status: DC | PRN
  Administered 2021-05-22 – 2021-05-25 (×9): 1 via ORAL
  Filled 2021-05-22 (×9): qty 1

## 2021-05-22 MED ORDER — POTASSIUM CHLORIDE CRYS ER 20 MEQ PO TBCR
40.0000 meq | EXTENDED_RELEASE_TABLET | Freq: Once | ORAL | Status: AC
  Administered 2021-05-22: 40 meq via ORAL
  Filled 2021-05-22: qty 2

## 2021-05-22 MED ORDER — AMIODARONE LOAD VIA INFUSION
150.0000 mg | Freq: Once | INTRAVENOUS | Status: AC
  Administered 2021-05-22: 150 mg via INTRAVENOUS
  Filled 2021-05-22: qty 83.34

## 2021-05-22 MED ORDER — CHLORHEXIDINE GLUCONATE CLOTH 2 % EX PADS
6.0000 | MEDICATED_PAD | Freq: Every day | CUTANEOUS | Status: DC
  Administered 2021-05-22 – 2021-05-25 (×4): 6 via TOPICAL

## 2021-05-22 NOTE — Progress Notes (Signed)
RT NOTE: ? ?RT called for SOB. Pt sounds diminished in all lung fields, no wheezing present. Pt HR eleveated 120s-130s (hx of permanent Afib per RN). Pt given Xopenex breathing treatment with slight relief per pt. RT attempted to place pt on CPAP machine to help with increased WOB but pt stated she could not tolerate it and would not wear it. Pt saturations are normal. RN aware and MD paged by RN for further orders.  ?

## 2021-05-22 NOTE — TOC Initial Note (Signed)
Transition of Care (TOC) - Initial/Assessment Note  ? ? ?Patient Details  ?Name: [name] ?MRN: [id number] ?Date of Birth: Jul 12, 1945 ? ?Transition of Care [facility]) CM/SW Contact:    ?Dessa Phi, RN ?Phone Number: ?05/22/2021, 10:21 AM ? ?Clinical Narrative: confirmed from [facility] w/hospice-to return. No PT cons needed.                ? ? ?Expected Discharge Plan: [place] ?Barriers to Discharge: Continued Medical Work up ? ? ?Patient Goals and CMS Choice ?Patient states their goals for this hospitalization and ongoing recovery are:: Return back to LTC whospice ?CMS Medicare.gov Compare Post Acute Care list provided to:: Patient Represenative (must comment) (sister Melissa) ?Choice offered to / list presented to : Sibling ? ?Expected Discharge Plan and Services ?Expected Discharge Plan: [place] ?  ?Discharge Planning Services: CM Consult ?Post Acute Care Choice: Nursing Home, Hospice ?Living arrangements for the past 2 months:  (LTC) ?                ?  ?  ?  ?  ?  ?  ?  ?  ?  ?  ? ?Prior Living Arrangements/Services ?Living arrangements for the past 2 months:  (LTC) ?Lives with:: Facility Resident ?Patient language and need for interpreter reviewed:: Yes ?Do you feel safe going back to the place where you live?: Yes      ?Need for Family Participation in Patient Care: Yes (Comment) ?Care giver support system in place?: Yes (comment) ?  ?Criminal Activity/Legal Involvement Pertinent to Current Situation/Hospitalization: No - Comment as needed ? ?Activities of Daily Living ?  ?  ? ?Permission Sought/Granted ?Permission sought to share information with : Case Manager ?Permission granted to share information with : Yes, Verbal Permission Granted ? Share Information with NAME: Case manager ?   ?   ?   ? ?Emotional Assessment ?Appearance:: Appears stated age ?Attitude/Demeanor/Rapport: Gracious ?Affect (typically observed): Accepting ?Orientation: : Oriented to Self, Oriented to  Place, Oriented to  Time, Oriented to Situation ?Alcohol / Substance Use: Not Applicable ?Psych Involvement: No (comment) ? ?Admission diagnosis:  Peripheral edema [R60.9] ?Generalized weakness [R53.1] ?Ambulatory dysfunction [R26.2] ?Longstanding persistent atrial fibrillation ([place]) [I48.11] ?Stage 3a chronic kidney disease ([place]) [N18.31] ?Anemia, unspecified type [D64.9] ?On apixaban therapy [Z79.01] ?Patient Active Problem List  ? Diagnosis Date Noted  ? Generalized weakness 05/19/2021  ? 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 Chronic pain syndrome 04/06/2021  ? Atrial fibrillation, chronic ([place]) 04/06/2021  ? Multiple open wounds of lower leg 04/06/2021  ? Chronic diastolic CHF (congestive heart failure) ([place])   ? Leg wound, left 02/01/2021  ? DM2 (diabetes mellitus, type 2) ([place]) 05/03/2020  ? Chronic respiratory failure with hypoxia and hypercapnia ([place]) 05/03/2020  ? Cervical disc prolapse with radiculopathy 12/07/2019  ? History of pheochromocytoma 12/07/2019  ? 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 OSA on CPAP 12/07/2019  ? 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 Gait abnormality 06/19/2019  ? 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 TIA (transient ischemic attack) 03/22/2017  ? 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 Dizzy 12/28/2011  ? CAD (coronary artery disease), native coronary artery 12/05/2010  ? Morbid obesity ([place]) 12/05/2010  ? Essential hypertension 12/03/2010  ? Hyperlipidemia 12/03/2010  ? UI (urinary incontinence) 12/03/2010  ? ?PCP:  [name], NP ?Pharmacy:   ?[facility] [address] ?  [place] [address] ?Phone: [phone] Fax: [phone] ? ?[facility] ?[address] [place] ?[place] [address] ?Phone: [phone] Fax: [phone] ? ? ? ? ?Social Determinants of Health (SDOH)  Interventions ?  ? ?Readmission Risk Interventions ?   ? View : No data to display.  ?  ?  ?  ? ? ? ?

## 2021-05-22 NOTE — Progress Notes (Signed)
[name] [address] [facility]) hospitalized hospice patient visit ? ?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 Patient resides in long term care facility and began complaining of lack of urination or BM for the last 2-3 days as well as worsening weakness. She was transferred to [facility] ED for evaluation and hospice was notified at that time. She was admitted to the hospital on 5.4.23 with a diagnosis of weakness and UTI. Per Dr. Orpah Melter with [facility] this is a related hospice admission.  ? ?Visited with patient at bedside. She reports that she is hurting all over and is confused about everything going on and why she has gotten so bad so fast. She is very concerned with the fact that her weakness is causing issues with her moving around. She reports that she has been urinating since admission and has been given something for her bowels.  ? ?Patient is inpatient appropriate due to need for IV antibiotics ? ?Vital Signs-  97.8/115/27  105/77   spO2 90% 2L ?Intake/Output- 580/1400 ?Abnormal labs-  pH 7.55, pO2 115, Bicarb 39.1, Chloride 97, Glucose 190, Creatinine 1.02, BUN 8.6, Ca+8.6, Albumin 2.9, GFR 58, Troponin 39, Hgb 8.2, HCT 28.6, Urine with large leukocytes and large Hgb ?Diagnostics-   ?PORTABLE CHEST 1 VIEW ?COMPARISON:  Yesterday ?FINDINGS: ?Mildly degraded exam due to AP portable technique and patient body ?habitus. Numerous leads and wires project over the chest. Patient ?rotated right. Midline trachea. Cardiomegaly accentuated by AP ?portable technique. Atherosclerosis in the transverse aorta. ?Probable small layering bilateral pleural effusions. No ?pneumothorax. Interstitial edema is mild, increased. Probable ?bibasilar airspace disease, increased. ?IMPRESSION: ?Overall slightly worsened aeration with cardiomegaly and mild ?interstitial edema. ?Probable layering bilateral pleural effusions with  adjacent ?bibasilar atelectasis. ?Decreased sensitivity and specificity exam due to technique related ?factors, as described above. ? Aortic Atherosclerosis (ICD10-I70.0). ? Electronically Signed ?  By: Abigail Miyamoto M.D. ?  On: 05/22/2021 12:01 ?  ?IV/PRN Meds- Amiodarone 1.'8mg'$ /ml, 33.33m/H, Lasix '40mg'$  IV x2, MSO4 '4mg'$  x1, Rocephin 2g iv q24hours, LR/KCL 423m 50cc/H, Xanax 0.'5mg'$  po x1, Norco 5/325 po x3, MSO4 '2mg'$  x1 ?Problem List ?Generalized weakness, suspect multifactorial ?Likely contributed by chronic illness, multiple hospitalizations, hypokalemia 2.8, presumptive UTI, mild dehydration ?Treat underlying conditions ? Presumptive UTI ?UA positive for pyuria ?Started on Rocephin 2 g daily, empirically. ?Follow urine culture for ID and sensitivities to narrow down antibiotics, or can DC antibiotics if unrevealing. ? Hypokalemia ?Serum potassium 2.8 ?Repleted orally with 40 mEq x 3 doses. ?Magnesium level 1.8. ?   ?Discharge Planning- Ongoing ?Family Contact- Left message for family, patient able to speak for herself ?IDT- Updated ?Goals of care - ongoing, at this time patient wants full scope of treatment ?Should patient need ambulance transfer at discharge [facility].  ?Hospital transfer and medication list placed on patient's hard chart.  ?MiJhonnie GarnerRNTherapist, sportsBSN, WTA-C ?[facility] Surgery[facility]ison ?[phone]

## 2021-05-22 NOTE — Progress Notes (Signed)
?Progress Note ? ? ?Patient: [name] WCH:[id number] DOB: 1946/01/18 DOA: 05/22/2021     1 ?DOS: the patient was seen and examined on 05/22/2021 ?  ?Brief hospital course: ?76 y.o. female with medical history significant for severe morbid obesity, type 2 diabetes, hypertension, OSA on CPAP, chronic hypoxic respiratory failure on 3 L [place], chronic pain syndrome on opiates, chronic bilateral lower extremity wounds, chronic diastolic CHF, permanent A-fib on Eliquis, current hospice patient with [facility], who presented to [facility] ED from SNF due to generalized weakness and urinary retention for the past 2-3 days.  She has been eating and drinking without difficulty.  Has been avoiding salt intake as instructed.  No fevers.  In the ED, during her physical exam she was noted to have abdominal edema and extensive perineal area excoriation and oozing of blood from that area.  Also noted to have electrolytes abnormalities.  Patient declined Foley catheter placement.  EDP requested admission for further evaluation and management of her symptomatology.  Patient was accepted as a direct admit by Dr. Louanne Belton, [facility], hospitalist service to [facility] long hospital telemetry unit. ? ?Pt presented with UA suggesting UTI, started on abx ? ?Assessment and Plan: ?No notes have been filed under this hospital service. ?Service: Hospitalist ? ?Generalized weakness, suspect multifactorial ?Likely contributed by chronic illness, multiple hospitalizations, hypokalemia 2.8, presumptive UTI ?PT/OT consulted ?  ?Presumptive UTI ?UA positive for pyuria ?Started on Rocephin 2 g daily, empirically. ?Urine cx pending ?Currently afebrile with no fevers ?  ?Hypokalemia ?Serum potassium 2.8 ?Repleted orally with 40 mEq x 3 doses. ?Repeat BMP in the morning ?Magnesium level 1.8. ?  ?Permanent atrial fibrillation with RVR ?Resume home Eliquis for CVA prevention ?Initially resumed home p.o. diltiazem, at lower dose to avoid hypotension as BPs are soft ?HR has  remained suboptimally controlled with HR into the 130's, associated with sob ?Given soft BP, have added amiodarone gtt for better rate control ?Chart reviewed. Pt is followed by Dr. Audie Box for afib. Pt had been considered for possible DCCV down the road ?Will consult Cardiology for assistance while here ?Will give 66mq KCl to keep K around 4 ?  ?COPD with chronic hypoxia ?On 3 L nasal cannula at baseline ?Complaining of increased sob this afternoon ?Ordered and reviewed CXR, findings of slightly worsened aeration w/ cardiomegaly and mild interstitial edema with probable layering B pleural effusions ?Will give trial of IV lasix x 1 ?Recheck bmet in AM ?  ?Hyperlipidemia ?Continue home Crestor ?  ?Iron deficiency anemia ?Resume home ferrous sulfate daily ?Hgb stable at 8.2 ?No overt bleeding ?  ?CKD 3B ?Baseline creatinine appears to be 1.02 ?Avoid nephrotoxic agents and hypotension. ?Recheck bmet in AM ?  ?Severe morbid obesity ?Recommend weight loss outpatient with regular physical activity and healthy dieting. ?  ?Restless leg syndrome/chronic pain syndrome/vitamin deficiency ?Resume home regimen ?  ? ?  ? ?Subjective: Complains of feeling sob and anxious this AM. Reporting some mid-chest pain ? ?Physical Exam: ?Vitals:  ? 05/22/21 0956 05/22/21 1110 05/22/21 1220 05/22/21 1400  ?BP:  134/84 131/75 105/77  ?Pulse:  (!) 117 (!) 124 (!) 115  ?Resp:   19 (!) 27  ?Temp:   99 ?F (37.2 ?C)   ?TempSrc:   Oral   ?SpO2: 96% 97% 95% 90%  ?Weight:      ?Height:      ? ?General exam: Awake, laying in bed, in nad ?Respiratory system: Normal respiratory effort, no wheezing ?Cardiovascular system: tachycardic, s1, s2 ?Gastrointestinal system:  Soft, nondistended, positive BS ?Central nervous system: CN2-12 grossly intact, strength intact ?Extremities: Perfused, no clubbing ?Skin: Normal skin turgor, no notable skin lesions seen ?Psychiatry: Appears anxious // no visual hallucinations  ? ?Data Reviewed: ? ?Labs reviewed: K 3.6, Cr  1.02. CXR reviewed, findings of cardiomegaly with increased pulm edema ? ?Family Communication: Pt in room, family not at bedside ? ?Disposition: ?Status is: Inpatient ?Remains inpatient appropriate because: Severity of illness ? Planned Discharge Destination: Home ? ? ? ? ?Author: ?Marylu Lund, MD ?05/22/2021 3:50 PM ? ?For on call review [email or web address].  ?

## 2021-05-22 NOTE — Progress Notes (Signed)
@  0126 Dr. Hal Hope notified of pt's HR sustaining 110s-120s Afib with brief periods into the 130s-140s. Order received to give a dose of PO cardizem NOW. Will administer and continue to monitor.  ?

## 2021-05-22 NOTE — Progress Notes (Signed)
PT Cancellation Note ? ?Patient Details ?Name: [name] ?MRN: [id number] ?DOB: 22-Jan-1945 ? ? ?Cancelled Treatment:    Reason Eval/Treat Not Completed: PT screened, no acute needs identified, will sign off. Pt is long term resident of SNF and on hospice at facility.  No acute skilled PT needs. ? ? ?Galen Manila ?05/22/2021, 11:48 AM ?

## 2021-05-22 NOTE — Progress Notes (Signed)
Pt refused cpap

## 2021-05-22 NOTE — Hospital Course (Signed)
76 y.o. female with medical history significant for severe morbid obesity, type 2 diabetes, hypertension, OSA on CPAP, chronic hypoxic respiratory failure on 3 L [place], chronic pain syndrome on opiates, chronic bilateral lower extremity wounds, chronic diastolic CHF, permanent A-fib on Eliquis, current hospice patient with [facility], who presented to [facility] ED from SNF due to generalized weakness and urinary retention for the past 2-3 days.  She has been eating and drinking without difficulty.  Has been avoiding salt intake as instructed.  No fevers.  In the ED, during her physical exam she was noted to have abdominal edema and extensive perineal area excoriation and oozing of blood from that area.  Also noted to have electrolytes abnormalities.  Patient declined Foley catheter placement.  EDP requested admission for further evaluation and management of her symptomatology.  Patient was accepted as a direct admit by Dr. Louanne Belton, [facility], hospitalist service to [facility] long hospital telemetry unit. ? ?Pt presented with UA suggesting UTI, started on abx ?

## 2021-05-22 NOTE — Progress Notes (Signed)
OT Cancellation Note ? ?Patient Details ?Name: [name] ?MRN: [id number] ?DOB: 04/30/1945 ? ? ?Cancelled Treatment:    Reason Eval/Treat Not Completed: OT screened, no needs identified, will sign off. Patient is a long term care resident and will be returning to facility with Hospice. No apparent therapy needs at this time. Return to facility for 24/7 care. ? ?Lenward Chancellor ?05/22/2021, 10:34 AM ?

## 2021-05-22 NOTE — Progress Notes (Signed)
@  approx. 0035 pt endorsed pain (chronic back) and requested PRN. PRN Oxy offered to pt but declined 2/2 her endorsement of an unspecified allergy to it and her insistence that she can only take "hydrocodone". Dr. Hal Hope notified and PRN changed to hydrocodone and administered. Will continue to monitor pain. Oxycodone added to pt's allergy list.  ?

## 2021-05-22 NOTE — Consult Note (Addendum)
WOC Nurse Consult Note: ?Patient receiving care in [place] 1401. Primary RN present at time of my assessment. 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 Also used for abrasion of the hand, chafing of the skin, dermatitis due to sweating and friction, friction dermatitis, friction eczema, and genital/thigh intertrigo.  ? ?A Purewick is in use for urinary containment.  Keep the area as clean and dry as possible. ? ?The patient also had BLE unna boots secured with ace wraps, not coban. These were removed. The RLE had no open wounds. The LLE had a circumferential, wet, peeling, partial thickness wound consistent with Venous Insufficiency. ?Pressure Injury POA: Yes/No/NA ?Measurement: ?Wound bed: ?Drainage (amount, consistency, odor)  ?Periwound: ?Dressing procedure/placement/frequency: ?Wash BLE with soap and water. Pat dry. Place Xeroform gauzes over all open wound areas (the LLE at this point in time). For BLE, beginning behind the toes and going to just below the knees, spiral wrap kerlix, then 4 inch ace wrap. Perform daily. ?The ingrown right great toenail does not need intervention at this time. ?Monitor the wound area(s) for worsening of condition such as: ?Signs/symptoms of infection,  ?Increase in size,  ?Development of or worsening of odor, ?Development of pain, or increased pain at the affected locations.  Notify the medical team if any of these develop. ? ?Thank you for the consult.  Discussed plan of care with the patient and bedside nurse.  [place] nurse will not follow at this time.  Please re-consult the [place] team if needed. ? ?Val Riles, RN, MSN, CWOCN, CNS-BC, pager [phone]  ?

## 2021-05-23 ENCOUNTER — Encounter (HOSPITAL_COMMUNITY): Payer: Self-pay | Admitting: Internal Medicine

## 2021-05-23 DIAGNOSIS — D649 Anemia, unspecified: Secondary | ICD-10-CM | POA: Diagnosis not present

## 2021-05-23 DIAGNOSIS — I4819 Other persistent atrial fibrillation: Secondary | ICD-10-CM

## 2021-05-23 DIAGNOSIS — R531 Weakness: Secondary | ICD-10-CM | POA: Diagnosis not present

## 2021-05-23 DIAGNOSIS — I5032 Chronic diastolic (congestive) heart failure: Secondary | ICD-10-CM

## 2021-05-23 DIAGNOSIS — Z7901 Long term (current) use of anticoagulants: Secondary | ICD-10-CM

## 2021-05-23 LAB — GLUCOSE, CAPILLARY
Glucose-Capillary: 312 mg/dL — ABNORMAL HIGH (ref 70–99)
Glucose-Capillary: 285 mg/dL — ABNORMAL HIGH (ref 70–99)
Glucose-Capillary: 231 mg/dL — ABNORMAL HIGH (ref 70–99)
Glucose-Capillary: 282 mg/dL — ABNORMAL HIGH (ref 70–99)
Glucose-Capillary: 216 mg/dL — ABNORMAL HIGH (ref 70–99)

## 2021-05-23 LAB — COMPREHENSIVE METABOLIC PANEL
ALT: 21 U/L (ref 0–44)
AST: 25 U/L (ref 15–41)
Albumin: 3 g/dL — ABNORMAL LOW (ref 3.5–5.0)
Alkaline Phosphatase: 49 U/L (ref 38–126)
Anion gap: 12 (ref 5–15)
BUN: 29 mg/dL — ABNORMAL HIGH (ref 8–23)
CO2: 32 mmol/L (ref 22–32)
Calcium: 8.9 mg/dL (ref 8.9–10.3)
Chloride: 95 mmol/L — ABNORMAL LOW (ref 98–111)
Creatinine, Ser: 1.53 mg/dL — ABNORMAL HIGH (ref 0.44–1.00)
GFR, Estimated: 35 mL/min — ABNORMAL LOW (ref >60)
Glucose, Bld: 325 mg/dL — ABNORMAL HIGH (ref 70–99)
Potassium: 4.7 mmol/L (ref 3.5–5.1)
Sodium: 139 mmol/L (ref 135–145)
Total Bilirubin: 0.6 mg/dL (ref 0.3–1.2)
Total Protein: 7 g/dL (ref 6.5–8.1)

## 2021-05-23 LAB — CBC
HCT: 28.9 % — ABNORMAL LOW (ref 36.0–46.0)
Hemoglobin: 8 g/dL — ABNORMAL LOW (ref 12.0–15.0)
MCH: 24.1 pg — ABNORMAL LOW (ref 26.0–34.0)
MCHC: 27.7 g/dL — ABNORMAL LOW (ref 30.0–36.0)
MCV: 87 fL (ref 80.0–100.0)
Platelets: 258 10*3/uL (ref 150–400)
RBC: 3.32 MIL/uL — ABNORMAL LOW (ref 3.87–5.11)
RDW: 18.6 % — ABNORMAL HIGH (ref 11.5–15.5)
WBC: 10.9 10*3/uL — ABNORMAL HIGH (ref 4.0–10.5)
nRBC: 0 % (ref 0.0–0.2)

## 2021-05-23 LAB — URINE CULTURE

## 2021-05-23 LAB — MAGNESIUM: Magnesium: 2.1 mg/dL (ref 1.7–2.4)

## 2021-05-23 NOTE — Progress Notes (Signed)
[name] [address] [facility]) hospitalized hospice patient visit ?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 Patient resides in long term care facility and began complaining of lack of urination or BM for the last 2-3 days as well as worsening weakness. She was transferred to [facility] ED for evaluation and hospice was notified at that time. She was admitted to the hospital on 5.4.23 with a diagnosis of weakness and UTI. Per Dr. Orpah Melter with [facility] this is a related hospice admission.  ?Patient sleeping this morning. Upon awakening she reports she feels a little better in that they IV Morphine they are giving her seems to control her pain however it is making her sleepy and a little confused when she wakes.  ?Patient is inpatient appropriate due to need for IV antibiotics and IV Amiodarone.  ?Vital Signs-  98.5/107/17   123/71   spO2 95% 2L ?Intake/Output- 344/1902 ?Abnormal labs-  Chloride 95, Glucose 325, BUN 29, Creatinine 1.53, Albumin 3, GFR 35, WBC 10.9, RBC 3.32, Hgb 8, Hct 28.9 ?Diagnostics- None new  ?IV/PRN Meds- Amiodarone 1.'8mg'$ /ml, 33.42m/H, Rocephin 2g iv q24hours, LR/KCL 454m 50cc/H, Xanax 0.'5mg'$  po x2, Norco 5/325 po x3, MSO4 '1mg'$  x4 ?Problem List ?Generalized weakness, suspect multifactorial ?Likely contributed by chronic illness, multiple hospitalizations, hypokalemia 2.8, presumptive UTI ?PT/OT consulted ?  ?Presumptive UTI ?UA positive for pyuria ?Started on Rocephin 2 g daily, empirically. ?Urine cx pending ?Currently afebrile with no fevers ?  ?Hypokalemia ?Serum potassium 2.8 ?Repleted orally with 40 mEq x 3 doses. ?Repeat BMP in the morning ?Magnesium level 1.8. ?  ?Permanent atrial fibrillation with RVR ?Resume home Eliquis for CVA prevention ?Initially resumed home p.o. diltiazem, at lower dose to avoid hypotension as BPs are soft ?HR has remained suboptimally controlled with HR into the  130's, associated with sob ?Given soft BP, have added amiodarone gtt for better rate control ?Chart reviewed. Pt is followed by Dr. O'Audie Boxor afib. Pt had been considered for possible DCCV down the road ?Will consult Cardiology for assistance while here ?Will give [id number] to keep K around 4 ?    ?Discharge Planning- Ongoing ?Family Contact- Left message for family, patient able to speak for herself ?IDT- Updated ?Goals of care - Ongoing, at this time patient desires full scope of care ?Should patient need ambulance transfer at discharge [facility] for our active hosp[facility] patients.  ?MisJhonnie GarnerN,Therapist, sportsSN, WTA-C ?[facility] ?[phone]

## 2021-05-23 NOTE — Consult Note (Signed)
?Cardiology Consultation:  ? ?Patient ID: [name] ?MRN: [id number]; DOB: 02/28/1945 ? ?Admit date: 06/02/2021 ?Date of Consult: 05/23/2021 ? ?PCP:  [name], NP ?  ?[facility] Providers ?Cardiologist:  Evalina Field, MD      ? ? ?Patient Profile:  ? ?[name] is a 76 y.o. female with a hx of severe morbid obesity (BMI>50), chronic diastolic CHF, CAD with known CTO of RCA, persistent atrial fibrillation (dx 03/2021), COPD, chronic hypoxic respiratory failure on home O2, HLD, iron deficiency anemia/anemia of chronic disease, CKD stage 3b, RLS, chronic pain syndrome, chronic lower extremity edema/venous stasis with chronic wounds, DM, OSA not using CPAP due to device malfunction, pheochromocytoma s/p resection who is being seen 05/23/2021 for the evaluation of atrial fibrillation at the request of Dr. Wyline Copas. ? ?History of Present Illness:  ? ?[name] is a 76 year old female with the above history who is followed by Dr. Audie Box. Patient was referred to Dr. Audie Box in 07/2019 after moving here from [place], [place]. She has a history of CAD with MI in the 1990s s/p angioplasty. Most recent angiogram from 2015 showed CTO of RCA with left to right collateral flow. ? ?She has had multiple admissions recently ?- 08/4694 with a/c diastolic CHF. Echo was very limited but showed  LVEF >75%. Diuresed. Had episodic CP with emotional stress, hsTroponin peaked at 81, not felt to be ACS and no ischemic evaluation was recommended. ?- Readmitted 3/13-3/16/23 for acute metabolic encephalopathy felt to likely be due to polypharmacy as well as hypotension and AKI felt to be due poor oral intake requiring diuretic adjustment. Cardiology did not see during admission. ?- Readmitted 3/20-3/31/23 with new onset AF RVR in context of hypotension, UTI, AKI, multiple open wounds -> started on anticoag/rate control with plan to revisit DCCV as OP ?- At f/u 04/24/21, plan was to follow-up in 1 month to discuss DCCV -> OSA therapy also  recommended to address on follow-up ?- Readmitted 4/18-4/19/23 with ulcer of extremity; patient refused compression bandages and wound care was not hopeful these would heal without compression -> had AF RVR but was without her meds for most of the day, treated with diltiazem ? ?She presented back to [facility] ED 06/17/2021 with generalized weakness and urinary retention. In the ED she was noted to have abdominal edema and extensive perineal area excoriation and oozing of blood from that area. She also had multiple other issues identified including presumptive UTI, hypokalemia, and soft BP. She was still in atrial fibrillation and due to softer BPs, IV diltiazem stopped and started on IV amiodarone drip. She has since been placed back on oral diltiazem '60mg'$  q6hr. ? ?Current labs: K 4.7, Cr 1.02->1.53 (baseline unclear with significant variation recently from 1.1 range to 1.9, peak of 3.5), albumin 3.0, WBC 10.9, Hgb 8.0 (recently 8's), plt 258, hsTroponin 35-39. Last CXR 05/22/21 showed slightly worsened aeration with cardiomegaly and mild interstitial edema, laying bilateral pleural effusions with adjascent bilateral atx, aortic atherosclerosis. ? ? ?Past Medical History:  ?Diagnosis Date  ? Anemia of chronic disease   ? Chronic diastolic CHF (congestive heart failure) ([place])   ? Chronic kidney disease, stage 3b ([place])   ? Chronic pain   ? Coronary artery disease   ? Diabetes mellitus type 2 in obese [facility])   ? Heart failure ([place])   ? chronic  oxygen 3 liters  ? Hyperlipidemia   ? Hypertension   ? Iron deficiency anemia   ? Morbid obesity ([place])   ?  OSA (obstructive sleep apnea)   ? Persistent atrial fibrillation ([place])   ? Pheochromocytoma   ? s/p resection  ? RLS (restless legs syndrome)   ? Venous stasis   ? ? ?Past Surgical History:  ?Procedure Laterality Date  ? CARDIAC CATHETERIZATION    ? pheochromocytoma    ? TONSILLECTOMY    ?  ? ?Home Medications:  ?Prior to Admission medications   ?Medication Sig Start Date End  Date Taking? Authorizing Provider  ?acetaminophen (TYLENOL) 500 MG tablet Take 500 mg by mouth every 8 (eight) hours as needed for moderate pain or headache.   Yes [provider]  ?albuterol (VENTOLIN HFA) 108 (90 Base) MCG/ACT inhaler Inhale 2 puffs into the lungs every 6 (six) hours as needed for wheezing or shortness of breath.   Yes [provider]  ?apixaban (ELIQUIS) 5 MG TABS tablet Take 1 tablet (5 mg total) by mouth 2 (two) times daily. 04/17/21  Yes Caren Griffins, MD  ?aspirin EC 81 MG tablet Take 81 mg by mouth daily. Swallow whole.   Yes [provider]  ?Adair Patter 200-25 MCG/INH AEPB Inhale 1 puff into the lungs daily. 04/22/20  Yes [provider]  ?cetirizine (ZYRTEC) 10 MG tablet Take 10 mg by mouth daily.   Yes [provider]  ?Cholecalciferol (VITAMIN D3) 125 MCG (5000 UT) CAPS Take 5,000 Units by mouth daily.   Yes [provider]  ?ciprofloxacin (CIPRO) 500 MG tablet Take 500 mg by mouth every 12 (twelve) hours. 05/04/21  Yes [provider]  ?DEXTROMETHORPHAN HBR PO Take 5 mg by mouth See admin instructions. Every 2 hours as needed for cough suppressant/sore throat relief   Yes [provider]  ?diclofenac Sodium (VOLTAREN) 1 % GEL Apply 4 g topically in the morning, at noon, and at bedtime. Apply to bilateral knees for arthritic pain   Yes [provider]  ?diltiazem (CARDIZEM SR) 60 MG 12 hr capsule Take 1 capsule (60 mg total) by mouth every 12 (twelve) hours. 04/17/21  Yes Caren Griffins, MD  ?doxycycline (VIBRA-TABS) 100 MG tablet Take 100 mg by mouth 2 (two) times daily. 05/07/21  Yes [provider]  ?estradiol (ESTRACE) 0.1 MG/GM vaginal cream Place 1 Applicatorful vaginally daily.   Yes [provider]  ?ferrous sulfate 325 (65 FE) MG tablet Take 1 tablet (325 mg total) by mouth daily with breakfast. 02/06/21  Yes [name], MD  ?fluticasone (FLONASE) 50 MCG/ACT nasal spray  Place 1 spray into both nostrils daily.   Yes [provider]  ?glucose 4 GM chewable tablet Chew 1 tablet by mouth daily as needed for low blood sugar.   Yes [provider]  ?HYDROcodone-acetaminophen (NORCO/VICODIN) 5-325 MG tablet Take 1 tablet by mouth every 6 (six) hours as needed for moderate pain. ?Patient taking differently: Take 1 tablet by mouth every 4 (four) hours as needed for severe pain. 04/17/21  Yes Caren Griffins, MD  ?insulin NPH-regular Human (70-30) 100 UNIT/ML injection Inject 73 Units into the skin with breakfast, with lunch, and with evening meal. Hold if CBG less than 120 or if resident is going to eat less than 50 % of meal.   Yes [provider]  ?ipratropium-albuterol (DUONEB) 0.5-2.5 (3) MG/3ML SOLN Take 3 mLs by nebulization every 6 (six) hours as needed (wheezing).   Yes [provider]  ?Ixekizumab 80 MG/ML SOAJ Inject 1 Dose into the skin every 30 (thirty) days.   Yes [provider]  ?Magnesium Hydroxide (MILK OF MAGNESIA PO) Take 30 mLs by mouth daily. Every 24 hrs for constipation   Yes [provider]  ?methocarbamol (ROBAXIN) 500 MG tablet Take 500 mg by mouth every 6 (six) hours as needed for muscle spasms.   Yes [provider]  ?methylPREDNISolone (MEDROL) 4 MG tablet Take 4 mg by mouth daily. 4 mg qam and 8 mg hs per paperwork from [facility]   Yes [provider]  ?Multiple Vitamin (MULTIVITAMIN ADULT PO) Take 1 tablet by mouth daily.   Yes [provider]  ?ondansetron (ZOFRAN) 4 MG tablet Take 4 mg by mouth every 8 (eight) hours as needed for nausea or vomiting.   Yes [provider]  ?polyethylene glycol (MIRALAX / GLYCOLAX) 17 g packet Take 17 g by mouth daily as needed for mild constipation.   Yes [provider]  ?pregabalin (LYRICA) 25 MG capsule Take 1 capsule (25 mg total) by mouth 2 (two) times daily. 04/02/21  Yes Cherene Altes, MD  ?rOPINIRole (REQUIP) 0.25  MG tablet Take 0.25 mg by mouth daily as needed (RLS).   Yes [provider]  ?rosuvastatin (CRESTOR) 20 MG tablet Take 20 mg by mouth at bedtime.   Yes [provider]  ?senna-docusate [facility]

## 2021-05-23 NOTE — Progress Notes (Signed)
Palliative Care Brief Progress Note ? ?Palliative care consult received. Chart reviewed. ? ?Currently, [name] is followed by [facility] for hospice services and they are continuing to follow her in the hospital. ? ?Discussed with hospice liaison and patient clear in desire to continue current care and transition back to ALF with hospice at discharge. ? ?As goals are clear and she continues to be followed by her hospice team, will continue to only follow peripherally unless there are palliative specific needs with which we can be of assistance.  I think she will likely need time to determine her clinical course and if any acute needs from palliative care team as an inpatient. ? ?Micheline Rough, MD ?[facility] Team ?[phone] ? ?

## 2021-05-23 NOTE — Progress Notes (Signed)
Patient stated that she did not want to wear CPAP tonight. Encouraged patient to let RN know if she changes her mind ?

## 2021-05-23 NOTE — Progress Notes (Signed)
?Progress Note ? ? ?Patient: [name] ZMO:[id number] DOB: Jun 30, 1945 DOA: 06/12/2021     2 ?DOS: the patient was seen and examined on 05/23/2021 ?  ?Brief hospital course: ?76 y.o. female with medical history significant for severe morbid obesity, type 2 diabetes, hypertension, OSA on CPAP, chronic hypoxic respiratory failure on 3 L [place], chronic pain syndrome on opiates, chronic bilateral lower extremity wounds, chronic diastolic CHF, permanent A-fib on Eliquis, current hospice patient with [facility], who presented to [place] ED from SNF due to generalized weakness and urinary retention for the past 2-3 days.  She has been eating and drinking without difficulty.  Has been avoiding salt intake as instructed.  No fevers.  In the ED, during her physical exam she was noted to have abdominal edema and extensive perineal area excoriation and oozing of blood from that area.  Also noted to have electrolytes abnormalities.  Patient declined Foley catheter placement.  EDP requested admission for further evaluation and management of her symptomatology.  Patient was accepted as a direct admit by Dr. Louanne Belton, [facility], hospitalist service to [facility] long hospital telemetry unit. ? ?Pt presented with UA suggesting UTI, started on abx ? ?Assessment and Plan: ?No notes have been filed under this hospital service. ?Service: Hospitalist ? ?Generalized weakness, suspect multifactorial ?Likely contributed by chronic illness, multiple hospitalizations, hypokalemia 2.8, presumptive UTI ?PT/OT consulted, will f/u recs ?  ?Presumptive UTI ?UA positive for pyuria ?Started on Rocephin 2 g daily, continue empirically. ?Urine cx with multiple species present. Will repeat urine cx ?Currently afebrile with no fevers ?  ?Hypokalemia ?Corrected ?-cont to follow lytes and correct as needed ?  ?Permanent atrial fibrillation with RVR ?Resumed home Eliquis for CVA prevention ?Noted to be in RVR since time of presentation  ?Given soft BP, have continued on  amiodarone gtt  ?Chart reviewed. Pt is followed by Dr. Audie Box for afib. Pt had been considered for possible DCCV down the road ?Cardiology consulted, appreciate assistance  ?Cardizem '60mg'$  q12hrs added per Cardiology ?  ?COPD with chronic hypoxia ?On 3 L nasal cannula at baseline ?Complaining of increased sob this afternoon ?Ordered and reviewed CXR, findings of slightly worsened aeration w/ cardiomegaly and mild interstitial edema with probable layering B pleural effusions ?Will give trial of IV lasix x 1 ?Recheck bmet in AM ?  ?Hyperlipidemia ?Continue home Crestor ?  ?Iron deficiency anemia ?Resume home ferrous sulfate daily ?Hgb stable at 8.0 ?No overt bleeding ?  ?CKD 3B ?Baseline creatinine appears to be 1.02 ?Avoid nephrotoxic agents and hypotension. ?Cr currently 1.53 ?Recheck bmet in AM ?  ?Severe morbid obesity ?Recommend weight loss outpatient with regular physical activity and healthy dieting. ?  ?Restless leg syndrome/chronic pain syndrome/vitamin deficiency ?Resume home regimen ?  ? ?  ? ?Subjective: Reports not sleeping well, attributes to intolerance to CPAP ? ?Physical Exam: ?Vitals:  ? 05/23/21 0622 05/23/21 0826 05/23/21 1200 05/23/21 1441  ?BP:  (!) 144/125 123/71 114/77  ?Pulse:  (!) 126 (!) 104 (!) 104  ?Resp:  '15 15 18  '$ ?Temp: 98.2 ?F (36.8 ?C)  98.5 ?F (36.9 ?C) 98 ?F (36.7 ?C)  ?TempSrc: Oral  Oral Oral  ?SpO2:  92% 95% 94%  ?Weight:      ?Height:      ? ?General exam: Conversant, in no acute distress ?Respiratory system: normal chest rise, clear, no audible wheezing ?Cardiovascular system: irregularly irregular, s1-s2 ?Gastrointestinal system: Nondistended, nontender, pos BS ?Central nervous system: No seizures, no tremors ?Extremities: No cyanosis, no joint deformities ?  Skin: No rashes, no pallor ?Psychiatry: Affect normal // no auditory hallucinations  ? ?Data Reviewed: ? ?Labs reviewed: K 4.7, Cr 1.53 ? ?Family Communication: Pt in room, family not at bedside ? ?Disposition: ?Status is:  Inpatient ?Remains inpatient appropriate because: Severity of illness ? Planned Discharge Destination: Home ? ? ? ? ?Author: ?Marylu Lund, MD ?05/23/2021 3:14 PM ? ?For on call review [email or web address].  ?

## 2021-05-24 ENCOUNTER — Inpatient Hospital Stay (HOSPITAL_COMMUNITY)

## 2021-05-24 DIAGNOSIS — I5032 Chronic diastolic (congestive) heart failure: Secondary | ICD-10-CM | POA: Diagnosis not present

## 2021-05-24 DIAGNOSIS — Z7189 Other specified counseling: Secondary | ICD-10-CM

## 2021-05-24 DIAGNOSIS — R609 Edema, unspecified: Secondary | ICD-10-CM | POA: Diagnosis not present

## 2021-05-24 DIAGNOSIS — N1831 Chronic kidney disease, stage 3a: Secondary | ICD-10-CM | POA: Diagnosis not present

## 2021-05-24 DIAGNOSIS — I4819 Other persistent atrial fibrillation: Secondary | ICD-10-CM | POA: Diagnosis not present

## 2021-05-24 DIAGNOSIS — I4811 Longstanding persistent atrial fibrillation: Secondary | ICD-10-CM | POA: Diagnosis not present

## 2021-05-24 DIAGNOSIS — D649 Anemia, unspecified: Secondary | ICD-10-CM | POA: Diagnosis not present

## 2021-05-24 DIAGNOSIS — R531 Weakness: Secondary | ICD-10-CM | POA: Diagnosis not present

## 2021-05-24 LAB — CBC
HCT: 27.8 % — ABNORMAL LOW (ref 36.0–46.0)
Hemoglobin: 7.7 g/dL — ABNORMAL LOW (ref 12.0–15.0)
MCH: 24.1 pg — ABNORMAL LOW (ref 26.0–34.0)
MCHC: 27.7 g/dL — ABNORMAL LOW (ref 30.0–36.0)
MCV: 86.9 fL (ref 80.0–100.0)
Platelets: 241 10*3/uL (ref 150–400)
RBC: 3.2 MIL/uL — ABNORMAL LOW (ref 3.87–5.11)
RDW: 18.8 % — ABNORMAL HIGH (ref 11.5–15.5)
WBC: 10.7 10*3/uL — ABNORMAL HIGH (ref 4.0–10.5)
nRBC: 0.4 % — ABNORMAL HIGH (ref 0.0–0.2)

## 2021-05-24 LAB — COMPREHENSIVE METABOLIC PANEL
ALT: 28 U/L (ref 0–44)
AST: 34 U/L (ref 15–41)
Albumin: 3 g/dL — ABNORMAL LOW (ref 3.5–5.0)
Alkaline Phosphatase: 52 U/L (ref 38–126)
Anion gap: 11 (ref 5–15)
BUN: 35 mg/dL — ABNORMAL HIGH (ref 8–23)
CO2: 32 mmol/L (ref 22–32)
Calcium: 9.3 mg/dL (ref 8.9–10.3)
Chloride: 95 mmol/L — ABNORMAL LOW (ref 98–111)
Creatinine, Ser: 1.37 mg/dL — ABNORMAL HIGH (ref 0.44–1.00)
GFR, Estimated: 41 mL/min — ABNORMAL LOW (ref >60)
Glucose, Bld: 261 mg/dL — ABNORMAL HIGH (ref 70–99)
Potassium: 4 mmol/L (ref 3.5–5.1)
Sodium: 138 mmol/L (ref 135–145)
Total Bilirubin: 0.6 mg/dL (ref 0.3–1.2)
Total Protein: 6.7 g/dL (ref 6.5–8.1)

## 2021-05-24 LAB — URINE CULTURE: Culture: NO GROWTH

## 2021-05-24 LAB — BASIC METABOLIC PANEL
Anion gap: 11 (ref 5–15)
BUN: 33 mg/dL — ABNORMAL HIGH (ref 8–23)
CO2: 32 mmol/L (ref 22–32)
Calcium: 9.3 mg/dL (ref 8.9–10.3)
Chloride: 95 mmol/L — ABNORMAL LOW (ref 98–111)
Creatinine, Ser: 1.39 mg/dL — ABNORMAL HIGH (ref 0.44–1.00)
GFR, Estimated: 40 mL/min — ABNORMAL LOW (ref >60)
Glucose, Bld: 256 mg/dL — ABNORMAL HIGH (ref 70–99)
Potassium: 4 mmol/L (ref 3.5–5.1)
Sodium: 138 mmol/L (ref 135–145)

## 2021-05-24 LAB — GLUCOSE, CAPILLARY
Glucose-Capillary: 287 mg/dL — ABNORMAL HIGH (ref 70–99)
Glucose-Capillary: 328 mg/dL — ABNORMAL HIGH (ref 70–99)
Glucose-Capillary: 295 mg/dL — ABNORMAL HIGH (ref 70–99)
Glucose-Capillary: 272 mg/dL — ABNORMAL HIGH (ref 70–99)

## 2021-05-24 LAB — MAGNESIUM: Magnesium: 2.3 mg/dL (ref 1.7–2.4)

## 2021-05-24 MED ORDER — INSULIN ASPART PROT & ASPART (70-30 MIX) 100 UNIT/ML ~~LOC~~ SUSP: 25.0000 [IU] | Freq: Two times a day (BID) | SUBCUTANEOUS | Status: DC

## 2021-05-24 MED ORDER — DILTIAZEM HCL 60 MG PO TABS
60.0000 mg | ORAL_TABLET | Freq: Four times a day (QID) | ORAL | Status: DC
  Administered 2021-05-24 – 2021-05-25 (×3): 60 mg via ORAL
  Filled 2021-05-24 (×3): qty 1

## 2021-05-24 MED ORDER — DILTIAZEM HCL ER 90 MG PO CP12: 90.0000 mg | ORAL_CAPSULE | Freq: Two times a day (BID) | ORAL | Status: DC

## 2021-05-24 MED ORDER — INSULIN ASPART PROT & ASPART (70-30 MIX) 100 UNIT/ML ~~LOC~~ SUSP
25.0000 [IU] | Freq: Two times a day (BID) | SUBCUTANEOUS | Status: DC
  Filled 2021-05-24: qty 10

## 2021-05-24 MED ORDER — FUROSEMIDE 10 MG/ML IJ SOLN
80.0000 mg | Freq: Once | INTRAMUSCULAR | Status: AC
  Administered 2021-05-24: 80 mg via INTRAVENOUS
  Filled 2021-05-24: qty 8

## 2021-05-24 NOTE — Plan of Care (Signed)
?  Problem: Health Behavior/Discharge Planning: ?Goal: Ability to manage health-related needs will improve ?Outcome: Progressing ?  ?Problem: Clinical Measurements: ?Goal: Will remain free from infection ?Outcome: Progressing ?  ?Problem: Education: ?Goal: Knowledge of General Education information will improve ?Description: Including pain rating scale, medication(s)/side effects and non-pharmacologic comfort measures ?Outcome: Not Progressing ?  ?Problem: Activity: ?Goal: Risk for activity intolerance will decrease ?Outcome: Not Progressing ?  ?

## 2021-05-24 NOTE — Progress Notes (Signed)
Nurse called for RT to come and assess patient pt spo2 95-97% on 5lpm cann  HR 131 rr 21 bs diminished, patient complaining she can't breath. PRN Xopenex given ?

## 2021-05-24 NOTE — Progress Notes (Signed)
?Progress Note ? ? ?Patient: [name] QMG:[id number] DOB: 01-22-1945 DOA: 06/14/2021     3 ?DOS: the patient was seen and examined on 05/24/2021 ?  ?Brief hospital course: ?76 y.o. female with medical history significant for severe morbid obesity, type 2 diabetes, hypertension, OSA on CPAP, chronic hypoxic respiratory failure on 3 L [place], chronic pain syndrome on opiates, chronic bilateral lower extremity wounds, chronic diastolic CHF, permanent A-fib on Eliquis, current hospice patient with [facility], who presented to [place] ED from SNF due to generalized weakness and urinary retention for the past 2-3 days.  She has been eating and drinking without difficulty.  Has been avoiding salt intake as instructed.  No fevers.  In the ED, during her physical exam she was noted to have abdominal edema and extensive perineal area excoriation and oozing of blood from that area.  Also noted to have electrolytes abnormalities.  Patient declined Foley catheter placement.  EDP requested admission for further evaluation and management of her symptomatology.  Patient was accepted as a direct admit by Dr. Louanne Belton, [facility], hospitalist service to [facility] long hospital telemetry unit. ? ?Pt presented with UA suggesting UTI, started on abx ? ?Assessment and Plan: ?No notes have been filed under this hospital service. ?Service: Hospitalist ? ?Generalized weakness, suspect multifactorial ?Likely contributed by chronic illness, multiple hospitalizations, hypokalemia 2.8, presumptive UTI ?PT/OT consulted, noted to be from long term facility, thus no acute PT needs noted ?  ?Presumptive UTI ?UA positive for pyuria ?Started on Rocephin 2 g daily ?Urine cx with multiple species present. Repeat urine cx without growth ?Currently afebrile with no fevers ?Would empirically treat x 3 days ?  ?Hypokalemia ?Corrected ?-Repeat bmet in AM ?  ?Permanent atrial fibrillation with RVR ?Resumed home Eliquis for CVA prevention ?Noted to be in RVR since time of  presentation  ?Given soft BP, have continued on amiodarone gtt  ?Chart reviewed. Pt is followed by Dr. Audie Box for afib. Pt had been considered for possible DCCV down the road ?Cardiology consulted, appreciate assistance  ?Cardizem '60mg'$  q6hrs continued per Cardiology ?  ?COPD with chronic hypoxia ?On 3 L nasal cannula at baseline ?Complaining of increased sob this afternoon ?Ordered and reviewed CXR, findings of slightly worsened aeration w/ cardiomegaly and mild interstitial edema with probable layering B pleural effusions, see below ?  ?Hyperlipidemia ?Continue home Crestor ?  ?Iron deficiency anemia ?Resume home ferrous sulfate daily ?Hgb stable at 8.0 ?No overt bleeding ?  ?CKD 3B ?Baseline creatinine appears to be 1.02 ?Avoid nephrotoxic agents and hypotension. ?Cr currently 1.53 ?Recheck bmet in AM ?  ?Severe morbid obesity ?Recommend weight loss outpatient with regular physical activity and healthy dieting. ?  ?Restless leg syndrome/chronic pain syndrome/vitamin deficiency ?Resume home regimen ? ?Acute on chronic diastolic CHF with acute hypoxemic respiratory failure ?-CXR with findings suggestive of pulm edema and pleural effusion ?-BLE pitting edema on exam ?-Overnight, pt requiring up to [facility] ?-Ordered CT chest, pending official report. Per my read, B effusions noted, seems mild on L and mod on R ?-Appreciate Cardiology assistance. '80mg'$  IV lasix ordered per Cards ?-recheck bmet in AM ?  ? ?  ? ?Subjective: Complained of increased sob overnight, needing up to [facility] ? ?Physical Exam: ?Vitals:  ? 05/24/21 0829 05/24/21 0932 05/24/21 1100 05/24/21 1458  ?BP:  (!) 135/96  (!) 134/96  ?Pulse:      ?Resp:      ?Temp:   98.9 ?F (37.2 ?C)   ?TempSrc:   Oral   ?SpO2:  94%     ?Weight:      ?Height:      ? ?General exam: Awake, laying in bed, in nad ?Respiratory system: Increased respiratory effort, no wheezing ?Cardiovascular system: regular rate, s1, s2 ?Gastrointestinal system: Soft, nondistended, positive  BS ?Central nervous system: CN2-12 grossly intact, strength intact ?Extremities: Perfused, no clubbing, BLE edema ?Skin: Normal skin turgor, no notable skin lesions seen ?Psychiatry: Mood normal // no visual hallucinations  ? ?Data Reviewed: ? ?Labs reviewed: K 4.0, Cr 1.39 ? ?Family Communication: Pt in room, family at bedside ? ?Disposition: ?Status is: Inpatient ?Remains inpatient appropriate because: Severity of illness ? Planned Discharge Destination: Home ? ? ? ? ?Author: ?Marylu Lund, MD ?05/24/2021 3:16 PM ? ?For on call review [email or web address].  ?

## 2021-05-24 NOTE — Progress Notes (Signed)
Patient refusing CPAP tonight. Stated she was good on her nasal cannula but did want a neb treatment. Rt encouraged patient to call if she changed her mind and neb treatment was given.  ?

## 2021-05-24 NOTE — Progress Notes (Signed)
[name] [address] [facility]) hospitalized hospice patient visit ? ?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 [name] and began complaining of lack of urination or BM for the last 2-3 days as well as worsening weakness. She was transferred to [facility] ED for evaluation and hospice was notified at that time. She was admitted to the hospital on 5.4.23 with a diagnosis of weakness and UTI. Per Dr. Orpah Melter with [facility] this is a related hospice admission.  ? ?Patient with increasing shortness of breath overnight needing up to 5L nasal cannula oxygen. She reports that her c-pap for home is not working right and she can't tolerate the mask here.  ? ?Patient is inpatient appropriate due to need for IV antibiotics and IV Amiodarone.  ? ?Vital Signs-  98.5/107/17   123/71   spO2 95% 2L ?Intake/Output- 344/1902 ?Abnormal labs-  Chloride 95, Glucose 325, BUN 29, Creatinine 1.53, Albumin 3, GFR 35, WBC 10.9, RBC 3.32, Hgb 8, Hct 28.9 ?Diagnostics- None new  ?IV/PRN Meds- Amiodarone 1.'8mg'$ /ml, 33.56m/H, Rocephin 2g iv q24hours,  Xanax 0.'5mg'$  po x2, Norco 5/325 po x3, MSO4 '1mg'$  x2 ? ?Problem List ?Generalized weakness, suspect multifactorial ?  ?Presumptive UTI ?  ?Hypokalemia ?  ?Permanent atrial fibrillation with RVR ?    ?Discharge Planning- Ongoing ?Family Contact- Family at bedside ?IDT- Updated ?Goals of care - Ongoing, at this time patient desires full scope of care ? ?Should patient need ambulance transfer at discharge please use [facility] as they contract this service for our active hospice patients.  ?MJhonnie Garner RTherapist, sports BSN, WTA-C ?[facility] Hospital[facility]iaison ?[phone]?

## 2021-05-24 NOTE — Progress Notes (Signed)
? ?Progress Note ? ?Patient Name: [name] ?Date of Encounter: 05/24/2021 ? ?[name]: Evalina Field, MD  ? ?Subjective  ? ?Visibly short of breath, not feeling well.  ? ?Inpatient Medications  ?  ?Scheduled Meds: ? apixaban  5 mg Oral BID  ? Chlorhexidine Gluconate Cloth  6 each Topical Daily  ? diltiazem  60 mg Oral Q6H  ? ferrous sulfate  325 mg Oral Q breakfast  ? fluticasone furoate-vilanterol  1 puff Inhalation Daily  ? insulin aspart  0-20 Units Subcutaneous TID WC  ? insulin aspart  0-5 Units Subcutaneous QHS  ? liver oil-zinc oxide   Topical Daily  ? mouth rinse  15 mL Mouth Rinse BID  ? multivitamin with minerals  1 tablet Oral Daily  ? rosuvastatin  20 mg Oral QHS  ? senna-docusate  2 tablet Oral BID  ? umeclidinium bromide  1 puff Inhalation Daily  ? vitamin B-12  1,000 mcg Oral Daily  ? ?Continuous Infusions: ? amiodarone 30 mg/hr (05/23/21 2327)  ? cefTRIAXone (ROCEPHIN)  IV 2 g (05/23/21 2154)  ? ?PRN Meds: ?ALPRAZolam, HYDROcodone-acetaminophen, ipratropium-albuterol, levalbuterol, melatonin, morphine injection, ondansetron (ZOFRAN) IV, polyethylene glycol, rOPINIRole  ? ?Vital Signs  ?  ?Vitals:  ? 05/24/21 0459 05/24/21 0545 05/24/21 0829 05/24/21 0932  ?BP:    (!) 135/96  ?Pulse:      ?Resp: 18     ?Temp:      ?TempSrc:      ?SpO2:  96% 94%   ?Weight:      ?Height:      ? ? ?Intake/Output Summary (Last 24 hours) at 05/24/2021 1015 ?Last data filed at 05/24/2021 0700 ?Gross per 24 hour  ?Intake 1151.49 ml  ?Output 1001 ml  ?Net 150.49 ml  ? ? ?  05/23/2021  ?  5:00 AM 05/22/2021  ?  5:00 AM 06/01/2021  ?  8:43 PM  ?Last 3 Weights  ?Weight (lbs) 315 lb 14.7 oz 316 lb 12.8 oz 316 lb 12.8 oz  ?Weight (kg) 143.3 kg 143.7 kg 143.7 kg  ?   ? ?Telemetry  ?  ?Afib rates 130s - Personally Reviewed ? ?ECG  ?  ?No new - Personally Reviewed ? ?Physical Exam  ? ?Constitutional: dyspneic ?Eyes: sclera non-icteric, normal conjunctiva and lids ?ENMT: moist mucous membranes ?Cardiovascular: irregular  rhythm, tachycardic rate, no murmur. ?Respiratory: clear to auscultation bilaterally ?GI : normal bowel sounds, soft and nontender. No distention.   ?MSK: extremities warm, well perfused. 1+ bilateral edema, legs wrapped ?NEURO: grossly nonfocal exam, moves all extremities. ?PSYCH: alert and oriented x 3, normal mood and affect.  ? ?Labs  ?  ?High Sensitivity Troponin:   ?Recent Labs  ?Lab 05/05/21 ?2118 05/22/21 ?8366 05/22/21 ?1416  ?TROPONINIHS 61* 35* 39*  ?   ?Chemistry ?Recent Labs  ?Lab 05/22/21 ?2947 05/23/21 ?6546 05/24/21 ?5035  ?NA 141 139 138  138  ?K 3.6 4.7 4.0  4.0  ?CL 97* 95* 95*  95*  ?CO2 32 32 32  32  ?GLUCOSE 190* 325* 256*  261*  ?BUN 22 29* 33*  35*  ?CREATININE 1.02* 1.53* 1.39*  1.37*  ?CALCIUM 8.6* 8.9 9.3  9.3  ?MG 2.0 2.1 2.3  ?PROT 6.7 7.0 6.7  ?ALBUMIN 2.9* 3.0* 3.0*  ?AST 21 25 34  ?ALT '18 21 28  '$ ?ALKPHOS 43 49 52  ?BILITOT 0.6 0.6 0.6  ?GFRNONAA 58* 35* 40*  41*  ?ANIONGAP '12 12 11  11  '$ ?  ?Lipids  No results for input(s): CHOL, TRIG, HDL, LABVLDL, LDLCALC, CHOLHDL in the last 168 hours.  ?Hematology ?Recent Labs  ?Lab 05/22/21 ?7035 05/23/21 ?0093 05/24/21 ?8182  ?WBC 7.2 10.9* 10.7*  ?RBC 3.31* 3.32* 3.20*  ?HGB 8.2* 8.0* 7.7*  ?HCT 28.6* 28.9* 27.8*  ?MCV 86.4 87.0 86.9  ?MCH 24.8* 24.1* 24.1*  ?MCHC 28.7* 27.7* 27.7*  ?RDW 19.1* 18.6* 18.8*  ?PLT 239 258 241  ? ?Thyroid No results for input(s): TSH, FREET4 in the last 168 hours.  ?BNP ?Recent Labs  ?Lab 05/19/2021 ?9937  ?BNP 85.3  ?  ?DDimer No results for input(s): DDIMER in the last 168 hours.  ? ?Radiology  ?  ?DG CHEST PORT 1 VIEW ? ?Result Date: 05/22/2021 ?CLINICAL DATA:  Dyspnea.  History of diabetes.  Heart failure. EXAM: PORTABLE CHEST 1 VIEW COMPARISON:  Yesterday FINDINGS: Mildly degraded exam due to AP portable technique and patient body habitus. Numerous leads and wires project over the chest. Patient rotated right. Midline trachea. Cardiomegaly accentuated by AP portable technique. Atherosclerosis in the  transverse aorta. Probable small layering bilateral pleural effusions. No pneumothorax. Interstitial edema is mild, increased. Probable bibasilar airspace disease, increased. IMPRESSION: Overall slightly worsened aeration with cardiomegaly and mild interstitial edema. Probable layering bilateral pleural effusions with adjacent bibasilar atelectasis. Decreased sensitivity and specificity exam due to technique related factors, as described above. Aortic Atherosclerosis (ICD10-I70.0). Electronically Signed   By: Abigail Miyamoto M.D.   On: 05/22/2021 12:01   ? ?Cardiac Studies  ?Echo 3/21 ? 1. Left ventricular ejection fraction, by estimation, is 60 to 65%. The  ?left ventricle has normal function. Left ventricular endocardial border  ?not optimally defined to evaluate regional wall motion. The left  ?ventricular internal cavity size was  ?severely dilated. There is moderate asymmetric left ventricular  ?hypertrophy of the basal-septal segment. Left ventricular diastolic  ?parameters are indeterminate.  ? 2. Right ventricular systolic function was not well visualized. The right  ?ventricular size is not well visualized. Tricuspid regurgitation signal is  ?inadequate for assessing PA pressure.  ? 3. The mitral valve is grossly normal. No evidence of mitral valve  ?regurgitation.  ? 4. The aortic valve was not well visualized. Aortic valve regurgitation  ?is not visualized.  ? 5. The inferior vena cava is dilated in size with >50% respiratory  ?variability, suggesting right atrial pressure of 8 mmHg.  ? 6. Technically difficult study.  ? ?Comparison(s): No significant change from prior study. Still a very  ?limited study due to image quality.  ?Patient Profile  ?[name] is a 76 y.o. female with a hx of severe morbid obesity (BMI>50), chronic diastolic CHF, CAD with known CTO of RCA, persistent atrial fibrillation (dx 03/2021), COPD, chronic hypoxic respiratory failure on home O2, HLD, iron deficiency anemia/anemia of  chronic disease, CKD stage 3b, RLS, chronic pain syndrome, chronic lower extremity edema/venous stasis with chronic wounds, DM, OSA not using CPAP due to device malfunction, pheochromocytoma s/p resection who is being seen 05/23/2021 for the evaluation of atrial fibrillation at the request of Dr. Wyline Copas.  ? ?Assessment & Plan  ?  ? Principal Problem: ?  Generalized weakness ? ?  ?1. Generalized weakness, suspected multifactorial, with recent failure to thrive and numerous hospitalizations for various issues ?- medical problems include hypokalemia, UTI, chronic hypoxia, anemia, variable AKI on CKD, polypharmacy, encephalopathy, chronic wounds and venous stasis ?- currently with presumptive UTI with pyuria and receiving abx. This is likely driver of currently poor rate control.  ?  ?  2. Persistent atrial fib with RVR (dx 03/2021) ?- agree with addition of amiodarone for rate control, continue  ?- diltiazem added from home meds but suboptimal rate control. Will transition to diltiazem 60 mg Q6 hr and uptitrate as tolerated. BP will likely tolerate. ?- Continue apixaban.  ?-Would not currently recommend DCCV given nidus for afib is unresolved infection and pulmonary issues. May consider later in hospitalization vs as outpt pending clinical course.  ?  ?3. Chronic diastolic CHF, historically edema is not great indicator of volume status due to chronicity  ?- will give bolus dose of lasix today and monitor response, resp status worse today, I/O unimpressive despite foley catheter. Pt in agreement. Lasix 80 mg IV today and monitor, will need to be reordered tomorrow based on response. ? ?4. CAD with CTO of RCA, with chronic troponin elevation - no chest pain, troponin lower than prior admission. Continue medical therapy. She is on eliquis, would d/c aspirin. ? ?5. CKD stage 3b with variable Cr recently ?6. Severe morbid obesity with OSA, not using CPAP, also with chronic hypoxic respiratory failure - encouraged cpap, currently  refusing at night. ?   ? ?For questions or updates, please contact [place] ?Please consult [email or web address] for contact info under  ? ?  ?   ?Signed, ?Elouise Munroe, MD  ?05/24/2021, 10:15 AM   ? ?

## 2021-05-25 ENCOUNTER — Inpatient Hospital Stay (HOSPITAL_COMMUNITY)

## 2021-05-25 DIAGNOSIS — Z7189 Other specified counseling: Secondary | ICD-10-CM | POA: Diagnosis not present

## 2021-05-25 DIAGNOSIS — Z7901 Long term (current) use of anticoagulants: Secondary | ICD-10-CM | POA: Diagnosis not present

## 2021-05-25 DIAGNOSIS — J9620 Acute and chronic respiratory failure, unspecified whether with hypoxia or hypercapnia: Secondary | ICD-10-CM | POA: Diagnosis not present

## 2021-05-25 DIAGNOSIS — J9621 Acute and chronic respiratory failure with hypoxia: Secondary | ICD-10-CM

## 2021-05-25 DIAGNOSIS — I4891 Unspecified atrial fibrillation: Secondary | ICD-10-CM | POA: Diagnosis not present

## 2021-05-25 DIAGNOSIS — Z515 Encounter for palliative care: Secondary | ICD-10-CM | POA: Diagnosis not present

## 2021-05-25 DIAGNOSIS — Z66 Do not resuscitate: Secondary | ICD-10-CM

## 2021-05-25 DIAGNOSIS — R531 Weakness: Secondary | ICD-10-CM

## 2021-05-25 DIAGNOSIS — D649 Anemia, unspecified: Secondary | ICD-10-CM | POA: Diagnosis not present

## 2021-05-25 DIAGNOSIS — I4811 Longstanding persistent atrial fibrillation: Secondary | ICD-10-CM | POA: Diagnosis not present

## 2021-05-25 LAB — CBC
HCT: 25.7 % — ABNORMAL LOW (ref 36.0–46.0)
Hemoglobin: 7.4 g/dL — ABNORMAL LOW (ref 12.0–15.0)
MCH: 24.8 pg — ABNORMAL LOW (ref 26.0–34.0)
MCHC: 28.8 g/dL — ABNORMAL LOW (ref 30.0–36.0)
MCV: 86.2 fL (ref 80.0–100.0)
Platelets: 231 10*3/uL (ref 150–400)
RBC: 2.98 MIL/uL — ABNORMAL LOW (ref 3.87–5.11)
RDW: 19.3 % — ABNORMAL HIGH (ref 11.5–15.5)
WBC: 9.8 10*3/uL (ref 4.0–10.5)
nRBC: 1.1 % — ABNORMAL HIGH (ref 0.0–0.2)

## 2021-05-25 LAB — BLOOD GAS, ARTERIAL
Acid-Base Excess: 3.9 mmol/L — ABNORMAL HIGH (ref 0.0–2.0)
Bicarbonate: 31.3 mmol/L — ABNORMAL HIGH (ref 20.0–28.0)
O2 Saturation: 92.6 %
Patient temperature: 36.8
pCO2 arterial: 57 mmHg — ABNORMAL HIGH (ref 32–48)
pH, Arterial: 7.34 — ABNORMAL LOW (ref 7.35–7.45)
pO2, Arterial: 71 mmHg — ABNORMAL LOW (ref 83–108)

## 2021-05-25 LAB — COMPREHENSIVE METABOLIC PANEL
ALT: 61 U/L — ABNORMAL HIGH (ref 0–44)
AST: 66 U/L — ABNORMAL HIGH (ref 15–41)
Albumin: 3.1 g/dL — ABNORMAL LOW (ref 3.5–5.0)
Alkaline Phosphatase: 57 U/L (ref 38–126)
Anion gap: 11 (ref 5–15)
BUN: 40 mg/dL — ABNORMAL HIGH (ref 8–23)
CO2: 33 mmol/L — ABNORMAL HIGH (ref 22–32)
Calcium: 9.2 mg/dL (ref 8.9–10.3)
Chloride: 93 mmol/L — ABNORMAL LOW (ref 98–111)
Creatinine, Ser: 1.56 mg/dL — ABNORMAL HIGH (ref 0.44–1.00)
GFR, Estimated: 35 mL/min — ABNORMAL LOW (ref >60)
Glucose, Bld: 287 mg/dL — ABNORMAL HIGH (ref 70–99)
Potassium: 3.5 mmol/L (ref 3.5–5.1)
Sodium: 137 mmol/L (ref 135–145)
Total Bilirubin: 0.8 mg/dL (ref 0.3–1.2)
Total Protein: 6.7 g/dL (ref 6.5–8.1)

## 2021-05-25 LAB — APTT: aPTT: 36 seconds (ref 24–36)

## 2021-05-25 LAB — HEPARIN LEVEL (UNFRACTIONATED): Heparin Unfractionated: >1.1 IU/mL — ABNORMAL HIGH (ref 0.30–0.70)

## 2021-05-25 LAB — GLUCOSE, CAPILLARY: Glucose-Capillary: 364 mg/dL — ABNORMAL HIGH (ref 70–99)

## 2021-05-25 MED ORDER — MORPHINE SULFATE (PF) 2 MG/ML IV SOLN
2.0000 mg | INTRAVENOUS | Status: DC | PRN
  Administered 2021-05-25: 2 mg via INTRAVENOUS
  Filled 2021-05-25: qty 1

## 2021-05-25 MED ORDER — HYDROMORPHONE HCL 1 MG/ML IJ SOLN
1.0000 mg | INTRAMUSCULAR | Status: DC | PRN
  Administered 2021-05-25: 1 mg via INTRAVENOUS
  Filled 2021-05-25: qty 1

## 2021-05-25 MED ORDER — MORPHINE SULFATE (PF) 2 MG/ML IV SOLN: 2.0000 mg | INTRAVENOUS | Status: DC | PRN

## 2021-05-25 MED ORDER — HALOPERIDOL LACTATE 5 MG/ML IJ SOLN
2.0000 mg | Freq: Once | INTRAMUSCULAR | Status: AC
  Administered 2021-05-25: 2 mg via INTRAVENOUS
  Filled 2021-05-25: qty 1

## 2021-05-25 MED ORDER — LORAZEPAM 2 MG/ML IJ SOLN
1.0000 mg | INTRAMUSCULAR | Status: DC | PRN
  Administered 2021-05-25: 1 mg via INTRAVENOUS
  Filled 2021-05-25: qty 1

## 2021-05-25 MED ORDER — AMIODARONE IV BOLUS ONLY 150 MG/100ML
150.0000 mg | Freq: Once | INTRAVENOUS | Status: AC
  Administered 2021-05-25: 150 mg via INTRAVENOUS

## 2021-05-25 MED ORDER — HEPARIN (PORCINE) 25000 UT/250ML-% IV SOLN: 1350.0000 [IU]/h | INTRAVENOUS | Status: DC

## 2021-05-25 MED ORDER — GLYCOPYRROLATE 1 MG PO TABS: 1.0000 mg | ORAL_TABLET | ORAL | Status: DC | PRN

## 2021-05-25 MED ORDER — MUPIROCIN 2 % EX OINT: 1.0000 "application " | TOPICAL_OINTMENT | Freq: Two times a day (BID) | CUTANEOUS | Status: DC

## 2021-05-25 MED ORDER — POLYVINYL ALCOHOL 1.4 % OP SOLN
1.0000 [drp] | Freq: Four times a day (QID) | OPHTHALMIC | Status: DC | PRN
  Filled 2021-05-25: qty 15

## 2021-05-25 MED ORDER — FUROSEMIDE 10 MG/ML IJ SOLN
80.0000 mg | Freq: Two times a day (BID) | INTRAMUSCULAR | Status: DC
Start: 2021-05-25 — End: 2021-05-26
  Administered 2021-05-25: 80 mg via INTRAVENOUS
  Filled 2021-05-25 (×2): qty 8

## 2021-05-25 MED ORDER — GLYCOPYRROLATE 0.2 MG/ML IJ SOLN: 0.2000 mg | INTRAMUSCULAR | Status: DC | PRN

## 2021-05-25 MED ORDER — BIOTENE DRY MOUTH MT LIQD: 15.0000 mL | OROMUCOSAL | Status: DC | PRN

## 2021-05-25 MED ORDER — FUROSEMIDE 10 MG/ML IJ SOLN
40.0000 mg | Freq: Once | INTRAMUSCULAR | Status: AC
  Administered 2021-05-25: 40 mg via INTRAVENOUS
  Filled 2021-05-25: qty 4

## 2021-05-25 MED ORDER — HYDROMORPHONE HCL 1 MG/ML IJ SOLN
INTRAMUSCULAR | Status: AC
  Filled 2021-05-25: qty 1

## 2021-05-25 MED ORDER — CHLORHEXIDINE GLUCONATE 0.12 % MT SOLN: 15.0000 mL | Freq: Two times a day (BID) | OROMUCOSAL | Status: DC

## 2021-05-25 MED ORDER — LORAZEPAM 2 MG/ML IJ SOLN: 1.0000 mg | INTRAMUSCULAR | Status: DC | PRN

## 2021-05-25 MED ORDER — DIGOXIN 0.25 MG/ML IJ SOLN: 0.2500 mg | Freq: Every day | INTRAMUSCULAR | Status: DC

## 2021-05-25 MED ORDER — LORAZEPAM 2 MG/ML IJ SOLN
0.5000 mg | Freq: Once | INTRAMUSCULAR | Status: AC
  Administered 2021-05-25: 0.5 mg via INTRAVENOUS
  Filled 2021-05-25: qty 1

## 2021-05-25 MED ORDER — FUROSEMIDE 10 MG/ML IJ SOLN
40.0000 mg | Freq: Once | INTRAMUSCULAR | Status: AC
Start: 2021-05-25 — End: 2021-05-25
  Administered 2021-05-25: 40 mg via INTRAVENOUS
  Filled 2021-05-25: qty 4

## 2021-05-25 MED ORDER — HYDROMORPHONE HCL 1 MG/ML IJ SOLN
1.0000 mg | INTRAMUSCULAR | Status: AC
  Administered 2021-05-25: 1 mg via INTRAVENOUS

## 2021-05-25 MED ORDER — ORAL CARE MOUTH RINSE: 15.0000 mL | Freq: Two times a day (BID) | OROMUCOSAL | Status: DC

## 2021-06-11 ENCOUNTER — Ambulatory Visit: Payer: Medicare Other | Admitting: Cardiovascular Disease

## 2021-06-18 NOTE — Consult Note (Signed)
? ?                                                                                ?Consultation Note ?Date: 2021/06/07  ? ?Patient Name: [name]  ?DOB: [birth year]  MRN: [id number]  Age / Sex: 76 y.o., female  ?PCP: [name], NP ?Referring Physician: Donne Hazel, MD ? ?Reason for Consultation: Establishing goals of care ? ?HPI/Patient Profile: 76 y.o. female  with past medical history of obesity, diabetes, hypertension, OSA, chronic hypoxic respiratory failure, chronic pain, chronic bilateral extremity wounds, diastolic heart failure, permanent A-fib on Eliquis who is followed by [facility] for home hospice admitted on 05/23/2021 with weakness, presumptive UTI, A-fib and COPD.  Palliative consulted for goals of care. ? ?Clinical Assessment and Goals of Care: ?Palliative care consult received.  Chart reviewed including personal review of pertinent labs and imaging. ? ?I met today with [name].  At time of my encounter she was sitting in her bed eating from her dinner tray.  Over the course of my encounter she finished a small container of peaches but did not eat any more for dinner and said she was "just done with this."  I asked what she meant by this she said, "I just do not know." ? ?She tells me that her family is the most important thing to her moving forward and when I asked what is her hope for the future she said, "for my family not to suffer."  She did not clarify further when asked what she meant by this. ? ?She asked me the difference between hospice and palliative care and I talked about this with her this evening.  She again stated "I just do not know." ? ?I asked her who helped her to process medical information to make decisions and she states her son.  I asked about calling him and she told me that he was just here today to visit with her and had driven home and she did not want me to bother him tonight.  I told her that my experience has been that he would not find it a  bother to get an update, but she again asked me not to call him.  " He is very busy and has to work tomorrow."   ? ?She then asked me to help relay back her head some and stated she was not feeling well and wanted to rest.  I asked her about setting up a time tomorrow for further discussion and she said that family would be busy tomorrow but I am welcome to stop by and check in on her again. ? ? ?SUMMARY OF RECOMMENDATIONS   ?-Full code/full scope ?-Continue conversation and building rapport with [name].  She asked me today not to call family as they had been to visit her earlier and she does not want to talk further about care plan this evening.  I told her that I thought it was important to have family be part of conversation and she stated, "maybe tomorrow." ?-Palliative care to continue to follow and progress conversation based upon her clinical course. ? ?Code Status/Advance Care Planning: ?Full code ?Prognosis:  ?Guarded ? ?  Discharge Planning: Already on service with [place] with Hospice  ? ?  ? ?Primary Diagnoses: ?Present on Admission: ?**None** ? ? ?I have reviewed the medical record, interviewed the patient and family, and examined the patient. The following aspects are pertinent. ? ?Past Medical History:  ?Diagnosis Date  ? Anemia of chronic disease   ? Chronic diastolic CHF (congestive heart failure) ([name])   ? Chronic kidney disease, stage 3b ([place])   ? Chronic pain   ? Coronary artery disease   ? Diabetes mellitus type 2 in obese [name])   ? Heart failure ([name])   ? chronic  oxygen 3 liters  ? Hyperlipidemia   ? Hypertension   ? Iron deficiency anemia   ? Morbid obesity ([name])   ? OSA (obstructive sleep apnea)   ? Persistent atrial fibrillation ([name])   ? Pheochromocytoma   ? s/p resection  ? RLS (restless legs syndrome)   ? Venous stasis   ? ?Social History  ? ?Socioeconomic History  ? Marital status: Single  ?  Spouse name: Not on file  ? Number of children: Not on file  ? Years of  education: Not on file  ? Highest education level: Not on file  ?Occupational History  ? Occupation: retired  ?Tobacco Use  ? Smoking status: Former  ?  Packs/day: 2.00  ?  Years: 10.00  ?  Pack years: 20.00  ?  Types: Cigarettes  ?  Quit date: 08/15/1989  ?  Years since quitting: 31.7  ? Smokeless tobacco: Never  ?Substance and Sexual Activity  ? Alcohol use: Not Currently  ? Drug use: Not Currently  ? Sexual activity: Not on file  ?Other Topics Concern  ? Not on file  ?Social History Narrative  ? Not on file  ? ?Social Determinants of Health  ? ?Financial Resource Strain: Not on file  ?Food Insecurity: Not on file  ?Transportation Needs: Not on file  ?Physical Activity: Not on file  ?Stress: Not on file  ?Social Connections: Not on file  ? ?Family History  ?Problem Relation Age of Onset  ? Heart attack Mother   ? Stroke Mother   ? Heart attack Father   ? ?Scheduled Meds: ? chlorhexidine  15 mL Mouth Rinse BID  ? Chlorhexidine Gluconate Cloth  6 each Topical Daily  ? digoxin  0.25 mg Intravenous Daily  ? diltiazem  60 mg Oral Q6H  ? ferrous sulfate  325 mg Oral Q breakfast  ? fluticasone furoate-vilanterol  1 puff Inhalation Daily  ? furosemide  80 mg Intravenous BID  ? insulin aspart  0-20 Units Subcutaneous TID WC  ? insulin aspart  0-5 Units Subcutaneous QHS  ? insulin aspart protamine- aspart  25 Units Subcutaneous BID WC  ? liver oil-zinc oxide   Topical Daily  ? mouth rinse  15 mL Mouth Rinse BID  ? mouth rinse  15 mL Mouth Rinse q12n4p  ? multivitamin with minerals  1 tablet Oral Daily  ? rosuvastatin  20 mg Oral QHS  ? senna-docusate  2 tablet Oral BID  ? umeclidinium bromide  1 puff Inhalation Daily  ? vitamin B-12  1,000 mcg Oral Daily  ? ?Continuous Infusions: ? amiodarone 30 mg/hr (05-27-2021 0800)  ? cefTRIAXone (ROCEPHIN)  IV 200 mL/hr at 05-27-2021 0800  ? heparin    ? ?PRN Meds:.ALPRAZolam, HYDROcodone-acetaminophen, ipratropium-albuterol, levalbuterol, melatonin, morphine injection, ondansetron (ZOFRAN)  IV, polyethylene glycol, rOPINIRole ?Medications Prior to Admission:  ?Prior to Admission medications   ?  Medication Sig Start Date End Date Taking? Authorizing Provider  ?acetaminophen (TYLENOL) 500 MG tablet Take 500 mg by mouth every 8 (eight) hours as needed for moderate pain or headache.   Yes [provider]  ?albuterol (VENTOLIN HFA) 108 (90 Base) MCG/ACT inhaler Inhale 2 puffs into the lungs every 6 (six) hours as needed for wheezing or shortness of breath.   Yes [provider]  ?apixaban (ELIQUIS) 5 MG TABS tablet Take 1 tablet (5 mg total) by mouth 2 (two) times daily. 04/17/21  Yes Caren Griffins, MD  ?aspirin EC 81 MG tablet Take 81 mg by mouth daily. Swallow whole.   Yes [provider]  ?[place] Patter 200-25 MCG/INH AEPB Inhale 1 puff into the lungs daily. 04/22/20  Yes [provider]  ?cetirizine (ZYRTEC) 10 MG tablet Take 10 mg by mouth daily.   Yes [provider]  ?Cholecalciferol (VITAMIN D3) 125 MCG (5000 UT) CAPS Take 5,000 Units by mouth daily.   Yes [provider]  ?ciprofloxacin (CIPRO) 500 MG tablet Take 500 mg by mouth every 12 (twelve) hours. 05/04/21  Yes [provider]  ?DEXTROMETHORPHAN HBR PO Take 5 mg by mouth See admin instructions. Every 2 hours as needed for cough suppressant/sore throat relief   Yes [provider]  ?diclofenac Sodium (VOLTAREN) 1 % GEL Apply 4 g topically in the morning, at noon, and at bedtime. Apply to bilateral knees for arthritic pain   Yes [provider]  ?diltiazem (CARDIZEM SR) 60 MG 12 hr capsule Take 1 capsule (60 mg total) by mouth every 12 (twelve) hours. 04/17/21  Yes Caren Griffins, MD  ?doxycycline (VIBRA-TABS) 100 MG tablet Take 100 mg by mouth 2 (two) times daily. 05/07/21  Yes [provider]  ?estradiol (ESTRACE) 0.1 MG/GM vaginal cream Place 1 Applicatorful vaginally daily.   Yes [provider]  ?ferrous sulfate 325 (65 FE) MG tablet Take 1  tablet (325 mg total) by mouth daily with breakfast. 02/06/21  Yes [name], MD  ?fluticasone (FLONASE) 50 MCG/ACT nasal spray Place 1 spray into both nostrils daily.   Yes Provider, Historical, M

## 2021-06-18 NOTE — Progress Notes (Addendum)
Progress Note   Patient: [name] ZOX:[id number] DOB: [birth year] DOA: 06/17/2021     4 DOS: the patient was seen and examined on 06/17/2021   Note: Patient was seen and examined in the early morning, note was written later in day after time of death  Brief hospital course: 76 y.o. female with medical history significant for severe morbid obesity, type 2 diabetes, hypertension, OSA on CPAP, chronic hypoxic respiratory failure on 3 L [place], chronic pain syndrome on opiates, chronic bilateral lower extremity wounds, chronic diastolic CHF, permanent A-fib on Eliquis, current hospice patient with [facility], who presented to [place] ED from SNF due to generalized weakness and urinary retention for the past 2-3 days.  She has been eating and drinking without difficulty.  Has been avoiding salt intake as instructed.  No fevers.  In the ED, during her physical exam she was noted to have abdominal edema and extensive perineal area excoriation and oozing of blood from that area.  Also noted to have electrolytes abnormalities.  Patient declined Foley catheter placement.  EDP requested admission for further evaluation and management of her symptomatology.  Patient was accepted as a direct admit by Dr. Louanne Belton, [facility], hospitalist service to [facility] long hospital telemetry unit.  Pt presented with UA suggesting UTI, started on abx  Assessment and Plan: No notes have been filed under this hospital service. Service: Hospitalist  Generalized weakness, suspect multifactorial Likely contributed by chronic illness, multiple hospitalizations, hypokalemia 2.8, presumptive UTI Pt was given course of empiric abx   Presumptive UTI UA positive for pyuria Given course of Rocephin 2 g daily Urine cx with multiple species present. Repeat urine cx without growth   Hypokalemia Corrected Later transitioned to comfort measures only   Permanent atrial fibrillation with RVR Resumed home Eliquis for CVA prevention Noted to be in  RVR since time of presentation  Given soft BP, was later continued on amiodarone gtt  Patient remained in RVR despite assistance by Cardiology With worsening condition, pt was transitioned to comfort measures only   COPD with chronic hypoxia On 3 L nasal cannula at baseline Patient with progressively worsening hypoxemia, ultimately requiring BIPAP support Multiple chest imaging including CT demonstrated pulm edema with vol overload Given lasix this visit   Hyperlipidemia Patient was continued on crestor Later transitioned to comfort measures only   Iron deficiency anemia Was continued on home ferrous sulfate daily Patient later transitioned to comfort measures only   Acute on CKD 3B Baseline creatinine appears to be 1.02 During this visit, Cr progressively worsened to peak of 1.56 Later transitioned to comfort status only   Severe morbid obesity Had remained stable   Restless leg syndrome/chronic pain syndrome/vitamin deficiency Resumed home regimen  Acute on chronic diastolic CHF with acute hypoxemic respiratory failure -CXR with findings suggestive of pulm edema and pleural effusion -BLE pitting edema on exam -With worsening hypoxemic failure, ordered CT chest with findings of cardiomegaly and pulm edema  DNR, end of life -despite multiple doses of IV lasix and assistance by Cardiology, patient o2 requirements progressively worsened -Palliative Care was consulted and after discussion with family, pt's wishes were confirmed DNR/DNI.  -With grim prognosis, pt was later transitioned to comfort care only with anticipation for hospital death       Subjective: Unable to provide as pt is on bipap  Physical Exam: Vitals:   Jun 17, 2021 1200 06/17/21 1300 06-17-2021 1400 06/17/2021 1755  BP: (!) 127/51 (!) 120/58 (!) 119/55   Pulse: (!) 150 (!) 115 Marland Kitchen)  122   Resp: '17 10 10 '$ (!) 0  Temp: 98.7 F (37.1 C)     TempSrc: Axillary     SpO2: 100% (!) 87% 97%   Weight:      Height:        General exam: Not conversant, in no acute distress, bipap in place Respiratory system: increased resp effort, no audible wheezing Cardiovascular system: regular rhythm, s1-s2 Gastrointestinal system: Nondistended, nontender, pos BS Central nervous system: No seizures, no tremors Extremities: No cyanosis, no joint deformities Skin: No rashes, no pallor Psychiatry: unable to assess given mentation  Data Reviewed:  Labs reviewed: K 3.5, Cr 1.56  Family Communication: Pt in room, family currently not at bedside this AM  Disposition: Status is: Inpatient Remains inpatient appropriate because: Severity of illness  Planned Discharge Destination:  hospital death, comfort care     Author: Marylu Lund, MD 06-04-21 6:18 PM  For on call review [email or web address].

## 2021-06-18 NOTE — Consult Note (Signed)
? ?NAME:  Wilfred Siverson, MRN:  [id number], DOB:  12-Mar-1945, LOS: 4 ?ADMISSION DATE:  05/24/2021, CONSULTATION DATE:  Jun 04, 2021 ?REFERRING MD:  Wyline Copas, CHIEF COMPLAINT:   acute dyspnea ? ?History of Present Illness:  ?Breasia Karges is a69 year old female with morbid obesity, T2DM, OSA on CPAP, HTN, chronic respiratory failure on 3L, chronic pain and lower extremity wounds, HFpEF, atrial fibrillation on Eliquis who resides as SNF and initially presented to the ED on 5/4 secondary to several days of weakness and urinary retention.  She was admitted with presumed UTI and hypokalemia along with likely acute CHF exacerbation.  She was treated with empiric Rocephin and started on amiodarone for RVR.   On 5/8 she developed worsening shortness of breath and was given additional Lasix 29m, ABG showed pH 7.34/57/71 on 10L.  She was started on Bipap and transferred to stepdown. ? ?After transfer to SDU, she was agitated and on NRB. Unable to tolerate BiPAP Did not respond to Haldo 2 mg. PCCM consulted for assistance. ? ?Pertinent  Medical History  ? has a past medical history of Anemia of chronic disease, Chronic diastolic CHF (congestive heart failure) ([place], Chronic kidney disease, stage 3b ([place], Chronic pain, Coronary artery disease, Diabetes mellitus type 2 in obese ([place], Heart failure ([place], Hyperlipidemia, Hypertension, Iron deficiency anemia, Morbid obesity ([place], OSA (obstructive sleep apnea), Persistent atrial fibrillation ([place], Pheochromocytoma, RLS (restless legs syndrome), and Venous stasis. ? ? ?Significant Hospital Events: ?Including procedures, antibiotic start and stop dates in addition to other pertinent events   ?5/8 worsening shortness of breath, placed on Bipap, PCCM consulted, [place]  ? ?Interim History / Subjective:  ?Stable, agitated requiring wrist restraints so Bipap on hold, stable respiratory status currently.  CXR with stable edema and bibasilar opacities  ? ?Objective   ?Blood pressure 139/63,  pulse (!) 114, temperature 98.9 ?F (37.2 ?C), temperature source Axillary, resp. rate 18, height 5' 3"  (1.6 m), weight (!) 141.3 kg, SpO2 100 %. ?   ?FiO2 (%):  [40 %] 40 %  ? ?Intake/Output Summary (Last 24 hours) at [id number] ?Last data filed at [id number] ?Gross per 24 hour  ?Intake 525.49 ml  ?Output 1950 ml  ?Net -1424.51 ml  ? ?Filed Weights  ? 05/22/21 0500 05/23/21 0500 [id number] ?Weight: (!) 143.7 kg (!) 143.3 kg (!) 141.3 kg  ? ?Physical Exam: ?General: Chronically ill-appearing, morbidly obese, agitated ?HENT: [place], AT, OP clear, MMM, NRB ?Eyes: EOMI, no scleral icterus ?Respiratory: Diminished breath sounds with basilar crackles ?Cardiovascular: RRR, -M/R/G, no JVD ?GI: BS+, soft, nontender ?Extremities:-Edema,-tenderness ?Neuro: AAO x2, CNII-XII grossly intact ? ?CXR 5May 18, 2023- Bilateral pleural effusions/atelectasis ?Resolved Hospital Problem list   ? ? ?Assessment & Plan:  ? ?Acute Hypoxic and Hypercarbic Respiratory Failure  ?Acute CHF exacerbation ?AFRVR - driven by above ?Acute encephalopathy secondary to above ?-Pt received Lasix and placed on Bipap  ?-Unable to tolerate BiPAP due to agitation. Given ativan 0.5 mg with improvement. PRNs ordered ?-Palliative team met with patient and code status changed to DNR/DNI. Per consult team, may transition to comfort when family arrives ?-Pulmonary available as needed ? ?Best Practice (right click and "Reselect all SmartList Selections" daily)  ? ?Per primary ? ?Labs   ?CBC: ?Recent Labs  ?Lab 06/07/2021 ?0924 05/26/2021 ?1250 05/22/21 ?0664405/06/23 ?0034705/07/23 ?[id number]?0427  ?WBC 7.5  --  7.2 10.9* 10.7* 9.8  ?NEUTROABS 6.0  --  5.3  --   --   --   ?  HGB 8.1* 8.8* 8.2* 8.0* 7.7* 7.4*  ?HCT 29.0* 26.0* 28.6* 28.9* 27.8* 25.7*  ?MCV 84.1  --  86.4 87.0 86.9 86.2  ?PLT 259  --  239 258 241 231  ? ? ?Basic Metabolic Panel: ?Recent Labs  ?Lab 06/17/2021 ?0924 06/01/2021 ?1250 05/22/21 ?0102 05/23/21 ?7253 05/24/21 ?6644 06-23-21 ?0427  ?NA 143 140 141  139 138  138 137  ?K 3.0* 2.8* 3.6 4.7 4.0  4.0 3.5  ?CL 96*  --  97* 95* 95*  95* 93*  ?CO2 37*  --  32 32 32  32 33*  ?GLUCOSE 149*  --  190* 325* 256*  261* 287*  ?BUN 26*  --  22 29* 33*  35* 40*  ?CREATININE 1.12*  --  1.02* 1.53* 1.39*  1.37* 1.56*  ?CALCIUM 9.1  --  8.6* 8.9 9.3  9.3 9.2  ?MG 1.8  --  2.0 2.1 2.3  --   ?PHOS  --   --  3.8  --   --   --   ? ?GFR: ?Estimated Creatinine Clearance: 44 mL/min (A) (by C-G formula based on SCr of 1.56 mg/dL (H)). ?Recent Labs  ?Lab 05/22/21 ?0347 05/23/21 ?4259 05/24/21 ?5638 06/23/2021 ?0427  ?WBC 7.2 10.9* 10.7* 9.8  ? ? ?Liver Function Tests: ?Recent Labs  ?Lab 05/20/2021 ?0924 05/22/21 ?7564 05/23/21 ?3329 05/24/21 ?5188 06/23/2021 ?0427  ?AST 18 21 25  34 66*  ?ALT 14 18 21 28  61*  ?ALKPHOS 41 66 06 30 16  ?BILITOT 0.4 0.6 0.6 0.6 0.8  ?PROT 6.6 6.7 7.0 6.7 6.7  ?ALBUMIN 3.3* 2.9* 3.0* 3.0* 3.1*  ? ?No results for input(s): LIPASE, AMYLASE in the last 168 hours. ?No results for input(s): AMMONIA in the last 168 hours. ? ?ABG ?   ?Component Value Date/Time  ? PHART 7.34 (L) 06/23/2021 0554  ? PCO2ART 57 (H) 06/23/2021 0554  ? PO2ART 71 (L) 2021-06-23 0554  ? HCO3 31.3 (H) 2021-06-23 0554  ? TCO2 40 (H) 06/10/2021 1250  ? O2SAT 92.6 23-Jun-2021 0554  ?  ? ?Coagulation Profile: ?No results for input(s): INR, PROTIME in the last 168 hours. ? ?Cardiac Enzymes: ?No results for input(s): CKTOTAL, CKMB, CKMBINDEX, TROPONINI in the last 168 hours. ? ?HbA1C: ?Hgb A1c MFr Bld  ?Date/Time Value Ref Range Status  ?04/07/2021 12:06 AM 6.3 (H) 4.8 - 5.6 % Final  ?  Comment:  ?  (NOTE) ?Pre diabetes:          5.7%-6.4% ? ?Diabetes:              >6.4% ? ?Glycemic control for   <7.0% ?adults with diabetes ?  ?02/01/2021 03:25 AM 7.2 (H) 4.8 - 5.6 % Final  ?  Comment:  ?  (NOTE) ?Pre diabetes:          5.7%-6.4% ? ?Diabetes:              >6.4% ? ?Glycemic control for   <7.0% ?adults with diabetes ?  ? ? ?CBG: ?Recent Labs  ?Lab 05/24/21 ?0109 05/24/21 ?1218 05/24/21 ?3235  05/24/21 ?2110 2021/06/23 ?0814  ?GLUCAP 287* 328* 295* 272* 364*  ? ? ?Review of Systems:   ?Unable to obtain secondary to Bipap and MS ? ?Past Medical History:  ?She,  has a past medical history of Anemia of chronic disease, Chronic diastolic CHF (congestive heart failure) ([place]), Chronic kidney disease, stage 3b ([place]), Chronic pain, Coronary artery disease, Diabetes mellitus type 2 in obese ([place]), Heart failure ([place]), Hyperlipidemia,  Hypertension, Iron deficiency anemia, Morbid obesity ([place]), OSA (obstructive sleep apnea), Persistent atrial fibrillation ([place]), Pheochromocytoma, RLS (restless legs syndrome), and Venous stasis.  ? ?Surgical History:  ? ?Past Surgical History:  ?Procedure Laterality Date  ? CARDIAC CATHETERIZATION    ? pheochromocytoma    ? TONSILLECTOMY    ?  ? ?Social History:  ? reports that she quit smoking about 31 years ago. Her smoking use included cigarettes. She has a 20.00 pack-year smoking history. She has never used smokeless tobacco. She reports that she does not currently use alcohol. She reports that she does not currently use drugs.  ? ?Family History:  ?Her family history includes Heart attack in her father and mother; Stroke in her mother.  ? ?Allergies ?Allergies  ?Allergen Reactions  ? Metaxalone Anaphylaxis  ? Nitroglycerin   ?  BP bottoms out  ? Tramadol-Acetaminophen Anaphylaxis  ? Icosapent Ethyl Other (See Comments)  ?  Stomach pain, gas, diarrhea, headache from Vascepa  ? Brompheniramine-Pseudoeph   ?  Feels drunk  ? Meprobamate Other (See Comments)  ?  unknown  ? Metformin Nausea Only  ?  "flushes"  ? Methyldopa   ?  Other reaction(s): Unknown  ? Metoprolol   ?  Other reaction(s): Unknown  ? Oxycodone   ?  Pt affirms allergy but can't remember what her reaction is  ? Procaine   ?  Passes out  ? Pseudoephedrine Hcl   ?  "feel drunk"  ? Tramadol   ?  "Made me go wonky, got fluid in my lungs"  ?  ? ?Home Medications  ?Prior to Admission medications   ?Medication Sig Start Date  End Date Taking? Authorizing Provider  ?acetaminophen (TYLENOL) 500 MG tablet Take 500 mg by mouth every 8 (eight) hours as needed for moderate pain or headache.   Yes [provider]  ?albuterol (VENTOLIN H

## 2021-06-18 NOTE — Progress Notes (Signed)
Palliative: ? ?HPI: 76 y.o. female  with past medical history of obesity, diabetes, hypertension, OSA, chronic hypoxic respiratory failure, chronic [name], chronic bilateral extremity wounds, diastolic heart failure, permanent A-fib on Eliquis who is followed by [facility] for home hospice admitted on 05/24/2021 with weakness, presumptive UTI, A-fib and COPD.  Palliative consulted for goals of care. ? ?I met today at [name] bedside along with her chaplain from her facility at bedside. [name] is distraught and agitated and repeatedly saying "help me" and asking to take off her oxygen mask. I revisited at her bedside multiple times throughout the day. She is able to share with myself, chaplain, and hospice liaison at bedside that she is tired and "I want to go to heaven." She asks to take off oxygen mask and able to voice consequences that she knows she will die. I tell her that we can use medication for breathing and anxiety to keep her comfortable along with nasal oxygen and she agrees. We discussed CPR and breathing tubes/machines and she is able to voice she does NOT want these measures but rather medication to alleviate suffering. Per hospice liaison she was DNR at facility. I was able to reach son [name] who is en-route to the hospital. I share my conversation with his mother and my plans to put in place DNR and provide some medications for her comfort and he agrees.  ? ?I met with son, [name], at bedside. [name] is finally resting comfortably. [name] is understandably tearful at the sudden decline of his mother but also able to share that he does not want her to suffer anymore and he knows that she is ready. We discussed focus on comfort and consideration of deescalation of aggressive interventions that may be prolonging her suffering when the rest of the family arrives. I left [name] to have some time with his mother.  ? ?I met again at bedside with son, sister (her husband), niece and  [name] shares that this is their family - they are all here. I discussed with them [name] decline and her wishes shared with [place] this morning. They all know that she is ready and that she has been suffering. Her sister says that she has told her how she has no quality of life. They all agree with transition to full comfort care and de-escalating prolonging measures. They do not want her to suffer. Educated on how to access chaplain support if desired.  ? ?All questions/concerns addressed. Emotional support provided.  ? ?Exam: Alert, oriented to person, place, situation. Agitated and distressed at the beginning of my visit but restful and comfortable later in the day by the time family arrives to bedside. Changed from NRB to [place] and labored breathing alleviated with medication. Abd soft. HR irregular, tachy.  ? ?Plan:  ?- Full comfort care. Orders changed to reflect comfort care.  ?- Anticipate hospital death.  ? ?90 min ? ?Vinie Sill, NP ?Palliative Medicine Team ?Pager [phone] (Please see [email or web address] for schedule) ?Team Phone [phone]  ? ? ?Greater than 50%  of this time was spent counseling and coordinating care related to the above assessment and plan   ?

## 2021-06-18 NOTE — Significant Event (Signed)
Patient's nurse notified me that patient was acutely short of breath this morning at around 5 a.m.  Patient's blood pressure is 130/80 pulse is 80/min on amiodarone drip patient afebrile.  Patient is tachypneic.  Ordered chest x-ray stat.  Ordered Lasix 40 mg IV stat.  On exam at bedside patient is mildly confused and still short of breath ordered another dose of Lasix 40 mg IV.  ABG shows pH of 7.34 PCO2 57 PO2 71 on 10 L oxygen.  Will order BiPAP transfer patient to stepdown.  Chest x-ray results are pending.  I reviewed patient's labs and home medications. ? ?Gean Birchwood ?

## 2021-06-18 NOTE — Progress Notes (Signed)
[name]) hospitalized hospice patient visit ?  ?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 Patient resides in long term care facility and began complaining of lack of urination or BM for the last 2-3 days as well as worsening weakness. She was transferred to [facility] ED for evaluation and hospice was notified at that time. She was admitted to the hospital on 5.4.23 with a diagnosis of weakness and UTI. Per Dr. Orpah Melter with [facility] this is a related hospice admission.  ?  ?Patient with increasing shortness of breath overnight requiring a higher level of care. She was placed on BiPaP but patient stated she did not want the mask any longer. At bedside I met with chaplain from [facility] and PMT, [place] were discussed and the patient made the decision and stated she was "ready to pass". Family is on the way from [place]. Patient is now DNR, full comfort measures, likely hospital death.  ?  ?Patient is inpatient appropriate due to need for IV Morphine 2-36m, Ativan 1-242mPRN.  ?  ?Vital Signs-  120/58, 115 HR, spO2 87% 6L [place] ?Intake/Output- 252/250 ?Abnormal labs-   ? Latest Reference Range & Units 05May 22, 20234:27  ?COMPREHENSIVE METABOLIC PANEL  Rpt !  ?Sodium 135 - 145 mmol/L 137  ?Potassium 3.5 - 5.1 mmol/L 3.5  ?Chloride 98 - 111 mmol/L 93 (L)  ?CO2 22 - 32 mmol/L 33 (H)  ?Glucose 70 - 99 mg/dL 287 (H)  ?BUN 8 - 23 mg/dL 40 (H)  ?Creatinine 0.44 - 1.00 mg/dL 1.56 (H)  ?Calcium 8.9 - 10.3 mg/dL 9.2  ?Anion gap 5 - 15  11  ?Alkaline Phosphatase 38 - 126 U/L 57  ?Albumin 3.5 - 5.0 g/dL 3.1 (L)  ?AST 15 - 41 U/L 66 (H)  ?ALT 0 - 44 U/L 61 (H)  ?Total Protein 6.5 - 8.1 g/dL 6.7  ?Total Bilirubin 0.3 - 1.2 mg/dL 0.8  ?GFR, Estimated >60 mL/min 35 (L)  ?WBC 4.0 - 10.5 K/uL 9.8  ?RBC 3.87 - 5.11 MIL/uL 2.98 (L)  ?Hemoglobin 12.0 - 15.0 g/dL 7.4 (L)  ?HCT 36.0 - 46.0 % 25.7 (L)  ?MCV 80.0 - 100.0 fL 86.2   ?MCH 26.0 - 34.0 pg 24.8 (L)  ?MCHC 30.0 - 36.0 g/dL 28.8 (L)  ?RDW 11.5 - 15.5 % 19.3 (H)  ?Platelets 150 - 400 K/uL 231  ?nRBC 0.0 - 0.2 % 1.1 (H)  ? ?Diagnostics- None new  ?  ?Problem List ?Acute Hypoxic and Hypercarbic Respiratory Failure  ?Acute CHF exacerbation ?AFRVR - driven by above ?Acute encephalopathy secondary to above ?-Pt received Lasix and placed on Bipap  ?-Unable to tolerate BiPAP due to agitation. Given ativan 0.5 mg with improvement. PRNs ordered ?-Palliative team met with patient and code status changed to DNR/DNI. Per consult team, may transition to comfort when family arrives ?    ?Discharge Planning- Full comfort, hospital death ?Family Contact- updated by phone ?IDT- Updated ?Goals of care - Full comfort care ?  ? ?MeClementeen HoofBSN, RN ?[facility] ?[phone] ? ?

## 2021-06-18 NOTE — Progress Notes (Addendum)
ANTICOAGULATION CONSULT NOTE - Initial Consult ? ?Pharmacy Consult for Heparin ?Indication: atrial fibrillation ? ?Allergies  ?Allergen Reactions  ? Metaxalone Anaphylaxis  ? Nitroglycerin   ?  BP bottoms out  ? Tramadol-Acetaminophen Anaphylaxis  ? Icosapent Ethyl Other (See Comments)  ?  Stomach pain, gas, diarrhea, headache from Vascepa  ? Brompheniramine-Pseudoeph   ?  Feels drunk  ? Meprobamate Other (See Comments)  ?  unknown  ? Metformin Nausea Only  ?  "flushes"  ? Methyldopa   ?  Other reaction(s): Unknown  ? Metoprolol   ?  Other reaction(s): Unknown  ? Oxycodone   ?  Pt affirms allergy but can't remember what her reaction is  ? Procaine   ?  Passes out  ? Pseudoephedrine Hcl   ?  "feel drunk"  ? Tramadol   ?  "Made me go wonky, got fluid in my lungs"  ? ? ?Patient Measurements: ?Height: '5\' 3"'$  (160 cm) ?Weight: (!) 141.3 kg (311 lb 8.2 oz) ?IBW/kg (Calculated) : 52.4 ?Heparin Dosing Weight: 89 kg ? ?Vital Signs: ?Temp: 98.9 ?F (37.2 ?C) (05/08 9509) ?Temp Source: Axillary (05/08 0758) ?BP: 139/63 (05/08 0900) ?Pulse Rate: 114 (05/08 0900) ? ?Labs: ?Recent Labs  ?  05/22/21 ?1416 05/23/21 ?3267 05/23/21 ?1245 05/24/21 ?8099 06/08/21 ?0427  ?HGB  --  8.0*   < > 7.7* 7.4*  ?HCT  --  28.9*  --  27.8* 25.7*  ?PLT  --  258  --  241 231  ?CREATININE  --  1.53*  --  1.39*  1.37* 1.56*  ?TROPONINIHS 39*  --   --   --   --   ? < > = values in this interval not displayed.  ? ? ?Estimated Creatinine Clearance: 44 mL/min (A) (by C-G formula based on SCr of 1.56 mg/dL (H)). ? ? ?Medical History: ?Past Medical History:  ?Diagnosis Date  ? Anemia of chronic disease   ? Chronic diastolic CHF (congestive heart failure) ([place])   ? Chronic kidney disease, stage 3b ([place])   ? Chronic pain   ? Coronary artery disease   ? Diabetes mellitus type 2 in obese [facility])   ? Heart failure ([place])   ? chronic  oxygen 3 liters  ? Hyperlipidemia   ? Hypertension   ? Iron deficiency anemia   ? Morbid obesity ([place])   ? OSA (obstructive sleep  apnea)   ? Persistent atrial fibrillation ([place])   ? Pheochromocytoma   ? s/p resection  ? RLS (restless legs syndrome)   ? Venous stasis   ? ? ?Medications:  ?Scheduled:  ? chlorhexidine  15 mL Mouth Rinse BID  ? Chlorhexidine Gluconate Cloth  6 each Topical Daily  ? digoxin  0.25 mg Intravenous Daily  ? diltiazem  60 mg Oral Q6H  ? ferrous sulfate  325 mg Oral Q breakfast  ? fluticasone furoate-vilanterol  1 puff Inhalation Daily  ? furosemide  80 mg Intravenous BID  ? insulin aspart  0-20 Units Subcutaneous TID WC  ? insulin aspart  0-5 Units Subcutaneous QHS  ? insulin aspart protamine- aspart  25 Units Subcutaneous BID WC  ? liver oil-zinc oxide   Topical Daily  ? mouth rinse  15 mL Mouth Rinse BID  ? mouth rinse  15 mL Mouth Rinse q12n4p  ? multivitamin with minerals  1 tablet Oral Daily  ? rosuvastatin  20 mg Oral QHS  ? senna-docusate  2 tablet Oral BID  ? umeclidinium bromide  1 puff  Inhalation Daily  ? vitamin B-12  1,000 mcg Oral Daily  ? ?Infusions:  ? amiodarone 30 mg/hr (05-26-2021 0800)  ? cefTRIAXone (ROCEPHIN)  IV 200 mL/hr at 05/26/21 0800  ? ? ?Assessment: ?42 yoF admitted on 5/4 with weakness and urinary retention.  Transferred to ICU on 5/8 for increasing hypoxia, tachypnea, Afib with RVR.  She is on chronic Apixaban at home for Afib, last dose taken on 5/7 PM. Pharmacy is now consulted for Heparin IV.  ? ?Today, May 26, 2021: ?Baseline aptt, HL ordered ?CBC: Hgb down to 7.4, Plt remains WNL ?No bleeding reported ?SCr up at 1.56 ? ? ?Goal of Therapy:  ?Heparin level 0.3-0.7 units/ml ?aPTT 66-102 seconds ?Monitor platelets by anticoagulation protocol: Yes ?  ?Plan:  ?No heparin bolus d/t recent apixaban use.  ?Start heparin IV infusion at 1350 units/hr ?APTT, Heparin level 8 hours after starting ?- anticipate that heparin level will be falsely elevated d/t recent apixaban use.  Will plan to use APTT for heparin dosing/titration until it correlates with daily heparin level.   ?Daily heparin level and  CBC ? ? ?Gretta Arab PharmD, BCPS ?Clinical Pharmacist ?[facility] [phone] ?26-May-2021 9:22 AM ?

## 2021-06-18 NOTE — Discharge Summary (Signed)
?Death Summary  ?[name] ZJI:[id number] DOB: [birth year] DOA: 06/13/21 ? ?PCP: [name], NP ? ?Admit date: Jun 13, 2021 ?Date of Death: 06-17-2021 ?Time of Death: 59 ?Notification: [name], NP notified of death of 17-Jun-2021 ? ? ?History of present illness:  ? ?Patient was a 76 y.o. female with medical history significant for severe morbid obesity, type 2 diabetes, hypertension, OSA on CPAP, chronic hypoxic respiratory failure on 3 L [place], chronic pain syndrome on opiates, chronic bilateral lower extremity wounds, chronic diastolic CHF, permanent A-fib on Eliquis, current hospice patient with [facility], who presented to [place] ED from SNF due to generalized weakness and urinary retention for the past 2-3 days.  She has been eating and drinking without difficulty.  Has been avoiding salt intake as instructed.  No fevers.  In the ED, during her physical exam she was noted to have abdominal edema and extensive perineal area excoriation and oozing of blood from that area.  Also noted to have electrolytes abnormalities.  Patient declined Foley catheter placement.  EDP requested admission for further evaluation and management of her symptomatology.  Patient was accepted as a direct admit by Dr. Louanne Belton, [facility], hospitalist service to [facility] telemetry unit. ? ?Final Diagnoses:  ?Generalized weakness, suspect multifactorial ?Likely contributed by chronic illness, multiple hospitalizations, hypokalemia 2.8, presumptive UTI ?Pt was given course of empiric abx ?  ?Presumptive UTI ?UA positive for pyuria ?Given course of Rocephin 2 g daily ?Urine cx with multiple species present. Repeat urine cx without growth ?  ?Hypokalemia ?Corrected ?Later transitioned to comfort measures only ?  ?Permanent atrial fibrillation with RVR ?Resumed home Eliquis for CVA prevention ?Noted to be in RVR since time of presentation  ?Given soft BP, was later continued on amiodarone gtt  ?Patient remained in RVR despite  assistance by Cardiology ?With worsening condition, pt was transitioned to comfort measures only ?  ?COPD with chronic hypoxia ?On 3 L nasal cannula at baseline ?Patient with progressively worsening hypoxemia, ultimately requiring BIPAP support ?Multiple chest imaging including CT demonstrated pulm edema with vol overload ?Given lasix this visit ?  ?Hyperlipidemia ?Patient was continued on crestor ?Later transitioned to comfort measures only ?  ?Iron deficiency anemia ?Was continued on home ferrous sulfate daily ?Patient later transitioned to comfort measures only ?  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  ?Severe morbid obesity ?Had remained stable ?  ?Restless leg syndrome/chronic pain syndrome/vitamin deficiency ?Resumed home regimen ?  ?Acute on chronic diastolic CHF with acute hypoxemic respiratory failure ?-CXR with findings suggestive of pulm edema and pleural effusion ?-BLE pitting edema on exam ?-With worsening hypoxemic failure, ordered CT chest with findings of cardiomegaly and pulm edema ?  ?DNR, end of life ?-despite multiple doses of IV lasix and assistance by Cardiology, patient o2 requirements progressively worsened ?-Palliative Care was consulted and after discussion with family, pt's wishes were confirmed DNR/DNI.  ?-With grim prognosis, pt was later transitioned to comfort care only. Patient was later pronounced at 1755 on 06/17/2021 ?  ? ? ?The results of significant diagnostics from this hospitalization (including imaging, microbiology, ancillary and laboratory) are listed below for reference.   ? ?Significant Diagnostic Studies: ?DG Tibia/Fibula Left ? ?Result Date: 05/05/2021 ?CLINICAL DATA:  Soft tissue infection, bilateral lower extremity swelling, redness, draining getting worse since September, worse at the left lower extremity EXAM: LEFT TIBIA AND FIBULA - 2 VIEW  COMPARISON:  None. FINDINGS: There is no evidence of fracture or other focal bone lesions. Visualized knee joint demonstrates severe narrowing of the medial compartment with prominent marginal osteophytes. Vascular calcifications. There is moderate soft tissue edema with a defect at the medial aspect of the ankle. No pneumatosis in the soft tissues. IMPRESSION: Moderate pedal edema with an ulcer at the medial aspect of the ankle. Tibia and fibula have a normal appearance. Severe degenerative changes with loss of the medial compartment at the visualized knee joint. Electronically Signed   By: Frazier Richards M.D.   On: 05/05/2021 14:20  ? ?CT CHEST WO CONTRAST ? ?Result Date: 05/24/2021 ?CLINICAL DATA:  Chest wall pain, nontraumatic, infection or inflammation suspected, xray done EXAM: CT CHEST WITHOUT CONTRAST TECHNIQUE: Multidetector CT imaging of the chest was performed following the standard protocol without IV contrast. RADIATION DOSE REDUCTION: This exam was performed according to the departmental dose-optimization program which includes automated exposure control, adjustment of the mA and/or kV according to patient size and/or use of iterative reconstruction technique. COMPARISON:  Radiograph 05/22/2021, additional priors reviewed FINDINGS: Cardiovascular: Aortic atherosclerosis. No aortic aneurysm. Multi chamber cardiomegaly. There are coronary artery calcifications. No significant pericardial effusion. Mild aortic valvular calcifications. Mediastinum/Nodes: Shotty mediastinal lymph nodes are all subcentimeter short axis and typically reactive. There is no bulky mediastinal adenopathy. Limited assessment for hilar adenopathy on this unenhanced exam. No enlarged axillary nodes. No visualized thyroid nodule. The esophagus is decompressed, tiny hiatal hernia. Lungs/Pleura: There are small bilateral pleural effusions, right greater than left. Associated compressive atelectasis. Areas of ground-glass and septal  thickening in both lungs typical of pulmonary edema. No visualized pulmonary mass or suspicious nodule. No endobronchial lesion. There may be bronchomalacia in the lower lobes. Upper Abdomen: Prominent liver partially included. Cholecystectomy. No acute upper abdominal findings. Musculoskeletal: Thoracic spondylosis. There are no acute or suspicious osseous abnormalities. No focal chest wall soft tissue abnormalities. IMPRESSION: 1. Cardiomegaly with small bilateral pleural effusions and pulmonary edema. Findings consistent with fluid overload/CHF. 2. Aortic atherosclerosis.  Coronary artery calcifications. 3. Shotty mediastinal lymph nodes are typically reactive. Aortic Atherosclerosis (ICD10-I70.0). Electronically Signed   By: Keith Rake M.D.   On: 05/24/2021 15:49  ? ?DG CHEST PORT 1 VIEW ? ?Result Date: May 31, 2021 ?CLINICAL DATA:  Shortness of breath. EXAM: PORTABLE CHEST 1 VIEW COMPARISON:  05/24/2021 FINDINGS: Stable cardiomediastinal contours. Aortic atherosclerotic calcifications. There is scratch set no change in decreased aeration to both lung bases which likely represents a combination of pleural effusions with atelectasis. Mild diffuse interstitial edema is again noted and appears similar. IMPRESSION: 1. Stable CHF pattern. 2. Persistent bibasilar opacities, likely a combination of pleural effusions with atelectasis. Electronically Signed   By: Kerby Moors M.D.   On: May 31, 2021 06:18  ? ?DG CHEST PORT 1 VIEW ? ?Result Date: 05/22/2021 ?CLINICAL DATA:  Dyspnea.  History of diabetes.  Heart failure. EXAM: PORTABLE CHEST 1 VIEW COMPARISON:  Yesterday FINDINGS: Mildly degraded exam due to AP portable technique and patient body habitus. Numerous leads and wires project over the chest. Patient rotated right. Midline trachea. Cardiomegaly accentuated by AP portable technique. Atherosclerosis in the transverse aorta. Probable small layering bilateral pleural effusions. No pneumothorax. Interstitial edema is  mild, increased. Probable bibasilar airspace disease, increased. IMPRESSION: Overall slightly worsened aeration with cardiomegaly and mild interstitial edema. Probable layering bilateral pleural effusions with adjacent b

## 2021-06-18 NOTE — Procedures (Signed)
? ?Progress Note ? ?Patient Name: [name] ?Date of Encounter: 2021-06-05 ? ?Primary Cardiologist: Evalina Field, MD  ? ?Subjective  ? ?Overnight has increased tachypnea. ?Needed higher doses of O2.   ?Transitioned to BIPAP. ?Increased to 16/5 (I/E). ?Brought to the unit. ? ?Heart rates are worse. ?She is oriented but somnolent (in a hospital, came because of the fluid).   ? ?Inpatient Medications  ?  ?Scheduled Meds: ? apixaban  5 mg Oral BID  ? chlorhexidine  15 mL Mouth Rinse BID  ? Chlorhexidine Gluconate Cloth  6 each Topical Daily  ? diltiazem  60 mg Oral Q6H  ? ferrous sulfate  325 mg Oral Q breakfast  ? fluticasone furoate-vilanterol  1 puff Inhalation Daily  ? insulin aspart  0-20 Units Subcutaneous TID WC  ? insulin aspart  0-5 Units Subcutaneous QHS  ? insulin aspart protamine- aspart  25 Units Subcutaneous BID WC  ? liver oil-zinc oxide   Topical Daily  ? mouth rinse  15 mL Mouth Rinse BID  ? mouth rinse  15 mL Mouth Rinse q12n4p  ? multivitamin with minerals  1 tablet Oral Daily  ? rosuvastatin  20 mg Oral QHS  ? senna-docusate  2 tablet Oral BID  ? umeclidinium bromide  1 puff Inhalation Daily  ? vitamin B-12  1,000 mcg Oral Daily  ? ?Continuous Infusions: ? amiodarone 30 mg/hr (05/24/21 2225)  ? cefTRIAXone (ROCEPHIN)  IV 2 g (05/24/21 2223)  ? ?PRN Meds: ?ALPRAZolam, HYDROcodone-acetaminophen, ipratropium-albuterol, levalbuterol, melatonin, morphine injection, ondansetron (ZOFRAN) IV, polyethylene glycol, rOPINIRole  ? ?Vital Signs  ?  ?Vitals:  ? 05-Jun-2021 0644 2021/06/05 0650 06/05/21 0700 05-Jun-2021 0758  ?BP: (!) 149/118  (!) 130/49 (!) 130/49  ?Pulse: (!) 127 (!) 124 (!) 127 (!) 130  ?Resp: (!) 21 16 (!) 21 15  ?Temp: 98.2 ?F (36.8 ?C)     ?TempSrc: Axillary     ?SpO2: 100% 98% 99% 95%  ?Weight:      ?Height:      ? ? ?Intake/Output Summary (Last 24 hours) at 05-Jun-2021 0805 ?Last data filed at 06-05-2021 8119 ?Gross per 24 hour  ?Intake 273.01 ml  ?Output 1700 ml  ?Net -1426.99 ml  ? ?Filed  Weights  ? 05/22/21 0500 05/23/21 0500 06-05-2021 1478  ?Weight: (!) 143.7 kg (!) 143.3 kg (!) 141.3 kg  ? ? ?Telemetry  ?  ?AF RVR - Personally Reviewed ? ?Physical Exam  ? ?Gen: Moderate distress, Super-morbid obesity   ?Neck: Thick neck with BIPAP; unreliable JVD assessment ?Cardiac: No Rubs or Gallops, no Murmur, IRIR tachycardia, distant heart sounds ?Respiratory: Decreased breath sounds bilaterally with tachypnea ?GI: Soft, nontender, non-distended  ?MS: legs are wrapped by laterally, but with +2 pitting edema;  moves all extremities (hand mitts) ?Integument: Skin feels warm ?Neuro:  At time of evaluation, alert and oriented to person/place/time/situation  ? ?Labs  ?  ?Chemistry ?Recent Labs  ?Lab 05/23/21 ?2956 05/24/21 ?2130 06-05-21 ?0427  ?NA 139 138  138 137  ?K 4.7 4.0  4.0 3.5  ?CL 95* 95*  95* 93*  ?CO2 32 32  32 33*  ?GLUCOSE 325* 256*  261* 287*  ?BUN 29* 33*  35* 40*  ?CREATININE 1.53* 1.39*  1.37* 1.56*  ?CALCIUM 8.9 9.3  9.3 9.2  ?PROT 7.0 6.7 6.7  ?ALBUMIN 3.0* 3.0* 3.1*  ?AST 25 34 66*  ?ALT 21 28 61*  ?ALKPHOS 49 52 57  ?BILITOT 0.6 0.6 0.8  ?GFRNONAA 35* 40*  41*  35*  ?ANIONGAP '12 11  11 11  '$ ?  ? ?Hematology ?Recent Labs  ?Lab 05/23/21 ?4481 05/24/21 ?8563 2021-06-06 ?0427  ?WBC 10.9* 10.7* 9.8  ?RBC 3.32* 3.20* 2.98*  ?HGB 8.0* 7.7* 7.4*  ?HCT 28.9* 27.8* 25.7*  ?MCV 87.0 86.9 86.2  ?MCH 24.1* 24.1* 24.8*  ?MCHC 27.7* 27.7* 28.8*  ?RDW 18.6* 18.8* 19.3*  ?PLT 258 241 231  ? ? ?Cardiac EnzymesNo results for input(s): TROPONINI in the last 168 hours. No results for input(s): TROPIPOC in the last 168 hours.  ? ?BNP ?Recent Labs  ?Lab 06/17/2021 ?1497  ?BNP 85.3  ?  ? ?DDimer No results for input(s): DDIMER in the last 168 hours.  ? ?Radiology  ?  ?CT CHEST WO CONTRAST ? ?Result Date: 05/24/2021 ?CLINICAL DATA:  Chest wall pain, nontraumatic, infection or inflammation suspected, xray done EXAM: CT CHEST WITHOUT CONTRAST TECHNIQUE: Multidetector CT imaging of the chest was performed following  the standard protocol without IV contrast. RADIATION DOSE REDUCTION: This exam was performed according to the departmental dose-optimization program which includes automated exposure control, adjustment of the mA and/or kV according to patient size and/or use of iterative reconstruction technique. COMPARISON:  Radiograph 05/22/2021, additional priors reviewed FINDINGS: Cardiovascular: Aortic atherosclerosis. No aortic aneurysm. Multi chamber cardiomegaly. There are coronary artery calcifications. No significant pericardial effusion. Mild aortic valvular calcifications. Mediastinum/Nodes: Shotty mediastinal lymph nodes are all subcentimeter short axis and typically reactive. There is no bulky mediastinal adenopathy. Limited assessment for hilar adenopathy on this unenhanced exam. No enlarged axillary nodes. No visualized thyroid nodule. The esophagus is decompressed, tiny hiatal hernia. Lungs/Pleura: There are small bilateral pleural effusions, right greater than left. Associated compressive atelectasis. Areas of ground-glass and septal thickening in both lungs typical of pulmonary edema. No visualized pulmonary mass or suspicious nodule. No endobronchial lesion. There may be bronchomalacia in the lower lobes. Upper Abdomen: Prominent liver partially included. Cholecystectomy. No acute upper abdominal findings. Musculoskeletal: Thoracic spondylosis. There are no acute or suspicious osseous abnormalities. No focal chest wall soft tissue abnormalities. IMPRESSION: 1. Cardiomegaly with small bilateral pleural effusions and pulmonary edema. Findings consistent with fluid overload/CHF. 2. Aortic atherosclerosis.  Coronary artery calcifications. 3. Shotty mediastinal lymph nodes are typically reactive. Aortic Atherosclerosis (ICD10-I70.0). Electronically Signed   By: Keith Rake M.D.   On: 05/24/2021 15:49  ? ?DG CHEST PORT 1 VIEW ? ?Result Date: Jun 06, 2021 ?CLINICAL DATA:  Shortness of breath. EXAM: PORTABLE CHEST 1  VIEW COMPARISON:  05/24/2021 FINDINGS: Stable cardiomediastinal contours. Aortic atherosclerotic calcifications. There is scratch set no change in decreased aeration to both lung bases which likely represents a combination of pleural effusions with atelectasis. Mild diffuse interstitial edema is again noted and appears similar. IMPRESSION: 1. Stable CHF pattern. 2. Persistent bibasilar opacities, likely a combination of pleural effusions with atelectasis. Electronically Signed   By: Kerby Moors M.D.   On: 06-06-2021 06:18   ? ?Cardiac Studies  ? ? ?Transthoracic Echocardiogram: ?Date: 04/07/21 ?Results:  Very difficult study ? 1. Left ventricular ejection fraction, by estimation, is 60 to 65%. The  ?left ventricle has normal function. Left ventricular endocardial border  ?not optimally defined to evaluate regional wall motion. The left  ?ventricular internal cavity size was  ?severely dilated. There is moderate asymmetric left ventricular  ?hypertrophy of the basal-septal segment. Left ventricular diastolic  ?parameters are indeterminate.  ? 2. Right ventricular systolic function was not well visualized. The right  ?ventricular size is not well visualized. Tricuspid regurgitation signal is  ?inadequate  for assessing PA pressure.  ? 3. The mitral valve is grossly normal. No evidence of mitral valve  ?regurgitation.  ? 4. The aortic valve was not well visualized. Aortic valve regurgitation  ?is not visualized.  ? 5. The inferior vena cava is dilated in size with >50% respiratory  ?variability, suggesting right atrial pressure of 8 mmHg.  ? 6. Technically difficult study ? ?NonCardiac CT: ?Date: 05/24/21 ?Results: ?Significant bilateral pleural effusions ?3 V CAC ?AV Calcium ?Aortic atherosclerosis ? ? ? ?Patient Profile  ?   ?76 y.o. female HFpEF and COPD who presents with new AF and is in the unit for worsening mentation and tachypnea ? ?Assessment & Plan  ?  ?Acute on chronic hypoxic respiratory failure ?Known COPD  on Home O2 ?OSA on CPAP ?CKD Stage IIIb Creatinine ~ 1.5 with 2023 variability ?HFpEF Acute on chronic ?HTN with DM ?Aortic Valve Sclerosis ?Hx of bilateral LE cellulitis with PAD  ?- NYHA class IV, Stage C, hypervolemic

## 2021-06-18 NOTE — Progress Notes (Signed)
Unit nurse notified IV team nurse upon arrival to unit, that at this time no further PIVs were needed at this time. VU Fran Lowes, RN VAST ?

## 2021-06-18 NOTE — Progress Notes (Signed)
Nutrition Brief Note ? ?Consult received for assessment of nutrition requirements and status. Chart reviewed. ?Patient now transitioning to comfort care with Hospice note stating anticipated hospital death. ?  ?No further nutrition interventions planned at this time. Please re-consult as needed.  ? ? ? ? ?Jarome Matin, MS, RD, LDN ?Registered Dietitian II ?Inpatient Clinical Nutrition ?RD pager # and on-call/weekend pager # available in [place]  ? ? ?

## 2021-06-18 NOTE — Progress Notes (Signed)
Chaplain engaged in an initial visit with [name].  Upon arrival, [name] was very agitated and uncomfortable.  She kept stating, "Help."  [name] was being comforted by a Chaplain, [place], from her assisted living facility.  [name] shared about [employer] journey.  She noted that [name] does not have any family and those that she does have are estranged.  She voiced that [facility] stays in her room most days at her facility.  She also shared about [name]'s fear of dying.  Chaplain could assess [name]'s fear through her agitation.   ? ?Nursing team did a great job of supporting [name], calling in the attending physician, and getting what was needed to provide her with some comfort within her anxiety.  ? ?Chaplain checked in and offered support to those offering [name] care.  [name], who has relationship with [name], did a great job of being there for [name].  ? ? ? 11-Jun-2021 1100  ?Clinical Encounter Type  ?Visited With Patient;Health care provider  ?Visit Type Initial  ?Referral From Palliative care team  ?Consult/Referral To Chaplain  ? ? ?

## 2021-06-18 NOTE — Progress Notes (Signed)
?   2021-05-30 0500  ?Assess: MEWS Score  ?Temp 98.2 ?F (36.8 ?C)  ?BP 134/90  ?Pulse Rate (!) 123  ?ECG Heart Rate (!) 120  ?Resp 19  ?Level of Consciousness Alert  ?SpO2 94 %  ?O2 Device Nasal Cannula  ?Patient Activity (if Appropriate) In bed  ?O2 Flow Rate (L/min) 10 L/min  ?Assess: MEWS Score  ?MEWS Temp 0  ?MEWS Systolic 0  ?MEWS Pulse 2  ?MEWS RR 0  ?MEWS LOC 0  ?MEWS Score 2  ?MEWS Score Color Yellow  ?Assess: if the MEWS score is Yellow or Red  ?Were vital signs taken at a resting state? Yes  ?Focused Assessment Change from prior assessment (see assessment flowsheet)  ?Does the patient meet 2 or more of the SIRS criteria? Yes  ?Does the patient have a confirmed or suspected source of infection? Yes  ?Provider and Rapid Response Notified? Yes  ?MEWS guidelines implemented *See Row Information* Yes  ?Treat  ?MEWS Interventions Escalated (See documentation below);Consulted Respiratory Therapy ?(Doctor at bedside)  ?Pain Scale FLACC  ?Pain Score 5  ?Take Vital Signs  ?Increase Vital Sign Frequency  Yellow: Q 2hr X 2 then Q 4hr X 2, if remains yellow, continue Q 4hrs  ?Escalate  ?MEWS: Escalate Yellow: discuss with charge nurse/RN and consider discussing with provider and RRT  ?Notify: Charge Nurse/RN  ?Name of Charge Nurse/RN Notified lauren  ?Date Charge Nurse/RN Notified May 30, 2021  ?Time Charge Nurse/RN Notified 0500  ?Notify: Provider  ?Provider Name/Title Hal Hope  ?Date Provider Notified May 30, 2021  ?Time Provider Notified 0500  ?Notification Type Face-to-face  ?Notification Reason Change in status  ?Provider response See new orders  ?Date of Provider Response 05-30-21  ?Time of Provider Response 0501  ?Notify: Rapid Response  ?Name of Rapid Response RN Notified Junie Panning  ?Date Rapid Response Notified 2021/05/30  ?Time Rapid Response Notified 0500  ?Document  ?Patient Outcome Transferred/level of care increased ?(transferred to ICU/stepdown)  ?Progress note created (see row info) Yes  ?Assess: SIRS CRITERIA  ?SIRS  Temperature  0  ?SIRS Pulse 1  ?SIRS Respirations  0  ?SIRS WBC 0  ?SIRS Score Sum  1  ? ? ?

## 2021-06-18 NOTE — Progress Notes (Signed)
PT with increasing tachypnea and restlessness. Oxygen saturations 85% on 5 liters nasal canula, oxygen increased to 10liters/min nasal canula with saturations at 94%. HR 130-145. RT, charge RN, Rapid RN and Dr Hal Hope called and made aware. '40mg'$  IV lasix x 2 given. Chest xray and ABG ordered. PT transferred to ICU/Stepdown per Dr Hal Hope. Son called and made aware of transfer.  ?

## 2021-06-18 DEATH — deceased

## 2021-08-02 IMAGING — DX DG CHEST 1V PORT
1 series · 1 of 1 positions shown · non-contrast
Comparison: None.

CLINICAL DATA: Shortness of breath

EXAM:
PORTABLE CHEST 1 VIEW

[chest ap]
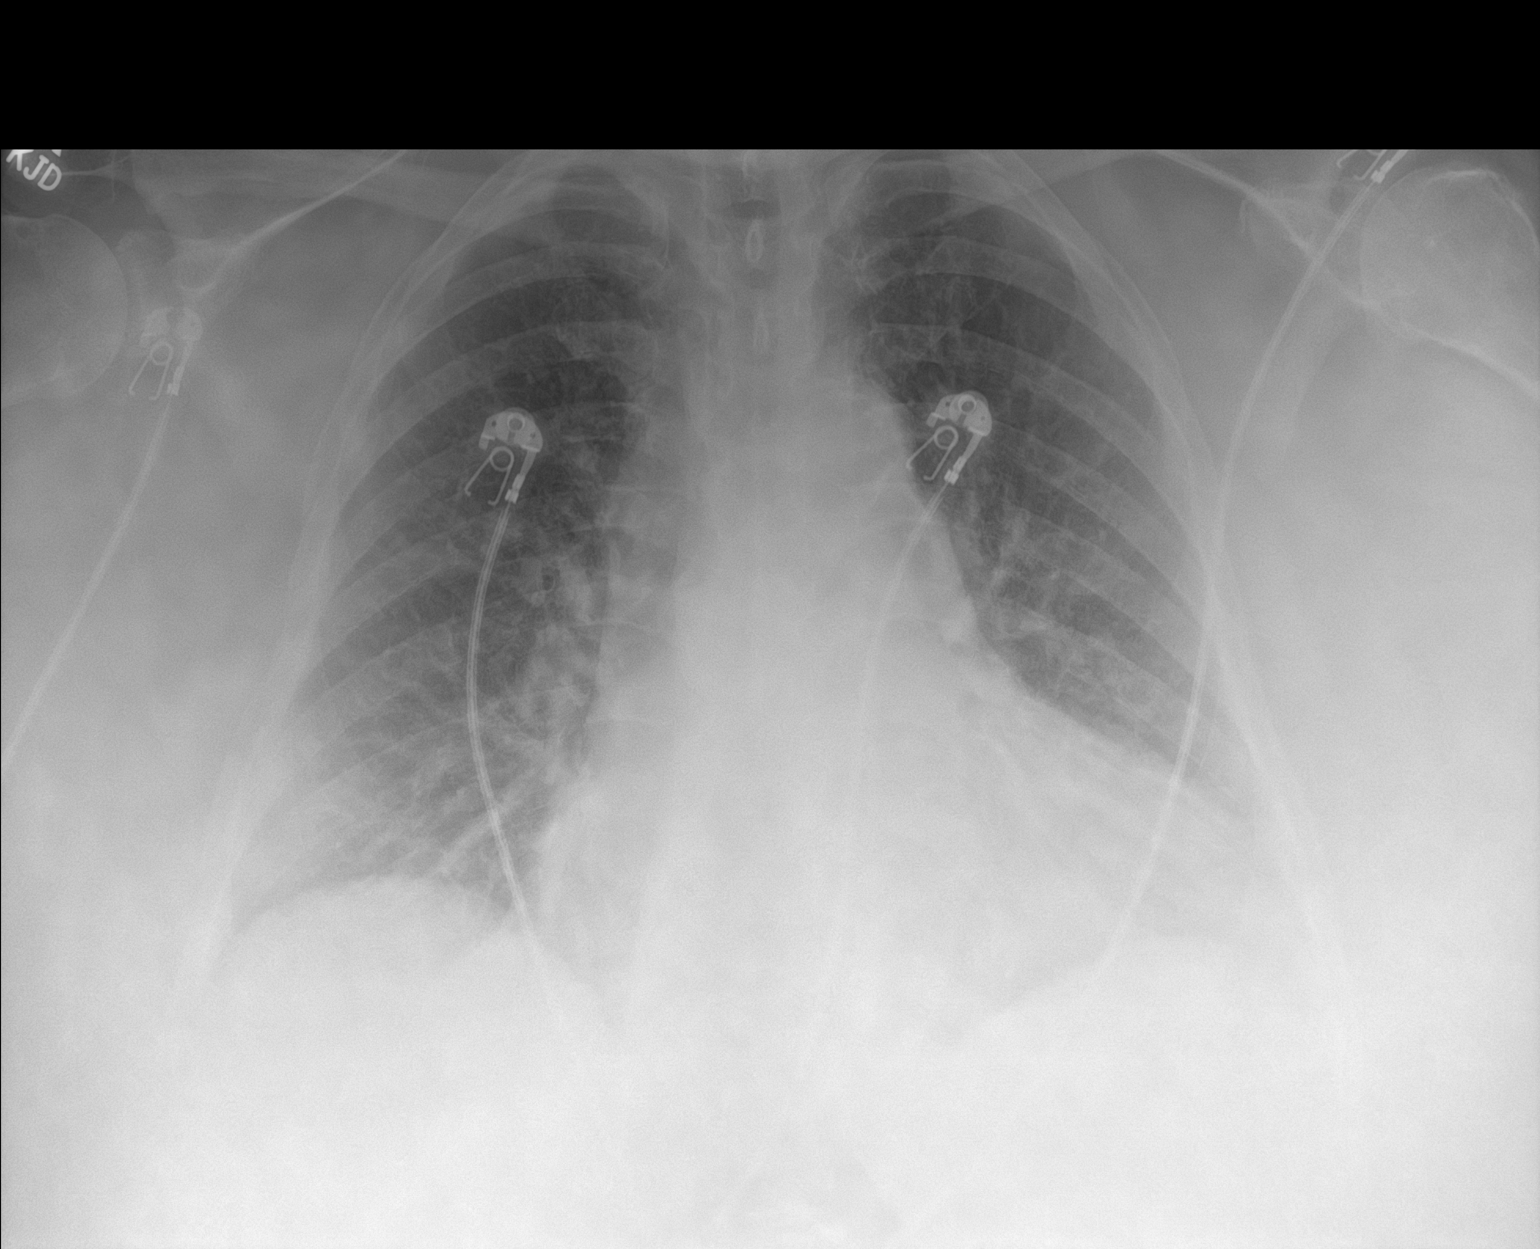

[1 of 1 positions shown; findings below may reference images not displayed]

FINDINGS: Cardiomegaly, vascular congestion. No overt edema. No confluent
opacities or effusions. No acute bony abnormality.
IMPRESSION: Cardiomegaly, vascular congestion.

## 2022-08-21 IMAGING — DX DG CHEST 1V PORT
1 series · 1 of 1 positions shown · non-contrast
Comparison: Radiograph 04/06/2021

CLINICAL DATA: Shortness of breath

EXAM:
PORTABLE CHEST 1 VIEW

[chest ap]
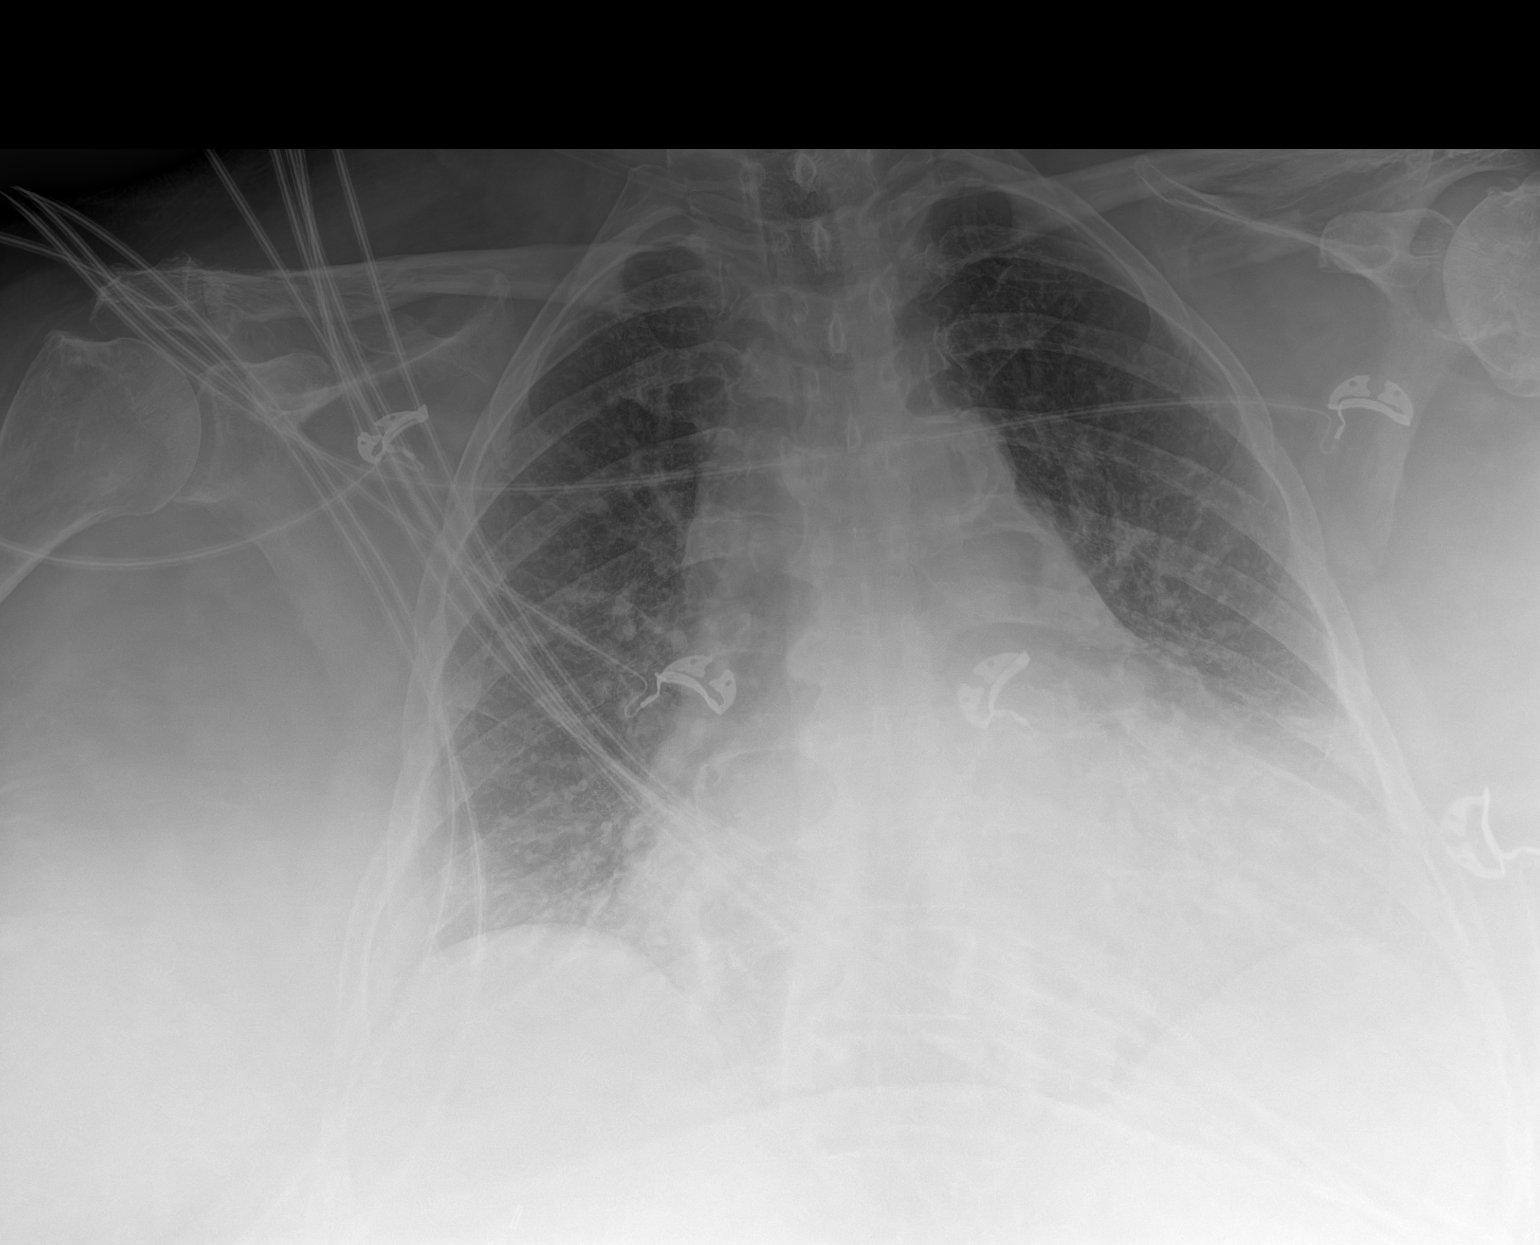

[1 of 1 positions shown; findings below may reference images not displayed]

FINDINGS: Unchanged enlarged cardiac silhouette. There is mild pulmonary
vascular congestion. No large pleural effusion. No visible
pneumothorax. No acute osseous abnormality.
IMPRESSION: Unchanged cardiomegaly with pulmonary vascular congestion.
